# Patient Record
Sex: Female | Born: 1955 | Race: Black or African American | Hispanic: No | Smoking: Never smoker
Health system: Southern US, Community
[De-identification: ages and names within clinical notes are randomized; demographics above are authoritative.]

## PROBLEM LIST (undated history)

## (undated) DIAGNOSIS — K219 Gastro-esophageal reflux disease without esophagitis: Secondary | ICD-10-CM

## (undated) DIAGNOSIS — R0609 Other forms of dyspnea: Secondary | ICD-10-CM

## (undated) DIAGNOSIS — M549 Dorsalgia, unspecified: Secondary | ICD-10-CM

## (undated) DIAGNOSIS — R350 Frequency of micturition: Secondary | ICD-10-CM

## (undated) DIAGNOSIS — F341 Dysthymic disorder: Secondary | ICD-10-CM

## (undated) DIAGNOSIS — G8929 Other chronic pain: Secondary | ICD-10-CM

## (undated) DIAGNOSIS — M1990 Unspecified osteoarthritis, unspecified site: Secondary | ICD-10-CM

## (undated) DIAGNOSIS — E663 Overweight: Secondary | ICD-10-CM

## (undated) DIAGNOSIS — H1010 Acute atopic conjunctivitis, unspecified eye: Secondary | ICD-10-CM

## (undated) DIAGNOSIS — R600 Localized edema: Secondary | ICD-10-CM

## (undated) DIAGNOSIS — E785 Hyperlipidemia, unspecified: Secondary | ICD-10-CM

## (undated) DIAGNOSIS — R0600 Dyspnea, unspecified: Secondary | ICD-10-CM

## (undated) DIAGNOSIS — K5909 Other constipation: Secondary | ICD-10-CM

## (undated) DIAGNOSIS — I639 Cerebral infarction, unspecified: Secondary | ICD-10-CM

## (undated) DIAGNOSIS — I10 Essential (primary) hypertension: Secondary | ICD-10-CM

## (undated) HISTORY — DX: Frequency of micturition: R35.0

## (undated) HISTORY — DX: Dorsalgia, unspecified: M54.9

## (undated) HISTORY — DX: Other forms of dyspnea: R06.09

## (undated) HISTORY — PX: UPPER GASTROINTESTINAL ENDOSCOPY: SHX188

## (undated) HISTORY — DX: Gastro-esophageal reflux disease without esophagitis: K21.9

## (undated) HISTORY — DX: Essential (primary) hypertension: I10

## (undated) HISTORY — DX: Overweight: E66.3

## (undated) HISTORY — DX: Dyspnea, unspecified: R06.00

## (undated) HISTORY — DX: Unspecified osteoarthritis, unspecified site: M19.90

## (undated) HISTORY — DX: Other chronic pain: G89.29

## (undated) HISTORY — DX: Hyperlipidemia, unspecified: E78.5

## (undated) HISTORY — DX: Acute atopic conjunctivitis, unspecified eye: H10.10

## (undated) HISTORY — DX: Localized edema: R60.0

## (undated) HISTORY — DX: Other constipation: K59.09

## (undated) HISTORY — DX: Dysthymic disorder: F34.1

## (undated) HISTORY — DX: Cerebral infarction, unspecified: I63.9

---

## 1997-11-18 ENCOUNTER — Other Ambulatory Visit: Admission: RE | Admit: 1997-11-18 | Discharge: 1997-11-18 | Payer: Self-pay | Admitting: Nephrology

## 1998-01-15 ENCOUNTER — Emergency Department (HOSPITAL_COMMUNITY): Admission: EM | Admit: 1998-01-15 | Discharge: 1998-01-15 | Payer: Self-pay | Admitting: Emergency Medicine

## 1998-07-16 ENCOUNTER — Emergency Department (HOSPITAL_COMMUNITY): Admission: EM | Admit: 1998-07-16 | Discharge: 1998-07-16 | Payer: Self-pay

## 1998-09-15 ENCOUNTER — Emergency Department (HOSPITAL_COMMUNITY): Admission: EM | Admit: 1998-09-15 | Discharge: 1998-09-15 | Payer: Self-pay | Admitting: Emergency Medicine

## 1998-11-12 ENCOUNTER — Emergency Department (HOSPITAL_COMMUNITY): Admission: EM | Admit: 1998-11-12 | Discharge: 1998-11-12 | Payer: Self-pay | Admitting: Emergency Medicine

## 1999-02-22 ENCOUNTER — Encounter: Payer: Self-pay | Admitting: Urology

## 1999-02-23 ENCOUNTER — Ambulatory Visit (HOSPITAL_COMMUNITY): Admission: RE | Admit: 1999-02-23 | Discharge: 1999-02-23 | Payer: Self-pay | Admitting: Urology

## 1999-02-23 ENCOUNTER — Encounter: Payer: Self-pay | Admitting: Urology

## 1999-02-23 ENCOUNTER — Encounter (INDEPENDENT_AMBULATORY_CARE_PROVIDER_SITE_OTHER): Payer: Self-pay | Admitting: Specialist

## 1999-04-13 ENCOUNTER — Encounter: Admission: RE | Admit: 1999-04-13 | Discharge: 1999-04-13 | Payer: Self-pay | Admitting: Internal Medicine

## 1999-04-13 ENCOUNTER — Encounter: Admission: RE | Admit: 1999-04-13 | Discharge: 1999-04-13 | Payer: Self-pay | Admitting: Family Medicine

## 1999-04-13 ENCOUNTER — Ambulatory Visit (HOSPITAL_COMMUNITY): Admission: RE | Admit: 1999-04-13 | Discharge: 1999-04-13 | Payer: Self-pay | Admitting: Family Medicine

## 1999-04-17 ENCOUNTER — Inpatient Hospital Stay (HOSPITAL_COMMUNITY): Admission: AD | Admit: 1999-04-17 | Discharge: 1999-04-19 | Payer: Self-pay | Admitting: Cardiovascular Disease

## 1999-04-17 ENCOUNTER — Encounter: Payer: Self-pay | Admitting: Cardiovascular Disease

## 1999-04-18 ENCOUNTER — Encounter: Payer: Self-pay | Admitting: Cardiovascular Disease

## 1999-07-10 ENCOUNTER — Encounter: Admission: RE | Admit: 1999-07-10 | Discharge: 1999-07-28 | Payer: Self-pay | Admitting: Orthopedic Surgery

## 1999-07-11 ENCOUNTER — Emergency Department (HOSPITAL_COMMUNITY): Admission: EM | Admit: 1999-07-11 | Discharge: 1999-07-11 | Payer: Self-pay | Admitting: Emergency Medicine

## 1999-08-21 ENCOUNTER — Encounter: Admission: RE | Admit: 1999-08-21 | Discharge: 1999-08-21 | Payer: Self-pay | Admitting: Family Medicine

## 1999-09-07 ENCOUNTER — Encounter: Admission: RE | Admit: 1999-09-07 | Discharge: 1999-09-07 | Payer: Self-pay | Admitting: Family Medicine

## 1999-09-28 HISTORY — PX: ABDOMINAL HYSTERECTOMY: SHX81

## 1999-10-24 ENCOUNTER — Inpatient Hospital Stay (HOSPITAL_COMMUNITY): Admission: RE | Admit: 1999-10-24 | Discharge: 1999-10-26 | Payer: Self-pay | Admitting: *Deleted

## 1999-10-24 ENCOUNTER — Encounter (INDEPENDENT_AMBULATORY_CARE_PROVIDER_SITE_OTHER): Payer: Self-pay | Admitting: Specialist

## 2000-01-02 ENCOUNTER — Encounter: Admission: RE | Admit: 2000-01-02 | Discharge: 2000-01-02 | Payer: Self-pay | Admitting: Sports Medicine

## 2000-02-09 ENCOUNTER — Encounter: Admission: RE | Admit: 2000-02-09 | Discharge: 2000-02-09 | Payer: Self-pay | Admitting: Family Medicine

## 2000-03-28 ENCOUNTER — Encounter: Payer: Self-pay | Admitting: Emergency Medicine

## 2000-03-28 ENCOUNTER — Emergency Department (HOSPITAL_COMMUNITY): Admission: EM | Admit: 2000-03-28 | Discharge: 2000-03-28 | Payer: Self-pay | Admitting: Emergency Medicine

## 2000-04-25 ENCOUNTER — Encounter: Admission: RE | Admit: 2000-04-25 | Discharge: 2000-04-25 | Payer: Self-pay | Admitting: Family Medicine

## 2000-04-29 ENCOUNTER — Encounter (INDEPENDENT_AMBULATORY_CARE_PROVIDER_SITE_OTHER): Payer: Self-pay | Admitting: *Deleted

## 2000-04-29 LAB — CONVERTED CEMR LAB

## 2000-05-06 ENCOUNTER — Encounter: Admission: RE | Admit: 2000-05-06 | Discharge: 2000-05-06 | Payer: Self-pay | Admitting: Family Medicine

## 2000-05-20 ENCOUNTER — Other Ambulatory Visit: Admission: RE | Admit: 2000-05-20 | Discharge: 2000-05-20 | Payer: Self-pay | Admitting: Family Medicine

## 2000-05-20 ENCOUNTER — Encounter: Admission: RE | Admit: 2000-05-20 | Discharge: 2000-05-20 | Payer: Self-pay | Admitting: Family Medicine

## 2000-05-23 ENCOUNTER — Emergency Department (HOSPITAL_COMMUNITY): Admission: EM | Admit: 2000-05-23 | Discharge: 2000-05-23 | Payer: Self-pay | Admitting: Emergency Medicine

## 2000-07-09 ENCOUNTER — Ambulatory Visit: Admission: RE | Admit: 2000-07-09 | Discharge: 2000-07-09 | Payer: Self-pay | Admitting: Family Medicine

## 2000-07-09 ENCOUNTER — Encounter: Admission: RE | Admit: 2000-07-09 | Discharge: 2000-07-09 | Payer: Self-pay | Admitting: Family Medicine

## 2000-07-11 ENCOUNTER — Ambulatory Visit (HOSPITAL_COMMUNITY): Admission: RE | Admit: 2000-07-11 | Discharge: 2000-07-11 | Payer: Self-pay | Admitting: Family Medicine

## 2000-07-25 ENCOUNTER — Encounter: Payer: Self-pay | Admitting: Family Medicine

## 2000-07-25 ENCOUNTER — Ambulatory Visit (HOSPITAL_COMMUNITY): Admission: RE | Admit: 2000-07-25 | Discharge: 2000-07-25 | Payer: Self-pay | Admitting: Family Medicine

## 2000-07-31 ENCOUNTER — Ambulatory Visit: Admission: RE | Admit: 2000-07-31 | Discharge: 2000-07-31 | Payer: Self-pay | Admitting: Gynecology

## 2000-10-03 ENCOUNTER — Encounter: Admission: RE | Admit: 2000-10-03 | Discharge: 2000-10-03 | Payer: Self-pay | Admitting: Family Medicine

## 2000-10-07 ENCOUNTER — Encounter: Admission: RE | Admit: 2000-10-07 | Discharge: 2000-10-07 | Payer: Self-pay | Admitting: Family Medicine

## 2000-10-16 ENCOUNTER — Encounter: Admission: RE | Admit: 2000-10-16 | Discharge: 2000-10-16 | Payer: Self-pay | Admitting: Family Medicine

## 2000-10-17 ENCOUNTER — Encounter: Payer: Self-pay | Admitting: Family Medicine

## 2000-10-17 ENCOUNTER — Encounter: Admission: RE | Admit: 2000-10-17 | Discharge: 2000-10-17 | Payer: Self-pay | Admitting: Family Medicine

## 2000-11-08 ENCOUNTER — Ambulatory Visit (HOSPITAL_COMMUNITY): Admission: RE | Admit: 2000-11-08 | Discharge: 2000-11-08 | Payer: Self-pay | Admitting: Cardiovascular Disease

## 2000-11-08 ENCOUNTER — Encounter: Payer: Self-pay | Admitting: Cardiovascular Disease

## 2000-11-14 ENCOUNTER — Emergency Department (HOSPITAL_COMMUNITY): Admission: EM | Admit: 2000-11-14 | Discharge: 2000-11-14 | Payer: Self-pay | Admitting: Emergency Medicine

## 2000-11-14 ENCOUNTER — Encounter: Payer: Self-pay | Admitting: Emergency Medicine

## 2000-11-17 ENCOUNTER — Emergency Department (HOSPITAL_COMMUNITY): Admission: EM | Admit: 2000-11-17 | Discharge: 2000-11-17 | Payer: Self-pay | Admitting: Emergency Medicine

## 2001-01-15 ENCOUNTER — Encounter: Admission: RE | Admit: 2001-01-15 | Discharge: 2001-01-15 | Payer: Self-pay | Admitting: Family Medicine

## 2001-01-31 ENCOUNTER — Ambulatory Visit (HOSPITAL_COMMUNITY): Admission: RE | Admit: 2001-01-31 | Discharge: 2001-01-31 | Payer: Self-pay | Admitting: Family Medicine

## 2001-01-31 ENCOUNTER — Encounter: Payer: Self-pay | Admitting: Family Medicine

## 2001-02-03 ENCOUNTER — Emergency Department (HOSPITAL_COMMUNITY): Admission: EM | Admit: 2001-02-03 | Discharge: 2001-02-03 | Payer: Self-pay | Admitting: Emergency Medicine

## 2001-02-12 ENCOUNTER — Ambulatory Visit: Admission: RE | Admit: 2001-02-12 | Discharge: 2001-02-12 | Payer: Self-pay | Admitting: Gynecology

## 2001-02-14 ENCOUNTER — Encounter: Admission: RE | Admit: 2001-02-14 | Discharge: 2001-02-14 | Payer: Self-pay | Admitting: Family Medicine

## 2001-03-07 ENCOUNTER — Encounter: Admission: RE | Admit: 2001-03-07 | Discharge: 2001-03-07 | Payer: Self-pay | Admitting: Family Medicine

## 2001-03-18 ENCOUNTER — Encounter: Admission: RE | Admit: 2001-03-18 | Discharge: 2001-03-18 | Payer: Self-pay | Admitting: Family Medicine

## 2001-03-24 ENCOUNTER — Emergency Department (HOSPITAL_COMMUNITY): Admission: EM | Admit: 2001-03-24 | Discharge: 2001-03-24 | Payer: Self-pay | Admitting: Emergency Medicine

## 2001-03-30 HISTORY — PX: SMALL INTESTINE SURGERY: SHX150

## 2001-04-07 ENCOUNTER — Encounter: Admission: RE | Admit: 2001-04-07 | Discharge: 2001-04-07 | Payer: Self-pay | Admitting: Family Medicine

## 2001-04-08 ENCOUNTER — Encounter (INDEPENDENT_AMBULATORY_CARE_PROVIDER_SITE_OTHER): Payer: Self-pay | Admitting: Specialist

## 2001-04-08 ENCOUNTER — Inpatient Hospital Stay (HOSPITAL_COMMUNITY): Admission: RE | Admit: 2001-04-08 | Discharge: 2001-04-16 | Payer: Self-pay | Admitting: Gynecology

## 2001-05-07 ENCOUNTER — Encounter: Admission: RE | Admit: 2001-05-07 | Discharge: 2001-05-07 | Payer: Self-pay | Admitting: Family Medicine

## 2001-05-08 ENCOUNTER — Ambulatory Visit (HOSPITAL_COMMUNITY): Admission: RE | Admit: 2001-05-08 | Discharge: 2001-05-08 | Payer: Self-pay | Admitting: Gynecology

## 2001-05-08 HISTORY — PX: BILATERAL SALPINGOOPHORECTOMY: SHX1223

## 2001-06-03 ENCOUNTER — Ambulatory Visit (HOSPITAL_BASED_OUTPATIENT_CLINIC_OR_DEPARTMENT_OTHER): Admission: RE | Admit: 2001-06-03 | Discharge: 2001-06-03 | Payer: Self-pay | Admitting: Urology

## 2001-06-03 ENCOUNTER — Encounter (INDEPENDENT_AMBULATORY_CARE_PROVIDER_SITE_OTHER): Payer: Self-pay

## 2001-06-18 ENCOUNTER — Ambulatory Visit: Admission: RE | Admit: 2001-06-18 | Discharge: 2001-06-18 | Payer: Self-pay | Admitting: Gynecologic Oncology

## 2001-07-31 ENCOUNTER — Encounter: Admission: RE | Admit: 2001-07-31 | Discharge: 2001-07-31 | Payer: Self-pay | Admitting: Family Medicine

## 2001-08-12 ENCOUNTER — Encounter: Admission: RE | Admit: 2001-08-12 | Discharge: 2001-08-12 | Payer: Self-pay

## 2001-09-03 ENCOUNTER — Encounter: Admission: RE | Admit: 2001-09-03 | Discharge: 2001-09-03 | Payer: Self-pay | Admitting: Family Medicine

## 2001-09-10 ENCOUNTER — Encounter: Admission: RE | Admit: 2001-09-10 | Discharge: 2001-09-10 | Payer: Self-pay | Admitting: Family Medicine

## 2001-09-12 ENCOUNTER — Emergency Department (HOSPITAL_COMMUNITY): Admission: EM | Admit: 2001-09-12 | Discharge: 2001-09-12 | Payer: Self-pay | Admitting: Emergency Medicine

## 2001-09-16 ENCOUNTER — Emergency Department (HOSPITAL_COMMUNITY): Admission: EM | Admit: 2001-09-16 | Discharge: 2001-09-16 | Payer: Self-pay | Admitting: *Deleted

## 2001-09-16 ENCOUNTER — Encounter: Payer: Self-pay | Admitting: *Deleted

## 2001-09-30 ENCOUNTER — Encounter: Payer: Self-pay | Admitting: Otolaryngology

## 2001-09-30 ENCOUNTER — Encounter: Admission: RE | Admit: 2001-09-30 | Discharge: 2001-09-30 | Payer: Self-pay | Admitting: Otolaryngology

## 2001-10-01 ENCOUNTER — Ambulatory Visit (HOSPITAL_COMMUNITY): Admission: RE | Admit: 2001-10-01 | Discharge: 2001-10-01 | Payer: Self-pay | Admitting: Otolaryngology

## 2001-10-01 ENCOUNTER — Encounter (INDEPENDENT_AMBULATORY_CARE_PROVIDER_SITE_OTHER): Payer: Self-pay | Admitting: *Deleted

## 2001-10-27 ENCOUNTER — Ambulatory Visit (HOSPITAL_COMMUNITY): Admission: RE | Admit: 2001-10-27 | Discharge: 2001-10-28 | Payer: Self-pay | Admitting: Family Medicine

## 2001-11-18 ENCOUNTER — Encounter: Admission: RE | Admit: 2001-11-18 | Discharge: 2001-11-18 | Payer: Self-pay | Admitting: Internal Medicine

## 2001-12-12 ENCOUNTER — Emergency Department: Admission: EM | Admit: 2001-12-12 | Discharge: 2001-12-12 | Payer: Self-pay | Admitting: Internal Medicine

## 2001-12-22 ENCOUNTER — Emergency Department (HOSPITAL_COMMUNITY): Admission: EM | Admit: 2001-12-22 | Discharge: 2001-12-22 | Payer: Self-pay | Admitting: Emergency Medicine

## 2001-12-23 ENCOUNTER — Encounter: Admission: RE | Admit: 2001-12-23 | Discharge: 2001-12-23 | Payer: Self-pay | Admitting: *Deleted

## 2002-02-12 ENCOUNTER — Encounter: Admission: RE | Admit: 2002-02-12 | Discharge: 2002-02-12 | Payer: Self-pay | Admitting: Family Medicine

## 2002-02-24 ENCOUNTER — Encounter: Admission: RE | Admit: 2002-02-24 | Discharge: 2002-02-24 | Payer: Self-pay | Admitting: Internal Medicine

## 2002-02-27 ENCOUNTER — Encounter: Admission: RE | Admit: 2002-02-27 | Discharge: 2002-02-27 | Payer: Self-pay | Admitting: Family Medicine

## 2002-03-31 ENCOUNTER — Encounter: Admission: RE | Admit: 2002-03-31 | Discharge: 2002-03-31 | Payer: Self-pay | Admitting: Family Medicine

## 2002-05-28 ENCOUNTER — Encounter: Admission: RE | Admit: 2002-05-28 | Discharge: 2002-05-28 | Payer: Self-pay | Admitting: Family Medicine

## 2002-06-16 ENCOUNTER — Encounter: Admission: RE | Admit: 2002-06-16 | Discharge: 2002-06-16 | Payer: Self-pay | Admitting: Family Medicine

## 2002-07-15 ENCOUNTER — Encounter: Admission: RE | Admit: 2002-07-15 | Discharge: 2002-07-15 | Payer: Self-pay | Admitting: Family Medicine

## 2002-07-21 ENCOUNTER — Encounter: Payer: Self-pay | Admitting: Sports Medicine

## 2002-07-21 ENCOUNTER — Encounter: Admission: RE | Admit: 2002-07-21 | Discharge: 2002-07-21 | Payer: Self-pay | Admitting: Sports Medicine

## 2002-08-07 ENCOUNTER — Encounter: Admission: RE | Admit: 2002-08-07 | Discharge: 2002-08-07 | Payer: Self-pay | Admitting: Family Medicine

## 2002-08-13 ENCOUNTER — Encounter: Admission: RE | Admit: 2002-08-13 | Discharge: 2002-08-13 | Payer: Self-pay | Admitting: Family Medicine

## 2002-08-17 ENCOUNTER — Encounter: Admission: RE | Admit: 2002-08-17 | Discharge: 2002-08-17 | Payer: Self-pay | Admitting: Family Medicine

## 2002-09-27 ENCOUNTER — Emergency Department (HOSPITAL_COMMUNITY): Admission: EM | Admit: 2002-09-27 | Discharge: 2002-09-27 | Payer: Self-pay | Admitting: Emergency Medicine

## 2002-09-29 ENCOUNTER — Encounter: Admission: RE | Admit: 2002-09-29 | Discharge: 2002-09-29 | Payer: Self-pay | Admitting: Internal Medicine

## 2002-10-06 ENCOUNTER — Encounter: Admission: RE | Admit: 2002-10-06 | Discharge: 2002-10-06 | Payer: Self-pay | Admitting: Internal Medicine

## 2002-11-05 ENCOUNTER — Encounter: Admission: RE | Admit: 2002-11-05 | Discharge: 2002-11-05 | Payer: Self-pay | Admitting: Internal Medicine

## 2002-11-19 ENCOUNTER — Encounter: Admission: RE | Admit: 2002-11-19 | Discharge: 2002-11-19 | Payer: Self-pay | Admitting: Family Medicine

## 2002-11-20 ENCOUNTER — Encounter: Admission: RE | Admit: 2002-11-20 | Discharge: 2002-11-20 | Payer: Self-pay | Admitting: Internal Medicine

## 2002-12-30 ENCOUNTER — Encounter: Admission: RE | Admit: 2002-12-30 | Discharge: 2002-12-30 | Payer: Self-pay | Admitting: Family Medicine

## 2003-02-15 ENCOUNTER — Encounter: Admission: RE | Admit: 2003-02-15 | Discharge: 2003-02-15 | Payer: Self-pay | Admitting: Sports Medicine

## 2003-02-26 ENCOUNTER — Encounter: Admission: RE | Admit: 2003-02-26 | Discharge: 2003-02-26 | Payer: Self-pay | Admitting: Internal Medicine

## 2003-05-17 ENCOUNTER — Encounter: Admission: RE | Admit: 2003-05-17 | Discharge: 2003-05-17 | Payer: Self-pay | Admitting: Family Medicine

## 2003-08-10 ENCOUNTER — Encounter: Admission: RE | Admit: 2003-08-10 | Discharge: 2003-08-10 | Payer: Self-pay | Admitting: Sports Medicine

## 2003-08-13 ENCOUNTER — Encounter: Admission: RE | Admit: 2003-08-13 | Discharge: 2003-08-13 | Payer: Self-pay | Admitting: Family Medicine

## 2003-09-20 ENCOUNTER — Ambulatory Visit (HOSPITAL_COMMUNITY): Admission: RE | Admit: 2003-09-20 | Discharge: 2003-09-20 | Payer: Self-pay | Admitting: Cardiovascular Disease

## 2003-09-21 ENCOUNTER — Ambulatory Visit (HOSPITAL_BASED_OUTPATIENT_CLINIC_OR_DEPARTMENT_OTHER): Admission: RE | Admit: 2003-09-21 | Discharge: 2003-09-21 | Payer: Self-pay | Admitting: Cardiovascular Disease

## 2003-11-12 ENCOUNTER — Ambulatory Visit (HOSPITAL_BASED_OUTPATIENT_CLINIC_OR_DEPARTMENT_OTHER): Admission: RE | Admit: 2003-11-12 | Discharge: 2003-11-12 | Payer: Self-pay | Admitting: Surgery

## 2003-11-12 ENCOUNTER — Ambulatory Visit (HOSPITAL_COMMUNITY): Admission: RE | Admit: 2003-11-12 | Discharge: 2003-11-12 | Payer: Self-pay | Admitting: Surgery

## 2003-11-19 ENCOUNTER — Ambulatory Visit (HOSPITAL_COMMUNITY): Admission: RE | Admit: 2003-11-19 | Discharge: 2003-11-19 | Payer: Self-pay | Admitting: Sports Medicine

## 2003-11-23 ENCOUNTER — Ambulatory Visit: Admission: RE | Admit: 2003-11-23 | Discharge: 2003-11-23 | Payer: Self-pay | Admitting: Gynecology

## 2003-12-28 ENCOUNTER — Ambulatory Visit (HOSPITAL_BASED_OUTPATIENT_CLINIC_OR_DEPARTMENT_OTHER): Admission: RE | Admit: 2003-12-28 | Discharge: 2003-12-28 | Payer: Self-pay | Admitting: Cardiovascular Disease

## 2003-12-29 ENCOUNTER — Encounter: Admission: RE | Admit: 2003-12-29 | Discharge: 2003-12-29 | Payer: Self-pay | Admitting: Family Medicine

## 2003-12-29 HISTORY — PX: BREAST SURGERY: SHX581

## 2004-01-04 ENCOUNTER — Ambulatory Visit (HOSPITAL_COMMUNITY): Admission: RE | Admit: 2004-01-04 | Discharge: 2004-01-04 | Payer: Self-pay | Admitting: *Deleted

## 2004-01-06 ENCOUNTER — Encounter: Admission: RE | Admit: 2004-01-06 | Discharge: 2004-01-06 | Payer: Self-pay | Admitting: General Surgery

## 2004-01-06 ENCOUNTER — Encounter (INDEPENDENT_AMBULATORY_CARE_PROVIDER_SITE_OTHER): Payer: Self-pay | Admitting: *Deleted

## 2004-02-18 ENCOUNTER — Encounter: Admission: RE | Admit: 2004-02-18 | Discharge: 2004-02-18 | Payer: Self-pay | Admitting: Family Medicine

## 2004-03-17 ENCOUNTER — Encounter: Admission: RE | Admit: 2004-03-17 | Discharge: 2004-03-17 | Payer: Self-pay | Admitting: Family Medicine

## 2004-06-06 ENCOUNTER — Ambulatory Visit: Payer: Self-pay | Admitting: Family Medicine

## 2004-07-03 ENCOUNTER — Ambulatory Visit: Payer: Self-pay | Admitting: Family Medicine

## 2004-07-27 ENCOUNTER — Ambulatory Visit: Payer: Self-pay | Admitting: Family Medicine

## 2004-09-15 ENCOUNTER — Ambulatory Visit: Payer: Self-pay | Admitting: Family Medicine

## 2004-11-21 ENCOUNTER — Ambulatory Visit (HOSPITAL_COMMUNITY): Admission: RE | Admit: 2004-11-21 | Discharge: 2004-11-21 | Payer: Self-pay | Admitting: Family Medicine

## 2004-12-28 LAB — HM COLONOSCOPY: HM Colonoscopy: NORMAL

## 2005-01-02 ENCOUNTER — Ambulatory Visit (HOSPITAL_COMMUNITY): Admission: RE | Admit: 2005-01-02 | Discharge: 2005-01-02 | Payer: Self-pay | Admitting: Gastroenterology

## 2005-03-09 ENCOUNTER — Ambulatory Visit: Payer: Self-pay | Admitting: Family Medicine

## 2005-04-26 ENCOUNTER — Ambulatory Visit: Payer: Self-pay | Admitting: Family Medicine

## 2005-06-15 ENCOUNTER — Ambulatory Visit: Payer: Self-pay | Admitting: Family Medicine

## 2005-06-28 ENCOUNTER — Ambulatory Visit (HOSPITAL_COMMUNITY): Admission: RE | Admit: 2005-06-28 | Discharge: 2005-06-28 | Payer: Self-pay | Admitting: Family Medicine

## 2005-08-03 ENCOUNTER — Ambulatory Visit: Payer: Self-pay | Admitting: Family Medicine

## 2005-08-28 ENCOUNTER — Ambulatory Visit: Payer: Self-pay | Admitting: Sports Medicine

## 2005-09-10 ENCOUNTER — Ambulatory Visit: Payer: Self-pay | Admitting: Family Medicine

## 2005-09-17 ENCOUNTER — Emergency Department (HOSPITAL_COMMUNITY): Admission: EM | Admit: 2005-09-17 | Discharge: 2005-09-17 | Payer: Self-pay | Admitting: Emergency Medicine

## 2005-11-23 ENCOUNTER — Ambulatory Visit (HOSPITAL_COMMUNITY): Admission: RE | Admit: 2005-11-23 | Discharge: 2005-11-23 | Payer: Self-pay | Admitting: Family Medicine

## 2006-03-08 ENCOUNTER — Ambulatory Visit: Payer: Self-pay | Admitting: Family Medicine

## 2006-06-28 ENCOUNTER — Ambulatory Visit: Payer: Self-pay | Admitting: Family Medicine

## 2006-08-01 ENCOUNTER — Ambulatory Visit: Payer: Self-pay | Admitting: Family Medicine

## 2006-09-27 ENCOUNTER — Encounter (INDEPENDENT_AMBULATORY_CARE_PROVIDER_SITE_OTHER): Payer: Self-pay | Admitting: *Deleted

## 2006-10-09 ENCOUNTER — Encounter (INDEPENDENT_AMBULATORY_CARE_PROVIDER_SITE_OTHER): Payer: Self-pay | Admitting: Family Medicine

## 2006-10-09 ENCOUNTER — Ambulatory Visit: Payer: Self-pay | Admitting: Sports Medicine

## 2006-10-09 DIAGNOSIS — M545 Low back pain: Secondary | ICD-10-CM

## 2006-10-09 DIAGNOSIS — G8929 Other chronic pain: Secondary | ICD-10-CM

## 2006-10-09 DIAGNOSIS — I10 Essential (primary) hypertension: Secondary | ICD-10-CM | POA: Insufficient documentation

## 2006-10-09 DIAGNOSIS — R3915 Urgency of urination: Secondary | ICD-10-CM

## 2006-10-09 LAB — CONVERTED CEMR LAB
Bilirubin Urine: NEGATIVE
Calcium: 9.3 mg/dL (ref 8.4–10.5)
Chloride: 102 meq/L (ref 96–112)
Potassium: 3.9 meq/L (ref 3.5–5.3)
Urobilinogen, UA: 1
WBC Urine, dipstick: NEGATIVE

## 2006-10-16 ENCOUNTER — Encounter (INDEPENDENT_AMBULATORY_CARE_PROVIDER_SITE_OTHER): Payer: Self-pay | Admitting: Family Medicine

## 2006-11-11 ENCOUNTER — Telehealth: Payer: Self-pay | Admitting: *Deleted

## 2006-12-27 ENCOUNTER — Ambulatory Visit (HOSPITAL_COMMUNITY): Admission: RE | Admit: 2006-12-27 | Discharge: 2006-12-27 | Payer: Self-pay | Admitting: Family Medicine

## 2007-01-20 ENCOUNTER — Telehealth (INDEPENDENT_AMBULATORY_CARE_PROVIDER_SITE_OTHER): Payer: Self-pay | Admitting: *Deleted

## 2007-01-20 ENCOUNTER — Ambulatory Visit: Payer: Self-pay | Admitting: Sports Medicine

## 2007-01-20 LAB — CONVERTED CEMR LAB
Bilirubin Urine: NEGATIVE
Blood in Urine, dipstick: NEGATIVE
Glucose, Urine, Semiquant: NEGATIVE
Ketones, urine, test strip: NEGATIVE
Protein, U semiquant: NEGATIVE
Urobilinogen, UA: 0.2

## 2007-03-07 ENCOUNTER — Ambulatory Visit: Payer: Self-pay | Admitting: Family Medicine

## 2007-03-18 ENCOUNTER — Encounter: Admission: RE | Admit: 2007-03-18 | Discharge: 2007-03-18 | Payer: Self-pay | Admitting: Family Medicine

## 2007-04-17 ENCOUNTER — Telehealth (INDEPENDENT_AMBULATORY_CARE_PROVIDER_SITE_OTHER): Payer: Self-pay | Admitting: Family Medicine

## 2007-04-24 ENCOUNTER — Encounter (INDEPENDENT_AMBULATORY_CARE_PROVIDER_SITE_OTHER): Payer: Self-pay | Admitting: Family Medicine

## 2007-04-24 ENCOUNTER — Ambulatory Visit: Payer: Self-pay | Admitting: Family Medicine

## 2007-04-24 DIAGNOSIS — R079 Chest pain, unspecified: Secondary | ICD-10-CM

## 2007-04-24 LAB — CONVERTED CEMR LAB
AST: 27 units/L (ref 0–37)
Alkaline Phosphatase: 75 units/L (ref 39–117)
BUN: 11 mg/dL (ref 6–23)
CO2: 19 meq/L (ref 19–32)
Calcium: 9 mg/dL (ref 8.4–10.5)
Chloride: 105 meq/L (ref 96–112)
Cholesterol: 151 mg/dL (ref 0–200)
Creatinine, Ser: 0.88 mg/dL (ref 0.40–1.20)
Glucose, Bld: 99 mg/dL (ref 70–99)
LDL Cholesterol: 74 mg/dL (ref 0–99)
Total Bilirubin: 0.7 mg/dL (ref 0.3–1.2)
Total CHOL/HDL Ratio: 2.4
VLDL: 14 mg/dL (ref 0–40)

## 2007-04-27 ENCOUNTER — Encounter (INDEPENDENT_AMBULATORY_CARE_PROVIDER_SITE_OTHER): Payer: Self-pay | Admitting: Family Medicine

## 2007-05-06 ENCOUNTER — Encounter (INDEPENDENT_AMBULATORY_CARE_PROVIDER_SITE_OTHER): Payer: Self-pay | Admitting: Family Medicine

## 2007-05-12 ENCOUNTER — Encounter (INDEPENDENT_AMBULATORY_CARE_PROVIDER_SITE_OTHER): Payer: Self-pay | Admitting: Family Medicine

## 2007-05-16 ENCOUNTER — Telehealth: Payer: Self-pay | Admitting: *Deleted

## 2007-05-19 ENCOUNTER — Ambulatory Visit (HOSPITAL_COMMUNITY): Admission: RE | Admit: 2007-05-19 | Discharge: 2007-05-19 | Payer: Self-pay | Admitting: Family Medicine

## 2007-05-19 ENCOUNTER — Ambulatory Visit: Payer: Self-pay | Admitting: Family Medicine

## 2007-05-20 ENCOUNTER — Telehealth (INDEPENDENT_AMBULATORY_CARE_PROVIDER_SITE_OTHER): Payer: Self-pay | Admitting: Family Medicine

## 2007-05-21 ENCOUNTER — Encounter (INDEPENDENT_AMBULATORY_CARE_PROVIDER_SITE_OTHER): Payer: Self-pay | Admitting: Family Medicine

## 2007-05-28 ENCOUNTER — Encounter (INDEPENDENT_AMBULATORY_CARE_PROVIDER_SITE_OTHER): Payer: Self-pay | Admitting: Family Medicine

## 2007-06-04 ENCOUNTER — Telehealth: Payer: Self-pay | Admitting: *Deleted

## 2007-06-05 ENCOUNTER — Ambulatory Visit: Payer: Self-pay | Admitting: Family Medicine

## 2007-07-07 ENCOUNTER — Encounter (INDEPENDENT_AMBULATORY_CARE_PROVIDER_SITE_OTHER): Payer: Self-pay | Admitting: Family Medicine

## 2007-07-28 ENCOUNTER — Encounter (INDEPENDENT_AMBULATORY_CARE_PROVIDER_SITE_OTHER): Payer: Self-pay | Admitting: Family Medicine

## 2007-10-14 ENCOUNTER — Ambulatory Visit: Payer: Self-pay | Admitting: Family Medicine

## 2007-10-14 ENCOUNTER — Encounter (INDEPENDENT_AMBULATORY_CARE_PROVIDER_SITE_OTHER): Payer: Self-pay | Admitting: Family Medicine

## 2007-10-15 ENCOUNTER — Telehealth (INDEPENDENT_AMBULATORY_CARE_PROVIDER_SITE_OTHER): Payer: Self-pay | Admitting: Family Medicine

## 2007-10-15 LAB — CONVERTED CEMR LAB
Alkaline Phosphatase: 75 units/L (ref 39–117)
BUN: 13 mg/dL (ref 6–23)
Glucose, Bld: 89 mg/dL (ref 70–99)
MCHC: 31.2 g/dL (ref 30.0–36.0)
MCV: 87.2 fL (ref 78.0–100.0)
Potassium: 3.4 meq/L — ABNORMAL LOW (ref 3.5–5.3)
RBC: 4.37 M/uL (ref 3.87–5.11)
Total Protein: 7.8 g/dL (ref 6.0–8.3)
WBC: 6.4 10*3/uL (ref 4.0–10.5)

## 2007-10-16 ENCOUNTER — Ambulatory Visit: Payer: Self-pay | Admitting: Family Medicine

## 2007-12-02 ENCOUNTER — Encounter: Payer: Self-pay | Admitting: *Deleted

## 2007-12-03 ENCOUNTER — Telehealth: Payer: Self-pay | Admitting: *Deleted

## 2007-12-05 ENCOUNTER — Telehealth: Payer: Self-pay | Admitting: *Deleted

## 2008-01-09 ENCOUNTER — Ambulatory Visit (HOSPITAL_COMMUNITY): Admission: RE | Admit: 2008-01-09 | Discharge: 2008-01-09 | Payer: Self-pay | Admitting: Family Medicine

## 2008-02-06 ENCOUNTER — Encounter (INDEPENDENT_AMBULATORY_CARE_PROVIDER_SITE_OTHER): Payer: Self-pay | Admitting: Family Medicine

## 2008-02-06 ENCOUNTER — Ambulatory Visit: Payer: Self-pay | Admitting: Family Medicine

## 2008-02-06 DIAGNOSIS — K219 Gastro-esophageal reflux disease without esophagitis: Secondary | ICD-10-CM

## 2008-02-06 DIAGNOSIS — F341 Dysthymic disorder: Secondary | ICD-10-CM

## 2008-02-06 LAB — CONVERTED CEMR LAB
BUN: 15 mg/dL (ref 6–23)
CO2: 22 meq/L (ref 19–32)
Calcium: 9 mg/dL (ref 8.4–10.5)
Creatinine, Ser: 0.83 mg/dL (ref 0.40–1.20)
Glucose, Bld: 91 mg/dL (ref 70–99)
Sodium: 141 meq/L (ref 135–145)

## 2008-02-11 ENCOUNTER — Telehealth (INDEPENDENT_AMBULATORY_CARE_PROVIDER_SITE_OTHER): Payer: Self-pay | Admitting: Family Medicine

## 2008-02-23 ENCOUNTER — Telehealth: Payer: Self-pay | Admitting: *Deleted

## 2008-03-25 ENCOUNTER — Encounter (INDEPENDENT_AMBULATORY_CARE_PROVIDER_SITE_OTHER): Payer: Self-pay | Admitting: Family Medicine

## 2008-03-25 ENCOUNTER — Ambulatory Visit: Payer: Self-pay | Admitting: Family Medicine

## 2008-07-20 ENCOUNTER — Ambulatory Visit: Payer: Self-pay | Admitting: Family Medicine

## 2008-07-26 ENCOUNTER — Telehealth (INDEPENDENT_AMBULATORY_CARE_PROVIDER_SITE_OTHER): Payer: Self-pay | Admitting: Family Medicine

## 2008-09-10 ENCOUNTER — Encounter (INDEPENDENT_AMBULATORY_CARE_PROVIDER_SITE_OTHER): Payer: Self-pay | Admitting: Family Medicine

## 2008-09-10 ENCOUNTER — Ambulatory Visit: Payer: Self-pay | Admitting: Family Medicine

## 2008-09-10 DIAGNOSIS — H1045 Other chronic allergic conjunctivitis: Secondary | ICD-10-CM

## 2008-09-12 LAB — CONVERTED CEMR LAB
BUN: 12 mg/dL (ref 6–23)
CO2: 21 meq/L (ref 19–32)
Chloride: 106 meq/L (ref 96–112)
Sodium: 141 meq/L (ref 135–145)

## 2008-12-29 ENCOUNTER — Telehealth (INDEPENDENT_AMBULATORY_CARE_PROVIDER_SITE_OTHER): Payer: Self-pay | Admitting: *Deleted

## 2009-01-12 ENCOUNTER — Ambulatory Visit: Payer: Self-pay | Admitting: Family Medicine

## 2009-01-20 ENCOUNTER — Ambulatory Visit (HOSPITAL_COMMUNITY): Admission: RE | Admit: 2009-01-20 | Discharge: 2009-01-20 | Payer: Self-pay | Admitting: Family Medicine

## 2009-01-24 ENCOUNTER — Emergency Department (HOSPITAL_COMMUNITY): Admission: EM | Admit: 2009-01-24 | Discharge: 2009-01-24 | Payer: Self-pay | Admitting: Emergency Medicine

## 2009-02-10 ENCOUNTER — Ambulatory Visit: Payer: Self-pay | Admitting: Family Medicine

## 2009-02-10 ENCOUNTER — Encounter: Payer: Self-pay | Admitting: *Deleted

## 2009-02-10 DIAGNOSIS — K5909 Other constipation: Secondary | ICD-10-CM

## 2009-02-15 ENCOUNTER — Ambulatory Visit: Payer: Self-pay | Admitting: Family Medicine

## 2009-02-15 ENCOUNTER — Encounter: Payer: Self-pay | Admitting: Family Medicine

## 2009-02-23 LAB — CONVERTED CEMR LAB
HDL: 50 mg/dL (ref >39)
Triglycerides: 81 mg/dL (ref <150)

## 2009-02-24 ENCOUNTER — Telehealth: Payer: Self-pay | Admitting: Family Medicine

## 2009-03-02 ENCOUNTER — Ambulatory Visit: Payer: Self-pay | Admitting: Family Medicine

## 2009-04-01 ENCOUNTER — Encounter (INDEPENDENT_AMBULATORY_CARE_PROVIDER_SITE_OTHER): Payer: Self-pay | Admitting: *Deleted

## 2009-04-05 ENCOUNTER — Telehealth: Payer: Self-pay | Admitting: *Deleted

## 2009-04-07 ENCOUNTER — Ambulatory Visit: Payer: Self-pay | Admitting: Family Medicine

## 2009-04-25 ENCOUNTER — Telehealth: Payer: Self-pay | Admitting: Family Medicine

## 2009-04-29 ENCOUNTER — Encounter: Payer: Self-pay | Admitting: Family Medicine

## 2009-05-05 ENCOUNTER — Telehealth: Payer: Self-pay | Admitting: *Deleted

## 2009-05-05 ENCOUNTER — Ambulatory Visit: Payer: Self-pay | Admitting: Cardiology

## 2009-05-05 DIAGNOSIS — E663 Overweight: Secondary | ICD-10-CM | POA: Insufficient documentation

## 2009-05-09 ENCOUNTER — Telehealth: Payer: Self-pay | Admitting: *Deleted

## 2009-05-17 ENCOUNTER — Telehealth: Payer: Self-pay | Admitting: Family Medicine

## 2009-05-17 ENCOUNTER — Telehealth: Payer: Self-pay | Admitting: Cardiology

## 2009-05-18 ENCOUNTER — Telehealth (INDEPENDENT_AMBULATORY_CARE_PROVIDER_SITE_OTHER): Payer: Self-pay | Admitting: *Deleted

## 2009-05-19 ENCOUNTER — Ambulatory Visit: Payer: Self-pay | Admitting: Cardiology

## 2009-05-19 ENCOUNTER — Encounter (HOSPITAL_COMMUNITY): Admission: RE | Admit: 2009-05-19 | Discharge: 2009-07-27 | Payer: Self-pay | Admitting: Cardiology

## 2009-05-19 ENCOUNTER — Ambulatory Visit: Payer: Self-pay

## 2009-05-27 ENCOUNTER — Encounter: Payer: Self-pay | Admitting: Family Medicine

## 2009-05-27 ENCOUNTER — Ambulatory Visit: Payer: Self-pay | Admitting: Family Medicine

## 2009-05-27 DIAGNOSIS — R0989 Other specified symptoms and signs involving the circulatory and respiratory systems: Secondary | ICD-10-CM | POA: Insufficient documentation

## 2009-05-27 DIAGNOSIS — R0609 Other forms of dyspnea: Secondary | ICD-10-CM | POA: Insufficient documentation

## 2009-05-27 LAB — CONVERTED CEMR LAB
HCT: 38.1 % (ref 36.0–46.0)
MCHC: 32.3 g/dL (ref 30.0–36.0)
Pro B Natriuretic peptide (BNP): 22.8 pg/mL (ref 0.0–100.0)
RDW: 13.8 % (ref 11.5–15.5)
WBC: 6.2 10*3/uL (ref 4.0–10.5)

## 2009-05-31 ENCOUNTER — Telehealth: Payer: Self-pay | Admitting: Family Medicine

## 2009-06-01 ENCOUNTER — Telehealth: Payer: Self-pay | Admitting: Family Medicine

## 2009-06-02 ENCOUNTER — Encounter: Payer: Self-pay | Admitting: Family Medicine

## 2009-06-10 ENCOUNTER — Encounter: Payer: Self-pay | Admitting: Family Medicine

## 2009-06-10 ENCOUNTER — Ambulatory Visit: Payer: Self-pay | Admitting: Family Medicine

## 2009-06-10 LAB — CONVERTED CEMR LAB
BUN: 13 mg/dL (ref 6–23)
Calcium: 8.9 mg/dL (ref 8.4–10.5)
Creatinine, Ser: 1 mg/dL (ref 0.40–1.20)

## 2009-06-28 ENCOUNTER — Ambulatory Visit: Payer: Self-pay | Admitting: Family Medicine

## 2009-07-19 ENCOUNTER — Ambulatory Visit: Payer: Self-pay | Admitting: Family Medicine

## 2009-08-18 ENCOUNTER — Ambulatory Visit: Payer: Self-pay | Admitting: Family Medicine

## 2009-08-25 ENCOUNTER — Ambulatory Visit: Payer: Self-pay | Admitting: Family Medicine

## 2009-08-25 DIAGNOSIS — H5210 Myopia, unspecified eye: Secondary | ICD-10-CM

## 2009-08-25 DIAGNOSIS — M545 Low back pain: Secondary | ICD-10-CM

## 2009-08-29 ENCOUNTER — Telehealth: Payer: Self-pay | Admitting: Family Medicine

## 2009-09-02 ENCOUNTER — Encounter: Payer: Self-pay | Admitting: Family Medicine

## 2009-09-02 ENCOUNTER — Ambulatory Visit: Payer: Self-pay | Admitting: Family Medicine

## 2009-09-02 LAB — CONVERTED CEMR LAB
CO2: 22 meq/L (ref 19–32)
Glucose, Bld: 88 mg/dL (ref 70–99)
Sodium: 138 meq/L (ref 135–145)

## 2009-09-07 ENCOUNTER — Telehealth (INDEPENDENT_AMBULATORY_CARE_PROVIDER_SITE_OTHER): Payer: Self-pay | Admitting: *Deleted

## 2009-09-15 ENCOUNTER — Ambulatory Visit: Payer: Self-pay | Admitting: Family Medicine

## 2009-10-03 ENCOUNTER — Encounter: Admission: RE | Admit: 2009-10-03 | Discharge: 2010-01-01 | Payer: Self-pay | Admitting: Family Medicine

## 2009-11-02 ENCOUNTER — Ambulatory Visit: Payer: Self-pay | Admitting: Family Medicine

## 2009-11-08 ENCOUNTER — Telehealth: Payer: Self-pay | Admitting: *Deleted

## 2009-11-21 ENCOUNTER — Telehealth: Payer: Self-pay | Admitting: Family Medicine

## 2009-12-20 ENCOUNTER — Telehealth: Payer: Self-pay | Admitting: Family Medicine

## 2009-12-27 ENCOUNTER — Encounter: Payer: Self-pay | Admitting: Family Medicine

## 2010-01-26 ENCOUNTER — Ambulatory Visit (HOSPITAL_COMMUNITY): Admission: RE | Admit: 2010-01-26 | Discharge: 2010-01-26 | Payer: Self-pay | Admitting: Family Medicine

## 2010-01-26 LAB — HM MAMMOGRAPHY: HM Mammogram: NEGATIVE

## 2010-03-21 ENCOUNTER — Ambulatory Visit: Payer: Self-pay | Admitting: Family Medicine

## 2010-04-06 ENCOUNTER — Ambulatory Visit: Payer: Self-pay | Admitting: Family Medicine

## 2010-04-10 ENCOUNTER — Telehealth: Payer: Self-pay | Admitting: Family Medicine

## 2010-06-02 ENCOUNTER — Telehealth: Payer: Self-pay | Admitting: Family Medicine

## 2010-07-05 ENCOUNTER — Ambulatory Visit: Payer: Self-pay | Admitting: Family Medicine

## 2010-07-05 ENCOUNTER — Telehealth: Payer: Self-pay | Admitting: Family Medicine

## 2010-07-05 DIAGNOSIS — N760 Acute vaginitis: Secondary | ICD-10-CM | POA: Insufficient documentation

## 2010-07-05 LAB — CONVERTED CEMR LAB
Nitrite: NEGATIVE
Specific Gravity, Urine: >=1.03
pH: 5.5

## 2010-07-17 ENCOUNTER — Ambulatory Visit: Payer: Self-pay | Admitting: Family Medicine

## 2010-08-23 ENCOUNTER — Ambulatory Visit: Admission: RE | Admit: 2010-08-23 | Discharge: 2010-08-23 | Payer: Self-pay | Source: Home / Self Care

## 2010-08-23 DIAGNOSIS — L851 Acquired keratosis [keratoderma] palmaris et plantaris: Secondary | ICD-10-CM | POA: Insufficient documentation

## 2010-08-29 NOTE — Consult Note (Signed)
Summary: Dermatology   Dermatology   Imported By: Clydell Hakim 01/26/2010 16:16:48  _____________________________________________________________________  External Attachment:    Type:   Image     Comment:   External Document

## 2010-08-29 NOTE — Progress Notes (Signed)
Summary: Order Needed  Phone Note Call from Patient Call back at Home Phone [phone]   Caller: Patient Summary of Call: Pt has appt for mammogram on June 30 and needs order faxed over from [place] for this.  She is having it at [facility]. Initial call taken by: Clydell Hakim,  Dec 20, 2009 10:29 AM    entered an order in the computer for this.  please print and fax.  Ellery Plunk MD  Dec 21, 2009 6:24 PM  Appended Document: Order Needed    Clinical Lists Changes  Medications: Added new medication of MIRALAX  POWD (POLYETHYLENE GLYCOL 3350) 1 cap in 8 oz of water once daily or up to 4 times per day as needed for constipation - Signed Rx of MIRALAX  POWD (POLYETHYLENE GLYCOL 3350) 1 cap in 8 oz of water once daily or up to 4 times per day as needed for constipation;  #1 x 11;  Signed;  Entered by: Ellery Plunk MD;  Authorized by: Ellery Plunk MD;  Method used: [facility] [address] [phone]*, [address] [place], [place], [place]  [address], Ph: [phone] or [phone], Fax: [phone]    Prescriptions: MIRALAX  POWD (POLYETHYLENE GLYCOL 3350) 1 cap in 8 oz of water once daily or up to 4 times per day as needed for constipation  #1 x 11   Entered and Authorized by:   Ellery Plunk MD   Signed by:   Ellery Plunk MD on 12/23/2009   Method used:   [facility] to        [facility] [address] [phone]* (retail)       [address]       [place], [place]  [address]       Ph: [phone] or [phone]       Fax: [phone]   RxID:   [id number]

## 2010-08-29 NOTE — Assessment & Plan Note (Signed)
Summary: vaginal irritation   Vital Signs:  Patient profile:   55 year old female Weight:      202 pounds Temp:     98.7 degrees F oral Pulse rate:   78 / minute Pulse rhythm:   regular BP sitting:   164 / 90  (left arm) Cuff size:   regular  Vitals Entered By: Loralee Pacas CMA (July 05, 2010 8:49 AM)  Serial Vital Signs/Assessments:  Time      Position  BP       Pulse  Resp  Temp     By 9:08 AM             155/90                         Angelena Sole MD  CC: vaginal itching   Primary Care Provider:  Ellery Plunk MD  CC:  vaginal itching.  History of Present Illness: 1. Vaginal itching: - Pt has had this for about 1 week - She has had a yeast infection before and she thinks that it is that - She is not having any vaginal discharge or bleeding - Denies being sexually active  ROS: denies urinary frequency, dysuria, fevers.  She is complaining of chronic back pain but nothing acutely worse.  2. HTN - She is taking her medicines as prescribed. - She doesn't check her blood pressure at home regularly  ROS: denies chest pain, shortness of breath.  Current Medications (verified): 1)  Aspirin 81 Mg Tbec (Aspirin) .Marland Kitchen.. 1 By Mouth Daily 2)  Nitroglycerin 0.4 Mg Subl (Nitroglycerin) .... Place 1 Tablet Under Tongue As Directed 3)  Patanol 0.1 % Soln (Olopatadine Hcl) .Marland Kitchen.. 1 Drop Each Eye Two Times A Day As Needed For Allergy Symptoms - Disp 1 Bottle 4)  Carvedilol 25 Mg Tabs (Carvedilol) .... One By Mouth Two Times A Day 5)  Triamcinolone Acetonide 0.1 % Crea (Triamcinolone Acetonide) .... Use Sparingly Over Affected Areas Twice Daily 6)  Celexa 20 Mg Tabs (Citalopram Hydrobromide) .... Take One Daily For Depression 7)  Aciphex 20 Mg Tbec (Rabeprazole Sodium) .... Take One Daily 8)  Lisinopril 20 Mg Tabs (Lisinopril) .... Take One Daily  Allergies: 1)  Codeine Phosphate (Codeine Phosphate)  Past History:  Past Medical History: Reviewed history from 05/27/2009  and no changes required. Myoview 10/10- EF of 71%, normal Myoview 10/08 - normal (Dr. Allyson Sabal [facility]) TEE - No evidence ascending aortic aneurysm or significant AR - 6/05 Persantine Cardiolyte - no significant ischemia (low-risk) - 2/05 Cath - nl coronary arteries, nl LVF, ruptured sinus aneurism and mod AI - 11/16/2000 Echo - trivial AR EF 70% - 10/28/2000 ETT Adequate & Positive - 07/12/2000 --> Normal cath 2002 Cardiolyte - neg,  EF 59% - 03/31/1999 Alllergic conjunctivitis Asymetric R LE edema - Dopplers neg Frequent c/o dysuria- volume contracted in past, s/p trial Elmiron HIV NR 7/05 Surgical menopause  Social History: Reviewed history from 05/05/2009 and no changes required. Single.  Lives alone no ETOH/tob/drugs.  Unemployed.  Mother deceased 03/14/2023. Never smoked cigs  Physical Exam  General:  Vitals reviewed and rechecked.  Hypertensive.  comfortable appearing, no acute distress. Eyes:  vision grossly intact.  PERRL.  EOMI. Lungs:  Normal respiratory effort, chest expands symmetrically. Lungs are clear to auscultation, no crackles or wheezes. Heart:  Normal rate and regular rhythm. S1 and S2 normal without gallop, murmur, click, rub or other extra sounds. Abdomen:  soft,  non-tender, normal bowel sounds, no distention, no masses, and no guarding.   Genitalia:  Pt refuses genital exam or wet prep Extremities:  no LE edema Skin:  no rashes.   Psych:  not anxious appearing and not depressed appearing.     Impression & Recommendations:  Problem # 1:  UNSPECIFIED VAGINITIS AND VULVOVAGINITIS (ICD-616.10) Assessment New  Pt with trich on UA.  She refuses a genital exam or wet prep.  She would likely benefit from screening for other STDs including GC/Chlamydia.  Will treat her with one time dose of Flagyl.  Sent in prescription for Monistat before results were back.  Advised her that she didn't need to take that.  Her updated medication list for this problem includes:    Monistat 7 100  Mg Supp (Miconazole nitrate) ..... Insert 1 suppository at bedtime daily for 1 week    Metronidazole 500 Mg Tabs (Metronidazole) .Marland KitchenMarland KitchenMarland KitchenMarland Kitchen 4 tabs by mouth x 1  Orders: [facility]- Est  Level 4 ([address]409)  Problem # 2:  HYPERTENSION, BENIGN (ICD-401.1) Assessment: Deteriorated  Not at goal.  She states that she is taking her medicine as prescribed.  Has room to go up on her Lisinopril.  Will have her come back for appointment with PCP next week to address adjusting medications. Her updated medication list for this problem includes:    Carvedilol 25 Mg Tabs (Carvedilol) ..... One by mouth two times a day    Lisinopril 20 Mg Tabs (Lisinopril) .Marland Kitchen... Take one daily  Orders: [facility]- Est  Level 4 ([address]191)  Complete Medication List: 1)  Aspirin 81 Mg Tbec (Aspirin) .Marland Kitchen.. 1 by mouth daily 2)  Nitroglycerin 0.4 Mg Subl (Nitroglycerin) .... Place 1 tablet under tongue as directed 3)  Patanol 0.1 % Soln (Olopatadine hcl) .Marland Kitchen.. 1 drop each eye two times a day as needed for allergy symptoms - disp 1 bottle 4)  Carvedilol 25 Mg Tabs (Carvedilol) .... One by mouth two times a day 5)  Triamcinolone Acetonide 0.1 % Crea (Triamcinolone acetonide) .... Use sparingly over affected areas twice daily 6)  Celexa 20 Mg Tabs (Citalopram hydrobromide) .... Take one daily for depression 7)  Aciphex 20 Mg Tbec (Rabeprazole sodium) .... Take one daily 8)  Lisinopril 20 Mg Tabs (Lisinopril) .... Take one daily 9)  Monistat 7 100 Mg Supp (Miconazole nitrate) .... Insert 1 suppository at bedtime daily for 1 week 10)  Metronidazole 500 Mg Tabs (Metronidazole) .... 4 tabs by mouth x 1  Other Orders: Urinalysis-[facility] (00000)  Patient Instructions: 1)  We will treat you for trich 2)  I have sent in a prescription for flagyl 3)  If not better with this then please return to clinic and they will have to do a wet prep then 4)  I would also like for you to schedule an appointment in 1 week with your regular doctor to discuss your blood  pressure Prescriptions: METRONIDAZOLE 500 MG TABS (METRONIDAZOLE) 4 tabs by mouth x 1  #4 x 0   Entered and Authorized by:   Angelena Sole MD   Signed by:   Angelena Sole MD on 07/05/2010   Method used:   [facility] to        [facility] [address] [phone]* (retail)       [address]       [place], [place]  [address]       Ph: [phone] or [phone]  Fax: [phone]   RxID:   [id number] MONISTAT 7 100 MG SUPP (MICONAZOLE NITRATE) Insert 1 suppository at bedtime daily for 1 week  #7 x 0   Entered and Authorized by:   Angelena Sole MD   Signed by:   Angelena Sole MD on 07/05/2010   Method used:   Electronically to        [facility] [address] [phone]* (retail)       [address]       [place], [place]  [address]       Ph: [phone] or [phone]       Fax: [phone]   RxID:   [id number]    Orders Added: 1)  Urinalysis-[facility] [00000] 2)  [facility]- Est  Level 4 [01601]   Flu Vaccine Consent Questions:    Do you have a history of severe allergic reactions to this vaccine? no    Any prior history of allergic reactions to egg and/or gelatin? no    Do you have a sensitivity to the preservative Thimersol? no    Do you have a past history of Guillan-Barre Syndrome? no    Do you currently have an acute febrile illness? no    Have you ever had a severe reaction to latex? no    Vaccine information given and explained to patient? yes    Are you currently pregnant? no   Influenza Vaccine (to be given today)  Laboratory Results   Urine Tests  Date/Time Received: July 05, 2010 9:00 AM  Date/Time Reported: July 05, 2010 9:18 AM   Routine Urinalysis   Color: yellow Appearance: Clear Glucose: negative   (Normal Range: Negative) Bilirubin: negative   (Normal Range: Negative) Ketone: trace (5)   (Normal Range: Negative) Spec. Gravity: >=1.030   (Normal Range: 1.003-1.035) Blood: negative    (Normal Range: Negative) pH: 5.5   (Normal Range: 5.0-8.0) Protein: negative   (Normal Range: Negative) Urobilinogen: 0.2   (Normal Range: 0-1) Nitrite: negative   (Normal Range: Negative) Leukocyte Esterace: large   (Normal Range: Negative)  Urine Microscopic WBC/HPF: 5-10 RBC/HPF: occ Bacteria/HPF: 2+ Mucous/HPF: 3+ Epithelial/HPF: 5-15 Other: many trich    Comments: ...............test performed by......Marland KitchenBonnie A. [place], MLS (ASCP)cm

## 2010-08-29 NOTE — Progress Notes (Signed)
Summary: Ref [facility]  Phone Note Call from Patient Call back at [facility] Phone [phone]   Caller: Patient Summary of Call: Pt checking on status of referral to dermatologist. Initial call taken by: Clydell Hakim,  November 08, 2009 1:37 PM  Follow-up for Phone Call        notified of appointment time. Follow-up by: Theresia Lo RN,  November 08, 2009 3:30 PM

## 2010-08-29 NOTE — Progress Notes (Signed)
Summary: Rx Req  Phone Note Refill Request Call back at [facility] Phone [phone] Message from:  Patient  Refills Requested: Medication #1:  ACIPHEX 20 MG TBEC take one daily  Medication #2:  LISINOPRIL 20 MG TABS take one daily. [facility]  Initial call taken by: Clydell Hakim,  April 10, 2010 2:33 PM  Follow-up for Phone Call        meds refilled Follow-up by: Ellery Plunk MD,  April 10, 2010 2:39 PM    Prescriptions: ACIPHEX 20 MG TBEC (RABEPRAZOLE SODIUM) take one daily  #30 x 12   Entered and Authorized by:   Ellery Plunk MD   Signed by:   Ellery Plunk MD on 04/10/2010   Method used:   Electronically to        [facility] [address] [phone]* (retail)       [address]       [place], [place]  [address]       Ph: [phone] or [phone]       Fax: [phone]   RxID:   [id number]

## 2010-08-29 NOTE — Assessment & Plan Note (Signed)
Summary: rash/eo   Vital Signs:  Patient profile:   55 year old female Height:      62 inches Weight:      206.25 pounds BMI:     37.86 Temp:     98.7 degrees F oral Pulse rate:   82 / minute BP sitting:   135 / 77  (right arm)  Vitals Entered By: Terese Door (November 02, 2009 2:39 PM) CC: rash on face Is Patient Diabetic? No Pain Assessment Patient in pain? yes     Location: face Intensity: 8 Type: itchy   Primary Care Provider:  Ellery Plunk MD  CC:  rash on face.  History of Present Illness: Patient is here today for c/o rash on her face x 1 month.  States approximately one month ago, she used Triamcinolone on her face (prescribed for use on her body for itching) and broke out in rash.  She also switched to Dial soap to wash her face.  She denies any problems with acne.  Rash is primarily on cheeks, erythematous maculopapular, no drainage, no punctae.  Smaller similar lesions on chin.  No systemic symptoms of illness. Patient denies any new medications or changes in diet.  Habits & Providers  Alcohol-Tobacco-Diet     Tobacco Status: never  Allergies: 1)  Codeine Phosphate (Codeine Phosphate)  Physical Exam  General:  Well-developed,well-nourished,in no acute distress; alert,appropriate and cooperative throughout examination Head:  Normocephalic and atraumatic without obvious abnormalities. No apparent alopecia or balding. Cheeks and chin with erythematous maculopapular rash, mild.   Impression & Recommendations:  Problem # 1:  CONTACT DERMATITIS&OTHER ECZEMA DUE UNSPEC CAUSE (ICD-692.9) Possible reaction to Triamcinolone, although this is commonly prescribed for contact dermatitis and apparently was prescribed for patient for use on body.  Advised her to stop using Dial soap on her face as it is very drying and harsh on skin. Could switch to Cetaphil.  Avoid lotions or creams on the face for now. Patient requesting Dermatology referral as she has been dealing with  this for a month. Will refer.  Her updated medication list for this problem includes:    Triamcinolone Acetonide 0.1 % Crea (Triamcinolone acetonide) ..... Use sparingly over affected areas twice daily  Orders: Dermatology Referral (Derma) [facility]- Est Level  3 ([address])  Complete Medication List: 1)  Aspirin 81 Mg Tbec (Aspirin) .Marland Kitchen.. 1 by mouth daily 2)  Nitroglycerin 0.4 Mg Subl (Nitroglycerin) .... Place 1 tablet under tongue as directed 3)  Patanol 0.1 % Soln (Olopatadine hcl) .Marland Kitchen.. 1 drop each eye two times a day as needed for allergy symptoms - disp 1 bottle 4)  Carvedilol 25 Mg Tabs (Carvedilol) .... One by mouth two times a day 5)  Triamcinolone Acetonide 0.1 % Crea (Triamcinolone acetonide) .... Use sparingly over affected areas twice daily 6)  Celexa 20 Mg Tabs (Citalopram hydrobromide) .... Take one daily for depression 7)  Aciphex 20 Mg Tbec (Rabeprazole sodium) .... Take one daily 8)  Lisinopril 20 Mg Tabs (Lisinopril) .... Take one daily  Patient Instructions: 1)  A referral has been placed to Dr. Sandre Kitty - Dermatology.  You will be contacted regarding an appointment.

## 2010-08-29 NOTE — Progress Notes (Signed)
  Phone Note Outgoing Call   Call placed by: Angelena Sole MD,  July 05, 2010 10:13 AM Summary of Call: LM for patient about her results on the UA and that we have a reason for her vaginal irritation.  Told her that I sent in a prescription to treat this.  Advised her to call this clinic to discuss the results. Initial call taken by: Angelena Sole MD,  July 05, 2010 10:14 AM  Follow-up for Phone Call        [facility] informed that nurse will return call later. [phone] Follow-up by: Abundio Miu,  July 05, 2010 10:57 AM  Additional Follow-up for Phone Call Additional follow up Details #1::        Discussed with patient that she has Trich and that we need to treat her.  Explained how she could have got it.  She had no questions or concerns.  Precautions to return to clinic were given. Additional Follow-up by: Angelena Sole MD,  July 05, 2010 11:06 AM

## 2010-08-29 NOTE — Assessment & Plan Note (Signed)
Summary: back pain,tcb   Vital Signs:  Patient profile:   55 year old female Height:      62 inches Weight:      205 pounds BMI:     37.63 BSA:     1.93 Temp:     98.8 degrees F Pulse rate:   76 / minute BP sitting:   120 / 87  Vitals Entered By: Jone Baseman CMA (August 25, 2009 2:44 PM) CC: back pain Is Patient Diabetic? No Pain Assessment Patient in pain? yes     Location: back Intensity: 8   Primary Care Provider:  Ellery Plunk MD  CC:  back pain.  History of Present Illness: Back pain- x1 day.  Started while lifting bags and walking up stairs.  unable to characterize pain beyond "it hurts a lot".  denies weakness or numbness in legs or loss of bowel or bladder function.  has tried 800 of motrin and heat both x 1 with little help.    Habits & Providers  Alcohol-Tobacco-Diet     Tobacco Status: never  Current Medications (verified): 1)  Aspirin 81 Mg Tbec (Aspirin) .Marland Kitchen.. 1 By Mouth Daily 2)  Nitroglycerin 0.4 Mg Subl (Nitroglycerin) .... Place 1 Tablet Under Tongue As Directed 3)  Patanol 0.1 % Soln (Olopatadine Hcl) .Marland Kitchen.. 1 Drop Each Eye Two Times A Day As Needed For Allergy Symptoms - Disp 1 Bottle 4)  Carvedilol 25 Mg Tabs (Carvedilol) .... One By Mouth Two Times A Day 5)  Triamcinolone Acetonide 0.1 % Crea (Triamcinolone Acetonide) .... Use Sparingly Over Affected Areas Twice Daily 6)  Celexa 20 Mg Tabs (Citalopram Hydrobromide) .... Take One Daily For Depression 7)  Aciphex 20 Mg Tbec (Rabeprazole Sodium) .... Take One Daily 8)  Lisinopril 20 Mg Tabs (Lisinopril) .... Take One Daily  Allergies (verified): 1)  Codeine Phosphate (Codeine Phosphate)  Review of Systems  The patient denies anorexia and difficulty walking.    Physical Exam  General:  Well-developed,well-nourished,in no acute distress; alert,appropriate and cooperative throughout examination Msk:  normal ROM.     Detailed Back/Spine Exam  General:    Well-developed, well-nourished,  in no acute distress; alert and oriented x 3.  obese.    Gait:    Normal heel-toe gait pattern bilaterally.  able to stand on toes and heels  Lumbosacral Exam:  Inspection-deformity:    Normal    full forward flexion, hyperextension, right and left lateral bend Palpation-spinal tenderness:  Normal Sciatic Notch:    There is no sciatic notch tenderness.   Impression & Recommendations:  Problem # 1:  LOW BACK PAIN, ACUTE (ICD-724.2) Assessment New  With recent onset of symptoms, I am not sure what course this will take, but I expect that it will resolve soon.  Pt currently has full ROM with some pain.  gave her stretches to try, asked her to take motrin 800 q6-8 hours and try heat.  Advised her next step would be PT if this did not improve.   Her updated medication list for this problem includes:    Aspirin 81 Mg Tbec (Aspirin) .Marland Kitchen... 1 by mouth daily  Orders: [facility]- Est Level  3 (81[address])  Complete Medication List: 1)  Aspirin 81 Mg Tbec (Aspirin) .Marland Kitchen.. 1 by mouth daily 2)  Nitroglycerin 0.4 Mg Subl (Nitroglycerin) .... Place 1 tablet under tongue as directed 3)  Patanol 0.1 % Soln (Olopatadine hcl) .Marland Kitchen.. 1 drop each eye two times a day as needed for allergy symptoms - disp 1  bottle 4)  Carvedilol 25 Mg Tabs (Carvedilol) .... One by mouth two times a day 5)  Triamcinolone Acetonide 0.1 % Crea (Triamcinolone acetonide) .... Use sparingly over affected areas twice daily 6)  Celexa 20 Mg Tabs (Citalopram hydrobromide) .... Take one daily for depression 7)  Aciphex 20 Mg Tbec (Rabeprazole sodium) .... Take one daily 8)  Lisinopril 20 Mg Tabs (Lisinopril) .... Take one daily  Other Orders: Ophthalmology Referral (Ophthalmology)  Patient Instructions: 1)  Please use heat on your back for 20 minutes three times per day. 2)  Take motrin 800mg  every 6 hours for the next week. 3)  Try some lower back stretches 4)  The most important thing is to keep moving, don't just sit on your couch.  Try  to walk and stretch.

## 2010-08-29 NOTE — Assessment & Plan Note (Signed)
Summary: f/up,tcb   Vital Signs:  Patient profile:   55 year old female Height:      62 inches Weight:      206 pounds BMI:     37.81 BSA:     1.94 Temp:     98.4 degrees F Pulse rate:   74 / minute BP sitting:   146 / 84  Vitals Entered By: Jone Baseman CMA (August 18, 2009 1:39 PM)   Prevention & Chronic Care Immunizations   Influenza vaccine: Fluvax MCR  (05/27/2009)    Tetanus booster: 02/27/2002: Done.   Tetanus booster due: 02/28/2012    Pneumococcal vaccine: Not documented  Colorectal Screening   Hemoccult: Done.  (01/27/2006)   Hemoccult due: Not Indicated    Colonoscopy: Done.  (12/28/2004)   Colonoscopy due: 12/29/2014  Other Screening   Pap smear: Done.  (04/29/2000)   Pap smear due: Not Indicated    Mammogram: ASSESSMENT: Negative - BI-RADS 1^MM DIGITAL SCREENING  (01/20/2009)   Mammogram due: 01/20/2010    Smoking status: never  (08/18/2009)  Hypertension   Last Blood Pressure: 146 / 84  (08/18/2009)   Serum creatinine: 1.00  (06/10/2009)   Serum potassium 3.6  (06/10/2009)    Hypertension flowsheet reviewed?: Yes   Progress toward BP goal: Deteriorated  Lipids   Total Cholesterol: 143  (02/15/2009)   LDL: 77  (02/15/2009)   LDL Direct: Not documented   HDL: 50  (02/15/2009)   Triglycerides: 81  (02/15/2009)  Self-Management Support :   Personal Goals (by the next clinic visit) :      Personal blood pressure goal: 130/80  (04/07/2009)   Patient will work on the following items until the next clinic visit to reach self-care goals:     Medications and monitoring: take my medicines every day, check my blood pressure  (04/07/2009)    Hypertension self-management support: Written self-care plan  (07/19/2009) CC: BP and GERD Is Patient Diabetic? No Pain Assessment Patient in pain? yes     Location: head Intensity: 7   Primary Care Provider:  Ellery Plunk MD  CC:  BP and GERD.  History of Present Illness: BP- Pt reports  taking her coreg and HCTZ regularly and not missing doses.  However, she comes in with a 15 point list of "side effects" of her HCTZ.  These include a muscle spasm in her back, CP, SOB, unilateral arm swelling, among others.  Many of these pt will admit were present before HCTZ, which pt admitted when pressed.  When asked, pt cannot remember any side effects of the lisinopril when she was on it and is willing to try it again.    Exercise- pt is walking 1-2x/week for 15 min.  She continues to have CP when going up hill which resolves going down the hill.  Pt is nervous that this is a MI.    GERD- pt reports that she is feeling food caught in her throat and "throws up" half of her food with every meal.  She relates this to when she takes her omeprazole.  SHe says that this pill causes her to throw up her food.  She alternatively says that this is with every meal and then says it is only when she takes her pill.    Habits & Providers  Alcohol-Tobacco-Diet     Tobacco Status: never  Current Medications (verified): 1)  Aspirin 81 Mg Tbec (Aspirin) .Marland Kitchen.. 1 By Mouth Daily 2)  Nitroglycerin 0.4 Mg Subl (Nitroglycerin) .Marland KitchenMarland KitchenMarland Kitchen  Place 1 Tablet Under Tongue As Directed 3)  Patanol 0.1 % Soln (Olopatadine Hcl) .Marland Kitchen.. 1 Drop Each Eye Two Times A Day As Needed For Allergy Symptoms - Disp 1 Bottle 4)  Carvedilol 25 Mg Tabs (Carvedilol) .... One By Mouth Two Times A Day 5)  Triamcinolone Acetonide 0.1 % Crea (Triamcinolone Acetonide) .... Use Sparingly Over Affected Areas Twice Daily 6)  Miralax  Powd (Polyethylene Glycol 3350) .... Take 17grams in A Full Glass of Water Daily 7)  Celexa 20 Mg Tabs (Citalopram Hydrobromide) .... Take One Daily For Depression 8)  Aciphex 20 Mg Tbec (Rabeprazole Sodium) .... Take One Daily 9)  Lisinopril 20 Mg Tabs (Lisinopril) .... Take One Daily  Allergies (verified): 1)  Codeine Phosphate (Codeine Phosphate)  Review of Systems  The patient denies severe indigestion/heartburn.      Physical Exam  General:  alert, well-developed, well-nourished, and overweight-appearing.   Head:  normocephalic and atraumatic.   Lungs:  normal respiratory effort and normal breath sounds.   Heart:  normal rate and regular rhythm.   Abdomen:  soft and non-tender.     Impression & Recommendations:  Problem # 1:  GERD (ICD-530.81) Assessment Deteriorated  Pt reports throwing up her food.  I will consider this a fail of her omeprazole.  Pt desires to be on aciphex.  Pt has gained weight.  If pt reports weight loss with continues vomitting, a GI referral may be necessary.  As of now, the story is inconsistent and she has not experienced any weight loss.  will follow closely.   The following medications were removed from the medication list:    Omeprazole 40 Mg Cpdr (Omeprazole) .Marland Kitchen... Take one per day Her updated medication list for this problem includes:    Aciphex 20 Mg Tbec (Rabeprazole sodium) .Marland Kitchen... Take one daily  Orders: [facility]- Est Level  3 ([address])  Problem # 2:  HYPERTENSION, BENIGN (ICD-401.1) Assessment: Deteriorated  Pt reports that she is taking her medicaitons but BP is higher.  Will switch her back to lisinopril.  Pt to return for BP check, side effect check, and lab work.  WIll check a BMET at that time.  Will see pt back in 3 months. The following medications were removed from the medication list:    Hydrochlorothiazide 12.5 Mg Caps (Hydrochlorothiazide) .Marland Kitchen... Take one daily Her updated medication list for this problem includes:    Carvedilol 25 Mg Tabs (Carvedilol) ..... One by mouth two times a day    Lisinopril 20 Mg Tabs (Lisinopril) .Marland Kitchen... Take one daily  Orders: [facility]- Est Level  3 ([address]213)Future Orders: [facility] ([id number]) ... 08/24/2010  Complete Medication List: 1)  Aspirin 81 Mg Tbec (Aspirin) .Marland Kitchen.. 1 by mouth daily 2)  Nitroglycerin 0.4 Mg Subl (Nitroglycerin) .... Place 1 tablet under tongue as directed 3)  Patanol 0.1 % Soln (Olopatadine hcl) .Marland Kitchen..  1 drop each eye two times a day as needed for allergy symptoms - disp 1 bottle 4)  Carvedilol 25 Mg Tabs (Carvedilol) .... One by mouth two times a day 5)  Triamcinolone Acetonide 0.1 % Crea (Triamcinolone acetonide) .... Use sparingly over affected areas twice daily 6)  Miralax Powd (Polyethylene glycol 3350) .... Take 17grams in a full glass of water daily 7)  Celexa 20 Mg Tabs (Citalopram hydrobromide) .... Take one daily for depression 8)  Aciphex 20 Mg Tbec (Rabeprazole sodium) .... Take one daily 9)  Lisinopril 20 Mg Tabs (Lisinopril) .... Take one daily  Patient Instructions: 1)  Please schedule a follow-up appointment in 3 months .  2)  please come back for a nurse visit and a blood test in 2 weeks.  You can schedule this at the desk.  3)  You set a goal today of walking 15 min 3 days per week.  Great goal!   Prescriptions: LISINOPRIL 20 MG TABS (LISINOPRIL) take one daily  #30 x 2   Entered and Authorized by:   Ellery Plunk MD   Signed by:   Ellery Plunk MD on 08/18/2009   Method used:   [facility]        [facility] [address] [phone]* (retail)       [address]       [place], [place]  [address]       Ph: [phone] or [phone]       Fax: [phone]   RxID:   [id number] ACIPHEX 20 MG TBEC (RABEPRAZOLE SODIUM) take one daily  #30 x 6   Entered and Authorized by:   Ellery Plunk MD   Signed by:   Ellery Plunk MD on 08/18/2009   Method used:   [facility]        [facility] [address] [phone]* (retail)       [address]       [place], [place]  [address]       Ph: [phone] or [phone]       Fax: [phone]   RxID:   [id number]

## 2010-08-29 NOTE — Assessment & Plan Note (Signed)
Summary: pain down back and leg,tcb   Vital Signs:  Patient profile:   55 year old female Height:      62 inches Weight:      201 pounds BMI:     36.90 Temp:     98.8 degrees F oral Pulse rate:   77 / minute BP sitting:   153 / 80  (left arm) Cuff size:   regular  Vitals Entered By: Tessie Fass CMA (April 06, 2010 2:17 PM) CC: lower back pain Pain Assessment Patient in pain? yes     Location: lower back Intensity: 5   Primary Care Provider:  Ellery Plunk MD  CC:  lower back pain.  History of Present Illness: Mrs. Mahaffy presents with 1 week history of right low back pain.  The pain had an insidious onset with no pre-disposing injury or event.  The pain is constant and has been stable - non-radiating and cramping in nature.  No change in bowel or bladder habits.  No radicular pains, no LE numbness or tingling.  No LE weakness.  Advil has taken the edge off but has not gotten rid of her pain.  Heat has made it worse; has not tried ice.    Current Medications (verified): 1)  Aspirin 81 Mg Tbec (Aspirin) .Marland Kitchen.. 1 By Mouth Daily 2)  Nitroglycerin 0.4 Mg Subl (Nitroglycerin) .... Place 1 Tablet Under Tongue As Directed 3)  Patanol 0.1 % Soln (Olopatadine Hcl) .Marland Kitchen.. 1 Drop Each Eye Two Times A Day As Needed For Allergy Symptoms - Disp 1 Bottle 4)  Carvedilol 25 Mg Tabs (Carvedilol) .... One By Mouth Two Times A Day 5)  Triamcinolone Acetonide 0.1 % Crea (Triamcinolone Acetonide) .... Use Sparingly Over Affected Areas Twice Daily 6)  Celexa 20 Mg Tabs (Citalopram Hydrobromide) .... Take One Daily For Depression 7)  Aciphex 20 Mg Tbec (Rabeprazole Sodium) .... Take One Daily 8)  Lisinopril 20 Mg Tabs (Lisinopril) .... Take One Daily  Allergies (verified): 1)  Codeine Phosphate (Codeine Phosphate)  Review of Systems  The patient denies anorexia, weight loss, chest pain, syncope, and abdominal pain.    Physical Exam  General:  Well-developed,well-nourished,in no acute  distress; alert,appropriate and cooperative throughout examination Lungs:  Normal respiratory effort, chest expands symmetrically. Lungs are clear to auscultation, no crackles or wheezes. Heart:  Normal rate and regular rhythm. S1 and S2 normal without gallop, murmur, click, rub or other extra sounds. Msk:  normal LE ROM, 5+/5 B LE strength, 2+/4 B LE DTRs; LE sensation intact to gross testing; Lumbar tenderness and right L2-L4 ropy paraspinal muscles.      Impression & Recommendations:  Problem # 1:  LOW BACK PAIN, ACUTE (ICD-724.2) Assessment Deteriorated low back pain, likely benign, msk in nature.  no red flagsPt provided with lumbar osteopathic manipulation per medical student which was tolerated well.  Pt provided instructions for Ice and stretching.  She can continue OTC mobic/ibprofen 400mg  by mouth three times a day as needed.    Her updated medication list for this problem includes:    Aspirin 81 Mg Tbec (Aspirin) .Marland Kitchen... 1 by mouth daily  Orders: [facility]- Est Level  3 ([address])  Complete Medication List: 1)  Aspirin 81 Mg Tbec (Aspirin) .Marland Kitchen.. 1 by mouth daily 2)  Nitroglycerin 0.4 Mg Subl (Nitroglycerin) .... Place 1 tablet under tongue as directed 3)  Patanol 0.1 % Soln (Olopatadine hcl) .Marland Kitchen.. 1 drop each eye two times a day as needed for allergy symptoms - disp  1 bottle 4)  Carvedilol 25 Mg Tabs (Carvedilol) .... One by mouth two times a day 5)  Triamcinolone Acetonide 0.1 % Crea (Triamcinolone acetonide) .... Use sparingly over affected areas twice daily 6)  Celexa 20 Mg Tabs (Citalopram hydrobromide) .... Take one daily for depression 7)  Aciphex 20 Mg Tbec (Rabeprazole sodium) .... Take one daily 8)  Lisinopril 20 Mg Tabs (Lisinopril) .... Take one daily  Patient Instructions: 1)  use ice 20 min 3x/ day and motrin 400mg  every 8 hours for the next few days.  please come back if this doesnt get better in the next 1-2 weeks. 2)  keep up your activity.

## 2010-08-29 NOTE — Assessment & Plan Note (Signed)
Summary: f/u,df   Vital Signs:  Patient profile:   55 year old female Height:      62 inches Weight:      206.4 pounds BMI:     37.89 Temp:     98.6 degrees F oral Pulse rate:   77 / minute BP sitting:   150 / 88  (left arm) Cuff size:   regular  Vitals Entered By: Garen Grams LPN (March 21, 2010 1:47 PM) CC: f/u bp Is Patient Diabetic? No Pain Assessment Patient in pain? no        Primary Care Shakeel Disney:  Ellery Plunk MD  CC:  f/u bp.  History of Present Illness: pt here for f/u of BP.  misses doses 1x/week.  no exercise, is not eating well per pt though reports only eating fried foods 1x/week.    Habits & Providers  Alcohol-Tobacco-Diet     Tobacco Status: never  Current Medications (verified): 1)  Aspirin 81 Mg Tbec (Aspirin) .Marland Kitchen.. 1 By Mouth Daily 2)  Nitroglycerin 0.4 Mg Subl (Nitroglycerin) .... Place 1 Tablet Under Tongue As Directed 3)  Patanol 0.1 % Soln (Olopatadine Hcl) .Marland Kitchen.. 1 Drop Each Eye Two Times A Day As Needed For Allergy Symptoms - Disp 1 Bottle 4)  Carvedilol 25 Mg Tabs (Carvedilol) .... One By Mouth Two Times A Day 5)  Triamcinolone Acetonide 0.1 % Crea (Triamcinolone Acetonide) .... Use Sparingly Over Affected Areas Twice Daily 6)  Celexa 20 Mg Tabs (Citalopram Hydrobromide) .... Take One Daily For Depression 7)  Aciphex 20 Mg Tbec (Rabeprazole Sodium) .... Take One Daily 8)  Lisinopril 20 Mg Tabs (Lisinopril) .... Take One Daily  Allergies (verified): 1)  Codeine Phosphate (Codeine Phosphate)  Review of Systems  The patient denies anorexia, chest pain, syncope, and abdominal pain.    Physical Exam  General:  Well-developed,well-nourished,in no acute distress; alert,appropriate and cooperative throughout examination Lungs:  Normal respiratory effort, chest expands symmetrically. Lungs are clear to auscultation, no crackles or wheezes. Heart:  Normal rate and regular rhythm. S1 and S2 normal without gallop, murmur, click, rub or other  extra sounds. Abdomen:  Bowel sounds positive,abdomen soft and non-tender without masses, organomegaly or hernias noted. Extremities:  R>L pedal edema   Impression & Recommendations:  Problem # 1:  HYPERTENSION, BENIGN (ICD-401.1) Assessment Unchanged pt will try to take meds as perscribed and increase walking to 3x/week as well as improve diet RTC in one month.  if BP not improved, consider increasing BP meds.  stay the same for now Her updated medication list for this problem includes:    Carvedilol 25 Mg Tabs (Carvedilol) ..... One by mouth two times a day    Lisinopril 20 Mg Tabs (Lisinopril) .Marland Kitchen... Take one daily  Orders: [facility]- Est Level  3 ([address])  Complete Medication List: 1)  Aspirin 81 Mg Tbec (Aspirin) .Marland Kitchen.. 1 by mouth daily 2)  Nitroglycerin 0.4 Mg Subl (Nitroglycerin) .... Place 1 tablet under tongue as directed 3)  Patanol 0.1 % Soln (Olopatadine hcl) .Marland Kitchen.. 1 drop each eye two times a day as needed for allergy symptoms - disp 1 bottle 4)  Carvedilol 25 Mg Tabs (Carvedilol) .... One by mouth two times a day 5)  Triamcinolone Acetonide 0.1 % Crea (Triamcinolone acetonide) .... Use sparingly over affected areas twice daily 6)  Celexa 20 Mg Tabs (Citalopram hydrobromide) .... Take one daily for depression 7)  Aciphex 20 Mg Tbec (Rabeprazole sodium) .... Take one daily 8)  Lisinopril 20 Mg Tabs (Lisinopril) .Marland KitchenMarland KitchenMarland Kitchen  Take one daily  Patient Instructions: 1)  Please schedule a follow-up appointment in 1 month.  2)  your goal is 30 minutes of walking 3 times a week 3)  find some compression hose at the drugstore and see if that helps swelling 4)  try to take your medications every day  Prevention & Chronic Care Immunizations   Influenza vaccine: Fluvax MCR  (05/27/2009)    Tetanus booster: 02/27/2002: Done.   Tetanus booster due: 02/28/2012    Pneumococcal vaccine: Not documented  Colorectal Screening   Hemoccult: Done.  (01/27/2006)   Hemoccult due: Not Indicated     Colonoscopy: Done.  (12/28/2004)   Colonoscopy due: 12/29/2014  Other Screening   Pap smear: Done.  (04/29/2000)   Pap smear due: Not Indicated    Mammogram: ASSESSMENT: Negative - BI-RADS 1^MM DIGITAL SCREENING  (01/26/2010)   Mammogram due: 01/20/2010   Smoking status: never  (03/21/2010)  Lipids   Total Cholesterol: 143  (02/15/2009)   LDL: 77  (02/15/2009)   LDL Direct: Not documented   HDL: 50  (02/15/2009)   Triglycerides: 81  (02/15/2009)  Hypertension   Last Blood Pressure: 150 / 88  (03/21/2010)   Serum creatinine: 0.79  (09/02/2009)   Serum potassium 4.0  (09/02/2009)   Basic metabolic panel due: 09/21/2010    Hypertension flowsheet reviewed?: Yes   Progress toward BP goal: Unchanged    Stage of readiness to change (hypertension management): Contemplation  Self-Management Support :   Personal Goals (by the next clinic visit) :      Personal blood pressure goal: 140/90  (03/21/2010)   Patient will work on the following items until the next clinic visit to reach self-care goals:     Medications and monitoring: take my medicines every day, bring all of my medications to every visit  (03/21/2010)     Eating: drink diet soda or water instead of juice or soda, eat more vegetables  (03/21/2010)     Activity: take a 30 minute walk every day  (03/21/2010)    Hypertension self-management support: Written self-care plan  (03/21/2010)   Hypertension self-care plan printed.

## 2010-08-29 NOTE — Progress Notes (Signed)
Summary: triage  Phone Note Call from Patient Call back at Home Phone [phone]   Caller: Patient Summary of Call: Pt says everytimes she eats she feels like she wants to spit up. Initial call taken by: Clydell Hakim,  November 21, 2009 4:53 PM  Follow-up for Phone Call        Returned call, got VM.  Left message to call us back. Follow-up by: Dennison Nancy RN,  November 22, 2009 9:09 AM  Additional Follow-up for Phone Call Additional follow up Details #1::        LM Additional Follow-up by: Golden Circle RN,  November 22, 2009 10:27 AM    Additional Follow-up for Phone Call Additional follow up Details #2::    will wait for her to call back Follow-up by: Golden Circle RN,  November 22, 2009 3:28 PM

## 2010-08-29 NOTE — Progress Notes (Signed)
Summary: Rx Req  Phone Note Call from Patient Call back at [facility] Phone [phone]   Caller: Patient Summary of Call: Has pain in her back and going down her legs.  Wondering if she can have something called in for pain.  [facility]. Initial call taken by: Clydell Hakim,  September 07, 2009 9:47 AM  Follow-up for Phone Call        will forward message to MD. Follow-up by: Theresia Lo RN,  September 07, 2009 10:36 AM  Additional Follow-up for Phone Call Additional follow up Details #1::        told pt the next step would be physical therapy.   she should come in to evaluated for referral Additional Follow-up by: Ellery Plunk MD,  September 07, 2009 7:03 PM    Additional Follow-up for Phone Call Additional follow up Details #2::    message left to return call. Follow-up by: Theresia Lo RN,  September 08, 2009 9:32 AM  Additional Follow-up for Phone Call Additional follow up Details #3:: Details for Additional Follow-up Action Taken: patient notified and advised to call for appointment. Additional Follow-up by: Theresia Lo RN,  September 08, 2009 11:00 AM

## 2010-08-29 NOTE — Progress Notes (Signed)
Summary: triage  Phone Note Call from Patient Call back at [facility] Phone [phone]   Caller: Patient Summary of Call: Pt says she was to get rx for back pain when she was seen last week also a list of eye doctors that she could see. Initial call taken by: Clydell Hakim,  August 29, 2009 9:50 AM  Follow-up for Phone Call        told her md wrote ibuprofen 800 mg. told her they were OTC & she needed to take 4 200mg  to make it up to 800 mg. told her to take with food to avoid stomach pain told her  she has an appt at [facility] with Dr. Abel Presto. gave address & phone number Follow-up by: Golden Circle RN,  August 29, 2009 9:57 AM

## 2010-08-29 NOTE — Progress Notes (Signed)
  Phone Note Refill Request   Refills Requested: Medication #1:  CARVEDILOL 25 MG TABS one by mouth two times a day Please call in refill  Initial call taken by: Abundio Miu,  June 02, 2010 4:06 PM  Follow-up for Phone Call        Rx faxed to pharmacy Follow-up by: Ellery Plunk MD,  June 05, 2010 10:15 AM    Prescriptions: CARVEDILOL 25 MG TABS (CARVEDILOL) one by mouth two times a day  #60 x 11   Entered and Authorized by:   Ellery Plunk MD   Signed by:   Ellery Plunk MD on 06/05/2010   Method used:   [facility] to        [facility] [address] [phone]* (retail)       [address]       [place], [place]  [address]       Ph: [phone] or [phone]       Fax: [phone]   RxID:   [id number]

## 2010-08-29 NOTE — Assessment & Plan Note (Signed)
Summary: f/u,df   Vital Signs:  Patient profile:   55 year old female Height:      62 inches Weight:      204.1 pounds BMI:     37.47 Temp:     98.5 degrees F oral Pulse rate:   80 / minute BP sitting:   138 / 87  (left arm) Cuff size:   regular  Vitals Entered By: Garen Grams LPN (September 15, 2009 2:46 PM) CC: f/u bp & bloodwork Is Patient Diabetic? No Pain Assessment Patient in pain? yes     Location: legs   Primary Care Provider:  Ellery Plunk MD  CC:  f/u bp & bloodwork.  History of Present Illness: Pt presents with complaint of back pain.  this is her chronic pain but it getting slightly worse.  She had tried motrin and heat with some relief.  she was referred to PT before but didn't go.  she is interested now.  She has some minor swelling in her ankles, which is chronic for her.  she denies any change in strength or balance, or any loss of sensation.    Habits & Providers  Alcohol-Tobacco-Diet     Tobacco Status: never  Current Medications (verified): 1)  Aspirin 81 Mg Tbec (Aspirin) .Marland Kitchen.. 1 By Mouth Daily 2)  Nitroglycerin 0.4 Mg Subl (Nitroglycerin) .... Place 1 Tablet Under Tongue As Directed 3)  Patanol 0.1 % Soln (Olopatadine Hcl) .Marland Kitchen.. 1 Drop Each Eye Two Times A Day As Needed For Allergy Symptoms - Disp 1 Bottle 4)  Carvedilol 25 Mg Tabs (Carvedilol) .... One By Mouth Two Times A Day 5)  Triamcinolone Acetonide 0.1 % Crea (Triamcinolone Acetonide) .... Use Sparingly Over Affected Areas Twice Daily 6)  Celexa 20 Mg Tabs (Citalopram Hydrobromide) .... Take One Daily For Depression 7)  Aciphex 20 Mg Tbec (Rabeprazole Sodium) .... Take One Daily 8)  Lisinopril 20 Mg Tabs (Lisinopril) .... Take One Daily  Allergies (verified): 1)  Codeine Phosphate (Codeine Phosphate)  Review of Systems  The patient denies anorexia, fever, weight loss, incontinence, muscle weakness, and difficulty walking.    Physical Exam  General:  Well-developed,well-nourished,in no  acute distress; alert,appropriate and cooperative throughout examination Lungs:  Normal respiratory effort, chest expands symmetrically. Lungs are clear to auscultation, no crackles or wheezes. Heart:  Normal rate and regular rhythm. S1 and S2 normal without gallop, murmur, click, rub or other extra sounds. Abdomen:  Bowel sounds positive,abdomen soft and non-tender without masses, organomegaly or hernias noted. Neurologic:  gait normal, sensation normal bilaterally, full ROM in back and legs   Impression & Recommendations:  Problem # 1:  BACK PAIN, CHRONIC (ICD-724.5) Assessment Deteriorated slightly worse.  did not give a NSAID at this time bc of her HTN.  re-referral to PT for help with back pain.  explained that PT would be a lot of work for her but best way to have long term effect. Her updated medication list for this problem includes:    Aspirin 81 Mg Tbec (Aspirin) .Marland Kitchen... 1 by mouth daily  Orders: Physical Therapy Referral (PT) [facility]- Est Level  3 (64403)  Problem # 2:  HYPERTENSION, BENIGN (ICD-401.1) Assessment: Unchanged reviewed meds, no problems/changes at this time. Her updated medication list for this problem includes:    Carvedilol 25 Mg Tabs (Carvedilol) ..... One by mouth two times a day    Lisinopril 20 Mg Tabs (Lisinopril) .Marland Kitchen... Take one daily  Complete Medication List: 1)  Aspirin 81 Mg Tbec (Aspirin) .Marland KitchenMarland KitchenMarland Kitchen  1 by mouth daily 2)  Nitroglycerin 0.4 Mg Subl (Nitroglycerin) .... Place 1 tablet under tongue as directed 3)  Patanol 0.1 % Soln (Olopatadine hcl) .Marland Kitchen.. 1 drop each eye two times a day as needed for allergy symptoms - disp 1 bottle 4)  Carvedilol 25 Mg Tabs (Carvedilol) .... One by mouth two times a day 5)  Triamcinolone Acetonide 0.1 % Crea (Triamcinolone acetonide) .... Use sparingly over affected areas twice daily 6)  Celexa 20 Mg Tabs (Citalopram hydrobromide) .... Take one daily for depression 7)  Aciphex 20 Mg Tbec (Rabeprazole sodium) .... Take one  daily 8)  Lisinopril 20 Mg Tabs (Lisinopril) .... Take one daily  Patient Instructions: 1)  It was nice to see you today 2)  Your blood pressure was 138/87.  Keep taking your medicines. 3)  For your back and leg pain, try taking tylenol 1000mg  (two 500mg  tabs) three times per day.  We will also refer you to physical therapy.   Prevention & Chronic Care Immunizations   Influenza vaccine: Fluvax MCR  (05/27/2009)    Tetanus booster: 02/27/2002: Done.   Tetanus booster due: 02/28/2012    Pneumococcal vaccine: Not documented  Colorectal Screening   Hemoccult: Done.  (01/27/2006)   Hemoccult due: Not Indicated    Colonoscopy: Done.  (12/28/2004)   Colonoscopy due: 12/29/2014  Other Screening   Pap smear: Done.  (04/29/2000)   Pap smear due: Not Indicated    Mammogram: ASSESSMENT: Negative - BI-RADS 1^MM DIGITAL SCREENING  (01/20/2009)   Mammogram due: 01/20/2010    Smoking status: never  (09/15/2009)  Hypertension   Last Blood Pressure: 138 / 87  (09/15/2009)   Serum creatinine: 0.79  (09/02/2009)   Serum potassium 4.0  (09/02/2009)    Hypertension flowsheet reviewed?: Yes   Progress toward BP goal: At goal  Lipids   Total Cholesterol: 143  (02/15/2009)   LDL: 77  (02/15/2009)   LDL Direct: Not documented   HDL: 50  (02/15/2009)   Triglycerides: 81  (02/15/2009)  Self-Management Support :   Personal Goals (by the next clinic visit) :      Personal blood pressure goal: 130/80  (04/07/2009)   Patient will work on the following items until the next clinic visit to reach self-care goals:     Medications and monitoring: take my medicines every day, check my blood pressure  (04/07/2009)    Hypertension self-management support: Written self-care plan  (09/15/2009)   Hypertension self-care plan printed.

## 2010-08-31 NOTE — Assessment & Plan Note (Signed)
Summary: DISCUSS BP PER DR SAUNDERS/RH   Vital Signs:  Patient profile:   55 year old female Weight:      203 pounds Temp:     98.2 degrees F oral Pulse rate:   83 / minute Pulse rhythm:   regular BP sitting:   137 / 83  (left arm) Cuff size:   large  Vitals Entered By: Loralee Pacas CMA (July 17, 2010 8:41 AM) CC:  bp, insomnia, back pain   Primary Care Provider:  Ellery Plunk MD  CC:   bp, insomnia, and back pain.  History of Present Illness: BP-walks 10 min daily.  taking meds as perscribed.  does not take BP at home.    insomnia/depression- stopped her celexa b/c she had no effect on mood but had chest pain with it (a common side effect complaint for her.   Now would like help with sleep.  She reports that mood is stable and can get out and be social when she wants.  Watches TV late at night, drinks soda.  lower back pain- at least one year of symptoms, not getting worse, has been to PT and not doing stretches. no incontinence, pain does not radiate.    Current Medications (verified): 1)  Aspirin 81 Mg Tbec (Aspirin) .Marland Kitchen.. 1 By Mouth Daily 2)  Nitroglycerin 0.4 Mg Subl (Nitroglycerin) .... Place 1 Tablet Under Tongue As Directed 3)  Patanol 0.1 % Soln (Olopatadine Hcl) .Marland Kitchen.. 1 Drop Each Eye Two Times A Day As Needed For Allergy Symptoms - Disp 1 Bottle 4)  Carvedilol 25 Mg Tabs (Carvedilol) .... One By Mouth Two Times A Day 5)  Triamcinolone Acetonide 0.1 % Crea (Triamcinolone Acetonide) .... Use Sparingly Over Affected Areas Twice Daily 6)  Aciphex 20 Mg Tbec (Rabeprazole Sodium) .... Take One Daily 7)  Lisinopril 20 Mg Tabs (Lisinopril) .... Take One Daily 8)  Trazodone Hcl 50 Mg Tabs (Trazodone Hcl) .... Take One By Mouth At Bedtime As Needed Insomnia  Allergies (verified): 1)  Codeine Phosphate (Codeine Phosphate)  Review of Systems  The patient denies anorexia, fever, weight loss, syncope, dyspnea on exertion, and prolonged cough.    Physical Exam  General:   NAD.  vs reviewed Lungs:  Normal respiratory effort, chest expands symmetrically. Lungs are clear to auscultation, no crackles or wheezes. Heart:  Normal rate and regular rhythm. S1 and S2 normal without gallop, murmur, click, rub or other extra sounds. Abdomen:  Bowel sounds positive,abdomen soft and non-tender without masses, organomegaly or hernias noted. Psych:  Oriented X3, memory intact for recent and remote, normally interactive, good eye contact, not anxious appearing, and not depressed appearing.     Impression & Recommendations:  Problem # 1:  BACK PAIN, LUMBAR, CHRONIC (ICD-724.2) Assessment Unchanged rec that she increase walking, do PT stretches, tylenol for pain.   Her updated medication list for this problem includes:    Aspirin 81 Mg Tbec (Aspirin) .Marland Kitchen... 1 by mouth daily  Orders: [facility]- Est  Level 4 (60454)  Problem # 2:  HYPERTENSION, BENIGN (ICD-401.1) Assessment: Unchanged will not change regimen today as at goal.  asked her to check Bps at home RTC 3 months. Her updated medication list for this problem includes:    Carvedilol 25 Mg Tabs (Carvedilol) ..... One by mouth two times a day    Lisinopril 20 Mg Tabs (Lisinopril) .Marland Kitchen... Take one daily  Orders: [facility]- Est  Level 4 (09811)  Problem # 3:  DYSTHYMIC DISORDER (ICD-300.4) Assessment: Unchanged deleted celexa,  pt did not want to try lower dose.  trazadone and some sleep hygeine recs given.  could retry SSRi if symptoms worsen. Orders: [facility]- Est  Level 4 (16109)  Complete Medication List: 1)  Aspirin 81 Mg Tbec (Aspirin) .Marland Kitchen.. 1 by mouth daily 2)  Nitroglycerin 0.4 Mg Subl (Nitroglycerin) .... Place 1 tablet under tongue as directed 3)  Patanol 0.1 % Soln (Olopatadine hcl) .Marland Kitchen.. 1 drop each eye two times a day as needed for allergy symptoms - disp 1 bottle 4)  Carvedilol 25 Mg Tabs (Carvedilol) .... One by mouth two times a day 5)  Triamcinolone Acetonide 0.1 % Crea (Triamcinolone acetonide) .... Use sparingly over  affected areas twice daily 6)  Aciphex 20 Mg Tbec (Rabeprazole sodium) .... Take one daily 7)  Lisinopril 20 Mg Tabs (Lisinopril) .... Take one daily 8)  Trazodone Hcl 50 Mg Tabs (Trazodone hcl) .... Take one by mouth at bedtime as needed insomnia  Patient Instructions: 1)  For your blood pressure: stay on your same medications 2)  take your blood pressure at least once a week and write them down.  bring them to your next visit. 3)  For your depression/sleep: try the trazadone at night.  Sleep is usually helped by a good routine at night.  no TV in bed, try to turn off overhead lights an hour before bed and avoid caffiene. 4)  For your back: exercise daily, increase your walking to 30 min per day, and do the stretches that they gave you at PT. Prescriptions: TRAZODONE HCL 50 MG TABS (TRAZODONE HCL) take one by mouth at bedtime as needed insomnia  #30 x 6   Entered and Authorized by:   Ellery Plunk MD   Signed by:   Ellery Plunk MD on 07/17/2010   Method used:   Electronically to        [facility] [address] [id number]* (retail)       [address]       [place], [place]  [address]       Ph: [phone] or [phone]       Fax: [phone]   RxID:   [id number]    Orders Added: 1)  [facility]- Est  Level 4 [03474]     Prevention & Chronic Care Immunizations   Influenza vaccine: Fluvax MCR  (05/27/2009)    Tetanus booster: 02/27/2002: Done.   Tetanus booster due: 02/28/2012    Pneumococcal vaccine: Not documented  Colorectal Screening   Hemoccult: Done.  (01/27/2006)   Hemoccult due: Not Indicated    Colonoscopy: Done.  (12/28/2004)   Colonoscopy due: 12/29/2014  Other Screening   Pap smear: Done.  (04/29/2000)   Pap smear due: Not Indicated    Mammogram: ASSESSMENT: Negative - BI-RADS 1^MM DIGITAL SCREENING  (01/26/2010)   Mammogram due: 01/20/2010    Smoking status: never  (03/21/2010)  Hypertension   Last Blood Pressure: 137 / 83   (07/17/2010)   Serum creatinine: 0.79  (09/02/2009)   Serum potassium 4.0  (09/02/2009)   Basic metabolic panel due: 10/16/2010    Hypertension flowsheet reviewed?: Yes   Progress toward BP goal: At goal  Lipids   Total Cholesterol: 143  (02/15/2009)   LDL: 77  (02/15/2009)   LDL Direct: Not documented   HDL: 50  (02/15/2009)   Triglycerides: 81  (02/15/2009)   Lipid panel due: 10/16/2010  Self-Management Support :   Personal Goals (by the next clinic visit) :  Personal blood pressure goal: 140/90  (03/21/2010)   Patient will work on the following items until the next clinic visit to reach self-care goals:     Medications and monitoring: take my medicines every day, bring all of my medications to every visit  (03/21/2010)     Eating: drink diet soda or water instead of juice or soda, eat more vegetables  (03/21/2010)     Activity: take a 30 minute walk every day  (03/21/2010)    Hypertension self-management support: Written self-care plan  (03/21/2010)

## 2010-08-31 NOTE — Assessment & Plan Note (Signed)
Summary: wants derm referral,df  declined video, ok'd seeing Katie for research.Golden Circle RN  August 23, 2010 9:26 AM  Vital Signs:  Patient profile:   55 year old female Height:      61.5 inches Weight:      204.2 pounds BMI:     38.10 Temp:     97.8 degrees F Pulse rate:   74 / minute BP sitting:   171 / 90  Vitals Entered By: Golden Circle RN (August 23, 2010 9:20 AM) CC: itchy face x 1 month Is Patient Diabetic? No Pain Assessment Patient in pain? no       Does patient need assistance? Functional Status Self care Ambulation Normal   Primary Care Provider:  Ellery Plunk MD  CC:  itchy face x 1 month.  History of Present Illness: dry itchy skin on face- x 2 weeks.  last year also had this, saw a dermatologist, but didnt think it was that helpful.  is not putting anything on face, no visible rash, hjas not tried anything for this  HTN- eating fried salty foods.  reports taking all of her meds as perscribed.  does not check BP at home.    Habits & Providers  Alcohol-Tobacco-Diet     Alcohol drinks/day: 0     Tobacco Status: never  Exercise-Depression-Behavior     Does Patient Exercise: yes     Times/week: <3     Drug Use: never     Seat Belt Use: always  Current Medications (verified): 1)  Aspirin 81 Mg Tbec (Aspirin) .Marland Kitchen.. 1 By Mouth Daily 2)  Nitroglycerin 0.4 Mg Subl (Nitroglycerin) .... Place 1 Tablet Under Tongue As Directed 3)  Patanol 0.1 % Soln (Olopatadine Hcl) .Marland Kitchen.. 1 Drop Each Eye Two Times A Day As Needed For Allergy Symptoms - Disp 1 Bottle 4)  Carvedilol 25 Mg Tabs (Carvedilol) .... One By Mouth Two Times A Day 5)  Triamcinolone Acetonide 0.1 % Crea (Triamcinolone Acetonide) .... Use Sparingly Over Affected Areas Twice Daily 6)  Aciphex 20 Mg Tbec (Rabeprazole Sodium) .... Take One Daily 7)  Lisinopril 20 Mg Tabs (Lisinopril) .... Take One Daily 8)  Trazodone Hcl 50 Mg Tabs (Trazodone Hcl) .... Take One By Mouth At Bedtime As Needed  Insomnia  Allergies (verified): 1)  Codeine Phosphate (Codeine Phosphate)  Social History: Does Patient Exercise:  yes Drug Use:  never Seat Belt Use:  always  Review of Systems  The patient denies anorexia, fever, weight loss, and syncope.    Physical Exam  General:  Well-developed,well-nourished,in no acute distress; alert,appropriate and cooperative throughout examination Lungs:  Normal respiratory effort, chest expands symmetrically. Lungs are clear to auscultation, no crackles or wheezes. Heart:  Normal rate and regular rhythm. S1 and S2 normal without gallop, murmur, click, rub or other extra sounds. Skin:  some dryness aparent in facial skin.  no raised lesions or redness   Impression & Recommendations:  Problem # 1:  HYPERTENSION, BENIGN (ICD-401.1) Assessment Deteriorated today is very elevated.  last visit was at goal.  does not check at home.  will have pt check daily for 2 weeks and RTC.  if elevated then will increase medication.  will assess for sleep apnea then.  also encouraged her to increase walking.  reviewed cards eval which thought her chest pain symptoms were due to exercise intolerance Her updated medication list for this problem includes:    Carvedilol 25 Mg Tabs (Carvedilol) ..... One by mouth two times a day  Lisinopril 20 Mg Tabs (Lisinopril) .Marland Kitchen... Take one daily  Orders: [facility]- Est Level  3 (16109)  Problem # 2:  DRY SKIN (ICD-701.1) Assessment: New pt has not tried anything.  did not like her dermatologist from last year.  we will start with moisturizers since it is winter and dry skin is common.  will refer to derm or try mild steroid if just moisturizer ineffective. Orders: [facility]- Est Level  3 (60454)  Complete Medication List: 1)  Aspirin 81 Mg Tbec (Aspirin) .Marland Kitchen.. 1 by mouth daily 2)  Nitroglycerin 0.4 Mg Subl (Nitroglycerin) .... Place 1 tablet under tongue as directed 3)  Patanol 0.1 % Soln (Olopatadine hcl) .Marland Kitchen.. 1 drop each eye two times a  day as needed for allergy symptoms - disp 1 bottle 4)  Carvedilol 25 Mg Tabs (Carvedilol) .... One by mouth two times a day 5)  Triamcinolone Acetonide 0.1 % Crea (Triamcinolone acetonide) .... Use sparingly over affected areas twice daily 6)  Aciphex 20 Mg Tbec (Rabeprazole sodium) .... Take one daily 7)  Lisinopril 20 Mg Tabs (Lisinopril) .... Take one daily 8)  Trazodone Hcl 50 Mg Tabs (Trazodone hcl) .... Take one by mouth at bedtime as needed insomnia  Patient Instructions: 1)  please come back in 2 weeks to discuss blood pressure 2)  write down your bloood pressures every day 3)  for your face- try a non-scented moisturizer two times a day morning a night for 2 weeks.  you can look around and ask at the drug store for a good product   Orders Added: 1)  [facility]- Est Level  3 [09811]

## 2010-09-19 ENCOUNTER — Ambulatory Visit (INDEPENDENT_AMBULATORY_CARE_PROVIDER_SITE_OTHER): Payer: Medicaid Other | Admitting: Family Medicine

## 2010-09-19 ENCOUNTER — Encounter: Payer: Self-pay | Admitting: Family Medicine

## 2010-09-19 DIAGNOSIS — L851 Acquired keratosis [keratoderma] palmaris et plantaris: Secondary | ICD-10-CM

## 2010-09-19 DIAGNOSIS — I10 Essential (primary) hypertension: Secondary | ICD-10-CM

## 2010-09-19 NOTE — Assessment & Plan Note (Signed)
Still with itchy skin. Has been tried on triamcinolone in past.  Pt wants to see derm for this.  Pt has been using facial lotion daily with no help

## 2010-09-19 NOTE — Assessment & Plan Note (Signed)
Improved.  Pt with most readings at goal.  Continue on same regimen.  Recheck in 3-6 months

## 2010-09-19 NOTE — Progress Notes (Signed)
  Subjective:    Patient ID: Megan Miller, female    DOB: 11-23-1955, 55 y.o.   MRN: [id number]  HPI HTN- pt taking meds as perscribed.  Came in with home BPs.  SBP between 118-150, with most readings between 125-135.  No HA, vision change, CP, or palpitations   Review of Systems     Objective:   Physical Exam  Constitutional: She appears well-developed and well-nourished.  Cardiovascular: Normal rate, regular rhythm and normal heart sounds.   Pulmonary/Chest: Effort normal and breath sounds normal.  Skin: Skin is warm and dry.       Some dry, flakey skin on face.  No erythema.           Assessment & Plan:

## 2010-09-19 NOTE — Patient Instructions (Signed)
I have put in a derm referral for you today Please keep taking your blood pressure medications Come back to see me in 3-6 months, or earlier as you need to

## 2010-10-01 ENCOUNTER — Other Ambulatory Visit: Payer: Self-pay | Admitting: Family Medicine

## 2010-10-02 NOTE — Telephone Encounter (Signed)
Refill request

## 2010-10-03 ENCOUNTER — Telehealth: Payer: Self-pay | Admitting: *Deleted

## 2010-10-03 NOTE — Telephone Encounter (Signed)
PA required for Aciphex. PA form placed in MD box for completion.

## 2010-10-13 NOTE — Telephone Encounter (Signed)
Pa completed.  To Megan Miller

## 2010-10-17 NOTE — Telephone Encounter (Addendum)
PA approved for Aciphex from  10/03/2010 thru 10/03/2010. Pharmacy notified.

## 2010-11-06 LAB — COMPREHENSIVE METABOLIC PANEL
ALT: 17 U/L (ref 0–35)
Alkaline Phosphatase: 86 U/L (ref 39–117)
GFR calc Af Amer: >60 mL/min (ref >60)
Potassium: 3.6 mEq/L (ref 3.5–5.1)
Sodium: 138 mEq/L (ref 135–145)
Total Protein: 7.7 g/dL (ref 6.0–8.3)

## 2010-11-06 LAB — CBC
HCT: 37.9 % (ref 36.0–46.0)
Hemoglobin: 12.6 g/dL (ref 12.0–15.0)
MCHC: 33.3 g/dL (ref 30.0–36.0)
MCV: 86.9 fL (ref 78.0–100.0)
Platelets: 264 10*3/uL (ref 150–400)
RBC: 4.36 MIL/uL (ref 3.87–5.11)
RDW: 13.4 % (ref 11.5–15.5)
WBC: 6.7 10*3/uL (ref 4.0–10.5)

## 2010-11-06 LAB — POCT CARDIAC MARKERS: Myoglobin, poc: 72.5 ng/mL (ref 12–200)

## 2010-11-06 LAB — DIFFERENTIAL
Eosinophils Absolute: 0 10*3/uL (ref 0.0–0.7)
Lymphocytes Relative: 36 % (ref 12–46)
Monocytes Absolute: 0.3 10*3/uL (ref 0.1–1.0)
Neutrophils Relative %: 58 % (ref 43–77)

## 2010-11-06 LAB — URINALYSIS, ROUTINE W REFLEX MICROSCOPIC
Bilirubin Urine: NEGATIVE
Glucose, UA: NEGATIVE mg/dL
Hgb urine dipstick: NEGATIVE
Ketones, ur: NEGATIVE mg/dL
Nitrite: NEGATIVE
Protein, ur: NEGATIVE mg/dL
Specific Gravity, Urine: 1.016 (ref 1.005–1.030)
Urobilinogen, UA: 2 mg/dL — ABNORMAL HIGH (ref 0.0–1.0)
pH: 7 (ref 5.0–8.0)

## 2010-12-15 NOTE — Consult Note (Signed)
[facility]  Patient:    Megan Miller, Megan Miller                   MRN: [id number] Proc. Date: 07/31/00 Adm. Date:  [id number] Disc. Date: [id number] Attending:  Armanda Heritage CC:         Deniece Ree, M.D.  Raynelle Jan, M.D.  Telford Nab, R.N.   Consultation Report  HISTORY OF PRESENT ILLNESS:  [age]-year-old white female referred by Dr. Raynelle Jan of the [facility] for evaluation of bilateral cystic ovaries and a slightly elevated CA 125.  The patients history dates back to March 2001, when she underwent an abdominal hysterectomy for treatment of severe dysmenorrhea, dyspareunia, chronic uterine pain, and leiomyomata uteri.  Her primary gynecologist is Dr. Deniece Ree.  She presented to Dr. Carolyne Fiscal in December with some vaginal bleeding, lower quadrant pain, and dysuria and on evaluation was found to have had a CT scan of the abdomen and a vaginal ultrasound in August 2001, which showed bilateral small cystic adnexal masses with some mural nodules.  A CT exam was obtained which confirmed the masses and showed no other evidence of abnormality.  The patient has been followed up and had a CT scan repeated on July 25, 2000.  When compared with the scan of March 28, 2000, the CT scan shows significant decrease in size of left-sided mass which now measures 4.7 x 3.7 cm.  The right ovarian cystic lesion measures 2.9 x 2.6 cm.  The left side has decreased considerably from prior study.  The patient has also had colonoscopy which shows relatively smooth narrowing.  It is not completely fixed in the mid sigmoid colon.  The patient herself feels well.  She denies any pelvic pain, pressure, any GI or GU symptoms.  PAST MEDICAL HISTORY:  The patient is undergoing a workup for possible coronary artery disease.  She does have what sounds like angina.  PAST SURGICAL HISTORY:  Abdominal hysterectomy.  DRUG ALLERGIES:   None.  CURRENT MEDICATIONS:  Nitroglycerin sublingually p.r.n.  SOCIAL HISTORY:  The patient does not smoke or drink.  FAMILY HISTORY:  Negative for gynecologic breast or colon cancers.  PHYSICAL EXAMINATION:  VITAL SIGNS:  Height 5 feet 2 inches, weight 180 pounds.  Blood pressure 150/82, pulse 90, respiratory rate 20.  GENERAL:  Healthy African-American female in no acute distress.  HEENT:  Negative.  NECK:  Supple.  Without thyromegaly.  There is no supraclavicular or inguinal adenopathy.  ABDOMEN:  Soft and nontender.  No masses, organomegaly, ascites, or hernias are noted.  She does have a well-healed Pfannenstiel incision.  PELVIC:  EGBUS normal.  Vagina is clean.  No lesions noted.  On bimanual exam the ovaries seem to be adherent to the vaginal cuff.  They are slightly tender and minimally enlarged.  RECTOVAGINAL:  Confirms.  IMPRESSION:  Bilateral cystic masses of the ovaries.  It is noted on Dr. Sherilyn Dacosta operative note at the time of hysterectomy the patient had hydrosalpinges.  I believe these are benign ovarian cysts and/or hydrosalpinx and/or loculated fluid collections in and around adhesions around the adnexa secondary to prior infection and surgery.  I seriously doubt that this represents a malignancy, although it should be followed carefully.  Given the fact that the patient is asymptomatic, I feel it reasonable to have a repeat ultrasound in approximately three to four months and, at the same time, have a repeat CA 125.  We  will refer the patient back to her primary gynecologist, Dr. Deniece Ree, to follow up with arranging for these studies. DD:  07/31/00 TD:  07/31/00 Job: [id number]

## 2010-12-15 NOTE — Cardiovascular Report (Signed)
[place]  Patient:    Megan Miller, Megan Miller                   MRN: [id number] Proc. Date: 11/08/00 Adm. Date:  [id number] Attending:  Berry, Jonathan [place] CC:         Cardiac Catheterization Laboratory  Raynelle Jan, M.D.  [facility], [address] [place], [place] [address]   Cardiac Catheterization  PROCEDURES PERFORMED:  Right and left heart catheterization.  INDICATIONS:  Megan Miller is a 55 year old, black female, referred by Dr. Carolyne Fiscal for evaluation of chest pain.  She had previously been seen by Dr. Algie Coffer.  She had a adenosine stress test at [facility], April 18, 1999, which was apparently negative.  She had a treadmill performed by Dr. Tawanna Cooler McDiarmid, which is described as positive.  She describes a sharp radiating pain to her upper chest with diaphoresis and shortness of breath. It occurs with exertion and is becoming more frequent.  The patient presents now for diagnostic coronary arteriography.  DESCRIPTION OF PROCEDURE:  The patient was brought to the second floor cardiac catheterization lab in the postabsorptive state.  She was premedicated with p.o. Valium and IV Versed.  Her right groin was prepped and shaved in the usual sterile fashion.  Xylocaine 1% was used for local anesthesia.  A 6 French sheath was inserted into the right femoral artery using standard Seldinger technique.  A 6 French right and left Judkins catheter, along with a AR-1 Noto catheters were used for selective left coronary, subselective right coronary angiography, left ventriculography, supravalvular aortography and distal abdominal aortography.  Omnipaque dye was used for the entirety of the case.  Retrograde, aortic, left ventricular, and pullback pressures were recorded.  A 7 French balloon-tipped thermodilution, Swan-Ganz catheter was used to obtain sequential right heart pressures, along with RA, RV, PA, and FA oxygen  saturations.  HEMODYNAMIC RESULTS: 1. RA pressure, A wave of 21, V wave 20, mean 17. 2. Right ventricular pressure, systolic 51, diastolic 20. 3. PA pressure, systolic 46, diastolic 29, mean of 37. 4. Pulmonary capillary wedge pressure, A wave of 29, V wave 34,    mean 27. 5. AO pressure 189/102, LV pressure 197/30.  There is no significant    pullback noted.  OXYGEN SATURATIONS:  RA 71%, RV 75%, PA 73%, FA 96%.  SELECTIVE CORONARY ANGIOGRAPHY: 1. Left main:  Normal. 2. Left anterior descending:  The LAD normal. 3. Left circumflex:  Normal. 4. Right coronary artery:  Subselectively visualized using a hand injection    and appeared to be normal.  SUPRAVALVULAR AORTOGRAPHY:  Supravalvular aortogram was performed in the LAO view revealing ruptured sinus and Valsalva aneurysm with moderate AI.  LEFT VENTRICULOGRAPHY:  RAO left ventriculogram was performed using 20 cc of Omnipaque dye at 10 cc/sec.  The overall LVEF was estimated at greater than 60% without focal wall motion abnormalities.  DISTAL ABDOMINAL AORTOGRAPHY:  Distal abdominal aortogram was performed using 20 cc of Omnipaque dye at 20 cc/sec.  The renal arteries were widely patent. Infrarenal abdominal aorta and iliac bifurcation appear free of significant atherosclerotic changes.  IMPRESSION:  Megan Miller has what appears to be normal coronary arteries and normal left ventricular function.  She has incidentally noted ruptured sinus of Valsalva aneurysm with at least moderate aortic insufficiency.  There is no left to right shunting noted with saturation running through the right heart, though her right heart pressures are moderately elevated.  I suspect her symptoms are related to her ruptured aneurysm.  I recommend that she undergo a transesophageal echocardiography to more closely look at her anatomy, as well as left ventricular size and degree of aortic insufficiency.  Surgical correction may be recommended.  The  sheaths were removed and pressure was held on the groin to achieve hemostasis.  The patient left the lab in stable condition. DD:  11/08/00 TD:  11/08/00 Job: [id number]

## 2010-12-15 NOTE — Consult Note (Signed)
[facility]  Patient:    Megan Miller, Megan Miller Visit Number: [id number] MRN: [id number]          Service Type: GYN Location: 4W 0463 01 Attending Physician:  Jeannette Corpus Dictated by:   Rande Brunt. Clarke-Pearson, M.D. Proc. Date: 04/29/01 Admit Date:  04/08/2001 Discharge Date: 04/16/2001   CC:         Telford Nab, R.N.   Consultation Report  A 55 year old returns today for a followup having undergone a bilateral salpingo-oophorectomy and rectosigmoid resection with a J pouch, reanastomosis on September 10.  She had a relatively uncomplicated postoperative course. Final pathology showed she had extensive endometriosis.  The patient presents today because of some drainage from her wound which has been going on for approximately a week.  PHYSICAL EXAMINATION  ABDOMEN:  Soft and nontender.  Midline incision is healing well, although there is some granulation tissue along approximately half the length of the wound.  This is probed with Q-tip and there are no areas of purulence or deeper defects.  The patients Steri-Strips are removed.  The wound is cleaned and redressed with a loose gauze.  She is instructed to shower over this once or twice a day, keep it clean and dry.  I am certain it will heal just fine.  She will return for her routine six week postoperative checkup.  She will also continue using Metamucil and laxative as needed. Dictated by:   Rande Brunt. Clarke-Pearson, M.D. Attending Physician:  Jeannette Corpus DD:  04/29/01 TD:  04/29/01 Job: [id number]

## 2010-12-15 NOTE — Discharge Summary (Signed)
[facility]  Patient:    Megan Miller, Megan Miller Visit Number: [id number] MRN: [id number]          Service Type: GYN Location: 4W 0463 01 Attending Physician:  Jeannette Corpus Dictated by:   Roseanna Rainbow, M.D. Admit Date:  04/08/2001 Discharge Date: 04/16/2001                             Discharge Summary  HISTORY OF PRESENT ILLNESS:  The patient is a 55 year old with bilateral ovarian cysts who presents for exploratory laparotomy with bilateral salpingo-oophorectomy.  Please see the dictated History and Physical as per Dr. De Blanch for further details.  HOSPITAL COURSE:  The patient was admitted and underwent a bilateral salpingo-oophorectomy with rectal resection, low rectal anastomosis with the J-pouch and sigmoidoscopy.  Please see the dictated operative summary for further details.  Her postoperative course was remarkable for T-max of 101 in the first 24 hours that resolved.  She also developed mild ileus that responded to conservative management.  She was discharged to home on postop day #8 tolerating a regular diet.  DISCHARGE DIAGNOSIS:  Endometriosis.  PROCEDURE:  Exploratory laparotomy with bilateral salpingo-oophorectomy, rectal resection, low rectal anastomosis with J-pouch and sigmoidoscopy.  CONDITION ON DISCHARGE:  Good.  DIET:  Regular.  ACTIVITY:  As per the discharge instruction booklet.  DISCHARGE MEDICATIONS: 1. Metamucil. 2. Milk of Magnesia. 3. Vicodin p.r.n.  FOLLOWUP:  The patient was to follow up on October 29, at 3 p.m. with Dr. Stanford Breed. Dictated by:   Roseanna Rainbow, M.D. Attending Physician:  Jeannette Corpus DD:  05/15/01 TD:  05/16/01 Job: [id number]

## 2010-12-15 NOTE — Op Note (Signed)
[facility]  Patient:    Megan Miller, Megan Miller Visit Number: [id number] MRN: [id number]          Service Type: NES Location: NESC Attending Physician:  Londell Moh Dictated by:   Jamison Neighbor, M.D. Proc. Date: 06/03/01 Admit Date:  06/03/2001   CC:         Raynelle Jan, M.D.   Operative Report  PREOPERATIVE DIAGNOSIS:   Interstitial cystitis.  POSTOPERATIVE DIAGNOSIS:  Interstitial cystitis.  OPERATION:  Cystoscopy, urethral calibration, hydrodistention of the bladder, Marcaine and Pyridium solution, Marcaine and Kenalog injection.  Bladder biopsy with cauterization.  SURGEON:  Jamison Neighbor, M.D.  ANESTHESIA:  General.  COMPLICATIONS:  None.  DRAINS:  None.  INDICATIONS: This 55 year old female has had problems with severe urinary urgency, frequency, and bladder pain.  The patient has not responded to anticholinergic therapy and her evaluation does appear to be consistent with possible interstitial cystitis.  The patient is to undergo cystoscopy under anesthesia to determine if she has IC.  She understands the risks and benefits of the procedure and gave full and informed consent.  DESCRIPTION OF PROCEDURE:  After successful induction of general anesthesia, the patient was placed in the dorsal lithotomy position, prepped with Betadine and draped in the usual sterile fashion.  Careful bimanual examination revealed no abnormality of the urethra. Specifically, there was no evidence of a urethral diverticulum or any urethral discharge.  There was no evidence of a cystocele, rectocele, or enterocele.  There was no mass on bimanual exam.  The urethra was calibrated to 93 [place] with female urethral sounds.  The cystoscope was inserted, and the bladder was carefully inspected.  It was free of any tumor or stones.  Both ureteral orifices were normal in configuration and location.  The bladder was distended to a pressure of 100 cm  of water for five minutes.  When the bladder was drained, glomerulations were seen throughout the bladder consistent with interstitial cystitis.  The bladder capacity was 550 cc which is almost exactly the average for an IC patient. There were no Hunners ulcers.  A biopsy was taken and the biopsy site was cauterized.  A mixture of Marcaine and Pyridium was left in the bladder. Marcaine and Kenalog were injected periurethrally.  The patient was given intraoperative Toradol and Zofran as well as a postoperatively B&O suppository.  In the recovery room, the patient will have Zofran and morphine available.  The patient tolerated the procedure well and was taken to the recovery room in good condition.  She will be sent home with Lorcet Plus, Pyridium Plus and Levaquin and will return to the office in one to two weeks time to make further plans for therapy. Dictated by:   Jamison Neighbor, M.D. Attending Physician:  Londell Moh DD:  06/03/01 TD:  06/04/01 Job: [id number]

## 2010-12-15 NOTE — Discharge Summary (Signed)
[facility]  Patient:    Megan Miller, Megan Miller                   MRN: [id number] Adm. Date:  [id number] Disc. Date: [id number] Attending:  Deniece Ree                           Discharge Summary  HISTORY OF PRESENT ILLNESS:   The patient is a 55 year old female who was admitted for a total abdominal hysterectomy.  The patient had been referred to me and on  evaluation, it was noted that she had a 12 to 14-week size uterus with numerous  myomas present.  HOSPITAL COURSE:              The patient was admitted on March 27, at which time, she underwent a total abdominal hysterectomy with lysis of adhesions. Postoperatively this patient did very well without any problems and was discharged on the second postoperative day.  The patient was explained to as to the possible complications and care following this type of surgery.  She understands and is supposed to return to my office in one month for follow-up evaluation or to call me prior to that time should any problems arise. DD:  11/08/99 TD:  11/09/99 Job: [id number]

## 2010-12-15 NOTE — Consult Note (Signed)
[facility]  Patient:    Megan Miller, Megan Miller Visit Number: [id number] MRN: [id number]          Service Type: GON Location: GYN Attending Physician:  Sabino Donovan Dictated by:   Jackquline Denmark. Kyla Balzarine, M.D. Admit Date:  06/18/2001   CC:         Raynelle Jan, M.D., [facility]  Deniece Ree, M.D.  Telford Nab, R.N.   Consultation Report  REASON FOR FOLLOWUP:  This 55 year old woman returns for first postop followup after undergoing extensive surgery for endometriosis.  Patient underwent BSO with rectosigmoid resection and reanastomosis for left endometrioma on April 09, 1999.  Her postoperative course was essentially uncomplicated and she has done relatively well since surgery, having relatively normal bowel function on Metamucil.  On May 08, 2001, she was begun on Macrobid for symptoms of urinary tract infection.  She has had intercourse without difficulty.  PHYSICAL EXAMINATION:  VITAL SIGNS:  Weight 168 pounds.  Blood pressure 120/80.  GENERAL:  Patient is alert and oriented x 3, in no acute distress.  ABDOMEN:  Soft and benign with well-healed incision without inflammation or hernia.  There is minimal tenderness.  MUSCULOSKELETAL:  There is no back or CVA tenderness or leg swelling.  PELVIC:  Examination reveals intact cuff, healing well.  Bimanual and rectovaginal examinations reveal widely patent rectosigmoid anastomosis and the expected postop induration.  ASSESSMENT:  Endometriosis, post extensive surgery.  PLAN:  Patient is essentially cleared for all activity.  She will be seen by her primary care physician and can return to see [place] at any time on a p.r.n. basis. Dictated by:   Jackquline Denmark. Kyla Balzarine, M.D. Attending Physician:  Ronita Hipps T DD:  06/18/01 TD:  06/19/01 Job: [id number]

## 2010-12-15 NOTE — Op Note (Signed)
[facility]  Patient:    Megan Miller, Megan Miller Visit Number: [id number] MRN: [id number]          Service Type: GYN Location: 4W 0463 01 Attending Physician:  Jeannette Corpus Dictated by:   Rande Brunt. Clarke-Pearson, M.D. Proc. Date: 04/08/01 Admit Date:  04/08/2001 Discharge Date: 04/16/2001   CC:         Roseanna Rainbow, M.D.  Telford Nab, R.N.   Operative Report  PREOPERATIVE DIAGNOSIS:  Bilateral ovarian cysts, which are complex, with an elevated CA125.  POSTOPERATIVE DIAGNOSES:  Extensive endometriosis, endometriomas, pelvic adhesive disease, retroperitoneal fibrosis.  PROCEDURE:  Exploratory laparotomy, bilateral salpingo-oophorectomy, rectosigmoid resection, low rectal anastomosis with creation of a rectal J-pouch, sigmoidoscopy.  SURGEON:  Daniel L. Clarke-Pearson, M.D.  ASSISTANTS: Roseanna Rainbow, M.D. Telford Nab, R.N.  ESTIMATED BLOOD LOSS:  450 cc.  ANESTHESIA:  General with orotracheal tube.  SURGICAL FINDINGS:  At exploratory laparotomy, the patient had previously had a hysterectomy.  She had bilateral ovarian cystic tumors which were densely fibrotic and adherent to the bladder, pelvic sidewall, retroperitoneum and rectosigmoid colon.  The upper abdomen was normal.  The tumors were so densely adherent to the rectosigmoid colon that it was impossible to remove them without removing a portion of the rectosigmoid colon, which was accomplished, and reanastomosis performed.  DESCRIPTION OF PROCEDURE:  The patient was brought to the operating room and after satisfactory attainment of general anesthesia, was placed in a modified lithotomy position in Kelly stirrups.  The anterior abdominal wall, perineum and vagina were prepped with Betadine, a Foley catheter was placed and the patient was draped.  The abdomen was entered through a midline incision and peritoneal washings were obtained.  The upper  abdomen was explored with the above-noted findings.  Bookwalter retractor was assembled.  Small bowel and omental adhesions to the pelvis were lysed, exposing the pelvis.  The bowel was packed out of the pelvis with the above-noted findings.  The retroperitoneal spaces on both sides of the pelvis were opened in order to identify the vessels and ureters.  The ovarian vessels were skeletonized, clamped, cut and suture-ligated and free-tied.  The ureters were further mobilized and because of retroperitoneal fibrosis and the need to protect the ureters from the central pelvic mass, ureterolysis was performed bilaterally. With the ureters retracted laterally, the peritoneal incision was carried medially.  Dense adhesions to the bladder were lysed with sharp and blunt dissection and the bladder was mobilized away from the mass.  The vagina was palpated and the vaginal apex identified; the bladder was further mobilized off the upper vagina.  In order to identify the vagina and open the rectovaginal septum, the upper vagina was opened and grasped with Kocher clamps.  The posterior vaginal wall was then dissected free from the rectum and the rectovaginal space was opened.  Further dissection along the rectosigmoid colon in attempt to remove the tumors from it was unsuccessful in that the ovarian tumors were densely adherent to the rectosigmoid colon.  It was felt therefore that rectosigmoid resection would be necessary in order to remove the tumor.  The proximal sigmoid colon was isolated and transected using a GIA stapler.  The rectosigmoid mesentery was then isolated after the presacral space was opened.  The mesentery of the sigmoid colon was then divided using an Endo GIA stapler.  With the presacral space and the pararectal spaces opened, the mass and sigmoid colon were mobilized.  The distal rectum was further mobilized away  from the vagina and isolated from pararectal fat.  Vascular pedicles  of the middle rectal artery were clamped, cut and suture-ligated.  The rectum was then divided using a roticulating TA-55 stapler.  The rectosigmoid colon and bilateral tubes and ovaries were submitted to pathology as a single specimen.  Pathology showed that this is endometriomas and extensive endometriosis and fibrosis.  The descending colon was mobilized and a J-pouch was created by using a 75-mm GIA stapler.  A pursestring suture was placed around the enterotomy and the anvil of a #31-mm circular stapling device was sutured in place using 2-0 Prolene.  The 31-mm stapling device was placed through the anus and against the rectal staple line.  The anvil was then brought down to the stapler and connected. The stapler approximated the descending and proximal and distal colon and the stapler was fired.  The stapler was removed and two donuts of proximal and distal rectum were identified that were intact.  Proctoscopy was then performed and a bubble test performed showing no evidence of an air leak. Proctoscopy also was performed, searching for any bleeding, and none was noted.  The proctoscope was removed.  The pelvis was irrigated with copious amounts of warm saline.  Hemostasis was achieved in the pelvis.  A #19 Blake drain was placed through a stab incision in the right lower quadrant and sutured to the skin using 2-0 silk.  The retractors and packs were removed and the anterior abdominal wall closed in layers, the first being a running Smead-Jones closure using #1 PDS. Subcutaneous tissue was irrigated and reapproximated with 3-0 Vicryl.  Skin was closed skin staples and a dressing was applied.  The patient was awakened from anesthesia and taken to the recovery room in satisfactory condition. Sponge, needle and instrument counts were correct x 2. Dictated by:   Rande Brunt. Clarke-Pearson, M.D. Attending Physician:  Jeannette Corpus DD:  04/22/01 TD:  04/22/01 Job:  [id number]

## 2010-12-15 NOTE — Op Note (Signed)
NAME:  Megan, Miller                      ACCOUNT NO.:  [id number]   MEDICAL RECORD NO.:  [id number]                   PATIENT TYPE:  AMB   LOCATION:  NESC                                 FACILITY:  [facility]   PHYSICIAN:  Jamison Neighbor, M.D.               DATE OF BIRTH:  1955-11-30   DATE OF PROCEDURE:  11/12/2003  DATE OF DISCHARGE:                                 OPERATIVE REPORT   PREOPERATIVE DIAGNOSIS:  Interstitial cystitis.   POSTOPERATIVE DIAGNOSIS:  Interstitial cystitis.   PROCEDURE:  1. Cystoscopy.  2. Urethral calibration.  3. Hydrodistention of the bladder.  4. Marcaine and Pyridium instillation.  5. Marcaine and Kenalog injection.   SURGEON:  Jamison Neighbor, M.D.   ANESTHESIA:  General.   COMPLICATIONS:  None.   DRAINS:  None.   BRIEF HISTORY:  This 55 year old female has undergo previous evaluation with  hydrodistention which demonstrated interstitial cystitis.  The patient has  been on clinical trials without much improvement in her symptoms.  In the  past, the patient took Elmiron and Atarax but discontinued them on her on  and never took them for long enough to have an adequate trial.  The patient  has deteriorated in her symptoms.  She has failed to respond to  anticholinergic therapy and clearly needs to be evaluated for interstitial  cystitis.  The patient realizes in the past, she has had a positive response  to hydrodistention and may get a comparable response to this distention;  however, there is no guarantee that that will happen.  Once the  hydrodistention has been completed, we will be able to give her options for  therapy including instillation therapy and Elmiron-based oral therapy, but  right now we hope to improve her situation so that the patient's urgency,  frequency, and pain will be at least temporarily decreased.  The patient  understands the risks and benefits of the procedure and in particular the  fact that there is no  guarantee she will have a positive response to the  hydrodistention.  She gave full informed consent.   DESCRIPTION OF PROCEDURE:  After successful induction of general anesthesia,  the patient was placed in the dorsal lithotomy position, prepped with  Betadine, and draped in the usual sterile fashion.  A careful bimanual  examination revealed no abnormalities of the urethra.  There was no  cystocele, rectocele, or enterocele to speak of.  There were no masses on  bimanual exam and no evidence of vaginal or urethral discharge.  The urethra  was calibrated to 61 [place] with female urethral sounds with no evidence of  stenosis or stricture.  The cystoscope was inserted; the bladder was  carefully inspected.  It was free of any tumor or stones.  Both ureteral  orifices were normal in configuration and location.  The bladder mucosa  actually had a somewhat pale appearance, consistent with scar, and moderate  trabeculation was  also noted.  The patient's previous biopsy site was  identified.  Hydrodistention was performed at a pressure of 100 cm of water  for 5 minutes.  When the bladder was drained, the patient had glomerulations  throughout the bladder.  She had a terminal tinge and a diminished bladder  capacity of just under 700 mL.  This compares with the average for  interstitial cystitis of 575 and a normal capacity of 1150 in an adult  female.  The patient's bladder was drained once again.  A mixture of  Marcaine and Pyridium was left within the bladder.  Marcaine and Kenalog  were injected periurethrally as opposed to operative block.  The patient  received intraoperative Toradol, Zofran, and a B&O suppository.  She will be  sent home with Lorcet 10, Pyridium Plus, and Levaquin for just a few days  and will return to the office in follow-up at which point, she will be  started on a combination protocol of Elmiron, Atarax, and Elavil along with  instillation therapy for  induction.                                               Jamison Neighbor, M.D.    RJE/MEDQ  D:  11/12/2003  T:  11/12/2003  Job:  [id number]

## 2010-12-15 NOTE — Consult Note (Signed)
NAME:  Megan Miller, Megan Miller                      ACCOUNT NO.:  [id number]   MEDICAL RECORD NO.:  [id number]                   PATIENT TYPE:  OUT   LOCATION:  GYN                                  FACILITY:  [facility]   PHYSICIAN:  De Blanch, M.D.         DATE OF BIRTH:  24-Oct-1955   DATE OF CONSULTATION:  11/23/2003  DATE OF DISCHARGE:                                   CONSULTATION   HISTORY OF PRESENT ILLNESS:  A 55 year old African-American female who  presents complaining of chronic constipation.   The patient notes that she has had chronic constipation for over the past  year.  She has a bowel movement approximately every 7 days, and this is only  when she uses magnesium citrate.  She claims she is using a high fiber diet,  drinks plenty of fluids, and has some physical activity.  She denies any  rectal bleeding or pain.  The caliber of the stool is reported as normal.   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  She had an uncomplicated postoperative course, and had normal  bowel movements initially.   The patient has not been seen in our clinic since November of 2002.   PAST MEDICAL HISTORY:   MEDICAL ILLNESSES:  Hypertension.   CURRENT MEDICATIONS:  1. Norvasc.  2. Baby aspirin.   DRUG ALLERGIES:  None.   SOCIAL HISTORY:  The patient is divorced.  She does not smoke.   REVIEW OF SYSTEMS:  Negative, except as noted above.   FAMILY HISTORY:  Negative for gynecologic, breast, or colon cancers.   PHYSICAL EXAMINATION:  VITAL SIGNS:  Weight 190 pounds, blood pressure  158/88.  GENERAL:  The patient is a healthy, pleasant, black female in no acute  distress.  HEENT:  Negative.  NECK:  Supple without thyromegaly.  There is no supraclavicular or inguinal  adenopathy.  ABDOMEN:   Soft and nontender.  No masses, organomegaly, ascites, or hernias  noted.  Midline incision is well healed.  PELVIC:  EGBUS.  Vagina, bladder, and urethra are normal.  Vaginal cuff is  well healed.  No lesions are noted.  Cervix and uterus are surgically  absent.  Adnexa without masses.  RECTOVAGINAL EXAM:  Confirms.  I do not feel any evidence of stenosis of her  anastomosis.  LOWER EXTREMITIES:  Without edema or varicosities.   IMPRESSION:  1. Past history of endometriosis.  2. Chronic constipation for over a year.   While the possibility of anastomosis stricture is possible, I believe the  patient has chronic constipation based on another source.  I therefore  recommend that she undergo a gastroenterology evaluation to rule out a  stricture, and to obtain additional input as to management of her  constipation.  Will arrange for her to have that consultation.  De Blanch, M.D.    DC/MEDQ  D:  11/24/2003  T:  11/24/2003  Job:  [id number]   cc:   Renae Gloss, M.D.  [facility]   Telford Nab, R.N.  [phone] [address]  [place], [place] [address]

## 2010-12-15 NOTE — H&P (Signed)
[facility] [address] [facility]  Patient:    Megan Miller, Megan Miller                   MRN: [id number] Adm. Date:  [id number] Attending:  Deniece Ree                         History and Physical  HISTORY:                      Patient is a 55 year old female who came to me because of chronic and severe pelvic pain, severe dysmenorrhea, and dyspareunia. Patient states she has also been told that she had fibroids present.  She indicates that her menses have been getting heavier each month and that she is seeking definitive treatment.  PAST MEDICAL HISTORY:         Significant in that patient has been treated for hypertension using Norvasc and has stated that she has had a complete work-up on a type of cardiac problem.  This was verified by Dr. Ricki Rodriguez which sent a  report over and felt that she was stable.  PHYSICAL EXAMINATION:  GENERAL:                      Revealed a well-developed, well-nourished female n no acute distress.  HEENT:                        Within normal limits.  NECK:                         Supple.  BREASTS:                      Without masses, tenderness, or discharge.  LUNGS:                        Clear to percussion, auscultation.  HEART:                        Normal sinus rhythm without murmur, rub, or gallop.  ABDOMEN:                      Benign.  EXTREMITIES:                  Within normal limits.  NEUROLOGIC:                   Within normal limits.  PELVIC:                       Revealed external genitalia and BUS to be within normal limits.  Vagina is clear.  Cervix is firm and nontender.  The uterus is approximately 12-14 weeks in size with numerous myomas present and moderate to severe tenderness on palpation.  There is some decreased movement of the uterus. The adnexa reveal moderate tenderness on the right with questionable small mass in the left tube or ovary.  The right appeared to be within normal  limits.  DIAGNOSIS:                    1. Leiomyomata uteri approximately 12-14 weeks.  2. Severe dysmenorrhea and dyspareunia.                               3. Chronic uterine pain, possibly adenomyosis.  PLAN:                         Total abdominal hysterectomy. DD:  10/24/99 TD:  10/24/99 Job: [id number]

## 2010-12-15 NOTE — Op Note (Signed)
[facility]  Patient:    Megan Miller, Megan Miller Visit Number: [id number] MRN: [id number]          Service Type: DSU Location: [facility] [address] Attending Physician:  Fernande Boyden Dictated by:   Gloris Manchester. Lazarus Salines, M.D. Proc. Date: 10/01/01 Admit Date:  10/01/2001 Discharge Date: 10/01/2001                             Operative Report  PREOPERATIVE DIAGNOSIS:  Hypopharyngeal foreign body.  POSTOPERATIVE DIAGNOSIS:  No foreign body identified.  OPERATION PERFORMED:  Direct laryngoscopy and biopsy, esophagoscopy.  SURGEON:  Gloris Manchester. Lazarus Salines, M.D.  ANESTHESIA:  General orotracheal.  ESTIMATED BLOOD LOSS:  Minimal.  COMPLICATIONS:  None.  OPERATIVE FINDINGS:  Essentially normal examination of the oral cavity, oropharynx, hypopharynx, larynx and cervical esophagus to both palpation in the appropriate areas and also to endoscopy.  Some very minor excoriations, one in the posterior wall of the left pyriform sinus which was biopsied. Slight oozing from several small excoriations more than might be typically expected but of no significant quantity.  DESCRIPTION OF PROCEDURE:  With the patient in a comfortable supine position, general orotracheal anesthesia was induced without difficulty.  At an appropriate level, the table was turned 90 degrees and the patient placed in a slightly reversed Trendelenburg.  A clean preparation and draping was accomplished.  A rubber tooth guard was applied.  Taking care to protect lips, teeth and endotracheal tube, the Dedo laryngoscope was introduced.  Full endoscopy of the oropharynx, hypopharynx and larynx was carefully performed with the findings as described above.  No foreign body was identified.  The laryngoscope was removed.  The entire oral cavity, nasopharynx, oropharynx, hypopharynx with special reference to the base of tongue, valleculae, tonsils, and down toward the piriformis was digitally  palpated.  No significant findings.  The cervical esophagoscope was lubricated and passed easily to its full length.  Minor oozing from small excoriations as described above but no discrete lesions identified.  No foreign body seen.  The esophagoscope was slowly removed under direct vision with no findings.  The laryngoscope was once again reintroduced and full inspection was again performed carefully with no significant findings.  A small biopsy was taken from the lesion on the posterior wall of the left piriform sinus although this looked like a bland excoriation or minor mucosal ulceration.  No other findings.  The pharynx was carefully suctioned free of small amounts of residual blood.  The laryngoscope was removed.  The tooth guard was removed. Dental status was intact.  The patient was returned to anesthesia, awakened, extubated and transferred to recovery in stable condition.  COMMENT: A 55 year old black female with probable reflux both by clinical history and on barium swallow with a history of a fish bone ingested two weeks ago and persistent symptoms despite treatment for possible mucositis and reflux was the indication for todays procedure.  Anticipate postoperative recovery with attention to pharyngeal hygiene and analgesics.  Given low anticipated risk of postanesthetic or postsurgical complications, I feel an outpatient venue is appropriate. Dictated by:   Gloris Manchester. Lazarus Salines, M.D. Attending Physician:  Fernande Boyden DD:  10/01/01 TD:  10/01/01 Job: [id number]

## 2010-12-15 NOTE — Op Note (Signed)
[facility]  Patient:    Megan Miller, Megan Miller                   MRN: [id number] Proc. Date: 10/24/99 Adm. Date:  [id number] Attending:  Deniece Ree                           Operative Report  PREOPERATIVE DIAGNOSIS:       Leiomyomata uteri; severe dysmenorrhea and dyspareunia; chronic uterine pain.  POSTOPERATIVE DIAGNOSIS:      Leiomyomata uteri, severe dysmenorrhea and dyspareunia, chronic uterine pain.  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  OPERATION:                    Total abdominal hysterectomy and lysis of adhesions.  SURGEON:                      Deniece Ree, M.D.  ASSISTANT:                    Kathreen Cosier, M.D.  ANESTHESIA:                   General anesthesia.  ESTIMATED BLOOD LOSS:         1000 cc.  DRAINS:                       Foley was left to straight drainage.  CONDITION:                    The patient tolerated the procedure well and returned to the recovery room in satisfactory condition.  DESCRIPTION OF PROCEDURE:     The patient was taken to the operating room and prepped and draped in the usual fashion for abdominal surgery.  A low Pfannenstiel incision was made.  This was carried down to the fascia at which time the fascia was entered and excised the extent of the incision.  The midline identified and the rectus muscles separated.  The abdominal peritoneum was then entered in a vertical fashion using Metzenbaum scissors.  Immediately upon entering the lower abdomen, pelvic cavity, massive pelvic adhesions were present.  A right hydrosalpinx was  present which totally distorted the pelvic view.  With slowly sharp and blunt dissections, most of the organs were replaced to their normal anatomical positions. After this was carried out, the round ligament on the right was then clamped and ligated using #1 chromic in a Heaney stitch.  This was done likewise on the opposite  side.  Skeletonization of the uterine vessels were then carried out. hey were then doubly clamped and ligated again using #1 chromic in Heaney stitches.  The right tube which was a hydrosalpinx ruptured spilling a moderate amount of brown fluid and clear fluid into the pelvis.  At this point, a supracervical incision was made to remove the uterus which was done with minimal amount of difficulty.  At this point, the cervix was then grasped, elevated with straight  Kocher clamps, and the cardinal and uterosacral ligaments were then doubly clamped and ligated using #1 chromic in a Heaney stitch.  With the use of Satinsky scissors and a long knife, the cervix was then removed from the vaginal cuff.  At this point, hemostasis was reobtained.  After this was satisfactorily done, reperitonealization was then carried out using 2-0 chromic in a running stitch.  At this point, after hemostasis was obtained, sponge, needle, and instrument counts were correct x 2.  The abdominal peritoneum was then closed using 2-0 chromic in a running stitch followed by closure of the fascia using #1 Dexon in a running stitch.  The skin was closed with skin staples.  The procedure was terminated.  The patient tolerated the procedure well and returned to the recovery room in satisfactory condition. DD:  10/24/99 TD:  10/24/99 Job: [id number]

## 2010-12-15 NOTE — Cardiovascular Report (Signed)
NAME:  Megan Miller, HAVLICEK                      ACCOUNT NO.:  [id number]   MEDICAL RECORD NO.:  [id number]                   PATIENT TYPE:  AMB   LOCATION:  ENDO                                 FACILITY:  [facility]   PHYSICIAN:  Darlin Priestly, M.D.             DATE OF BIRTH:  [birth year]   DATE OF PROCEDURE:  01/04/2004  DATE OF DISCHARGE:                              CARDIAC CATHETERIZATION   PROCEDURE PERFORMED:  Transesophageal echocardiogram.   CARDIOLOGIST:  Darlin Priestly, M.D.   COMPLICATIONS:  None.   INDICATIONS:  Ms. Loyal is a 55 year old female, patient of Dr. Nanetta Batty, with a history of chest pain, status post cardiac catheterization in  January 2004 revealing normal coronaries with normal LV function and mild  aortic regurgitation.  She is now referred for a transesophageal  echocardiogram to evaluate the ascending aorta and aortic valve to rule out  for ruptured sinus of Valsalva or Valsalva aneurysm.   DESCRIPTION OF PROCEDURE:  After getting informed written consent the  patient was brought to the endoscopy suite in the fasting state.  The  patient then underwent successful uncomplicated transesophageal  echocardiogram.   FINDINGS:  There was concentric LVH with normal LV systolic function.  Estimated EF of 60%.   The ascending aorta appeared to be normal with dimensions of 2.5-2.8 cm.  There did not appear to be evidence of aortic dissection.  There was a  structure-normal aortic valve with trivial aortic regurgitation.  There was  a structure-normal mitral valve with trivial mitral regurgitation.  There  was a tricuspid valve with trivial tricuspid mitral regurg.  Normal pulmonic  valve.  No evidence of intracardiac mass of thrombus noted.  Normal  descending thoracic aorta.  No evidence of patent foramen ovale by color  Doppler.   CONCLUSION:  No evidence of ascending aortic aneurysm or significant aortic  regurgitation.                               Darlin Priestly, M.D.    RHM/MEDQ  D:  01/04/2004  T:  01/05/2004  Job:  [id number]   cc:   Nanetta Batty, M.D.  Fax: [phone]

## 2010-12-15 NOTE — Consult Note (Signed)
[facility]  Patient:    Megan Miller, Megan Miller Visit Number: [id number] MRN: [id number]          Service Type: GON Location: GYN Attending Physician:  Jeannette Corpus Proc. Date: 02/12/01 Adm. Date:  02/12/2001 Disc. Date: 02/12/2001   CC:         Raynelle Jan, M.D.  Telford Nab, R.N.   Consultation Report  HISTORY OF PRESENT ILLNESS:  [age]-year-old white female who was initially seen in January for evaluation of bilateral cystic masses in the ovaries.  The patient had a past cardiac history and prior to considering any surgical intervention, it was felt that she needed further cardiac evaluation as well as potential for serial followup with ultrasounds and CA125 values. Please see my note of July 31, 2000.  Subsequently, the patient had a CAT scan of the pelvis on July 5th showing significant decrease in size of the dominant cyst in the right adnexal region; otherwise, the other cystic areas and slightly solid adnexal masses were unchanged and there was no evidence of adenopathy or free fluid in the pelvis.  She has a cyst on the dome of the liver.  She has undergone further evaluation of her cardiac status and it is concluded that she has noncardiac chest pain.  She was started on Prevacid. Her workup included coronary angiogram on April 12th.  It is noted that her CA125 on repeat on January 08, 2001 was 39; previously, on July 11, 2000, it was 39 u/ml.  The patient denies any pelvic pain, pressure, vaginal bleeding or discharge.  PHYSICAL EXAMINATION:  VITAL SIGNS:  Weight 181 pounds.  Blood pressure 130/84, pulse 84.  GENERAL:  The patient is a pleasant woman in no acute distress, although she does have pelvic pain.  HEENT:  Negative.  NECK:  Supple without thyromegaly.  NODES:  There is no supraclavicular or inguinal adenopathy.  ABDOMEN:  Soft and nontender.  No masses, organomegaly, ascites or herniae  are noted.  She has a well-healed Pfannenstiel incision.  PELVIC:  EGBUS normal.  Vagina is clean.  On bimanual examination, there is tenderness throughout the pelvis and a mass effect consistent with pelvic mass as previously noted on scans.  Rectovaginal exam confirms.  IMPRESSION:  Bilateral ovarian cysts which are stable to slightly improved on CAT scan although she does have significant symptomatology and a rising CA125.  PLAN:  I would recommend the patient undergo exploratory laparotomy with bilateral salpingo-oophorectomy and frozen section to determine the nature of these cysts.  They do not seem to be functional, nor do they seem to be truly resolving and the patient is symptomatic.  The risks of surgery including hemorrhage, infection, injury to adjacent viscera and thromboembolic complications are outlined to the patient.  She understands these risks.  All of her questions are answered.  She is strongly desirous of going ahead with surgery.  We will schedule surgery for the near future.  All the patients questions are answered. Attending Physician:  Garvin Fila DD:  03/18/01 TD:  03/19/01 Job: [id number]

## 2010-12-15 NOTE — Op Note (Signed)
NAMEVELMER, Megan Miller            ACCOUNT NO.:  [id number]   MEDICAL RECORD NO.:  [id number]          PATIENT TYPE:  AMB   LOCATION:  ENDO                         FACILITY:  [facility]   PHYSICIAN:  Petra Kuba, M.D.    DATE OF BIRTH:  02/02/1956   DATE OF PROCEDURE:  01/02/2005  DATE OF DISCHARGE:                                 OPERATIVE REPORT   PROCEDURE:  Colonoscopy.   INDICATIONS:  Constipation.  Consent was signed after risks, benefits,  methods, options thoroughly discussed in the office.   MEDICINES USED:  Demerol 50, Versed 5.   PROCEDURE:  Rectal inspection pertinent for external hemorrhoids, small.  Digital exam was negative. Video pediatric adjustable colonoscope was  inserted and in the proximal rectum, seemed like an anastomosis from surgery  which she had denied initially any surgeries to me in the office; did admit  to the nurses here that she had a hysterectomy, did not say anything about  rectal surgery.  Retrospectively, we looked up the path on the computer and  did see that the rectum was removed as part of endometriosis surgery based  on the pathology.  The scope was, with abdominal pressure, easily advanced  around the colon to the cecum. The prep was only fair.  Lots of washing and  suctioning was done, but there was formed stool in various parts of the  colon which could not be washed or suctioned and continued to clog the  scope.  The cecum was identified by the appendiceal orifice and ileocecal  valve.  In fact, the scope was inserted a short ways in the terminal ileum  which was normal. Photo documentation was obtained. Scope was slowly  withdrawn.  With lots of washing and suctioning the best I think  visualization was possible and no abnormalities were seen other than the  rectal anastomosis mentioned above.  It seemed to be a colon end to rectal  side anastomosis no other abnormalities were seen. Once back in the rectum  anorectal pull-through and  retroflexion confirmed some small hemorrhoids.  Scope was straightened and readvanced a short ways up the left side of the  colon.  Air was suctioned and scope removed. The patient tolerated the  procedure well. There was no obvious immediate complication.   ENDOSCOPIC DIAGNOSES:  1.  Internal-external hemorrhoids.  2.  Low anterior resection; patient did not mention in the office or to the      hospital nurse.  Based on pathology on the computer, probably done as      part of her endometriosis surgery by Dr. Stanford Breed in 2002.  3.  Otherwise within normal limits to the terminal ileum, however, with some      stool not being able to suction, lesions could have been missed.   PLAN:  Repeat screening in five years, would need a better prep at that  time.  Happy to see back p.r.n., otherwise would continue MiraLax and return  care to the family doctors for the customary healthcare maintenance.      MEM/MEDQ  D:  01/02/2005  T:  01/02/2005  Job:  [id number]   cc:   [facility] [address]

## 2010-12-22 ENCOUNTER — Telehealth: Payer: Self-pay | Admitting: Family Medicine

## 2010-12-22 NOTE — Telephone Encounter (Signed)
Megan Miller left note about wanting to change PCPs because her feet are swelling, that her Dr wants her to walk but it hurts her to walk and she is not happy with care. Called and left message for her to call and leave time and number where I can reach her

## 2010-12-26 NOTE — Telephone Encounter (Signed)
Spoke with patient. .  She feels that she has pain in her feet and they are swollen and that she is tired and has pain when she walks and that her doctor is only interested in her walking not her pain.  Last visit was in Feb 2012 for HTN.  No mention of pain or foot swelling  I related would discuss with Dr Hulen Luster and that she would contact her.

## 2011-01-04 NOTE — Telephone Encounter (Signed)
Left message for pt to call and let me know when a good time to call would be.  Told her I would be available in clinic most of tomorrow to call her back if she left a message

## 2011-01-05 ENCOUNTER — Telehealth: Payer: Self-pay | Admitting: Family Medicine

## 2011-01-05 NOTE — Telephone Encounter (Signed)
Returning call to Dr. Hulen Luster

## 2011-01-05 NOTE — Telephone Encounter (Signed)
Talked to pt.  Apologized that she has not felt listened to at our visits.  Complains of feet swelling and when walking up hill she start feeling hot and have chest pain.  She thinks I talk too much about exercise and not enough about her pain.  I asked her if this was something she felt we could try to work together on or would she prefer another doctor.  She would rather have another doctor.

## 2011-01-17 NOTE — Telephone Encounter (Addendum)
Front Office Please change PCP to Dr Armen Pickup  FYI Dr Armen Pickup and Hulen Luster   thanks

## 2011-01-23 ENCOUNTER — Other Ambulatory Visit: Payer: Self-pay | Admitting: Family Medicine

## 2011-01-23 ENCOUNTER — Telehealth: Payer: Self-pay | Admitting: Family Medicine

## 2011-01-23 DIAGNOSIS — Z1231 Encounter for screening mammogram for malignant neoplasm of breast: Secondary | ICD-10-CM

## 2011-01-23 NOTE — Telephone Encounter (Signed)
Pt informed that MD placed the order (chambliss) she already has appt set up [employer], [facility]

## 2011-01-23 NOTE — Telephone Encounter (Signed)
Pt needs a mammogram and needs a referral to go to [facility] since she has medicaid.

## 2011-02-09 ENCOUNTER — Ambulatory Visit (HOSPITAL_COMMUNITY)
Admission: RE | Admit: 2011-02-09 | Discharge: 2011-02-09 | Disposition: A | Discharge status: Home / Self Care | Payer: Medicaid Other | Source: Ambulatory Visit | Attending: Family Medicine | Admitting: Family Medicine

## 2011-02-09 DIAGNOSIS — Z1231 Encounter for screening mammogram for malignant neoplasm of breast: Secondary | ICD-10-CM | POA: Insufficient documentation

## 2011-03-06 ENCOUNTER — Ambulatory Visit (INDEPENDENT_AMBULATORY_CARE_PROVIDER_SITE_OTHER): Payer: Medicaid Other | Admitting: Family Medicine

## 2011-03-06 ENCOUNTER — Encounter: Payer: Self-pay | Admitting: Family Medicine

## 2011-03-06 VITALS — BP 148/91 | HR 76 | Temp 98.0°F | Ht 61.0 in | Wt 202.0 lb

## 2011-03-06 DIAGNOSIS — N390 Urinary tract infection, site not specified: Secondary | ICD-10-CM

## 2011-03-06 DIAGNOSIS — N760 Acute vaginitis: Secondary | ICD-10-CM

## 2011-03-06 LAB — POCT URINALYSIS DIPSTICK
Blood, UA: NEGATIVE
Glucose, UA: NEGATIVE
Ketones, UA: NEGATIVE
Leukocytes, UA: NEGATIVE
Nitrite, UA: NEGATIVE
Spec Grav, UA: >=1.03
Urobilinogen, UA: 2
pH, UA: 5.5

## 2011-03-06 LAB — POCT WET PREP (WET MOUNT): WBC, Wet Prep HPF POC: <5

## 2011-03-06 NOTE — Patient Instructions (Signed)
[name],  Thank you for coming in today.  Your exam is normal and reassuring.  Please come back if symptoms worsen or fail to improve within the next few days.  I will call you if your there is anything out of the ordinary on your wet prep.  -Dr. Armen Pickup

## 2011-03-06 NOTE — Progress Notes (Signed)
  Subjective:    Patient ID: Megan Miller, female    DOB: 1956-05-18, 55 y.o.   MRN: [id number]  HPI Pt is here to discuss dysuria concerning for UTI. Dysuria x 1 week with vaginal itching. She denies vaginal discharge. She is not sexually active. She denies N/V/fever.    Review of Systems As per HPI.     Objective:   Physical Exam  Nursing note and vitals reviewed. Constitutional: She appears well-developed and well-nourished. No distress.  HENT:  Head: Normocephalic and atraumatic.  Eyes: Conjunctivae are normal. Pupils are equal, round, and reactive to light.  Cardiovascular: Normal rate and regular rhythm.   Pulmonary/Chest: Effort normal and breath sounds normal.  Abdominal: Soft. Bowel sounds are normal. There is no tenderness. There is no CVA tenderness.  Genitourinary: Pelvic exam was performed with patient prone. There is no rash, tenderness or lesion on the right labia. There is no rash, tenderness or lesion on the left labia. Vaginal discharge found.       Thin white vaginal discharge appreciated.          Assessment & Plan:

## 2011-03-06 NOTE — Assessment & Plan Note (Addendum)
A: normal exam and UA. Obtained specimen for wet prep. Anticipate negative wet prep given normal appearance of fluid.  Differential: yeast vagnitis, atopic vaginitis. Vaginal discharge not characteristic of yeast. Vaginal and vulvar mucosa does not appear atrophic.  P: anticipate resolution of symptoms. Pt instructed to return if symptoms do not resolve.

## 2011-03-14 ENCOUNTER — Telehealth: Payer: Self-pay | Admitting: Family Medicine

## 2011-03-14 NOTE — Telephone Encounter (Signed)
Need referral to Dr. Mitzi Davenport for eye appt.  Please call when referral and appt has been confirmed

## 2011-03-15 ENCOUNTER — Telehealth: Payer: Self-pay | Admitting: Family Medicine

## 2011-03-15 NOTE — Telephone Encounter (Signed)
Called pt left VM. Need info for referral. Is this routine f/u or does she have a specific complaint? She did not mention needing a referral at our recent office visit.

## 2011-03-22 NOTE — Telephone Encounter (Signed)
Called pt back and informed that we had called and LMOVM for her to call back with the info.  States "ok well im calling back now"  Asked why she needed to see an eye MD states that she needs a check up.  When informed that her insurance would not cover that she say "oh my eyes are running"  Then asked if she wanted to see Dr. Mitzi Davenport, she states that she didn't know who that was but she wanted someone else.  Will send to MD Fleeger, [name]

## 2011-03-22 NOTE — Telephone Encounter (Signed)
Megan Miller has called back again about this referral.  She would really like to speak to a nurse.

## 2011-03-23 NOTE — Telephone Encounter (Signed)
Asked pt to make an appointment to evaluate eyes and discuss possible need for handicap sticker since neither of these issues were addressed at recent office visit.

## 2011-03-27 ENCOUNTER — Encounter: Payer: Self-pay | Admitting: Family Medicine

## 2011-03-27 ENCOUNTER — Ambulatory Visit (INDEPENDENT_AMBULATORY_CARE_PROVIDER_SITE_OTHER): Payer: Medicaid Other | Admitting: Family Medicine

## 2011-03-27 DIAGNOSIS — J309 Allergic rhinitis, unspecified: Secondary | ICD-10-CM | POA: Insufficient documentation

## 2011-03-27 DIAGNOSIS — H1045 Other chronic allergic conjunctivitis: Secondary | ICD-10-CM

## 2011-03-27 MED ORDER — NAPHAZOLINE-PHENIRAMINE 0.025-0.3 % OP SOLN: 1.0000 [drp] | Freq: Four times a day (QID) | OPHTHALMIC | Status: AC | PRN

## 2011-03-27 NOTE — Patient Instructions (Addendum)
Allergic Conjunctivitis The conjunctiva is a thin membrane that covers the visible white part of the eyeball and the underside of the eyelids. This membrane protects and lubricates the eye. The membrane has small blood vessels running through it that can normally be seen. When the conjunctiva becomes inflamed, the condition is called conjunctivitis. In response to the inflammation, the conjunctival blood vessels become swollen. The swelling results in redness in the normally white part of the eye. The blood vessels of this membrane also react when a person has allergies and is then called allergic conjunctivitis. This condition usually lasts for as long as the allergy persists. Allergic conjunctivitis cannot be passed to another person (non-contagious). The likelihood of bacterial infection is great and the cause is not likely due to allergies if the inflamed eye has:  A sticky discharge.   Discharge or sticking together of the lids in the morning.     Scaling or flaking of the eyelids where the eyelashes come out.     Red swollen eyelids.     CAUSES  Germs (bacteria).   Viruses.   Irritants such as foreign bodies.   Blunt injury.   Chemicals.   General allergic reactions.   Inflammation or serious diseases in the inside or the outside of the eye or the orbit (the boney cavity in which the eye sits) can cause a "red eye."  SYMPTOMS  Eye redness.   Tearing.   Itchy eyes.   Burning feeling in the eyes.   Clear drainage from the eye.   Allergic reaction due to pollens or ragweed sensitivity. Seasonal allergic conjunctivitis is frequent in the spring when pollens are in the air and in the fall.  DIAGNOSIS This condition, in its many forms, is usually diagnosed based on the history and an ophthalmological exam. It usually involves both eyes. If your eyes react at the same time every year, allergies may be the cause. While most "red eyes" are due to allergy or an infection, the  role of an eye (ophthalmological) exam is important. The exam can rule out serious diseases of the eye or orbit. TREATMENT  Non-antibiotic eye drops, ointments, or medications by mouth may be prescribed if the ophthalmologist is sure the conjunctivitis is due to allergies alone.   Over-the-counter drops and ointments for allergic symptoms should be used only after other causes of conjunctivitis have been ruled out, or as your caregiver suggests.  Medications by mouth are often prescribed if other allergy-related symptoms are present. If the ophthalmologist is sure that the conjunctivitis is due to allergies alone, treatment is normally limited to drops or ointments to reduce itching and burning. HOME CARE INSTRUCTIONS  Wash hands before and after applying drops or ointments, or touching the inflamed eye(s) or eyelids.   Stop using your soft contact lenses and throw them away. Use a new pair of lenses when recovery is complete. You should run through sterilizing cycles at least three times before use after complete recovery if the old soft contact lenses are to be used. Hard contact lenses should be stopped. They need to be thoroughly sterilized before use after recovery.   Do not let the eye dropper tip or ointment tube touch the eyelid when putting medicine in your eye.   Itching and burning eyes due to allergies is often relieved by using a cool cloth applied to closed eye(s).  SEEK MEDICAL CARE IF:    Your problems do not go away after two or three days of  treatment.   Your lids are sticky (especially in the morning when you wake up) or stick together.   Discharge develops. Antibiotics may be needed either as drops, ointment, or by mouth.   You have extreme light sensitivity.   An oral temperature above 101.4 F develops.   Pain in or around the eye or any other visual symptom develops.  MAKE SURE YOU:    Understand these instructions.   Will watch your condition.   Will get help  right away if you are not doing well or get worse.  Document Released: 10/06/2002 Document Re-Released: 01/03/2010 [facility] Patient Information 2011 [place], [place].  Megan Miller,  Thank you for coming in. What you have is a common problem. Please read the above information and use the eye drops to relieve the itching and redness. You may use the eye drops for up to 2 weeks. If you use them longer you may experience worsening redness.  -Dr. Armen Pickup

## 2011-03-27 NOTE — Progress Notes (Signed)
  Subjective:    Patient ID: Megan Miller, female    DOB: [birth year], 55 y.o.   MRN: [id number]  HPI Subjective:    Megan Miller is a 55 y.o. female who presents for evaluation of erythema, itching and tearing in both eyes. She has noticed the above symptoms for 2.5 weeks. Onset was gradual. Patient denies blurred vision, erythema, foreign body sensation, pain, photophobia and visual field deficit. There is a history of allergies.  The following portions of the patient's history were reviewed and updated as appropriate: allergies, current medications, past medical history, past social history and problem list.  Review of Systems Ears, nose, mouth, throat, and face: positive for runny nose, sneezing.   Objective:    BP 152/80  Pulse 78  Temp(Src) 98.1 F (36.7 C) (Oral)  Wt 201 lb 1.6 oz (91.218 kg)      General: alert, cooperative and appears stated age  Eyes:  positive findings: conjunctiva: 1+ injection  Vision: Not performed  Fluorescein:  not done     Assessment:    Acute conjunctivitis   Plan:    Discussed the diagnosis and proper care of conjunctivitis.  Stressed household Presenter, broadcasting. Ophthalmic drops per orders.     Review of Systems     Objective:   Physical Exam        Assessment & Plan:

## 2011-03-27 NOTE — Assessment & Plan Note (Signed)
A: Allergic conjunctivitis. Appears to occur yearly. P: Naphcon-A. Anticipate symptomatic improvement.

## 2011-04-09 ENCOUNTER — Telehealth: Payer: Self-pay | Admitting: Family Medicine

## 2011-04-09 MED ORDER — NAPHAZOLINE HCL 0.1 % OP SOLN: 1.0000 [drp] | Freq: Four times a day (QID) | OPHTHALMIC | Status: AC | PRN

## 2011-04-09 NOTE — Telephone Encounter (Signed)
Ms. Dunsworth need something else prescribed for her conjunctivitis that will be covered by medicaid.  Don't want to pay out of pocket

## 2011-04-09 NOTE — Telephone Encounter (Signed)
Sent in Naphcon A for allergic conjunctivitis.

## 2011-04-18 ENCOUNTER — Other Ambulatory Visit: Payer: Self-pay

## 2011-04-18 ENCOUNTER — Encounter: Payer: Self-pay | Admitting: Family Medicine

## 2011-04-18 ENCOUNTER — Ambulatory Visit (HOSPITAL_COMMUNITY)
Admission: RE | Admit: 2011-04-18 | Discharge: 2011-04-18 | Disposition: A | Discharge status: Home / Self Care | Payer: Medicaid Other | Source: Ambulatory Visit | Attending: Family Medicine | Admitting: Family Medicine

## 2011-04-18 ENCOUNTER — Ambulatory Visit (INDEPENDENT_AMBULATORY_CARE_PROVIDER_SITE_OTHER): Payer: Medicaid Other | Admitting: Family Medicine

## 2011-04-18 VITALS — BP 131/81 | HR 89 | Temp 97.9°F | Wt 199.0 lb

## 2011-04-18 DIAGNOSIS — R079 Chest pain, unspecified: Secondary | ICD-10-CM

## 2011-04-18 DIAGNOSIS — K5909 Other constipation: Secondary | ICD-10-CM

## 2011-04-18 DIAGNOSIS — Z23 Encounter for immunization: Secondary | ICD-10-CM

## 2011-04-18 MED ORDER — SENNA-DOCUSATE SODIUM 8.6-50 MG PO TABS: 2.0000 | ORAL_TABLET | Freq: Every day | ORAL | Status: AC

## 2011-04-18 NOTE — Patient Instructions (Signed)
Megan Miller,  Thank you for coming in today. I will send a referral to the cardiologist for your stress test. My nurses will contact you when the appointment is made. In the meantime, please report to the ED if your chest pain does not go away with rest or you have any fainting spells.   I will f/u your stress test and be in touch regarding the best course of action.   -Dr. Armen Pickup

## 2011-04-23 ENCOUNTER — Encounter: Payer: Self-pay | Admitting: Family Medicine

## 2011-04-23 NOTE — Progress Notes (Signed)
  Subjective:    Patient ID: Megan Miller, female    DOB: [birth year], 55 y.o.   MRN: [id number]  HPI Pt here to discuss the following: 1. Stomach pain x 1 week: not taking stool softeners. Last BM 3 days ago. Denies fever, chills, N/V/D, dysuria and vaginal discharge.  2. Chest pain: intermittent. Long history of complaint (see overview on problem list). Occurs when walking up hill. Associated with feeling hot, seating. Denies N/V, fainting and dizziness.  Taking nitro "sometimes". Last took it 4 days ago. Pain is substernal, heavy and moderate in severity, non-radiating.    Review of Systems As per HPI     Objective:   Physical Exam Gen: alert, NAD CV: S1S2 RRR, no MRG, no JVD, 2+ DP pulses, no LE edema       Assessment & Plan:

## 2011-04-23 NOTE — Assessment & Plan Note (Signed)
A: constipation causing abdominal pain. Not compliant with stool softeners.  P: sennokot-s, miralax, increase intake of fiber and water.

## 2011-04-23 NOTE — Assessment & Plan Note (Addendum)
A: Symptoms consistent with stable angina. P: referral for cardiac stress test to help determine if patient will require repeat NM myocardial perfusion ejection fraction study. Reviewed red flags to prompt patient to present to ED.

## 2011-04-24 ENCOUNTER — Other Ambulatory Visit: Payer: Self-pay | Admitting: Family Medicine

## 2011-04-24 NOTE — Telephone Encounter (Signed)
Refill request for Dr. Armen Pickup ' patient

## 2011-04-30 ENCOUNTER — Other Ambulatory Visit (HOSPITAL_COMMUNITY): Payer: Self-pay | Admitting: Interventional Cardiology

## 2011-05-01 ENCOUNTER — Telehealth: Payer: Self-pay | Admitting: Family Medicine

## 2011-05-01 NOTE — Telephone Encounter (Signed)
Megan Miller is calling to speak to the nurse because the Senokot makes her feel really bad and would like to have something else to take.  Also, she went and had a Stress test and was told not to take her BP pills and she would like to speak to a nurse about that as well.

## 2011-05-01 NOTE — Telephone Encounter (Signed)
Spoke with patient, she states someone from here called and told her to stop taking her BP medicine. I do not see any documentation of this, instructed patient to continue taking her BP medicine. Also patient states that the Senokot is not helping with her constipation and would like a different laxative called into her pharmacy. Message to MD

## 2011-05-02 MED ORDER — POLYETHYLENE GLYCOL 3350 17 GM/SCOOP PO POWD: 17.0000 g | Freq: Every day | ORAL | Status: AC

## 2011-05-02 NOTE — Telephone Encounter (Signed)
Sent in miralax. Called pt at home to discuss stress test/advice to stop BP meds. Left VM requesting pt call back. Asked pt to continue BP meds for the time being.

## 2011-05-10 ENCOUNTER — Encounter (HOSPITAL_COMMUNITY)
Admission: RE | Admit: 2011-05-10 | Discharge: 2011-05-10 | Disposition: A | Discharge status: Home / Self Care | Payer: Medicaid Other | Source: Ambulatory Visit | Attending: Interventional Cardiology | Admitting: Interventional Cardiology

## 2011-05-10 ENCOUNTER — Ambulatory Visit (HOSPITAL_COMMUNITY): Payer: Medicaid Other

## 2011-05-10 ENCOUNTER — Ambulatory Visit (HOSPITAL_COMMUNITY): Admission: RE | Admit: 2011-05-10 | Payer: Medicaid Other | Source: Ambulatory Visit

## 2011-05-10 DIAGNOSIS — R079 Chest pain, unspecified: Secondary | ICD-10-CM | POA: Insufficient documentation

## 2011-05-10 MED ORDER — TECHNETIUM TC 99M TETROFOSMIN IV KIT
10.0000 | PACK | Freq: Once | INTRAVENOUS | Status: AC | PRN
  Administered 2011-05-10: 10 via INTRAVENOUS

## 2011-05-10 MED ORDER — TECHNETIUM TC 99M TETROFOSMIN IV KIT
30.0000 | PACK | Freq: Once | INTRAVENOUS | Status: AC | PRN
  Administered 2011-05-10: 30 via INTRAVENOUS

## 2011-05-30 ENCOUNTER — Other Ambulatory Visit: Payer: Self-pay | Admitting: Family Medicine

## 2011-05-31 NOTE — Telephone Encounter (Signed)
Refill request

## 2011-06-25 ENCOUNTER — Encounter: Payer: Self-pay | Admitting: Family Medicine

## 2011-06-25 ENCOUNTER — Ambulatory Visit (INDEPENDENT_AMBULATORY_CARE_PROVIDER_SITE_OTHER): Payer: Medicaid Other | Admitting: Family Medicine

## 2011-06-25 VITALS — BP 159/87 | HR 81 | Temp 98.2°F | Ht 62.75 in | Wt 196.4 lb

## 2011-06-25 DIAGNOSIS — Z0000 Encounter for general adult medical examination without abnormal findings: Secondary | ICD-10-CM

## 2011-06-25 DIAGNOSIS — Z1211 Encounter for screening for malignant neoplasm of colon: Secondary | ICD-10-CM

## 2011-06-25 DIAGNOSIS — R252 Cramp and spasm: Secondary | ICD-10-CM

## 2011-06-25 LAB — BASIC METABOLIC PANEL
Calcium: 9.3 mg/dL (ref 8.4–10.5)
Creat: 0.79 mg/dL (ref 0.50–1.10)
Sodium: 141 mEq/L (ref 135–145)

## 2011-06-25 LAB — SEDIMENTATION RATE: Sed Rate: 40 mm/hr — ABNORMAL HIGH (ref 0–22)

## 2011-06-25 NOTE — Assessment & Plan Note (Addendum)
A: Unclear etiology. Patient does not appear to be dehydrated. Medications reviewed nondistended out for causing leg cramps. P: Patient instructed to rest, increase fluid intake, stretch. Tylenol/motin prn pain. Will obtain blood for a CK and BMP. Patient asked to followup symptoms worsen.

## 2011-06-25 NOTE — Patient Instructions (Addendum)
Ms. Darden,  Thank you for coming in today. Please read the information below regarding leg cramps. The most important things: drink plenty of fluid, rest your legs, stretch your legs, tylenol/motrin as needed for pain.  I will call you with the results of your blood work. My nurse will call you about your colonoscopy.  -Dr. Armen Pickup   Leg Cramps Leg cramps that occur during exercise can be caused by poor circulation or dehydration. However, muscle cramps that occur at rest or during the night are usually not due to any serious medical problem. Heat cramps may cause muscle spasms during hot weather.   CAUSES There is no clear cause for muscle cramps. However, dehydration may be a factor for those who do not drink enough fluids and those who exercise in the heat. Imbalances in the level of sodium, potassium, calcium or magnesium in the muscle tissue may also be a factor. Some medications, such as water pills (diuretics), may cause loss of chemicals that the body needs (like sodium and potassium) and cause muscle cramps. TREATMENT    Make sure your diet has enough fluids and essential minerals for the muscle to work normally.     Avoid strenuous exercise for several days if you have been having frequent leg cramps.     Stretch and massage the cramped muscle for several minutes.     Some medicines may be helpful in some patients with night cramps. Only take over-the-counter or prescription medicines as directed by your caregiver.  SEEK IMMEDIATE MEDICAL CARE IF:    Your leg cramps become worse.     Your foot becomes cold, numb, or blue.  Document Released: 08/23/2004 Document Revised: 03/28/2011 Document Reviewed: 08/10/2008 [facility] Patient Information 2012 [place], [place].Leg Cramps Leg cramps that occur during exercise can be caused by poor circulation or dehydration. However, muscle cramps that occur at rest or during the night are usually not due to any serious medical problem. Heat  cramps may cause muscle spasms during hot weather.   CAUSES There is no clear cause for muscle cramps. However, dehydration may be a factor for those who do not drink enough fluids and those who exercise in the heat. Imbalances in the level of sodium, potassium, calcium or magnesium in the muscle tissue may also be a factor. Some medications, such as water pills (diuretics), may cause loss of chemicals that the body needs (like sodium and potassium) and cause muscle cramps. TREATMENT    Make sure your diet has enough fluids and essential minerals for the muscle to work normally.     Avoid strenuous exercise for several days if you have been having frequent leg cramps.     Stretch and massage the cramped muscle for several minutes.     Some medicines may be helpful in some patients with night cramps. Only take over-the-counter or prescription medicines as directed by your caregiver.  SEEK IMMEDIATE MEDICAL CARE IF:    Your leg cramps become worse.     Your foot becomes cold, numb, or blue.  Document Released: 08/23/2004 Document Revised: 03/28/2011 Document Reviewed: 08/10/2008 [facility] Patient Information 2012 [place], [place].

## 2011-06-25 NOTE — Progress Notes (Addendum)
  Subjective:    Patient ID: Megan Miller, female    DOB: 1956-02-08, 55 y.o.   MRN: [id number]  HPI Megan Miller 55 year old female presents with complaint of left left lower extremely cramping x5 days. She also request referral for colonoscopy she cardio GI so that she needed a referral from her PCPs office.  1. left lower extremity cramping: X5 days. No weakness, tingling, or numbness in the extremity. Patient denies fevers, chills, rash. No symptoms on the right side. Cramping is intermittent lasting 1-2 minutes. Cramping is 7/10 in severity. She denies pain currently. She denies taking over-the-counter medication. She denies muscle cramping any other body part. She endorses increase walking last few days mostly during shopping.   Review of Systems Pertinent review of systems as per history of present illness.    Objective:   Physical Exam Gen: Adult female in no acute distress HEENT: Moist mucous membranes Lower extremities: -Inspection: No rash swelling or redness -Palpation: Nontender to palpation -Strength: 5 out of 5 strength in dorsi and plantarflexion. Patient able to weight-bear without difficult -Sensation: Full and equal bilaterally -Range of motion: Full        Assessment & Plan:

## 2011-06-26 ENCOUNTER — Other Ambulatory Visit: Payer: Self-pay | Admitting: Family Medicine

## 2011-06-26 ENCOUNTER — Telehealth: Payer: Self-pay | Admitting: Family Medicine

## 2011-06-26 MED ORDER — CYCLOBENZAPRINE HCL 10 MG PO TABS: 10.0000 mg | ORAL_TABLET | Freq: Three times a day (TID) | ORAL | Status: AC | PRN

## 2011-06-26 NOTE — Telephone Encounter (Signed)
Labs reviewed- all normal. Patient still having pain. Will send in muscle relaxer. Patient will cancel tomorrow's appointment and try muscle relaxer.

## 2011-06-27 ENCOUNTER — Ambulatory Visit: Payer: Medicaid Other | Admitting: Family Medicine

## 2011-06-29 ENCOUNTER — Encounter: Payer: Self-pay | Admitting: Internal Medicine

## 2011-06-29 NOTE — Progress Notes (Signed)
Addended by: Dessa Phi on: 06/29/2011 08:31 AM   Modules accepted: Orders

## 2011-07-09 ENCOUNTER — Telehealth: Payer: Self-pay | Admitting: Family Medicine

## 2011-07-09 NOTE — Telephone Encounter (Signed)
This is a Funches pt

## 2011-07-09 NOTE — Telephone Encounter (Signed)
Is taking pill for her muscle spasms and wants to know if there is a cream she can try [facility]

## 2011-07-10 NOTE — Telephone Encounter (Signed)
Pt called back to see what else she can use.  Other meds are not working.

## 2011-07-10 NOTE — Telephone Encounter (Signed)
Called pt left VM. No good cream for muscle spasms. There are topical NSAIDs. Asked pt to call back to discuss symptoms.

## 2011-07-12 MED ORDER — METHOCARBAMOL 750 MG PO TABS: 1500.0000 mg | ORAL_TABLET | Freq: Three times a day (TID) | ORAL | Status: DC

## 2011-07-12 NOTE — Telephone Encounter (Signed)
Called patient. Unable to speak to her directly. Left VM. Willing to try robaxin. Will send in a few as a trial. Pt should schedule an appt with me to discuss.

## 2011-07-12 NOTE — Telephone Encounter (Signed)
Addended by: Dessa Phi on: 07/12/2011 02:07 PM   Modules accepted: Orders

## 2011-07-17 ENCOUNTER — Other Ambulatory Visit: Payer: Self-pay | Admitting: Family Medicine

## 2011-07-17 NOTE — Telephone Encounter (Signed)
Refill request

## 2011-07-19 ENCOUNTER — Encounter: Payer: Self-pay | Admitting: Family Medicine

## 2011-07-19 DIAGNOSIS — R079 Chest pain, unspecified: Secondary | ICD-10-CM

## 2011-07-20 ENCOUNTER — Other Ambulatory Visit: Payer: Medicaid Other | Admitting: Internal Medicine

## 2011-07-25 ENCOUNTER — Telehealth: Payer: Self-pay | Admitting: *Deleted

## 2011-07-25 NOTE — Telephone Encounter (Signed)
Pt stated that the trail medication Robaxin is not working for her. She feels that maybe she should have an xray done and can something else be called in for her? Looked back at last note and pt was asked to make an appt to further discuss medication. She has an appt 01.07.2013.Laureen Ochs, Viann Shove

## 2011-07-29 ENCOUNTER — Other Ambulatory Visit: Payer: Self-pay | Admitting: Family Medicine

## 2011-07-30 NOTE — Telephone Encounter (Signed)
Refill request

## 2011-08-03 ENCOUNTER — Encounter (HOSPITAL_COMMUNITY): Payer: Self-pay | Admitting: *Deleted

## 2011-08-03 ENCOUNTER — Emergency Department (HOSPITAL_COMMUNITY)
Admission: EM | Admit: 2011-08-03 | Discharge: 2011-08-03 | Disposition: A | Discharge status: Home / Self Care | Payer: Medicaid Other | Attending: Emergency Medicine | Admitting: Emergency Medicine

## 2011-08-03 DIAGNOSIS — M79669 Pain in unspecified lower leg: Secondary | ICD-10-CM

## 2011-08-03 DIAGNOSIS — M79609 Pain in unspecified limb: Secondary | ICD-10-CM | POA: Insufficient documentation

## 2011-08-03 DIAGNOSIS — I10 Essential (primary) hypertension: Secondary | ICD-10-CM | POA: Insufficient documentation

## 2011-08-03 DIAGNOSIS — K219 Gastro-esophageal reflux disease without esophagitis: Secondary | ICD-10-CM | POA: Insufficient documentation

## 2011-08-03 DIAGNOSIS — Z9889 Other specified postprocedural states: Secondary | ICD-10-CM | POA: Insufficient documentation

## 2011-08-03 DIAGNOSIS — Z7982 Long term (current) use of aspirin: Secondary | ICD-10-CM | POA: Insufficient documentation

## 2011-08-03 DIAGNOSIS — Z79899 Other long term (current) drug therapy: Secondary | ICD-10-CM | POA: Insufficient documentation

## 2011-08-03 LAB — BASIC METABOLIC PANEL WITH GFR
BUN: 20 mg/dL (ref 6–23)
CO2: 23 meq/L (ref 19–32)
Calcium: 9 mg/dL (ref 8.4–10.5)
Chloride: 106 meq/L (ref 96–112)
Creatinine, Ser: 0.89 mg/dL (ref 0.50–1.10)
GFR calc Af Amer: 82 mL/min — ABNORMAL LOW
GFR calc non Af Amer: 71 mL/min — ABNORMAL LOW
Glucose, Bld: 97 mg/dL (ref 70–99)
Potassium: 3.9 meq/L (ref 3.5–5.1)
Sodium: 139 meq/L (ref 135–145)

## 2011-08-03 LAB — D-DIMER, QUANTITATIVE: D-Dimer, Quant: 0.36 ug{FEU}/mL (ref 0.00–0.48)

## 2011-08-03 MED ORDER — OXYCODONE-ACETAMINOPHEN 5-325 MG PO TABS
2.0000 | ORAL_TABLET | ORAL | Status: DC | PRN
Start: 2011-08-03 — End: 2011-08-10

## 2011-08-03 MED ORDER — MORPHINE SULFATE 4 MG/ML IJ SOLN
4.0000 mg | Freq: Once | INTRAMUSCULAR | Status: AC
  Administered 2011-08-03: 4 mg via INTRAMUSCULAR
  Filled 2011-08-03: qty 1

## 2011-08-03 MED ORDER — ONDANSETRON 4 MG PO TBDP
4.0000 mg | ORAL_TABLET | Freq: Once | ORAL | Status: AC
  Administered 2011-08-03: 4 mg via ORAL
  Filled 2011-08-03: qty 1

## 2011-08-03 NOTE — ED Notes (Signed)
Pt presents with 2 month h/o L leg cramping.  Pt reports she drank Ensure 2 months ago, developed abdominal pain that is now resolved, but began having cramping to L leg.  Pt points to calf as to location of pain, unsure of swelling.

## 2011-08-03 NOTE — ED Provider Notes (Signed)
History     CSN: [id number]  Arrival date & time 08/03/11  1113   First MD Initiated Contact with Patient 08/03/11 1321      Chief Complaint  Patient presents with  . Leg Pain    (Consider location/radiation/quality/duration/timing/severity/associated sxs/prior treatment) Patient is a 56 y.o. female presenting with leg pain. The history is provided by the patient.  Leg Pain    the patient is a 49, old female, with a history of hypertension.  She complains of nontraumatic left calf pain for a few weeks.  She says it is constant, and never goes away.  It is worse.  If she walks.  She denies chest pain, shortness of breath, cough.  She denies fevers, or chills.  She has not had recent trips or surgery.  She has not had this before.  Past Medical History  Diagnosis Date  . Allergic conjunctivitis   . Leg edema     Asymetric Right leg (dopplers negative)  . Chronic back pain   . Chest pain   . Constipation, chronic   . Dyspnea on exertion   . Dysthymic   . GERD (gastroesophageal reflux disease)   . Hypertension   . Overweight (BMI 25.0-29.9)   . Urination frequency     Past Surgical History  Procedure Date  . Bilateral salpingoophorectomy 05/08/2001    Path=extensive endometriosis  . Abdominal hysterectomy 09/28/1999    for leimyomas  . Breast surgery 12/29/2003    Right breast core biopsy=benign fibrocystic change  . Small intestine surgery 03/30/2001    Rectosigmoid bowel resection and reanastomosis    Family History  Problem Relation Age of Onset  . Hypertension Mother   . Diabetes Father   . Diabetes Sister   . Asthma Brother   . Diabetes Brother     History  Substance Use Topics  . Smoking status: Never Smoker   . Smokeless tobacco: Not on file  . Alcohol Use: No    OB History    Grav Para Term Preterm Abortions TAB SAB Ect Mult Living                  Review of Systems  Constitutional: Negative for fever and chills.  Respiratory: Negative for  cough and shortness of breath.   Cardiovascular: Negative for chest pain and palpitations.  Gastrointestinal: Negative for nausea and vomiting.  Musculoskeletal: Negative for myalgias and gait problem.       Left calf pain No swelling, rash.  Color, change  Skin: Negative for rash and wound.  Neurological: Negative for weakness.  All other systems reviewed and are negative.    Allergies  Codeine phosphate  Home Medications   Current Outpatient Rx  Name Route Sig Dispense Refill  . ASPIRIN 81 MG PO TABS Oral Take 81 mg by mouth daily.      Marland Kitchen CARVEDILOL 25 MG PO TABS Oral Take 25 mg by mouth 2 (two) times daily with a meal.      . CITALOPRAM HYDROBROMIDE 20 MG PO TABS Oral Take 20 mg by mouth daily. For depression     . LISINOPRIL 20 MG PO TABS Oral Take 20 mg by mouth daily.      . OLOPATADINE HCL 0.1 % OP SOLN Both Eyes Place 1 drop into both eyes 2 (two) times daily. For allergy symptoms     . RABEPRAZOLE SODIUM 20 MG PO TBEC Oral Take 20 mg by mouth daily.      Raul Del SODIUM  8.6-50 MG PO TABS Oral Take 2 tablets by mouth daily. 60 tablet 3  . TRAZODONE HCL 50 MG PO TABS Oral Take 50 mg by mouth at bedtime as needed. For sleep    . TRIAMCINOLONE ACETONIDE 0.1 % EX CREA Topical Apply topically 2 (two) times daily. Use sparingly over affected areas     . NITROGLYCERIN 0.4 MG SL SUBL Sublingual Place 0.4 mg under the tongue every 5 (five) minutes as needed. For chest pain      BP 181/92  Pulse 88  Temp(Src) 97.8 F (36.6 C) (Oral)  Resp 18  SpO2 99%  Physical Exam  Constitutional: She is oriented to person, place, and time. She appears well-developed and well-nourished.  HENT:  Head: Normocephalic and atraumatic.  Eyes: Pupils are equal, round, and reactive to light.  Neck: Normal range of motion.  Pulmonary/Chest: No respiratory distress.  Abdominal: She exhibits no distension.  Musculoskeletal: Normal range of motion. She exhibits tenderness. She exhibits no  edema.       Left calf, tenderness No swelling, ecchymosis, rash, no tenderness to the left thigh or foot Left knee is without effusion or tenderness or ligamentous instability  Neurological: She is alert and oriented to person, place, and time. No cranial nerve deficit.  Skin: Skin is warm and dry. No rash noted. No erythema.  Psychiatric: She has a normal mood and affect. Her behavior is normal.    ED Course  Procedures (including critical care time) 56 year old female, with left calf pain for several weeks, with no edema.  No signs of infection.  There is no history of trauma.  She has no risk factors for a DVT.  She has no symptoms consistent with a PE.  We will do a d-dimer, and chemistry, to look for evidence of a DVT.  Labs Reviewed - No data to display No results found.   No diagnosis found.  2:59 PM Pain controlled  MDM  Left calf pain No signs, DVT.  No signs of infection.  No history of trauma.       Nicholes Stairs, MD 08/03/11 1459

## 2011-08-03 NOTE — ED Notes (Signed)
Pt. Has c/o pain after drinking an Ensure. Pt. reports that the pain was initially in her abdomen but went away.  Pt. Now has a cramp in her left leg.  Pt. denies injury, falls, or any other issues.

## 2011-08-03 NOTE — Telephone Encounter (Signed)
Called pt to f/u leg cramps. See pt has appt scheduled for next Monday. Left VM telling pt I will see her on Monday. If robaxin is still not helping she should try at home remedies, alternating heat and ice/epsom salt baths.

## 2011-08-06 ENCOUNTER — Telehealth: Payer: Self-pay | Admitting: *Deleted

## 2011-08-06 NOTE — Telephone Encounter (Signed)
Patient calling requesting a PCP change.  Expressing dissatisfaction regarding current PCP not returning her phone calls.  I will route note to Dr. Armen Pickup and ask that she call patient to discuss.  If issues cannot be resolved then I will change.

## 2011-08-07 NOTE — Telephone Encounter (Signed)
Called pt. Left VM. Asked pt to call back so we could discuss concerns. If she still desires change in PCP that will be ok with me.

## 2011-08-10 ENCOUNTER — Encounter: Payer: Self-pay | Admitting: Family Medicine

## 2011-08-10 ENCOUNTER — Ambulatory Visit (INDEPENDENT_AMBULATORY_CARE_PROVIDER_SITE_OTHER): Payer: Medicaid Other | Admitting: Family Medicine

## 2011-08-10 VITALS — BP 169/91 | HR 88 | Temp 98.2°F | Ht 62.75 in | Wt 197.0 lb

## 2011-08-10 DIAGNOSIS — R252 Cramp and spasm: Secondary | ICD-10-CM

## 2011-08-10 NOTE — Assessment & Plan Note (Addendum)
A: Etiology still unclear. So far pt has  normal electrolytes (K, Ca), unresponsive to opioids and muscles relaxer. The only medications that could be causing myalgias, coreg and trazodone, are both long term medications. Given her normal function, I do not suspect a progressive or life threatening process. This may just be idiopathic cramping.  P:  -reassurance, pt appears anxious about the pain. -image, will start with x-rays. I have explained to her that this will not show much information about the soft tissue but will evaluate the bones in her lower leg and knee.  -conservative management (tylenol, motrin, warm or cool compress), exercise, fluid intake (water).  -f/u as needed.  -consider checking CPK and ESR at f/u. Give detailed exercise program (for leg cramps) and discourage escalating to stronger medications that will likely not improve pain.

## 2011-08-10 NOTE — Patient Instructions (Addendum)
Ms. Strycharz,  Thank you for coming in to see me today.   Although I do not know the etiology of your cramping. I do want you to be reassured that it does not seem to be anything life threatening or progressive.  Please present to [facility] radiology you may enter through short stay for x-rays. The x-rays may not show as much since the cramping is in her muscles and it mostly gives information about the bones. I will call you with the results and send a report.  In the meantime take Motrin and even alternate this with Tylenol as needed for her pain.  You may also alternate warm and cool compresses as needed for pain.  Dr. Armen Pickup

## 2011-08-13 NOTE — Telephone Encounter (Signed)
The visit went well. Her affect was flat but that is her baseline. We did not discuss PCP change. She wanted x-rays. I discussed that they may not be helpful for diagnosis but agreed that they may help alleviate her anxiety so I ordered them. She seem satisfied. I am having a hard time establishing what I feel is a good therapeutic relationship with her. I do not know if another provided would have better luck. I am a happy to keep seeing her and make a better effort to return calls and relay information in a more timely manner, within reason. However, if she calls back requesting a PCP change I think a change would be best.

## 2011-08-13 NOTE — Telephone Encounter (Signed)
Megan Miller, How did your visit with Megan Miller go this past Friday?  Do you still think she wants to change doctors?

## 2011-08-14 ENCOUNTER — Ambulatory Visit (HOSPITAL_COMMUNITY)
Admission: RE | Admit: 2011-08-14 | Discharge: 2011-08-14 | Disposition: A | Discharge status: Home / Self Care | Payer: Medicaid Other | Source: Ambulatory Visit | Attending: Family Medicine | Admitting: Family Medicine

## 2011-08-14 DIAGNOSIS — R252 Cramp and spasm: Secondary | ICD-10-CM | POA: Insufficient documentation

## 2011-08-14 DIAGNOSIS — M25569 Pain in unspecified knee: Secondary | ICD-10-CM | POA: Insufficient documentation

## 2011-08-14 DIAGNOSIS — M79609 Pain in unspecified limb: Secondary | ICD-10-CM | POA: Insufficient documentation

## 2011-08-15 ENCOUNTER — Telehealth: Payer: Self-pay | Admitting: Family Medicine

## 2011-08-15 ENCOUNTER — Encounter: Payer: Self-pay | Admitting: Family Medicine

## 2011-08-15 MED ORDER — CARVEDILOL 25 MG PO TABS: 25.0000 mg | ORAL_TABLET | Freq: Two times a day (BID) | ORAL | Status: DC

## 2011-08-15 NOTE — Telephone Encounter (Signed)
Called patient home and left voicemail stating that Ireviewed her x-rays. I will have a copy of the x-ray report sent to her at home. I informed her that her leg x-ray was normal. I also informed her that her knee x-ray showed some mild changes consistent with mild arthritis. I stated that this mild arthritis is likely not the source of her pain. I informed her that she should continue conservative management and followup as needed with Dr. Mikel Cella, who is her new PCP.

## 2011-08-16 ENCOUNTER — Telehealth: Payer: Self-pay | Admitting: Family Medicine

## 2011-08-16 NOTE — Telephone Encounter (Signed)
Patient was seen by Dr. Armen Pickup. As explained to the patient by Dr. Armen Pickup, this is best treated with conservative treatment including Motrin alternated with Tylenol. X-ray indicates nothing serious. She can use heat as needed as well. I will see her for her next follow up as her new PCP, but this pain has been evaluated well.  Please let patient know that she should continue over the counter conservative treatment. Thank you!  Court Gracia M. Dinesh Ulysse, M.D.

## 2011-08-16 NOTE — Progress Notes (Signed)
Subjective:     Patient ID: Megan Miller, female   DOB: 1956/06/19, 56 y.o.   MRN: [id number]  HPI Pt returns to f/u left lower extremity pain. The lateral aspect of her left leg has pained her now for two months. Since her last visit she has been treated in the ED for the pain. She was prescribed percocet which se did not tolerate due to nausea and itching. She states that the flexeril did not improve her pain. She denies trauma to her leg. She describes the pain as severe, intermittent cramping. It will last for hours, resolve for days and return. The only change in activitywas an exercise stress test about 3 months ago. She denies thigh or foot pain. She denies myalgias in any other area. She denies fever, chills or weight loss. She would like to have an x-ray of her leg.   Review of Systems Pertinent ROS as per HPI    Objective:   Physical Exam GEN: adult female in NAD. Flat affect.  Left lower extremity: normal gait. Normal strength in LLE. Non tender to palpation. No erythema, edema, rash, or skin lesions.     Assessment:         Plan:

## 2011-08-16 NOTE — Telephone Encounter (Signed)
Pt has appt tomorrow. Fleeger, [name]  

## 2011-08-16 NOTE — Telephone Encounter (Signed)
Patient came in to see Dr. Armen Pickup the other day for arthiritis in her leg.  She said she was told to call Dr. Mikel Cella for medication for this.  Regardless, she would like some medication and I am not sure if this needs to go to [facility] or [place].

## 2011-08-17 ENCOUNTER — Ambulatory Visit (INDEPENDENT_AMBULATORY_CARE_PROVIDER_SITE_OTHER): Payer: Medicaid Other | Admitting: Family Medicine

## 2011-08-17 ENCOUNTER — Encounter: Payer: Self-pay | Admitting: Family Medicine

## 2011-08-17 VITALS — BP 180/90 | HR 84 | Ht 62.75 in | Wt 193.6 lb

## 2011-08-17 DIAGNOSIS — M25562 Pain in left knee: Secondary | ICD-10-CM

## 2011-08-17 DIAGNOSIS — M25569 Pain in unspecified knee: Secondary | ICD-10-CM

## 2011-08-17 MED ORDER — NAPROXEN 375 MG PO TABS: 375.0000 mg | ORAL_TABLET | Freq: Two times a day (BID) | ORAL | Status: DC

## 2011-08-17 MED ORDER — CALCIUM-VITAMIN D 250-125 MG-UNIT PO TABS: 1.0000 | ORAL_TABLET | Freq: Every day | ORAL | Status: DC

## 2011-08-17 NOTE — Patient Instructions (Signed)
It was nice to meet you today!  We will get your MRI scheduled. After we get the results from that, come back and see me to discuss options. Either to get an injection in your knee, or refer you to a specialist.  Take the Naprosyn twice daily as needed for pain. You can continue to take Tylenol every 8 hours as well.  I sent in a prescription for Os-cal to take daily.  Make an appointment to come back and see me in a few weeks. (After your MRI is completed.)  Take care! Allyce Bochicchio M. Brayden Betters, M.D.

## 2011-08-19 ENCOUNTER — Encounter: Payer: Self-pay | Admitting: Family Medicine

## 2011-08-19 DIAGNOSIS — M25562 Pain in left knee: Secondary | ICD-10-CM | POA: Insufficient documentation

## 2011-08-19 NOTE — Progress Notes (Signed)
Subjective:     Patient ID: Megan Miller, female   DOB: 04/21/1956, 56 y.o.   MRN: [id number]  HPI   Review of Systems Patient is a 56 year old female presenting as a new patient to me for evaluation of left knee pain. Patient has been previously evaluated by another physician in clinic and has had multiple lab tests, as well as x-rays which were all relatively normal. She did have a bit of osteoarthritis noted on x-ray in her left knee was recommended to take Tylenol and Advil daily for this pain. Patient states this has not helped and she would like a second opinion. Patient states that left knee pain started with acute onset approximately 2 months ago. She does remember any trauma to the area. The pain has not changed, but it is made worse with walking. In the past. She has tried narcotic medication, which she could not tolerate. She's also tried heat, ice, NSAIDs, with little relief. Patient denies any swelling or redness of her left knee. She's not had any fevers or rashes. Her pain is currently 8/10 on a daily basis. She now describes it as "unbearable". She is hoping to get further evaluation of her knee today to help her understand why the pain is so severe.  ROS: Please see HPI above. ROS otherwise negative.    Objective:   Physical Exam  Constitutional: She appears well-developed and well-nourished. No distress.  Cardiovascular: Normal rate and regular rhythm.   Pulmonary/Chest: Effort normal and breath sounds normal.  Abdominal: Soft. There is no tenderness.  Lymphadenopathy:    She has no cervical adenopathy.  Skin: Skin is warm. No rash noted.   Thorough knee exam was performed with Dr. Denny Levy, attending physician.  Left Knee: Normal to inspection with no erythema or effusion or obvious bony abnormalities, as compared with right knee Palpation normal with no warmth or joint line tenderness or patellar tenderness or condyle tenderness. ROM normal in flexion and extension  and lower leg rotation. Ligaments with solid consistent endpoints including ACL, PCL, LCL, MCL. Positive Mcmurray's and provocative meniscal tests. Non painful patellar compression. Patellar and quadriceps tendons unremarkable. Hamstring and quadriceps strength is normal. No evidence of Baker's cyst      Assessment:     56 yo F presenting with persistent left knee pain, likely to have a mensicus tear as evidenced by physical exam    Plan:

## 2011-08-19 NOTE — Assessment & Plan Note (Addendum)
Patient is likely to have a meniscus tear, based on physical exam performed with Dr. Jennette Kettle. The options for her at this time are to have an MRI of her knee, and patient states she is very interested in this. After we get the results of the MRI, based on what the report says we can consider doing local injections and/or send her to ortho to consider arthroscopy. For current pain, I have given her Naprosyn to take daily as needed. Patient is very pleased with this plan and seems optimistic to find out what is wrong with her knee. If she has any changes in her pain, gait, or notices swelling or redness, she should return for additional evaluation. Also, patient states she feels her bones are "popping." She does not take a daily calcium supplement. I have given her Os-cal to pick up at the pharmacy.

## 2011-08-20 ENCOUNTER — Ambulatory Visit
Admission: RE | Admit: 2011-08-20 | Discharge: 2011-08-20 | Disposition: A | Discharge status: Home / Self Care | Payer: Medicaid Other | Source: Ambulatory Visit | Attending: Family Medicine | Admitting: Family Medicine

## 2011-08-20 DIAGNOSIS — M25562 Pain in left knee: Secondary | ICD-10-CM

## 2011-08-23 HISTORY — PX: COLONOSCOPY: SHX174

## 2011-08-27 ENCOUNTER — Telehealth: Payer: Self-pay | Admitting: Family Medicine

## 2011-08-27 ENCOUNTER — Other Ambulatory Visit: Payer: Self-pay | Admitting: Family Medicine

## 2011-08-27 DIAGNOSIS — S83289A Other tear of lateral meniscus, current injury, unspecified knee, initial encounter: Secondary | ICD-10-CM

## 2011-08-27 NOTE — Telephone Encounter (Signed)
Advised pt of tear in knee. Pt is agreeable to being referred to ortho.

## 2011-08-27 NOTE — Progress Notes (Signed)
Patient with bucket handle tear of lateral meniscus. Patient agreeable to ortho referral. Will send to [facility].  Moxie Kalil M. Granvel Proudfoot, M.D.

## 2011-09-03 ENCOUNTER — Encounter: Payer: Self-pay | Admitting: Family Medicine

## 2011-09-03 NOTE — Progress Notes (Signed)
Patient ID: Megan Miller, female   DOB: 04/06/1956, 56 y.o.   MRN: [id number]

## 2011-09-04 ENCOUNTER — Telehealth: Payer: Self-pay | Admitting: *Deleted

## 2011-09-04 NOTE — Telephone Encounter (Signed)
Called and left message on voicemail for patient to call back and schedule an appointment for surgical clearance.

## 2011-09-04 NOTE — Telephone Encounter (Signed)
Message copied by Aram Beecham on Tue Sep 04, 2011  9:18 AM ------      Message from: [place], [place] M      Created: Mon Sep 03, 2011  5:59 PM       I received a message from [facility] to send a surgical clearance note. I do not feel like I have enough information to complete this. Can you please call the patient and have her make an appointment to do pre-op clearance? (Unless other providers do something different??)            Thank you!!!      Hospital doctor

## 2011-09-10 ENCOUNTER — Encounter: Payer: Self-pay | Admitting: Family Medicine

## 2011-09-10 ENCOUNTER — Ambulatory Visit (INDEPENDENT_AMBULATORY_CARE_PROVIDER_SITE_OTHER): Payer: Medicaid Other | Admitting: Family Medicine

## 2011-09-10 ENCOUNTER — Ambulatory Visit (HOSPITAL_COMMUNITY)
Admission: RE | Admit: 2011-09-10 | Discharge: 2011-09-10 | Disposition: A | Discharge status: Home / Self Care | Payer: Medicaid Other | Source: Ambulatory Visit | Attending: Family Medicine | Admitting: Family Medicine

## 2011-09-10 ENCOUNTER — Other Ambulatory Visit: Payer: Self-pay

## 2011-09-10 VITALS — BP 142/80 | HR 60 | Temp 98.0°F | Ht 61.0 in | Wt 197.0 lb

## 2011-09-10 DIAGNOSIS — M25569 Pain in unspecified knee: Secondary | ICD-10-CM

## 2011-09-10 DIAGNOSIS — Z01818 Encounter for other preprocedural examination: Secondary | ICD-10-CM

## 2011-09-10 DIAGNOSIS — M25562 Pain in left knee: Secondary | ICD-10-CM

## 2011-09-10 DIAGNOSIS — R079 Chest pain, unspecified: Secondary | ICD-10-CM | POA: Insufficient documentation

## 2011-09-10 NOTE — Progress Notes (Signed)
Patient ID: Megan Miller, female   DOB: 1956-02-04, 56 y.o.   MRN: [id number]  Subjective:    Megan Miller is a 56 y.o. female who presents to the office today for a preoperative consultation at the request of surgeon Dr. Luiz Blare ([facility]) who plans on performing left knee arthroscopy on February 13. This consultation is requested for the specific conditions prompting preoperative evaluation (i.e. because of potential affect on operative risk): HTN, GERD. Planned anesthesia: general. The patient has the following known anesthesia issues: None. Patients bleeding risk: no recent abnormal bleeding and no use of Ca-channel blockers. Patient does not have objections to receiving blood products if needed.  Patient has no current cardiac conditions including CHF or CAD. She has functional capacity >4METs and is limited only by her knee pain.   The following portions of the patient's history were reviewed and updated as appropriate: allergies, current medications, past family history, past medical history, past social history, past surgical history and problem list.  Review of Systems Constitutional: negative for chills and fevers Eyes: negative, no changes in vision Respiratory: negative for dyspnea on exertion Cardiovascular: negative for chest pain, chest pressure/discomfort and palpitations Gastrointestinal: negative, no N/V, diarrhea or constipation Hematologic/lymphatic: negative for bleeding Musculoskeletal: negative except for left knee pain Neurological: negative for dizziness and weakness Behavioral/Psych: negative for SI,HI    Objective:    BP 142/80  Pulse 60  Temp(Src) 98 F (36.7 C) (Oral)  Ht 5\' 1"  (1.549 m)  Wt 197 lb (89.359 kg)  BMI 37.22 kg/m2  General Appearance:    Alert, cooperative, no distress, appears stated age  Head:    Normocephalic, without obvious abnormality, atraumatic  Eyes:    PERRL, conjunctiva/corneas clear, EOM's intact  Ears:    Normal  TM's and external ear canals, both ears  Nose:   Nares normal, septum midline, mucosa normal, no drainage    or sinus tenderness  Throat:   Lips, mucosa, and tongue normal; missing incisors as well as multiple molars   Neck:   Supple, no adenopathy;    thyroid:  no enlargement/tenderness/nodules; no carotid   bruit  Back:     no CVA tenderness  Lungs:     Clear to auscultation bilaterally, respirations unlabored   Heart:    Regular rate and rhythm, S1 and S2 normal, no murmur, rub   or gallop  Abdomen:     Soft, non-tender, bowel sounds active all four quadrants,   Extremities:   Extremities normal, atraumatic, no cyanosis or edema  Pulses:   2+ and symmetric all extremities  Skin:   Skin color, texture, turgor normal, no rashes or lesions  Lymph nodes:   Cervical, supraclavicularnodes normal  Neurologic:   CNII-XII intact, normal strength, sensation and reflexes    throughout    Predictors of intubation difficulty:  Morbid obesity? BMI 37  Anatomically abnormal facies? no  Prominent incisors? no  Receding mandible? no  Short, thick neck? no  Neck range of motion: normal  Dentition: Missing teeth (front and molar)  Cardiographics ECG: normal sinus rhythm, no blocks or conduction defects, no ischemic changes  Imaging Chest x-ray: Not done.   Lab Review    08/03/2011 13:41  Sodium 139  Potassium 3.9  Chloride 106  CO2 23  BUN 20  Creat 0.89  Calcium 9.0  GFR calc non Af Amer 71 (L)  GFR calc Af Amer 82 (L)  Glucose 97     Assessment:  56 y.o. female with planned surgery as above.   Known risk factors for perioperative complications: None  Difficulty with intubation is anticipated.  Cardiac Risk Estimation: Per ACC/AHA 2007 Perioperative Guidelines, patient should proceed with planned surgery.  Current medications which may produce withdrawal symptoms if withheld perioperatively: None    Plan:   1. Preoperative workup as follows: ECG. (within normal  limits) 2. Change in medication regimen before surgery: none, continue medication regimen including morning of surgery, with sip of water. 3. Prophylaxis for cardiac events with perioperative beta-blockers: not indicated. (Patient on Carvedilol which should be continued. 4. Invasive hemodynamic monitoring perioperatively: not indicated. 5. Deep vein thrombosis prophylaxis postoperatively:regimen to be chosen by surgical team. 6. Surveillance for postoperative MI with ECG immediately postoperatively and on postoperative days 1 and 2 AND troponin levels 24 hours postoperatively and on day 4 or hospital discharge (whichever comes first): not indicated. 7. Other measures: None  Patient is optimized for surgery.  Brinden Kincheloe M. Chinyere Galiano, M.D.

## 2011-09-10 NOTE — Patient Instructions (Signed)
It was nice to see you today!  You are optimized for surgery. Your EKG is within normal limits. I hope everything goes well, and I am sure it will.  If you need anything, please let me know!   Take care! Jearline Hirschhorn M. Ahniya Mitchum, M.D.

## 2011-09-10 NOTE — Assessment & Plan Note (Signed)
  1. Preoperative workup as follows: ECG. (within normal limits) 2. Change in medication regimen before surgery: none, continue medication regimen including morning of surgery, with sip of water. 3. Prophylaxis for cardiac events with perioperative beta-blockers: not indicated. (Patient on Carvedilol which should be continued. 4. Invasive hemodynamic monitoring perioperatively: not indicated. 5. Deep vein thrombosis prophylaxis postoperatively:regimen to be chosen by surgical team. 6. Surveillance for postoperative MI with ECG immediately postoperatively and on postoperative days 1 and 2 AND troponin levels 24 hours postoperatively and on day 4 or hospital discharge (whichever comes first): not indicated. 7. Other measures: None  Patient is optimized for surgery.

## 2011-09-10 NOTE — Assessment & Plan Note (Signed)
Having left knee arthroscopic surgery with Dr. Luiz Blare on 09/12/11 for meniscus tear.

## 2011-09-14 ENCOUNTER — Other Ambulatory Visit: Payer: Self-pay | Admitting: Family Medicine

## 2011-09-15 NOTE — Telephone Encounter (Signed)
Refill request

## 2011-09-26 ENCOUNTER — Other Ambulatory Visit: Payer: Self-pay | Admitting: Family Medicine

## 2011-09-27 NOTE — Telephone Encounter (Signed)
Refill request

## 2011-09-30 ENCOUNTER — Other Ambulatory Visit: Payer: Self-pay | Admitting: Family Medicine

## 2011-09-30 NOTE — Telephone Encounter (Signed)
Refill request

## 2011-11-01 ENCOUNTER — Telehealth: Payer: Self-pay | Admitting: *Deleted

## 2011-11-01 NOTE — Telephone Encounter (Signed)
PA required for Aciphex. Form placed in MD box

## 2011-11-01 NOTE — Telephone Encounter (Signed)
PA completed and placed in "To be faxed" box.  Thank you! Aoife Bold M. Connee Ikner, M.D.

## 2011-11-02 NOTE — Telephone Encounter (Signed)
Approval received and pharmacy notified. 

## 2012-01-04 ENCOUNTER — Encounter: Payer: Self-pay | Admitting: Family Medicine

## 2012-01-04 ENCOUNTER — Ambulatory Visit (INDEPENDENT_AMBULATORY_CARE_PROVIDER_SITE_OTHER): Payer: Medicaid Other | Admitting: Family Medicine

## 2012-01-04 VITALS — BP 170/100 | HR 81 | Temp 98.5°F | Ht 61.0 in | Wt 195.4 lb

## 2012-01-04 DIAGNOSIS — N390 Urinary tract infection, site not specified: Secondary | ICD-10-CM

## 2012-01-04 DIAGNOSIS — Z202 Contact with and (suspected) exposure to infections with a predominantly sexual mode of transmission: Secondary | ICD-10-CM

## 2012-01-04 DIAGNOSIS — N898 Other specified noninflammatory disorders of vagina: Secondary | ICD-10-CM

## 2012-01-04 DIAGNOSIS — Z2089 Contact with and (suspected) exposure to other communicable diseases: Secondary | ICD-10-CM

## 2012-01-04 DIAGNOSIS — Z20828 Contact with and (suspected) exposure to other viral communicable diseases: Secondary | ICD-10-CM

## 2012-01-04 DIAGNOSIS — R300 Dysuria: Secondary | ICD-10-CM

## 2012-01-04 DIAGNOSIS — I10 Essential (primary) hypertension: Secondary | ICD-10-CM

## 2012-01-04 LAB — POCT URINALYSIS DIPSTICK
Glucose, UA: NEGATIVE
Nitrite, UA: NEGATIVE
Protein, UA: NEGATIVE
Spec Grav, UA: 1.025
Urobilinogen, UA: 2

## 2012-01-04 LAB — POCT WET PREP (WET MOUNT): Clue Cells Wet Prep Whiff POC: POSITIVE

## 2012-01-04 MED ORDER — CEPHALEXIN 500 MG PO CAPS: 500.0000 mg | ORAL_CAPSULE | Freq: Four times a day (QID) | ORAL | Status: AC

## 2012-01-04 NOTE — Progress Notes (Signed)
  Subjective:    Patient ID: Megan Miller, female    DOB: 10-29-1955, 56 y.o.   MRN: [id number]  HPI Dysuria: Patient reports pain with urination. Suprapubic cramping. And increased urinary frequency. No urinary tension. No back pain. No fever. No body aches. No blood in urine. Patient states it feels like when she had a urinary tract infection the past.  Vaginal discharge: Patient reports white/clear discharge x1 week. Positive foul odor. No vaginal bleeding. Positive history of sexual intercourse one week ago-unprotected. Patient is not sure if she has had exposure to sexually transmitted infection. Patient has had 2 sexual partners during the past year.  Blood pressure: Blood pressure 170/80. This was rechecked with manual and confirmed. Patient states she is taking all pressure medication as directed. No headache. No dizziness. No syncope. No chest pain. No shortness of breath. Patient does not check blood pressure at home. But does have blood pressure cuff at home.  Smoking status reviewed.   Review of Systems As per above    Objective:   Physical Exam  Constitutional: She appears well-developed and well-nourished.  HENT:  Head: Normocephalic and atraumatic.  Cardiovascular: Normal rate, regular rhythm and normal heart sounds.   No murmur heard. Pulmonary/Chest: Effort normal and breath sounds normal. No respiratory distress. She has no wheezes. She has no rales.  Abdominal: Soft. She exhibits no distension and no mass. There is no tenderness. There is no rebound and no guarding.  Genitourinary: Vagina normal. There is no rash, tenderness, lesion or injury on the right labia. There is no rash, tenderness, lesion or injury on the left labia.    Musculoskeletal: She exhibits no edema.       No CVA tenderness.   Neurological: She is alert.  Skin: No rash noted.  Psychiatric: She has a normal mood and affect.          Assessment & Plan:

## 2012-01-04 NOTE — Patient Instructions (Addendum)
Urinary tract infection?: Your symptoms sound like you have a urinary tract infection. Take Keflex 2 x day for 7 days.  I will send your urine for culture to see what germ is causing your symptoms.   Vaginal discharge: I will either mail or call you with your results.

## 2012-01-05 LAB — RPR

## 2012-01-05 LAB — HIV ANTIBODY (ROUTINE TESTING W REFLEX): HIV: NONREACTIVE

## 2012-01-07 DIAGNOSIS — N898 Other specified noninflammatory disorders of vagina: Secondary | ICD-10-CM | POA: Insufficient documentation

## 2012-01-07 DIAGNOSIS — N390 Urinary tract infection, site not specified: Secondary | ICD-10-CM | POA: Insufficient documentation

## 2012-01-07 NOTE — Assessment & Plan Note (Addendum)
Cause of vaginal discharge unclear- no significant discharge on exam.  No cervix on exam (has h/o hysterectomy). - so unable to do dna prob/cercial swab.  Unable to do urine GC/Chlam since urine sample already given for u/a- plus pt low risk for GC/Chlam since she doesn't have a cervix.  Wet prep sent to lab.  Pt had HIV and RPR drawn.

## 2012-01-07 NOTE — Assessment & Plan Note (Signed)
bp elevated today- pt to monitor bp at home and write down in logbook and bring to next appt.  Told to schedule appt with pcp for f/up on bp in the next 1-2 weeks.

## 2012-01-07 NOTE — Assessment & Plan Note (Signed)
Will treat with keflex based on history and symptoms.  PE reassuring. U/a negative. Urine culture pending. No signs of pyelo or systemic infection.

## 2012-01-08 ENCOUNTER — Other Ambulatory Visit: Payer: Self-pay | Admitting: Family Medicine

## 2012-01-08 LAB — URINE CULTURE

## 2012-01-08 NOTE — Telephone Encounter (Signed)
Received refill request for LIsinopril 20mg  po daily. Last filled in December 2012 with 3 refills. Recently seen in clinic with elevated blood pressure; unsure if she was taking her Lisinopril at that time? Will refill now and will see patient in clinic within the next month for a repeat blood pressure check. Megan Miller, M.D.

## 2012-02-01 ENCOUNTER — Ambulatory Visit: Payer: Medicaid Other | Admitting: Family Medicine

## 2012-02-06 ENCOUNTER — Encounter: Payer: Self-pay | Admitting: Family Medicine

## 2012-02-06 ENCOUNTER — Ambulatory Visit (INDEPENDENT_AMBULATORY_CARE_PROVIDER_SITE_OTHER): Payer: Medicaid Other | Admitting: Family Medicine

## 2012-02-06 VITALS — BP 139/81 | HR 81 | Temp 98.9°F | Ht 61.0 in | Wt 190.0 lb

## 2012-02-06 DIAGNOSIS — I10 Essential (primary) hypertension: Secondary | ICD-10-CM

## 2012-02-06 MED ORDER — LISINOPRIL-HYDROCHLOROTHIAZIDE 20-12.5 MG PO TABS: 1.0000 | ORAL_TABLET | Freq: Every day | ORAL | Status: DC

## 2012-02-06 NOTE — Progress Notes (Signed)
Subjective:     Patient ID: Megan Miller, female   DOB: 1956/04/22, 56 y.o.   MRN: [id number]  HPI  Pt is a 56 yo F with HTN presenting for elevated BP. Patient's BP 139/81 at today's visit, but she states it has been 170's diastolic at home on her home meter. She has not written these numbers down but she knows it has been elevated. At previous office visits it was elevated at well. She is on Coreg 25mg  and Lisinopril 20mg  daily, and states she takes this every day. She denies HA, chest pain, leg swelling. She has had some blurred vision but she does not feel like it is related to elevated BP. She had a cortisone shot a few months ago and feels like her BP has been higher since then. She states her diet is "ok." She does endorse eating fried food but she states she never adds salt to her food after it is prepared. She does also endorse stress from her neighbors. She has no stress relief other, and she states she does not do a good job "avoiding stress."   History reviewed: Non-smoker  Review of Systems See HPI above    Objective:   Physical Exam  Constitutional: She appears well-developed and well-nourished. No distress.  HENT:  Head: Normocephalic and atraumatic.  Eyes: Pupils are equal, round, and reactive to light.  Cardiovascular: Normal rate, regular rhythm and normal heart sounds.   No murmur heard. Pulmonary/Chest: Effort normal and breath sounds normal. She has no wheezes.  Abdominal: Soft. There is no tenderness.  Musculoskeletal: She exhibits no edema.       Assessment:     56 yo F with history of HTN, presenting for elevate BP    Plan:

## 2012-02-06 NOTE — Patient Instructions (Signed)
Your blood pressure was 139/81 today. Continue to check your blood pressure at home, and bring in these numbers for [place] to see.  I have changed your Lisinopril to Lisinopril-HCTZ. This is your same medication with a fluid pill added. Please pick this up at the pharmacy when you need your next refill.  Also, try to reduce the amount of stress in your life. I will see you back in 1-2 months for a repeat blood pressure check. Let me know if you need anything.  Tristin Gladman M. Ronal Maybury, M.D.

## 2012-02-06 NOTE — Assessment & Plan Note (Signed)
Blood pressure elevated at previous visits and at home. Will change Lisinopril to Lisinopril-HCTZ today. She will pick this up at next refill. She was also encouraged to find a way to deal with her stress. She will keep track of her BP readings at home and at pharmacy for [place] to see those. If she continues to have elevated BP with this change, we can consider adding Amlodipine and/or sending for ambulatory BP monitoring.

## 2012-02-08 ENCOUNTER — Other Ambulatory Visit: Payer: Self-pay | Admitting: Family Medicine

## 2012-02-08 DIAGNOSIS — Z1231 Encounter for screening mammogram for malignant neoplasm of breast: Secondary | ICD-10-CM

## 2012-03-04 ENCOUNTER — Other Ambulatory Visit: Payer: Self-pay | Admitting: Family Medicine

## 2012-03-14 ENCOUNTER — Other Ambulatory Visit: Payer: Self-pay | Admitting: Family Medicine

## 2012-03-21 ENCOUNTER — Ambulatory Visit (HOSPITAL_COMMUNITY)
Admission: RE | Admit: 2012-03-21 | Discharge: 2012-03-21 | Disposition: A | Discharge status: Home / Self Care | Payer: Medicaid Other | Source: Ambulatory Visit | Attending: Family Medicine | Admitting: Family Medicine

## 2012-03-21 DIAGNOSIS — Z1231 Encounter for screening mammogram for malignant neoplasm of breast: Secondary | ICD-10-CM | POA: Insufficient documentation

## 2012-04-17 ENCOUNTER — Other Ambulatory Visit: Payer: Self-pay | Admitting: Family Medicine

## 2012-06-16 ENCOUNTER — Telehealth: Payer: Self-pay | Admitting: Family Medicine

## 2012-06-16 MED ORDER — PANTOPRAZOLE SODIUM 20 MG PO TBEC: 20.0000 mg | DELAYED_RELEASE_TABLET | Freq: Every day | ORAL | Status: DC

## 2012-06-16 NOTE — Telephone Encounter (Signed)
Generic Protonix (pantoprazole) sent to pharmacy. This is in the same class as Aciphex, and it is a Medicaid preferred medication, so hopefully she will not have any problems. She should take it once per day.  Please let her know, thanks! Rayman Petrosian M. Calani Gick, M.D.

## 2012-06-16 NOTE — Telephone Encounter (Signed)
Pt is asking for something in place of the aciphex ([facility]) will not cover it is going to cost her $100 to fill. Please call something else [facility].Laureen Ochs, Viann Shove

## 2012-06-16 NOTE — Telephone Encounter (Signed)
Called and informed pt that protonix has been sent.Loralee Pacas [place]

## 2012-06-19 ENCOUNTER — Ambulatory Visit (INDEPENDENT_AMBULATORY_CARE_PROVIDER_SITE_OTHER): Payer: Medicaid Other | Admitting: Family Medicine

## 2012-06-19 ENCOUNTER — Telehealth: Payer: Self-pay | Admitting: Family Medicine

## 2012-06-19 VITALS — BP 100/60 | HR 85 | Ht 61.0 in | Wt 188.0 lb

## 2012-06-19 DIAGNOSIS — R358 Other polyuria: Secondary | ICD-10-CM

## 2012-06-19 DIAGNOSIS — R300 Dysuria: Secondary | ICD-10-CM

## 2012-06-19 DIAGNOSIS — Z23 Encounter for immunization: Secondary | ICD-10-CM

## 2012-06-19 LAB — POCT URINALYSIS DIPSTICK
Bilirubin, UA: NEGATIVE
Ketones, UA: NEGATIVE
Nitrite, UA: NEGATIVE
pH, UA: 5.5

## 2012-06-19 LAB — POCT UA - MICROSCOPIC ONLY

## 2012-06-19 NOTE — Progress Notes (Signed)
  Subjective:    Patient ID: Megan Miller, female    DOB: 1955/10/08, 56 y.o.   MRN: [id number]  HPI Polyuria - 1 week duration, associated with pain/burning with urination, no fever, no nausea or vomiting, positive for pain on lower middle back, negative for flank pain    PMH - few episodes of uncomplicated cystitis  Review of Systems See HPI    Objective:   Physical Exam BP 100/60  Pulse 85  Ht 5\' 1"  (1.549 m)  Wt 188 lb (85.276 kg)  BMI 35.52 kg/m2 A&Ox4, NAD RRR, no mrmurs No cva tendneress No suprapubic tenderness  Urinalysis    Component Value Date/Time   COLORURINE yellow 07/05/2010 0837   APPEARANCEUR Clear 07/05/2010 0837   LABSPEC >=1.030 07/05/2010 0837   PHURINE 5.5 07/05/2010 0837   GLUCOSEU NEGATIVE 01/24/2009 1413   HGBUR negative 07/05/2010 0837   HGBUR NEGATIVE 01/24/2009 1413   BILIRUBINUR NEG 06/19/2012 1435   BILIRUBINUR negative 07/05/2010 0837   KETONESUR NEGATIVE 01/24/2009 1413   PROTEINUR NEGATIVE 01/24/2009 1413   UROBILINOGEN 0.2 06/19/2012 1435   UROBILINOGEN 0.2 07/05/2010 0837   NITRITE NEG 06/19/2012 1435   NITRITE negative 07/05/2010 0837   LEUKOCYTESUR Trace 06/19/2012 1435        Assessment & Plan:  56 year old F with dysuria of undetermined etiology.

## 2012-06-19 NOTE — Patient Instructions (Signed)
Dear Megan Miller,   Thank you for coming to clinic today. Please read below regarding the issues that we discussed.   Urinary Frequency - I am not sure that cause of this. According to your urine, you do not have an infection. I will also check a blood sugar. If the problem persists, then please return.   Please follow up in clinic with. Dr. Mikel Cella. Please call earlier if you have any questions or concerns.   Sincerely,   Dr. Clinton Sawyer

## 2012-06-19 NOTE — Telephone Encounter (Signed)
Spoke with patient . Having urinary frequency and pressure for one week. Appoinmtment scheduled today for work in  .     Will send message to Dr. Mikel Cella about the protonix.   Having difficulty swallowing.

## 2012-06-19 NOTE — Telephone Encounter (Signed)
(  cont. From prior note) If she wants a different type of medication, she should ask today.  I am sorry she isn't feeling well.  Thanks! Enrrique Mierzwa M. Arianna Haydon, M.D.

## 2012-06-19 NOTE — Telephone Encounter (Signed)
I am glad she has work in appointment today. She can discuss the Protonix with the doctor today as well. That is the only PPI covered by Medicaid. If she wants a different type of medication

## 2012-06-19 NOTE — Telephone Encounter (Signed)
Thinks that the pills that she got on Monday is sticking in her throat - wants to talk to nurse  Also thinks that she has a urinary problem

## 2012-06-24 DIAGNOSIS — R300 Dysuria: Secondary | ICD-10-CM | POA: Insufficient documentation

## 2012-06-24 NOTE — Assessment & Plan Note (Signed)
Patient without evidence of UTI physical exam and urinalysis. Therefore, not antibiotics given. Encouraged to drink more H2O as she appears volume depleted. Given reason to follow up for re-evaluation.

## 2012-07-02 ENCOUNTER — Telehealth: Payer: Self-pay | Admitting: Family Medicine

## 2012-07-02 NOTE — Telephone Encounter (Signed)
Patient is calling because she can't remember who the eye doctor is that she was referred to.

## 2012-07-02 NOTE — Telephone Encounter (Signed)
Pt advised that we do not show a referral.  She states that she "just needs her eyes checked".  Told her that most office will not accept medicaid unless there is a problem, but that she can call around and if she finds one that will work with medicaid to have them call us and we will give them the referral.  Pt agreeable and very appreciative.  Keah Lamba, [name]

## 2012-07-03 ENCOUNTER — Other Ambulatory Visit: Payer: Self-pay | Admitting: Family Medicine

## 2012-07-16 ENCOUNTER — Telehealth: Payer: Self-pay | Admitting: Family Medicine

## 2012-07-16 MED ORDER — RABEPRAZOLE SODIUM 20 MG PO TBEC: 20.0000 mg | DELAYED_RELEASE_TABLET | Freq: Every day | ORAL | Status: DC

## 2012-07-16 NOTE — Telephone Encounter (Signed)
[facility]. She should stop Protonix.  Please let her know I would also like for her to schedule a follow up appointment since I have not seen her since July.  Thanks! Larisha Vencill M. Dink Creps, M.D.

## 2012-07-16 NOTE — Telephone Encounter (Signed)
Will forward to Dr Hairford 

## 2012-07-16 NOTE — Telephone Encounter (Signed)
Pt is asking to change back to Acifex - works better  [facility]

## 2012-07-16 NOTE — Telephone Encounter (Signed)
Advised pt of med Rf and to sched F/U appt. Pt voiced understanding.

## 2012-07-28 ENCOUNTER — Telehealth: Payer: Self-pay | Admitting: Family Medicine

## 2012-07-28 NOTE — Telephone Encounter (Signed)
Generic of Aciphex is not helping per patient.  Want back the name brand of med prescribed instead.

## 2012-07-28 NOTE — Telephone Encounter (Signed)
Will forward to Dr Mikel Cella for review.

## 2012-07-30 MED ORDER — ACIPHEX 20 MG PO TBEC: 20.0000 mg | DELAYED_RELEASE_TABLET | Freq: Every day | ORAL | Status: DC

## 2012-07-30 NOTE — Telephone Encounter (Signed)
Will refill, but please let her know this will likely be an out of pocket expense for her. If she continues to have problems, she should make an office visit to discuss it.  Thanks, [employer]. Nyomie Ehrlich, M.D.

## 2012-07-31 NOTE — Telephone Encounter (Signed)
Informed pt.  States that she will call back and schedule an appt. Fleeger, [name]

## 2012-08-05 ENCOUNTER — Other Ambulatory Visit: Payer: Self-pay | Admitting: Family Medicine

## 2012-08-05 ENCOUNTER — Encounter: Payer: Self-pay | Admitting: Family Medicine

## 2012-08-05 ENCOUNTER — Ambulatory Visit (INDEPENDENT_AMBULATORY_CARE_PROVIDER_SITE_OTHER): Payer: Medicaid Other | Admitting: Family Medicine

## 2012-08-05 VITALS — BP 138/79 | HR 85 | Temp 97.8°F | Ht 61.0 in | Wt 188.0 lb

## 2012-08-05 DIAGNOSIS — K219 Gastro-esophageal reflux disease without esophagitis: Secondary | ICD-10-CM

## 2012-08-05 MED ORDER — ESOMEPRAZOLE MAGNESIUM 20 MG PO PACK: 20.0000 mg | PACK | Freq: Every day | ORAL | Status: DC

## 2012-08-05 NOTE — Progress Notes (Signed)
  Subjective:    Patient ID: Megan Miller, female    DOB: 1955-08-23, 57 y.o.   MRN: [id number]  HPI  Patient presents to clinic for worsening GERD symptoms. Symptoms have been going on for about one month. Worsened after eating, relieved by nothing. Describes indigestion as "choking sensation in throat", ear fullness, and difficulty swallowing solids. Was taking brand name Aciphex, which relieved symptoms, but was $100. Changed to generic brand, but this does not relieve indigestion. Denies any chest pain, abdominal pain, nausea or vomiting. Denies any hoarseness. Associated with coughing at nighttime.  NO SOB.   Review of Systems  Per HPI    Objective:   Physical Exam  Constitutional: She appears well-nourished. No distress.  HENT:  Mouth/Throat: Oropharynx is clear and moist. No oropharyngeal exudate.  Neck: Normal range of motion. Neck supple.  Cardiovascular: Normal rate, regular rhythm and normal heart sounds.   Pulmonary/Chest: Effort normal.  Abdominal: Soft. Bowel sounds are normal. She exhibits no distension. There is no tenderness.       Assessment & Plan:

## 2012-08-05 NOTE — Assessment & Plan Note (Signed)
Patient says generic Aciphex has not been relieving indigestion, wants to go back to brand name.  She will need prior authorization per PCP - discussed this with patient.  Will start trial of Nexium to see if this helps.  If not, then she would have failed 2 treatments and hopefully can receive brand name medication.  Red flags reviewed.  Handout given.  RTC in one week.

## 2012-08-05 NOTE — Patient Instructions (Addendum)
It was nice to meet you today, Brunilda. Please pick up Nexium and take 1 tablet daily x 4 weeks. If this does not help, your PCP can try to authorize brand name ACIPHEX. Keep your appointment with her next week. If you develop worsening pain, difficulty swallowing, or choking sensation, please return to clinic or go to ER.  Diet for Gastroesophageal Reflux Disease, Adult Reflux (acid reflux) is when acid from your stomach flows up into the esophagus. When acid comes in contact with the esophagus, the acid causes irritation and soreness (inflammation) in the esophagus. When reflux happens often or so severely that it causes damage to the esophagus, it is called gastroesophageal reflux disease (GERD). Nutrition therapy can help ease the discomfort of GERD. FOODS OR DRINKS TO AVOID OR LIMIT  Smoking or chewing tobacco. Nicotine is one of the most potent stimulants to acid production in the gastrointestinal tract.  Caffeinated and decaffeinated coffee and black tea.  Regular or low-calorie carbonated beverages or energy drinks (caffeine-free carbonated beverages are allowed).   Strong spices, such as black pepper, white pepper, red pepper, cayenne, curry powder, and chili powder.  Peppermint or spearmint.  Chocolate.  High-fat foods, including meats and fried foods. Extra added fats including oils, butter, salad dressings, and nuts. Limit these to less than 8 tsp per day.  Fruits and vegetables if they are not tolerated, such as citrus fruits or tomatoes.  Alcohol.  Any food that seems to aggravate your condition. If you have questions regarding your diet, call your caregiver or a registered dietitian. OTHER THINGS THAT MAY HELP GERD INCLUDE:   Eating your meals slowly, in a relaxed setting.  Eating 5 to 6 small meals per day instead of 3 large meals.  Eliminating food for a period of time if it causes distress.  Not lying down until 3 hours after eating a meal.  Keeping the head  of your bed raised 6 to 9 inches (15 to 23 cm) by using a foam wedge or blocks under the legs of the bed. Lying flat may make symptoms worse.  Being physically active. Weight loss may be helpful in reducing reflux in overweight or obese adults.  Wear loose fitting clothing EXAMPLE MEAL PLAN This meal plan is approximately 2,000 calories based on https://www.bernard.org/ meal planning guidelines. Breakfast   cup cooked oatmeal.  1 cup strawberries.  1 cup low-fat milk.  1 oz almonds. Snack  1 cup cucumber slices.  6 oz yogurt (made from low-fat or fat-free milk). Lunch  2 slice whole-wheat bread.  2 oz sliced [place].  2 tsp mayonnaise.  1 cup blueberries.  1 cup snap peas. Snack  6 whole-wheat crackers.  1 oz string cheese. Dinner   cup brown rice.  1 cup mixed veggies.  1 tsp olive oil.  3 oz grilled fish. Document Released: 07/16/2005 Document Revised: 10/08/2011 Document Reviewed: 06/01/2011 [facility] Patient Information 2013 [place], [place].

## 2012-08-11 ENCOUNTER — Ambulatory Visit: Payer: Medicaid Other | Admitting: Family Medicine

## 2012-08-11 ENCOUNTER — Telehealth: Payer: Self-pay | Admitting: *Deleted

## 2012-08-11 ENCOUNTER — Other Ambulatory Visit: Payer: Self-pay | Admitting: Family Medicine

## 2012-08-11 MED ORDER — LANSOPRAZOLE 30 MG PO CPDR: 30.0000 mg | DELAYED_RELEASE_CAPSULE | Freq: Every day | ORAL | Status: DC

## 2012-08-11 NOTE — Telephone Encounter (Signed)
PA required for Nexium. Form given to Dr. Tye Savoy

## 2012-08-11 NOTE — Telephone Encounter (Signed)
Pt states that she will try prevacid.  To MD for refill Megan Miller, Megan Miller

## 2012-08-11 NOTE — Telephone Encounter (Signed)
Prevacid sent to pharmacy. Thanks! Amber M. Hairford, M.D.

## 2012-08-11 NOTE — Telephone Encounter (Signed)
Spoke with patient.  She will look up which med she was on previously and giver [place] a call back.  Pt must choose between Prevacid, protonix or prilosec.  She sates that she will call back about which one she chooses. Matti Killingsworth, [name]

## 2012-08-11 NOTE — Telephone Encounter (Signed)
[facility] is calling because [facility] [address] [facility] not [facility] for the refill on Nexium.  Patient would appreciate a call when this has been completed.

## 2012-08-11 NOTE — Telephone Encounter (Signed)
Completed PA - gave form to [place].

## 2012-08-11 NOTE — Telephone Encounter (Signed)
Patient has been on Protonix in the past which is approved by Medicaid. She will need to fail treatment with preferred medications before PA will be approved.  Thanks.

## 2012-08-11 NOTE — Telephone Encounter (Signed)
Spoke with the pharmacy.  They state that the nexium required PA, so we sent in the Aciphex.  Called pt to inform but she states that does not work for her.  Will forward to MD. Milas Gain, [name]

## 2012-08-12 NOTE — Telephone Encounter (Signed)
According to her chart, patient was prescribed Prevacid on 08/11/12 per PCP.  Hopefully, this is on the Medicaid preferred list and will not need the PA form anymore.  Thanks, Larita Fife.

## 2012-08-12 NOTE — Telephone Encounter (Signed)
Attempted to do PA online however doctor does not have listed 2 preferred drugs that were tried and failed. Aciphex  is a non preferred and calcium carbionate is OTC. Will send to MD to advise.

## 2012-08-12 NOTE — Addendum Note (Signed)
Addended by: Tye Savoy, IVY on: 08/12/2012 12:46 PM   Modules accepted: Orders

## 2012-08-13 ENCOUNTER — Other Ambulatory Visit: Payer: Self-pay | Admitting: Family Medicine

## 2012-08-13 MED ORDER — LANSOPRAZOLE 15 MG PO CPDR: 30.0000 mg | DELAYED_RELEASE_CAPSULE | Freq: Every day | ORAL | Status: DC

## 2012-08-13 NOTE — Telephone Encounter (Addendum)
I called pharmacy and was told that prevacid required PA. She did try to send thru by generic also. This is on the preferred list. Called medicaid  and was told that prevacid  15 mg is the preferred. Prevacid 30 mg will require PA. Omeprazole 20 and 40 are on preferred list. Will forward to MD.

## 2012-08-13 NOTE — Telephone Encounter (Signed)
Pt is asking when this can be called in.  pls advise

## 2012-08-13 NOTE — Telephone Encounter (Signed)
[facility] Prevacid OTC is a preferred level. Pharmacist is able to change this from Rx to OTC for Ms. Hartung.  She will take Prevacid 15mg  Two tabs daily, which is the equivalent. I have confirmed that this is covered by Medicaid, and it will be available to pick up soon. Please let patient know.  Thanks! Ihsan Nomura M. Carianna Lague, M.D.

## 2012-08-13 NOTE — Telephone Encounter (Signed)
Patient notified

## 2012-08-21 ENCOUNTER — Ambulatory Visit: Payer: Medicaid Other | Admitting: Family Medicine

## 2012-08-28 ENCOUNTER — Telehealth: Payer: Self-pay | Admitting: Family Medicine

## 2012-08-28 ENCOUNTER — Ambulatory Visit (INDEPENDENT_AMBULATORY_CARE_PROVIDER_SITE_OTHER): Payer: Medicaid Other | Admitting: Family Medicine

## 2012-08-28 ENCOUNTER — Encounter: Payer: Self-pay | Admitting: Family Medicine

## 2012-08-28 VITALS — BP 129/78 | HR 85 | Temp 98.4°F | Ht 61.0 in | Wt 186.0 lb

## 2012-08-28 DIAGNOSIS — R300 Dysuria: Secondary | ICD-10-CM

## 2012-08-28 DIAGNOSIS — K219 Gastro-esophageal reflux disease without esophagitis: Secondary | ICD-10-CM

## 2012-08-28 DIAGNOSIS — I10 Essential (primary) hypertension: Secondary | ICD-10-CM

## 2012-08-28 DIAGNOSIS — R21 Rash and other nonspecific skin eruption: Secondary | ICD-10-CM

## 2012-08-28 DIAGNOSIS — M25562 Pain in left knee: Secondary | ICD-10-CM

## 2012-08-28 DIAGNOSIS — M25569 Pain in unspecified knee: Secondary | ICD-10-CM

## 2012-08-28 DIAGNOSIS — Z79899 Other long term (current) drug therapy: Secondary | ICD-10-CM

## 2012-08-28 LAB — CBC
MCH: 27.8 pg (ref 26.0–34.0)
Platelets: 301 10*3/uL (ref 150–400)
RBC: 4.21 MIL/uL (ref 3.87–5.11)
RDW: 13.9 % (ref 11.5–15.5)
WBC: 6.4 10*3/uL (ref 4.0–10.5)

## 2012-08-28 LAB — LIPID PANEL
HDL: 49 mg/dL (ref >39)
Triglycerides: 90 mg/dL (ref <150)

## 2012-08-28 LAB — POCT URINALYSIS DIPSTICK
Bilirubin, UA: NEGATIVE
Blood, UA: NEGATIVE
Ketones, UA: NEGATIVE
pH, UA: 6.5

## 2012-08-28 LAB — COMPREHENSIVE METABOLIC PANEL
ALT: 12 U/L (ref 0–35)
AST: 17 U/L (ref 0–37)
CO2: 31 mEq/L (ref 19–32)
Chloride: 101 mEq/L (ref 96–112)
Creat: 1.15 mg/dL — ABNORMAL HIGH (ref 0.50–1.10)
Sodium: 139 mEq/L (ref 135–145)
Total Bilirubin: 0.5 mg/dL (ref 0.3–1.2)
Total Protein: 7.4 g/dL (ref 6.0–8.3)

## 2012-08-28 MED ORDER — TRIAMCINOLONE ACETONIDE 0.1 % EX CREA: TOPICAL_CREAM | Freq: Two times a day (BID) | CUTANEOUS | Status: DC

## 2012-08-28 NOTE — Assessment & Plan Note (Signed)
BP at goal on Coreg, Lisinopril/HCTZ. Will check CBC and Cmet today. RTC in 3 months for follow up

## 2012-08-28 NOTE — Telephone Encounter (Signed)
Pt updated on the results of her negative UA. Will follow up with rest of her results tomorrow. Pt agrees.  Megan Miller M. [name], M.D. 08/28/2012 11:54 AM

## 2012-08-28 NOTE — Progress Notes (Signed)
Patient ID: Megan Miller, female   DOB: August 08, 1955, 57 y.o.   MRN: [id number]  [facility] Nickholas Goldston M. Tajon Moring, MD Phone: [phone]   Subjective: HPI: Patient is a 57 y.o. female presenting to clinic today for follow up appointment. Concerns today include GERD, face itches, Knee pain, urinary frequency.  1. GERD- Tried Prilosec, Protonix and Prevacid, which are Medicaid preferred. Aciphex is the only medication that she states really works for her. She describes now that she feels like she is "choking" like something is stuck in her throat. No abdominal pain, no N/V, no medication side effect.  2. Urinary frequency- Patient reports frequency, but no dysuria, no blood in urine. No flank pain or abdominal pain. Reports a UTI last year. No new sexual contacts. No pain with intercourse. No fevers, no chills. No rashes  3. HTN- BP at goal today. Taking medications with no problems. Denies HA, changes in vision, CP, abd pain. Overall feels well.  4. Face itching- Has been using oil of olay cream. No rash, no redness that she has noticed. Been going on for one month. She has been told in the past that she has try skin.  5. Knee pain- Muscle cramps, arthritis. She has been getting Coritsone shots every 6 months at Dr. Luiz Blare office. Also takes IBU which helps a little but pain returns. Unsure when her next appointment is with Dr. Luiz Blare.   History Reviewed: Non smoker. Health Maintenance: UTD on flu shot. Last mammogram in May 2013.  ROS: Please see HPI above.  Objective: Office vital signs reviewed. There were no vitals taken for this visit.  Physical Examination:  General: Awake, alert. NAD. HEENT: Atraumatic, normocephalic. Posterior pharynx clear. Non-pustular, erythematous rash on cheeks near bridge of nose. Glasses also seem to be rubbing that area as well.  Neck: No masses palpated. No LAD. No bruit Pulm: CTAB, no wheezes Cardio: RRR, no murmurs  appreciated Abdomen: Obese, +BS, soft, nontender, nondistended Extremities: No edema. TTP left knee. Limited ROM. Able to ambulate well. Neuro: Grossly intact  Assessment: 57 y.o. female Follow up appointment  Plan: See Problem List and After Visit Summary

## 2012-08-28 NOTE — Assessment & Plan Note (Signed)
She reports more frequency than pain today. No red flags. Will check UA and let her know the results. She has had negative UA in the past. She should limit her fluid intake late at night to reduce the amount she is urinating.

## 2012-08-28 NOTE — Patient Instructions (Signed)
It was good to see you!  I have sent in a refill for the cream to use on your face. Only use a very small amount and use moisturizer as well. Please make an appointment to see Dr. Luiz Blare for your knee.  Your blood pressure looked GREAT today!!  Also, keep taking the stomach medicine for 3 months. Come back and see me then and we will work on getting your Aciphex approved.  Call if you need anything.  Amber M. Hairford, M.D.

## 2012-08-28 NOTE — Assessment & Plan Note (Signed)
Pt has tried all of Medicaid approved medications and still continues to have symptoms. Will continue to use Prevacid for now, but if her symptoms have not resolved with 6 months of approved treatment will complete PA for Aciphex.

## 2012-08-28 NOTE — Assessment & Plan Note (Signed)
Rash appears to be eczematous on her cheeks. Not likely rosacea since she has not had this for a long period of time. Refilled Kenalog ointment and she should moisturize well. RTC for follow up, but if rash/itching worsens she should return sooner than that. Pt agrees.

## 2012-08-28 NOTE — Assessment & Plan Note (Signed)
Continue Naproxen and follow up with Dr. Luiz Blare. She has asked for a brace, which she can buy OTC if she would like but it was not prescribed today.

## 2012-09-08 ENCOUNTER — Telehealth: Payer: Self-pay | Admitting: Family Medicine

## 2012-09-08 NOTE — Telephone Encounter (Signed)
Pt advised that Medicaid does not pay for eye exams so she can go anywhere and see an optometrist-pt voiced understanding.

## 2012-09-08 NOTE — Telephone Encounter (Signed)
Pt wants to go get her eye checked and they told her that she would need to be referred - pls advise

## 2012-09-22 ENCOUNTER — Telehealth: Payer: Self-pay | Admitting: Family Medicine

## 2012-09-22 NOTE — Telephone Encounter (Signed)
Patient dropped off form to be filled out for [employer].  Please call her when completed.

## 2012-09-24 NOTE — Telephone Encounter (Signed)
I have reviewed this form and at this time I do not feel like I am the appropriate person to verify what she needs. The form needs a signature from an agency to verify Ms. Hetz's financial status. She has not completed this part of her form and I do not have experience to do this.  Nelva Bush, do you think you could help me with this please? The form is still in my mailbox at the front office.  Thank you! Gearld Kerstein M. Seyed Heffley, M.D.

## 2012-09-25 NOTE — Telephone Encounter (Signed)
I will take a look at it today when i get to the clinic and see what needs to be done with it.  Thanks, [employer]

## 2012-10-01 ENCOUNTER — Telehealth: Payer: Self-pay | Admitting: Clinical

## 2012-10-01 NOTE — Telephone Encounter (Signed)
Clinical Child psychotherapist (CSW) received a referral to contact pt regarding a "[employer]" application.   CSW contacted "[employer] [phone]" who stated that CSW would be an acceptable representative. CSW reviewed the application and pt will need to have proof of income as well as a most recent eye exam. CSW left a message for pt.   [name], MSW, Theresia Majors [phone]

## 2012-10-02 ENCOUNTER — Telehealth: Payer: Self-pay | Admitting: Clinical

## 2012-10-02 NOTE — Telephone Encounter (Signed)
Clinical Child psychotherapist (CSW) spoke with pt and informed her of the need to fully complete the application and provide proof of income. Pt appreciative of call. Pt will redo the application and take it to the clinic for CSW to sign and submit.  [name], MSW, Theresia Majors [phone]

## 2012-10-06 ENCOUNTER — Encounter: Payer: Self-pay | Admitting: Clinical

## 2012-10-06 NOTE — Progress Notes (Signed)
CSW met with pt to complete the [employer] application. Pt provided necessary proof of income and completed the application in full. Pt will mail application once she receives an eye exam. Pt was very appreciative of CSW assistance. CSW will contact pt when voucher arrives to the clinic.   [name], MSW, Theresia Majors [phone]

## 2012-10-17 ENCOUNTER — Other Ambulatory Visit: Payer: Self-pay | Admitting: Family Medicine

## 2012-10-20 ENCOUNTER — Other Ambulatory Visit: Payer: Self-pay | Admitting: Family Medicine

## 2012-11-25 ENCOUNTER — Telehealth: Payer: Self-pay | Admitting: Family Medicine

## 2012-11-25 MED ORDER — RABEPRAZOLE SODIUM 20 MG PO TBEC: 20.0000 mg | DELAYED_RELEASE_TABLET | Freq: Every day | ORAL | Status: DC

## 2012-11-25 NOTE — Telephone Encounter (Signed)
Megan Miller has been taking new med prescribed for her reflux for the 23m period suggested.  Want to go back to the ACIPHEX instead.  Please send refill to pharmacy and let her know when done.

## 2012-11-25 NOTE — Telephone Encounter (Signed)
New Rx sent. Will likely get PA from pharmacy that will need to be completed for Medicaid.  Ezrael Sam M. Renezmae Canlas, M.D. 11/25/2012 12:05 PM

## 2012-11-25 NOTE — Telephone Encounter (Addendum)
[facility].  Form given to Dr. Mikel Cella for completion.  Afterwards, will submit online to [facility] Tracks for approval.  Gaylene Brooks, RN

## 2012-11-27 NOTE — Telephone Encounter (Signed)
Received approval for Aciphex from  Tracks---good through 11/27/13.  [facility] to [phone].  Gaylene Brooks, RN

## 2012-12-22 ENCOUNTER — Other Ambulatory Visit: Payer: Self-pay | Admitting: Family Medicine

## 2013-01-19 ENCOUNTER — Other Ambulatory Visit: Payer: Self-pay | Admitting: Family Medicine

## 2013-01-19 DIAGNOSIS — Z1231 Encounter for screening mammogram for malignant neoplasm of breast: Secondary | ICD-10-CM

## 2013-01-31 ENCOUNTER — Other Ambulatory Visit: Payer: Self-pay | Admitting: Family Medicine

## 2013-03-18 ENCOUNTER — Ambulatory Visit (INDEPENDENT_AMBULATORY_CARE_PROVIDER_SITE_OTHER): Payer: Medicaid Other | Admitting: Family Medicine

## 2013-03-18 ENCOUNTER — Encounter: Payer: Self-pay | Admitting: Family Medicine

## 2013-03-18 VITALS — BP 111/75 | HR 91 | Temp 98.2°F | Wt 182.0 lb

## 2013-03-18 DIAGNOSIS — Z23 Encounter for immunization: Secondary | ICD-10-CM

## 2013-03-18 DIAGNOSIS — M25511 Pain in right shoulder: Secondary | ICD-10-CM | POA: Insufficient documentation

## 2013-03-18 DIAGNOSIS — M25519 Pain in unspecified shoulder: Secondary | ICD-10-CM

## 2013-03-18 DIAGNOSIS — F3289 Other specified depressive episodes: Secondary | ICD-10-CM

## 2013-03-18 DIAGNOSIS — I10 Essential (primary) hypertension: Secondary | ICD-10-CM

## 2013-03-18 DIAGNOSIS — F329 Major depressive disorder, single episode, unspecified: Secondary | ICD-10-CM | POA: Insufficient documentation

## 2013-03-18 DIAGNOSIS — F32A Depression, unspecified: Secondary | ICD-10-CM

## 2013-03-18 DIAGNOSIS — K219 Gastro-esophageal reflux disease without esophagitis: Secondary | ICD-10-CM

## 2013-03-18 MED ORDER — RABEPRAZOLE SODIUM 20 MG PO TBEC: 20.0000 mg | DELAYED_RELEASE_TABLET | Freq: Every day | ORAL | Status: DC

## 2013-03-18 NOTE — Assessment & Plan Note (Signed)
BP at goal. Continue Lisinopril-HCTZ. Recheck labs at next visit.

## 2013-03-18 NOTE — Patient Instructions (Signed)
It was good to see you today.  1. Please call the [facility] clinic at [phone] to discuss your mood. I think this would be very helpful for you. They do accept Medicaid. 2. For your arm, do the exercises below and take Tylenol up to three times per day. I will see you back in 1-2 months to see how it is feeling. 3. I sent in the Aciphex name brand to the pharmacy, hopefully that will work.  I will see you soon, please let me know if you need anything!  Abygail Galeno M. Isauro Skelley, M.D.  Shoulder Exercises EXERCISES  RANGE OF MOTION (ROM) AND STRETCHING EXERCISES These exercises may help you when beginning to rehabilitate your injury. Your symptoms may resolve with or without further involvement from your physician, physical therapist or athletic trainer. While completing these exercises, remember:   Restoring tissue flexibility helps normal motion to return to the joints. This allows healthier, less painful movement and activity.  An effective stretch should be held for at least 30 seconds.  A stretch should never be painful. You should only feel a gentle lengthening or release in the stretched tissue. ROM - Pendulum  Bend at the waist so that your right / left arm falls away from your body. Support yourself with your opposite hand on a solid surface, such as a table or a countertop.  Your right / left arm should be perpendicular to the ground. If it is not perpendicular, you need to lean over farther. Relax the muscles in your right / left arm and shoulder as much as possible.  Gently sway your hips and trunk so they move your right / left arm without any use of your right / left shoulder muscles.  Progress your movements so that your right / left arm moves side to side, then forward and backward, and finally, both clockwise and counterclockwise.  Complete __________ repetitions in each direction. Many people use this exercise to relieve discomfort in their shoulder as well as to gain range of  motion. Repeat __________ times. Complete this exercise __________ times per day. STRETCH  Flexion, Standing  Stand with good posture. With an underhand grip on your right / left hand and an overhand grip on the opposite hand, grasp a broomstick or cane so that your hands are a little more than shoulder-width apart.  Keeping your right / left elbow straight and shoulder muscles relaxed, push the stick with your opposite hand to raise your right / left arm in front of your body and then overhead. Raise your arm until you feel a stretch in your right / left shoulder, but before you have increased shoulder pain.  Try to avoid shrugging your right / left shoulder as your arm rises by keeping your shoulder blade tucked down and toward your mid-back spine. Hold __________ seconds.  Slowly return to the starting position. Repeat __________ times. Complete this exercise __________ times per day. STRETCH - Internal Rotation  Place your right / left hand behind your back, palm-up.  Throw a towel or belt over your opposite shoulder. Grasp the towel/belt with your right / left hand.  While keeping an upright posture, gently pull up on the towel/belt until you feel a stretch in the front of your right / left shoulder.  Avoid shrugging your right / left shoulder as your arm rises by keeping your shoulder blade tucked down and toward your mid-back spine.  Hold __________. Release the stretch by lowering your opposite hand. Repeat __________ times.  Complete this exercise __________ times per day. STRETCH - External Rotation and Abduction  Stagger your stance through a doorframe. It does not matter which foot is forward.  As instructed by your physician, physical therapist or athletic trainer, place your hands:  And forearms above your head and on the door frame.  And forearms at head-height and on the door frame.  At elbow-height and on the door frame.  Keeping your head and chest upright and  your stomach muscles tight to prevent over-extending your low-back, slowly shift your weight onto your front foot until you feel a stretch across your chest and/or in the front of your shoulders.  Hold __________ seconds. Shift your weight to your back foot to release the stretch. Repeat __________ times. Complete this stretch __________ times per day.  STRENGTHENING EXERCISES  These exercises may help you when beginning to rehabilitate your injury. They may resolve your symptoms with or without further involvement from your physician, physical therapist or athletic trainer. While completing these exercises, remember:   Muscles can gain both the endurance and the strength needed for everyday activities through controlled exercises.  Complete these exercises as instructed by your physician, physical therapist or athletic trainer. Progress the resistance and repetitions only as guided.  You may experience muscle soreness or fatigue, but the pain or discomfort you are trying to eliminate should never worsen during these exercises. If this pain does worsen, stop and make certain you are following the directions exactly. If the pain is still present after adjustments, discontinue the exercise until you can discuss the trouble with your clinician.  If advised by your physician, during your recovery, avoid activity or exercises which involve actions that place your right / left hand or elbow above your head or behind your back or head. These positions stress the tissues which are trying to heal. STRENGTH - Scapular Depression and Adduction  With good posture, sit on a firm chair. Supported your arms in front of you with pillows, arm rests or a table top. Have your elbows in line with the sides of your body.  Gently draw your shoulder blades down and toward your mid-back spine. Gradually increase the tension without tensing the muscles along the top of your shoulders and the back of your neck.  Hold for  __________ seconds. Slowly release the tension and relax your muscles completely before completing the next repetition.  After you have practiced this exercise, remove the arm support and complete it in standing as well as sitting. Repeat __________ times. Complete this exercise __________ times per day.  STRENGTH - External Rotators  Secure a rubber exercise band/tubing to a fixed object so that it is at the same height as your right / left elbow when you are standing or sitting on a firm surface.  Stand or sit so that the secured exercise band/tubing is at your side that is not injured.  Bend your elbow 90 degrees. Place a folded towel or small pillow under your right / left arm so that your elbow is a few inches away from your side.  Keeping the tension on the exercise band/tubing, pull it away from your body, as if pivoting on your elbow. Be sure to keep your body steady so that the movement is only coming from your shoulder rotating.  Hold __________ seconds. Release the tension in a controlled manner as you return to the starting position. Repeat __________ times. Complete this exercise __________ times per day.  STRENGTH - Supraspinatus  Stand or sit with good posture. Grasp a __________ weight or an exercise band/tubing so that your hand is "thumbs-up," like when you shake hands.  Slowly lift your right / left hand from your thigh into the air, traveling about 30 degrees from straight out at your side. Lift your hand to shoulder height or as far as you can without increasing any shoulder pain. Initially, many people do not lift their hands above shoulder height.  Avoid shrugging your right / left shoulder as your arm rises by keeping your shoulder blade tucked down and toward your mid-back spine.  Hold for __________ seconds. Control the descent of your hand as you slowly return to your starting position. Repeat __________ times. Complete this exercise __________ times per day.    STRENGTH - Shoulder Extensors  Secure a rubber exercise band/tubing so that it is at the height of your shoulders when you are either standing or sitting on a firm arm-less chair.  With a thumbs-up grip, grasp an end of the band/tubing in each hand. Straighten your elbows and lift your hands straight in front of you at shoulder height. Step back away from the secured end of band/tubing until it becomes tense.  Squeezing your shoulder blades together, pull your hands down to the sides of your thighs. Do not allow your hands to go behind you.  Hold for __________ seconds. Slowly ease the tension on the band/tubing as you reverse the directions and return to the starting position. Repeat __________ times. Complete this exercise __________ times per day.  STRENGTH - Scapular Retractors  Secure a rubber exercise band/tubing so that it is at the height of your shoulders when you are either standing or sitting on a firm arm-less chair.  With a palm-down grip, grasp an end of the band/tubing in each hand. Straighten your elbows and lift your hands straight in front of you at shoulder height. Step back away from the secured end of band/tubing until it becomes tense.  Squeezing your shoulder blades together, draw your elbows back as you bend them. Keep your upper arm lifted away from your body throughout the exercise.  Hold __________ seconds. Slowly ease the tension on the band/tubing as you reverse the directions and return to the starting position. Repeat __________ times. Complete this exercise __________ times per day. STRENGTH  Scapular Depressors  Find a sturdy chair without wheels, such as a from a dining room table.  Keeping your feet on the floor, lift your bottom from the seat and lock your elbows.  Keeping your elbows straight, allow gravity to pull your body weight down. Your shoulders will rise toward your ears.  Raise your body against gravity by drawing your shoulder blades down  your back, shortening the distance between your shoulders and ears. Although your feet should always maintain contact with the floor, your feet should progressively support less body weight as you get stronger.  Hold __________ seconds. In a controlled and slow manner, lower your body weight to begin the next repetition. Repeat __________ times. Complete this exercise __________ times per day.  Document Released: 05/30/2005 Document Revised: 10/08/2011 Document Reviewed: 10/28/2008 [facility] Patient Information 2014 [place], [place].

## 2013-03-18 NOTE — Assessment & Plan Note (Signed)
Likely rotator cuff tendinopathy. Encouraged to take Tylenol and use ROM exercises. If not improved, will refer to [facility] for evaluation.

## 2013-03-18 NOTE — Assessment & Plan Note (Addendum)
Would like to talk to someone first. No SI/HI. Given [facility] clinic number to contact for counseling. RTC in 1-2 months

## 2013-03-18 NOTE — Progress Notes (Signed)
Patient ID: Megan Miller, female   DOB: [birth year], 57 y.o.   MRN: [id number]  [facility] [place] M. Camari Wisham, MD Phone: [phone]   Subjective: HPI: Patient is a 57 y.o. female presenting to clinic today for CPE. Concerns today include right arm pain  1. Hypertension Blood pressure at home:  Blood pressure today: 111/75 Taking Meds: Lisinopril-HCTZ Side effects: None ROS: Denies headache, visual changes, nausea, vomiting, chest pain, abdominal pain or shortness of breath.  2. GERD On generic Aciphex, states it is not working as well.  3. Health Maintenance Mammo next week  Does not need pap (s/p hysterectomy) Never had colonoscopy   4. Right arm pain- pain is worse when laying on that side. Does not interfere with her daily life. Taking Aspirin 81mg  which does not help. Does not remember injuring arm. Never had this before. Not working now.   5. Feeling down - Doesn't feel like going anywhere for last year. Family issues. Likes to go for a walk or knit/chrocet to feel better. No SI/HI. Would like to talk to someone.  History Reviewed: Non smoker.  ROS: Please see HPI above.  Objective: Office vital signs reviewed. BP 111/75  Pulse 91  Temp(Src) 98.2 F (36.8 C) (Oral)  Wt 182 lb (82.555 kg)  BMI 34.41 kg/m2  Physical Examination:  General: Awake, alert. NAD. HEENT: Atraumatic, normocephalic. MMM. Some dental caries Pulm: CTAB, no wheezes Cardio: RRR, no murmurs appreciated Abdomen:+BS, soft, nontender, nondistended Extremities: No edema, Moves all extremities  Right shoulder without TTP. Difficulty raising arm fully. + empty can, ?+hawkins Neuro: Grossly intact  Assessment: 57 y.o. female CPE  Plan: See Problem List and After Visit Summary

## 2013-03-18 NOTE — Addendum Note (Signed)
Addended by: Henri Medal on: 03/18/2013 01:54 PM   Modules accepted: Orders

## 2013-03-18 NOTE — Assessment & Plan Note (Signed)
States generic Aciphex not working. We worked extensively to get her name brand. Rx sent to pharmacy again.

## 2013-03-20 ENCOUNTER — Telehealth: Payer: Self-pay | Admitting: Family Medicine

## 2013-03-20 ENCOUNTER — Other Ambulatory Visit: Payer: Self-pay | Admitting: Family Medicine

## 2013-03-20 NOTE — Telephone Encounter (Signed)
Spoke with patient and informed her that would mail a list of dentist that accept medicaid.  She will need to call around and ask since we don't refer to dentist.  Burnard Hawthorne

## 2013-03-20 NOTE — Telephone Encounter (Signed)
Patient was in on 8/20 and forgot to ask for a Referral for a Dentist.  She doesn't have a particular dentist that she sees, she needs help finding one that takes Medicaid and then she needs to be referred there.  Please call her when this is done.

## 2013-03-24 ENCOUNTER — Ambulatory Visit (HOSPITAL_COMMUNITY)
Admission: RE | Admit: 2013-03-24 | Discharge: 2013-03-24 | Disposition: A | Discharge status: Home / Self Care | Payer: Medicaid Other | Source: Ambulatory Visit | Attending: Family Medicine | Admitting: Family Medicine

## 2013-03-24 ENCOUNTER — Other Ambulatory Visit: Payer: Self-pay | Admitting: Family Medicine

## 2013-03-24 DIAGNOSIS — Z1231 Encounter for screening mammogram for malignant neoplasm of breast: Secondary | ICD-10-CM

## 2013-04-08 ENCOUNTER — Ambulatory Visit (INDEPENDENT_AMBULATORY_CARE_PROVIDER_SITE_OTHER): Payer: Medicaid Other | Admitting: Family Medicine

## 2013-04-08 ENCOUNTER — Encounter: Payer: Self-pay | Admitting: Family Medicine

## 2013-04-08 ENCOUNTER — Telehealth: Payer: Self-pay | Admitting: Family Medicine

## 2013-04-08 VITALS — BP 123/79 | HR 77 | Temp 98.0°F | Ht 61.0 in | Wt 182.0 lb

## 2013-04-08 DIAGNOSIS — M25511 Pain in right shoulder: Secondary | ICD-10-CM

## 2013-04-08 DIAGNOSIS — M25519 Pain in unspecified shoulder: Secondary | ICD-10-CM

## 2013-04-08 DIAGNOSIS — R21 Rash and other nonspecific skin eruption: Secondary | ICD-10-CM

## 2013-04-08 MED ORDER — NAPROXEN 375 MG PO TABS: ORAL_TABLET | ORAL | Status: DC

## 2013-04-08 MED ORDER — ACIPHEX 20 MG PO TBEC: DELAYED_RELEASE_TABLET | ORAL | Status: DC

## 2013-04-08 MED ORDER — DESONIDE 0.05 % EX CREA: TOPICAL_CREAM | Freq: Two times a day (BID) | CUTANEOUS | Status: DC

## 2013-04-08 NOTE — Progress Notes (Signed)
Patient ID: Megan Miller, female   DOB: 1955-12-10, 57 y.o.   MRN: [id number]  [facility] Marca Gadsby M. Soyla Bainter, MD Phone: [phone]   Subjective: HPI: Patient is a 57 y.o. female presenting to clinic today for follow up appointment.  1. Face rash - Rash started 2 months ago. No new soaps or lotions. Has not tried anything for the rash. States it burns and itches. Never had anything like this before, however it is on her problem list from last winder. Skin is a little lighter around the rash. Nowhere else on body.   2. Shoulder pain - Right shoulder pain. Has been doing ROM exercises "almost every day" but has not gotten any better. States it has been getting worse. She states it is worse at night, throbs and interrupts her sleep. She states she able to do everything she normally does during the day. She has been taking Tylenol which does not help.   History Reviewed: Non-smoker. ROS: Please see HPI above.  Objective: Office vital signs reviewed. BP 123/79  Pulse 77  Temp(Src) 98 F (36.7 C) (Oral)  Ht 5\' 1"  (1.549 m)  Wt 182 lb (82.555 kg)  BMI 34.41 kg/m2  Physical Examination:  General: Awake, alert. NAD HEENT: Atraumatic, normocephalic. MMM. Dry, non erythematous rash on face around lips and nose. No distinct lesions or open sores. No excoriations.  Neck: No masses palpated. No LAD Pulm: CTAB, no wheezes Cardio: RRR, no murmurs appreciated Abdomen:+BS, soft, nontender, nondistended Extremities: No edema. Right shoulder ROM limited with lateral movement. Pt reports pain with Hawkins and empty can although she has very good strength. No TTP of joint. Neuro: Grossly intact  Assessment: 57 y.o. female follo wup  Plan: See Problem List and After Visit Summary

## 2013-04-08 NOTE — Telephone Encounter (Signed)
Pt called to inform Dr. Mikel Cella that [facility] carries the name brand medication they discuss and would like it sent to the [facility] on [address] JW

## 2013-04-08 NOTE — Telephone Encounter (Signed)
Will forward to MD. Vernadine Coombs,CMA  

## 2013-04-08 NOTE — Telephone Encounter (Signed)
Sent to [facility]. Hopefully that will work.  Tarun Patchell M. Chelby Salata, M.D.

## 2013-04-08 NOTE — Patient Instructions (Addendum)
For your shoulder, keep moving it! Take Naproxen as needed for pain. [facility]  For your rash, use the Desonide cream twice daily where it is itchy. Use a thick moisturizer like vaseline morning and night.  Scout Gumbs M. Jamal Haskin, M.D.

## 2013-04-09 NOTE — Assessment & Plan Note (Signed)
Still appears to be eczematous and she has not treated it. She did not like Triamcinolone so to stay with low potency steroid, will switch to Desonide. Use Eucerin or Vaseline to keep well moisturized.

## 2013-04-09 NOTE — Assessment & Plan Note (Signed)
Now interfering with her sleep, and she has difficulty raising in lateral movement. Will refer to [facility] for ultrasound evaluation of rotator cuff. Con't Naproxen and ROM exercises.

## 2013-04-16 ENCOUNTER — Telehealth: Payer: Self-pay | Admitting: Family Medicine

## 2013-04-16 NOTE — Telephone Encounter (Signed)
Will forward to MD to advise. Jazmin Hartsell,CMA  

## 2013-04-16 NOTE — Telephone Encounter (Signed)
Called pt and let her know message from Dr. Mikel Cella.  Megan Miller,CMA

## 2013-04-16 NOTE — Telephone Encounter (Signed)
Pt called and would like some type of lotion called because she has bumps or bugs jumping on her legs. She said that this is same bumps she had on her face. JW

## 2013-04-16 NOTE — Telephone Encounter (Signed)
Patient should use the medication I prescribed for her at her last visit on her face as well as a thick lotion like vaseline or Eucerin. If this continues, she should come in to the clinic.  Eastin Swing M. Nikko Goldwire, M.D.

## 2013-04-20 ENCOUNTER — Encounter: Payer: Self-pay | Admitting: Family Medicine

## 2013-04-20 ENCOUNTER — Ambulatory Visit (INDEPENDENT_AMBULATORY_CARE_PROVIDER_SITE_OTHER): Payer: Medicaid Other | Admitting: Family Medicine

## 2013-04-20 VITALS — BP 115/77 | HR 79 | Ht 61.0 in | Wt 182.0 lb

## 2013-04-20 DIAGNOSIS — M67919 Unspecified disorder of synovium and tendon, unspecified shoulder: Secondary | ICD-10-CM

## 2013-04-20 DIAGNOSIS — M75101 Unspecified rotator cuff tear or rupture of right shoulder, not specified as traumatic: Secondary | ICD-10-CM

## 2013-04-20 NOTE — Patient Instructions (Addendum)
  F/U in 4 weeks   Rotator Cuff Tendonitis  The rotator cuff is the collection of all the muscles and tendons (the supraspinatus, infraspinatus, subscapularis, and teres minor muscles and their tendons) that help your shoulder stay in place. This unit holds the head of the upper arm bone (humerus) in the cup (fossa) of the shoulder blade (scapula). Basically, it connects the arm to the shoulder. Tendinitis is a swelling and irritation of the tissue, called cord like structures (tendons) that connect muscle to bone. It usually is caused by overusing the joint involved. When the tissue surrounding a tendon (the synovium) becomes inflamed, it is called tenosynovitis. This also is often the result of overuse in people whose jobs require repetitive (over and over again) types of motion. HOME CARE INSTRUCTIONS   Use a sling or splint for as long as directed by your caregiver until the pain decreases.  Apply ice to the injury for 15-20 minutes, 3-4 times per day. Put the ice in a plastic bag and place a towel between the bag of ice and your skin.  Try to avoid use other than gentle range of motion while your shoulder is painful. Use and exercise only as directed by your caregiver. Stop exercises or range of motion if pain or discomfort increases, unless directed otherwise by your caregiver.  Only take over-the-counter or prescription medicines for pain, discomfort, or fever as directed by your caregiver.  If you were give a shoulder sling and straps (immobilizer), do not remove it except as directed, or until you see a caregiver for a follow-up examination. If you need to remove it, move your arm as little as possible or as directed.  You may want to sleep on several pillows at night to lessen swelling and pain. SEEK IMMEDIATE MEDICAL CARE IF:   Pain in your shoulder increases or new pain develops in your arm, hand, or fingers and is not relieved with medications.  You develop new, unexplained  symptoms, especially increased numbness in the hands or loss of strength, or you develop any worsening of the problems which brought you in for care.  Your arm, hand, or fingers are numb or tingling.  Your arm, hand, or fingers are swollen, painful, or turn white or blue. Document Released: 10/06/2003 Document Revised: 10/08/2011 Document Reviewed: 05/13/2008 [facility] Patient Information 2014 [place], [place].

## 2013-04-21 NOTE — Progress Notes (Signed)
  Subjective:    Patient ID: Megan Miller, female    DOB: 05-05-1956, 57 y.o.   MRN: [id number]  HPI  Right shoulder pain for about a year. No specific injury. No surgery. Worsening over the last 1-2 months. Causing problems with sleep. Pain is aching in nature and benign of 10 at times. Worse if she reaches of her head or behind her back. The some better with rest. She's been taking ibuprofen 3 times a day which helps a little bit. She's not done physical therapy ice or heat. She's had no weakness.  Review of Systems No fever, sweats, chills.    Objective:   Physical Exam  Vital signs are reviewed GENERAL: Well-developed overweight female no acute distress Shoulder: Right. Full range of motion in all planes the rotator cuff with intact strength in all planes the rotator cuff. Distally she has intact pulses in sensation to soft touch. ULTRASOUND: Rotator cuff reveals a lot of areas of calcification but there's no evidence of tear. There is mild to moderate a.c. joint arthropathy. There is no impingement sign.  INJECTION: Patient was given informed consent, signed copy in the chart. Appropriate time out was taken. Area prepped and draped in usual sterile fashion. 1 cc of methylprednisolone 40 mg/ml plus  4 cc of 1% lidocaine without epinephrine was injected into the right shoulder using a(n) posterior approach. The patient tolerated the procedure well. There were no complications. Post procedure instructions were given. Dr. Henrine Screws assisted with injection.      Assessment & Plan:  Rotator cuff syndrome. She is very reluctant to do any kind of physical therapy or home rehabilitation program the we did give her the handout and Theragran. She did want to have a corticosteroid bursal injection today which we did and I'll see her back in 4 weeks.

## 2013-04-30 ENCOUNTER — Other Ambulatory Visit: Payer: Self-pay | Admitting: Family Medicine

## 2013-04-30 DIAGNOSIS — Z1231 Encounter for screening mammogram for malignant neoplasm of breast: Secondary | ICD-10-CM

## 2013-05-22 ENCOUNTER — Encounter: Payer: Self-pay | Admitting: Family Medicine

## 2013-05-22 ENCOUNTER — Ambulatory Visit (INDEPENDENT_AMBULATORY_CARE_PROVIDER_SITE_OTHER): Payer: Medicaid Other | Admitting: Family Medicine

## 2013-05-22 VITALS — BP 116/77 | Ht 61.0 in | Wt 180.0 lb

## 2013-05-22 DIAGNOSIS — M25511 Pain in right shoulder: Secondary | ICD-10-CM

## 2013-05-22 DIAGNOSIS — M25519 Pain in unspecified shoulder: Secondary | ICD-10-CM

## 2013-05-22 MED ORDER — NITROGLYCERIN 0.2 MG/HR TD PT24: MEDICATED_PATCH | TRANSDERMAL | Status: DC

## 2013-05-27 NOTE — Progress Notes (Signed)
  Subjective:    Patient ID: Megan Miller, female    DOB: 21-Dec-1955, 57 y.o.   MRN: [id number]  HPI Followup right shoulder pain. At last office visit we gave her a corticosteroid injection which seemed to help for couple of days. She did not do any home therapy. Her pain is back to where it was previously. No new symptoms. No new injury.   Review of Systems Denies fever, sweats, chills.    Objective:   Physical Exam Vital signs are reviewed GENERAL: Well-developed female no acute distress SHOULDER: Right. Her range of motion is actually little better than on her first visit. She can abduct to about 120 laterally and forward flex to 130. Grip strength is normal. Distally she is neurovascularly intact.       Assessment & Plan:  Rotator cuff syndrome with subacromial bursitis that responded a little bit to the corticosteroid injection. Long discussion with her today I don't think she's going to have any significant improvement when she's willing to do some home exercises. She says she'll try that and return in one month.

## 2013-06-19 ENCOUNTER — Encounter: Payer: Self-pay | Admitting: Family Medicine

## 2013-06-19 ENCOUNTER — Ambulatory Visit (INDEPENDENT_AMBULATORY_CARE_PROVIDER_SITE_OTHER): Payer: Medicaid Other | Admitting: Family Medicine

## 2013-06-19 VITALS — BP 125/81 | HR 90 | Ht 61.0 in | Wt 180.0 lb

## 2013-06-19 DIAGNOSIS — M75101 Unspecified rotator cuff tear or rupture of right shoulder, not specified as traumatic: Secondary | ICD-10-CM

## 2013-06-19 DIAGNOSIS — M67919 Unspecified disorder of synovium and tendon, unspecified shoulder: Secondary | ICD-10-CM

## 2013-06-19 DIAGNOSIS — M25511 Pain in right shoulder: Secondary | ICD-10-CM

## 2013-06-19 DIAGNOSIS — M25519 Pain in unspecified shoulder: Secondary | ICD-10-CM

## 2013-06-19 NOTE — Progress Notes (Signed)
  Subjective:    Patient ID: Megan Miller, female    DOB: [birth year], 57 y.o.   MRN: [id number]  HPI  Continued right shoulder pain. She's about 10% better. She has been doing the exercises every day sometimes twice a day. She has been using half the nitroglycerin patch as directed. She still has a little bit of headache in the first 3 hours after using that but all in all has been tolerating well. Her shoulder is still most painful at night and keeping her awake.  Review of Systems No fever, sweats, chills. No unusual numbness in her right upper extremity.    Objective:   Physical Exam Vital signs are reviewed GENERAL: Well-developed female no acute distress in SHOULDER: Right. Pain with supraspinatus testing. She now has improved forward flexion to 140. She has internal rotation to the or she can now placed the knuckles of her right hand against her buttock but cannot quite reach the midline. Distally she is neurovascularly intact.       Assessment & Plan:  Rotator cuff syndrome. We tried everything conservatively and she's had some improvement she still quite frustrated. She would be willing to consider surgery if it would help. Will set up for MRI see her back after that. In the interim I would continue that glycerin half patch and she has been doing and definitely continue the exercises. I discussed this part with her at length today.

## 2013-06-28 ENCOUNTER — Other Ambulatory Visit: Payer: Self-pay | Admitting: Family Medicine

## 2013-06-28 ENCOUNTER — Ambulatory Visit
Admission: RE | Admit: 2013-06-28 | Discharge: 2013-06-28 | Disposition: A | Discharge status: Home / Self Care | Payer: Medicaid Other | Source: Ambulatory Visit | Attending: Family Medicine | Admitting: Family Medicine

## 2013-06-28 DIAGNOSIS — M25511 Pain in right shoulder: Secondary | ICD-10-CM

## 2013-06-29 ENCOUNTER — Telehealth: Payer: Self-pay | Admitting: Family Medicine

## 2013-06-29 NOTE — Telephone Encounter (Signed)
Amy or [place] Please call her and tell her she has a rotator cuff tear---she MAY benefit from shoulder surgery--I would at least like her to see an ortyhopedist for their opinion--if she agrees plz set her up at [facility] to see Dion Saucier if possible. MRI done 11/30 [facility]! Denny Levy

## 2013-06-30 NOTE — Telephone Encounter (Signed)
Left pt a VM to return call.

## 2013-06-30 NOTE — Telephone Encounter (Signed)
Scheduled pt for appt with Dr. Dion Saucier on 07/01/13 at 9:15 am.  Pt notified of appt info.

## 2013-07-07 ENCOUNTER — Ambulatory Visit: Payer: Medicaid Other | Attending: Orthopedic Surgery

## 2013-07-08 ENCOUNTER — Telehealth: Payer: Self-pay | Admitting: Family Medicine

## 2013-07-08 MED ORDER — IBUPROFEN 800 MG PO TABS: 800.0000 mg | ORAL_TABLET | Freq: Three times a day (TID) | ORAL | Status: DC | PRN

## 2013-07-08 NOTE — Telephone Encounter (Signed)
LM making pt aware of this. Veryl Abril,CMA  

## 2013-07-08 NOTE — Telephone Encounter (Signed)
Refill request for Ibuprofen 800 mg 

## 2013-07-08 NOTE — Telephone Encounter (Signed)
Refill sent to pharmacy.  Farah Benish M. Vinita Prentiss, M.D.

## 2013-07-10 ENCOUNTER — Telehealth: Payer: Self-pay | Admitting: Family Medicine

## 2013-07-10 ENCOUNTER — Other Ambulatory Visit: Payer: Self-pay | Admitting: Family Medicine

## 2013-07-10 NOTE — Telephone Encounter (Signed)
Patient looking for PT office that accepts Medicaid.

## 2013-07-13 NOTE — Telephone Encounter (Signed)
LMOVM for pt to return call.  Medicaid does not cover ANY PT unless pt has recent surgery to repair or replace joint. Fleeger, [name]

## 2013-08-27 ENCOUNTER — Telehealth: Payer: Self-pay | Admitting: Family Medicine

## 2013-08-27 NOTE — Telephone Encounter (Signed)
Pt called because she feels that she should have a higher dose of her Aciphex. Myriam Jacobsonjw

## 2013-08-28 NOTE — Telephone Encounter (Signed)
 20mg  daily is the recommended dose, and it should really NOT be used for longer than a few months. I know she has been on it for a long time, so she may need additional work up to make sure something else isn't going on causing her reflux.  For now, she should not take more than one pill per day.  Thanks, Continental Airlinesmber M. Persephonie Hegwood, M.D.

## 2013-08-28 NOTE — Telephone Encounter (Signed)
Pt is calling to ask some questions about her recent refill. jw

## 2013-08-28 NOTE — Telephone Encounter (Signed)
LMOVM for pt to return call .Megan Miller  

## 2013-08-31 NOTE — Telephone Encounter (Signed)
LMOM for pt to call back.  Please ask her what questions about her medication she has.  Thanks Limited BrandsJazmin Jadarius Commons,CMA

## 2013-09-01 ENCOUNTER — Telehealth: Payer: Self-pay | Admitting: Family Medicine

## 2013-09-01 NOTE — Telephone Encounter (Signed)
We have gone through this before. Medicaid will only cover generic Aciphex and even that is only with a PA.  She may require further work up for her acid reflux. I am sorry it is not working but we can only do what the insurance allows or she will need to pay full price out of pocket.  Thanks, Continental Airlinesmber M. Hairford, M.D.

## 2013-09-01 NOTE — Telephone Encounter (Signed)
Tried to reach pt but no answer.  Please give her message from MD when she calls back. Svea Pusch,CMA

## 2013-09-01 NOTE — Telephone Encounter (Signed)
Patient states that Medicaid will not pay for Aciphex and the new meds are not as effective as the Aciphex. Please be advised

## 2013-09-09 ENCOUNTER — Ambulatory Visit (INDEPENDENT_AMBULATORY_CARE_PROVIDER_SITE_OTHER): Payer: Medicaid Other | Admitting: Family Medicine

## 2013-09-09 VITALS — BP 103/71 | HR 84 | Temp 97.9°F | Ht 61.0 in | Wt 192.0 lb

## 2013-09-09 DIAGNOSIS — I10 Essential (primary) hypertension: Secondary | ICD-10-CM

## 2013-09-09 DIAGNOSIS — R21 Rash and other nonspecific skin eruption: Secondary | ICD-10-CM

## 2013-09-09 DIAGNOSIS — K219 Gastro-esophageal reflux disease without esophagitis: Secondary | ICD-10-CM

## 2013-09-09 DIAGNOSIS — L659 Nonscarring hair loss, unspecified: Secondary | ICD-10-CM

## 2013-09-09 DIAGNOSIS — Z23 Encounter for immunization: Secondary | ICD-10-CM

## 2013-09-09 LAB — CBC
HCT: 33.7 % — ABNORMAL LOW (ref 36.0–46.0)
Hemoglobin: 11.3 g/dL — ABNORMAL LOW (ref 12.0–15.0)
MCH: 28.6 pg (ref 26.0–34.0)
MCHC: 33.5 g/dL (ref 30.0–36.0)
MCV: 85.3 fL (ref 78.0–100.0)
PLATELETS: 297 10*3/uL (ref 150–400)
RBC: 3.95 MIL/uL (ref 3.87–5.11)
RDW: 14.1 % (ref 11.5–15.5)
WBC: 6.2 10*3/uL (ref 4.0–10.5)

## 2013-09-09 LAB — BASIC METABOLIC PANEL
BUN: 20 mg/dL (ref 6–23)
CALCIUM: 9.3 mg/dL (ref 8.4–10.5)
CO2: 29 mEq/L (ref 19–32)
Chloride: 103 mEq/L (ref 96–112)
Creat: 1.11 mg/dL — ABNORMAL HIGH (ref 0.50–1.10)
Glucose, Bld: 107 mg/dL — ABNORMAL HIGH (ref 70–99)
Potassium: 3.8 mEq/L (ref 3.5–5.3)
SODIUM: 139 meq/L (ref 135–145)

## 2013-09-09 LAB — POCT H PYLORI SCREEN: H PYLORI SCREEN, POC: NEGATIVE

## 2013-09-09 LAB — LIPID PANEL
CHOL/HDL RATIO: 2.8 ratio
CHOLESTEROL: 142 mg/dL (ref 0–200)
HDL: 51 mg/dL (ref >39)
LDL Cholesterol: 72 mg/dL (ref 0–99)
Triglycerides: 97 mg/dL (ref <150)
VLDL: 19 mg/dL (ref 0–40)

## 2013-09-09 NOTE — Patient Instructions (Signed)
It was good to see you today.  I will call you with any abnormal labs.  I will see you back in 3 months or sooner if you need anything! Take care!  Amber M. Hairford, M.D.

## 2013-09-10 ENCOUNTER — Telehealth: Payer: Self-pay | Admitting: Family Medicine

## 2013-09-10 DIAGNOSIS — L659 Nonscarring hair loss, unspecified: Secondary | ICD-10-CM | POA: Insufficient documentation

## 2013-09-10 LAB — TSH: TSH: 1.776 u[IU]/mL (ref 0.350–4.500)

## 2013-09-10 NOTE — Telephone Encounter (Signed)
For the face, she should stop using the steroid cream completely for now. Keep her face moisturized with over the counter creams or ointments (such as vaseline, eucerin, cetaphil, oil of olay, etc.) She should use moisturizer at least twice daily. She does not need a prescription for it right now.  For her hairloss she should try to go without hat or scarf as much as possible, I think that is rubbing her hair some. Her thyroid test came back today and it was normal so that is good. I think this could be related to her age, as women age we lose all hormones that can cause hair thinning. We will keep an eye on it.  Thanks for letting her know this! Antwian Santaana M. Shannelle Alguire, M.D.

## 2013-09-10 NOTE — Assessment & Plan Note (Signed)
Appears to be friction causing her hair loss. Encouraged to decrease how much she is wearing a hat or scarf on her head. Will check Thyroid. Also possible it is post-menopausal changes. Con't to monitor.

## 2013-09-10 NOTE — Assessment & Plan Note (Signed)
Will stay on generic aciphex for now. Check H.pylori today.

## 2013-09-10 NOTE — Telephone Encounter (Signed)
Pt is aware of this. Romir Klimowicz,CMA  

## 2013-09-10 NOTE — Assessment & Plan Note (Signed)
At goal. Labs today. 

## 2013-09-10 NOTE — Telephone Encounter (Signed)
Will forward to Dr. Mikel CellaHairford.  Encounter closed by mistake. Kelsie Zaborowski,CMA

## 2013-09-10 NOTE — Telephone Encounter (Signed)
Patient seen yesterday, can not remember what Dr. Mikel CellaHairford said about what to use for dryness of face & her hair loss. Please advise

## 2013-09-10 NOTE — Assessment & Plan Note (Signed)
Appears improved. Stop steroid cream and keep well moisturized. Most likely dry skin/eczema. F/u prn

## 2013-09-10 NOTE — Progress Notes (Signed)
Patient ID: Megan LloydVickie L Miller, female   DOB: 1956/07/06, 58 y.o.   MRN: [id number]    Subjective: HPI: Patient is a 58 y.o. female presenting to clinic today for follow up appointment. Concerns today include GERD, hair loss and face rash  1. GERD- Long standing acid reflux. Tried multiple medications, and states that name brand Aciphex is the only thing that works. We have had many issues in the past getting this medication for her and it came down to out of pocket expense if she wants the name brand. She states she has cut back on red sauces, greasy foods and other triggers but still has symptoms. Never been checked for Hpylori as far as I can tell.  2. Hair loss- Going on for months to years. Notices is on side of forehead and her hair is thin. S/p hysterectomy but unsure if her ovaries were removed. No thyroid symptoms. Unsure if other women in her family has this issue. Wears hats often.  3. Face rash- Has bumpy rash on face since last year. Was given 2 different low dose steroids to try short term, but she cont to use these intermittently. She denies itching, redness or certain distribution to rash.   History Reviewed: Non smoker. Health Maintenance: Pap is not UTD  ROS: Please see HPI above.  Objective: Office vital signs reviewed. BP 103/71  Pulse 84  Temp(Src) 97.9 F (36.6 C) (Oral)  Ht 5\' 1"  (1.549 m)  Wt 192 lb (87.091 kg)  BMI 36.30 kg/m2  Physical Examination:  General: Awake, alert. NAD HEENT: Atraumatic, normocephalic. Hair feels broken on forehead, but still there. Thinning of hair on crown. No scalp dryness. Fine, papular rash on areas of face, no redness or excoriation Neck: No masses palpated. No LAD. Thyroid not enlarged Pulm: CTAB, no wheezes Cardio: RRR, no murmurs appreciated Abdomen:+BS, soft, nontender, nondistended Extremities: No edema Neuro: Grossly intact  Assessment: 58 y.o. female follow up  Plan: See Problem List and After Visit Summary

## 2013-09-16 ENCOUNTER — Encounter: Payer: Self-pay | Admitting: Family Medicine

## 2013-09-28 ENCOUNTER — Telehealth: Payer: Self-pay | Admitting: Family Medicine

## 2013-09-28 NOTE — Telephone Encounter (Signed)
Would like to have the powder that you mix with water that makes you go to the bathroom called in

## 2013-09-29 ENCOUNTER — Other Ambulatory Visit: Payer: Self-pay | Admitting: Family Medicine

## 2013-09-29 MED ORDER — POLYETHYLENE GLYCOL 3350 17 GM/SCOOP PO POWD: ORAL | Status: DC

## 2013-09-29 NOTE — Telephone Encounter (Signed)
Rx sent for miralax.  Thank you, Markie Frith M. Jazmene Racz, M.D.

## 2013-09-29 NOTE — Telephone Encounter (Signed)
LMOVM informing pt that "rx he requested was sent in". Megan Miller, Megan Miller

## 2013-10-05 ENCOUNTER — Telehealth: Payer: Self-pay | Admitting: Family Medicine

## 2013-10-05 MED ORDER — POLYETHYLENE GLYCOL 3350 17 GM/SCOOP PO POWD: ORAL | Status: DC

## 2013-10-05 NOTE — Telephone Encounter (Signed)
LMOVM informing pt that "[facility] [address]". Fleeger, [name]

## 2013-10-05 NOTE — Telephone Encounter (Signed)
[facility] [address] instead of [facility]. CAll patient once meds has been transferred.

## 2013-11-01 ENCOUNTER — Other Ambulatory Visit: Payer: Self-pay | Admitting: Family Medicine

## 2013-11-05 ENCOUNTER — Other Ambulatory Visit: Payer: Self-pay | Admitting: Family Medicine

## 2013-11-27 ENCOUNTER — Ambulatory Visit (INDEPENDENT_AMBULATORY_CARE_PROVIDER_SITE_OTHER): Payer: Medicaid Other | Admitting: Family Medicine

## 2013-11-27 VITALS — BP 108/70 | HR 75 | Temp 97.7°F | Wt 190.6 lb

## 2013-11-27 DIAGNOSIS — R232 Flushing: Secondary | ICD-10-CM | POA: Insufficient documentation

## 2013-11-27 DIAGNOSIS — M545 Low back pain, unspecified: Secondary | ICD-10-CM

## 2013-11-27 DIAGNOSIS — N951 Menopausal and female climacteric states: Secondary | ICD-10-CM

## 2013-11-27 DIAGNOSIS — L659 Nonscarring hair loss, unspecified: Secondary | ICD-10-CM

## 2013-11-27 LAB — POCT URINALYSIS DIPSTICK
Bilirubin, UA: NEGATIVE
Blood, UA: NEGATIVE
GLUCOSE UA: NEGATIVE
Ketones, UA: NEGATIVE
LEUKOCYTES UA: NEGATIVE
NITRITE UA: NEGATIVE
Protein, UA: NEGATIVE
Spec Grav, UA: 1.025
UROBILINOGEN UA: 0.2
pH, UA: 5.5

## 2013-11-27 MED ORDER — MINOXIDIL 2 % EX SOLN: Freq: Two times a day (BID) | CUTANEOUS | Status: DC

## 2013-11-27 MED ORDER — RABEPRAZOLE SODIUM 20 MG PO TBEC
DELAYED_RELEASE_TABLET | ORAL | Status: DC
Start: 2013-11-27 — End: 2013-12-22

## 2013-11-27 MED ORDER — SULFAMETHOXAZOLE-TRIMETHOPRIM 800-160 MG PO TABS: 1.0000 | ORAL_TABLET | Freq: Two times a day (BID) | ORAL | Status: DC

## 2013-11-27 NOTE — Assessment & Plan Note (Signed)
A: Irregular.  P: - Monitor for increased frequency or worsening - No treatment at this time

## 2013-11-27 NOTE — Progress Notes (Signed)
Patient ID: Megan LloydVickie Miller Megan Miller, female   DOB: May 14, 1956, 58 y.o.   MRN: [id number]    Subjective: HPI: Patient is a 58 y.o. female presenting to clinic today for back pain, hair loss, hot flashes  1. Back pain- States she has been drinking a lot of tea. Started 6 days ago. Pain is about the same as it has been. Hurts more with urination. Increased frequency. Urine does not have an odor. No dysuria. Increased water intake which calmed it down. Feels like she has a UTI.  2. Hair loss- Still concerned about hair thinning along forehead. Wears hat, scarf or wig in public. But does not wear anything at home. No known history of hair loss in other members.   3. Hot flashes- Had an episode 1 month ago when she felt hot, dizzy and nauseated. States she went to the car to rest and it went away. She states she had taken her medication and it resolved on its own with in 5 min.    History Reviewed: Non-smoker.  ROS: Please see HPI above.  Objective: Office vital signs reviewed. BP 108/70  Pulse 75  Temp(Src) 97.7 F (36.5 C) (Oral)  Wt 190 lb 9.6 oz (86.456 kg)  Physical Examination:  General: Awake, alert. NAD HEENT: Atraumatic, normocephalic. Mild thinning of hair along forehead. Hair appears broken. Neck: No masses palpated. No LAD Pulm: CTAB, no wheezes Cardio: RRR, no murmurs appreciated Abdomen:+BS, soft, nondistended. +TTP suprapubic Extremities: No edema Neuro: Grossly intact  Assessment: 58 y.o. female office visit  Plan: See Problem List and After Visit Summary

## 2013-11-27 NOTE — Assessment & Plan Note (Signed)
A Associated with urinary frequency and suprapubic pain on exam. UA unremarkable.  P: - Increased PO intake - Bactrim x3 days - F/u if fail to improve

## 2013-11-27 NOTE — Assessment & Plan Note (Signed)
A: Loss appears to frictional. Hair is thick on scalp  P: - Avoid hats, scarves, etc. - Rogaine daily

## 2013-11-27 NOTE — Patient Instructions (Signed)
It was good to see you today.  Take the Bactrim for your urine. Use the cream on your hair.  If you have more of the hot flash symptoms, let me know.  Amber M. Hairford, M.D.

## 2013-12-11 ENCOUNTER — Ambulatory Visit: Payer: Medicaid Other | Admitting: Family Medicine

## 2013-12-17 ENCOUNTER — Encounter: Payer: Self-pay | Admitting: *Deleted

## 2013-12-17 NOTE — Progress Notes (Signed)
[facility] 20 [facility]. Formulary and PA form placed in provider box for completion. Clovis Puamika L Martin, RN

## 2013-12-22 ENCOUNTER — Telehealth: Payer: Self-pay | Admitting: Family Medicine

## 2013-12-22 MED ORDER — PANTOPRAZOLE SODIUM 40 MG PO TBEC: 40.0000 mg | DELAYED_RELEASE_TABLET | Freq: Every day | ORAL | Status: DC

## 2013-12-22 NOTE — Progress Notes (Signed)
Patient prefers for Protonix to be sent instead. Will discard PA and new Rx sent.  Kenetha Cozza M. Marnell Mcdaniel, M.D.

## 2013-12-22 NOTE — Telephone Encounter (Signed)
Pt called and would like a refill on her Patoprazole instead of the Aciphex called in. jw

## 2013-12-22 NOTE — Telephone Encounter (Signed)
Protonix sent and Aciphex Rx canceled. Will also discard PA received from pharmacy for the Aciphex.  Thanks, [employer]. Hairford, M.D.

## 2014-01-22 ENCOUNTER — Encounter: Payer: Self-pay | Admitting: *Deleted

## 2014-01-22 NOTE — Progress Notes (Signed)
[facility]. Formulary and PA form placed in provider box for completion. Clovis PuMartin, Wilkin Lippy L, RN

## 2014-01-25 NOTE — Progress Notes (Signed)
[facility] and [facility].  Amber M. Hairford, M.D.

## 2014-01-25 NOTE — Progress Notes (Signed)
Received PA approval for Rabeprazole SOD DR 20 mg tab via [employer].  Med approved for 01/25/2014 - 01/25/2015.  [facility].  PA confirmation number [id number] Link SnufferW. Martin, Bronson Ingamika L, RN

## 2014-02-14 ENCOUNTER — Other Ambulatory Visit: Payer: Self-pay | Admitting: Family Medicine

## 2014-02-15 NOTE — Telephone Encounter (Signed)
Pt is aware of this and will call back to schedule an appt.  She is dealing with a recent death in her family right now.  Megan Miller,CMA

## 2014-02-15 NOTE — Telephone Encounter (Signed)
Patient should be advised that she will need a follow-up appointment for refill of this medication. Thanks.

## 2014-03-01 ENCOUNTER — Ambulatory Visit (HOSPITAL_COMMUNITY): Payer: Medicaid Other

## 2014-03-22 ENCOUNTER — Ambulatory Visit (INDEPENDENT_AMBULATORY_CARE_PROVIDER_SITE_OTHER): Payer: Medicaid Other | Admitting: Family Medicine

## 2014-03-22 ENCOUNTER — Encounter: Payer: Self-pay | Admitting: Family Medicine

## 2014-03-22 VITALS — BP 108/62 | HR 72 | Ht 61.0 in | Wt 182.0 lb

## 2014-03-22 DIAGNOSIS — R259 Unspecified abnormal involuntary movements: Secondary | ICD-10-CM

## 2014-03-22 DIAGNOSIS — R253 Fasciculation: Secondary | ICD-10-CM

## 2014-03-22 DIAGNOSIS — G479 Sleep disorder, unspecified: Secondary | ICD-10-CM | POA: Insufficient documentation

## 2014-03-22 NOTE — Assessment & Plan Note (Signed)
Patient with difficulty sleeping for several months. 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 Discussed developing a bed time routine, decreasing caffeine intake, and stopping TV watching one hour prior to going to bed. I discussed that medication would not be first line therapy for helping her sleep and she was amenable to making the above changes. It was noted that the patient has a history of depression after she had already left the office. This must be considered as a possible cause at her follow-up visit, especially if changes to sleep hygiene have not improved her sleep. She is to follow-up in 2 weeks.

## 2014-03-22 NOTE — Assessment & Plan Note (Signed)
Patient with what sounds like muscle twitching by history. No abnormalities on exam. Discussed that this is likely muscle twitching. If this continues to occur or if develops pain, swelling, or erythema in the area she is to follow-up.

## 2014-03-22 NOTE — Patient Instructions (Signed)
Nice to meet you. I believe you are having a muscle twitch.  You should do the following to help with your sleep.  Stop watching TV one hour prior to going to bed.   Develop a night time routine.  Read before bed.  Take a warm shower before bed.   Stop drinking tea by 2 pm.  I will see you back in 2-4 weeks to follow-up on your sleep.

## 2014-03-22 NOTE — Progress Notes (Signed)
Patient ID: IDY RAWLING, female   DOB: 11-29-1955, 58 y.o.   MRN: [id number]  Marikay Alar, MD Phone: [phone]  Megan Miller is a 58 y.o. female who presents today for same day appointment.  Twitching in right leg: patient notes for the past 1-2 weeks has had a sensation of "jiggling like jello" in her right leg in the area of her mid shin. She notes this is worsened when she walks of puts pressure on the right leg. Has not occurred in her left leg. There is no pain, swelling, or erythema. Nothing makes it better. It lasts for about 1 second. She has not tried any medications for this. She has never had this before.  Difficulty sleeping: notes it takes 1-2 hours to fall asleep at night. Then sleeps for 3-4 hours, wakes up for an hour, then falls back asleep. She notes she tries to go to bed at 10 pm. She notes she does not have a bed time routine. She watches TV right up until she tries to go to bed. She drinks cafinated tea 1-2x daily with the last drink at 7-8 pm. She has trazodone on her med list though reports she does not take this.   Patient is a nonsmoker.   ROS: Per HPI   Physical Exam Filed Vitals:   03/22/14 1012  BP: 108/62  Pulse: 72    Gen: Well NAD HEENT: PERRL,  MMM Lungs: CTABL Nl WOB Heart: RRR no MRG MSK: bilateral LE with no signs of swelling or erythema, no tenderness to palpation, no evident muscle twitches when patient walks or places pressure on the right leg Neuro: 5/5 strength in bilateral quads, hamstrings, plantar and dorsiflexion, sensation to light touch intact in bilateral LE, normal gait, 2+ patellar reflexes Exts: Non edematous BL  LE, warm and well perfused.    Assessment/Plan: Please see individual problem list.  # Healthcare maintenance: needs to schedule an appointment for a pap smear  Marikay Alar, MD [facility] PGY-3

## 2014-03-31 ENCOUNTER — Ambulatory Visit (HOSPITAL_COMMUNITY)
Admission: RE | Admit: 2014-03-31 | Discharge: 2014-03-31 | Disposition: A | Discharge status: Home / Self Care | Payer: Medicaid Other | Source: Ambulatory Visit | Attending: Family Medicine | Admitting: Family Medicine

## 2014-03-31 DIAGNOSIS — Z1231 Encounter for screening mammogram for malignant neoplasm of breast: Secondary | ICD-10-CM | POA: Insufficient documentation

## 2014-04-19 ENCOUNTER — Telehealth: Payer: Self-pay | Admitting: Family Medicine

## 2014-04-19 MED ORDER — RABEPRAZOLE SODIUM 20 MG PO TBEC: 20.0000 mg | DELAYED_RELEASE_TABLET | Freq: Every day | ORAL | Status: DC

## 2014-04-19 NOTE — Telephone Encounter (Signed)
Spoke with pt regarding Protonix.  Pt stated she can not tolerate the Protonix; she can't sleep and feels like her throat is swelling.  Pt would like to restart Rabeprazole; since she did not have any problems with this medication.  Clovis Pu, RN

## 2014-04-19 NOTE — Telephone Encounter (Signed)
Pt called and would like the nurse to call her concerning her throat and new medication. jw

## 2014-04-19 NOTE — Telephone Encounter (Signed)
I will restart the patients rabeprazole. Please call her and advise her that she needs to be seen if she is having issues with her throat. If it is swelling she needs to be seen today either in the office or in the emergency room. If she is having any trouble breathing please inform the patient she needs to be evaluated in the ED. Thanks.

## 2014-04-19 NOTE — Telephone Encounter (Signed)
Spoke with pt and informed her that Rabeprazole was sent in to the pharmacy.  Pt advised she need an appt or go to ED if she is having breathing problems after taking the Protonix.  Pt stated it feels more like she is choking.  She only has that problem about a hour after she takes the medication and falls a sleep.  Pt advised again to go to urgent care/ED if she is having that problem.  She should also follow up with her PCP. Pt stated understanding and will call back to schedule an appt with PCP.  Clovis Pu, RN

## 2014-04-22 ENCOUNTER — Ambulatory Visit (INDEPENDENT_AMBULATORY_CARE_PROVIDER_SITE_OTHER): Payer: Medicaid Other | Admitting: Family Medicine

## 2014-04-22 ENCOUNTER — Encounter: Payer: Self-pay | Admitting: Family Medicine

## 2014-04-22 VITALS — BP 128/84 | HR 78 | Temp 98.2°F | Wt 188.0 lb

## 2014-04-22 DIAGNOSIS — R05 Cough: Secondary | ICD-10-CM

## 2014-04-22 DIAGNOSIS — Z23 Encounter for immunization: Secondary | ICD-10-CM

## 2014-04-22 DIAGNOSIS — R059 Cough, unspecified: Secondary | ICD-10-CM

## 2014-04-22 MED ORDER — OMEPRAZOLE 40 MG PO CPDR: 40.0000 mg | DELAYED_RELEASE_CAPSULE | Freq: Every day | ORAL | Status: DC

## 2014-04-22 MED ORDER — LORATADINE 10 MG PO TABS: 10.0000 mg | ORAL_TABLET | Freq: Every day | ORAL | Status: DC

## 2014-04-22 NOTE — Progress Notes (Signed)
Patient ID: Megan Miller, female   DOB: 03/04/1956, 58 y.o.   MRN: [id number] Subjective:   CC: itchy sore throat  HPI:  58 y.o.  female presenting to same day clinic for itchy sore throat. H/o HTN, acid reflux, dysthymic disorder, difficulty with sleep, and depression. She reports she has had itchy sore throat for 6 days. She has had a dry cough and has been occasionally coughing up food. She has also intermittently felt hot. She reports some very mild lightheadedness but denies a room spinning sensation or even feeling close to syncope. She denies sick contacts. She had similar symptoms last year too. She reports no dyspnea but some chest pain when she coughs and not when she is not coughing. She had one episode of dyspnea and mild chest pain with no coughing about 1 week ago when she walked about 10 minutes. She denies current pain, lightheadedness, nausea, vomiting, abdominal pain, leg swelling, or other concern. She has not had medication changes other than switching to aciphex recently from ranitidine. She is unsure if it is helping due to her throat pain.  Review of Systems - Per HPI.   PMH: Reviewed Medications: Reports recent change to aciphex from ranitidine. SH: Does not work No recent travel  Objective:  Physical Exam BP 128/84  Pulse 78  Temp(Src) 98.2 F (36.8 C) (Oral)  Wt 188 lb (85.276 kg) GEN: NAD, seated in exam room CV: RRR, no m/r/g PULM: CTAB, normal effort HEENT: O/p clear, sclera clear, EOMI, PERRL, no sinus tenderness, neck supple EXTR: No LE edema    Assessment:     Megan Miller is a 58 y.o. female here for sore and itchy throat.    Plan:     Sore throat Dry cough with sore throat, does not report any hemoptysis or weight change or night sweats. 6 days of symptoms. Most likely a viral URI with this timecourse, though with itching could also be allergies. Consider GERD as well with hx of reflux. Chest pain possibly MSK due to coughing, and not  currently present. AF with normal VS. Unlikely but possible contribution from lisinopril. - Drink plenty of fluids and get lots of rest. - Take claritin daily. - Omeprazole  QHS for 2 weeks. - Follow up in 2-4 weeks if not fully improved. - Follow up in 5-7 days if not improving at all or if worsened. - Seek immediate care if you have trouble breathing, chest pain, dizziness or other concerns.  Health maintenance: Flu shot today  Follow-up: Follow up in 5-7 days PRN no improving/worsened.   Leona Singleton, MD [facility]

## 2014-04-22 NOTE — Patient Instructions (Addendum)
This could be a viral upper respiratory infection, allergies, or your reflux. Drink plenty of fluids and get lots of rest. Take claritin daily. Take omeprazole every night before bed for 2 weeks. Follow up in 2-4 weeks if not fully improved. Follow up in 5-7 days if not improving at all or if worsened. Seek immediate care if you have trouble breathing, chest pain, or other concerns.  If you have any more episodes of chest pain or dizziness you need to be evaluated immediately.  Best,  Leona Singleton, MD

## 2014-04-24 DIAGNOSIS — R05 Cough: Secondary | ICD-10-CM | POA: Insufficient documentation

## 2014-04-24 DIAGNOSIS — R059 Cough, unspecified: Secondary | ICD-10-CM | POA: Insufficient documentation

## 2014-04-24 NOTE — Assessment & Plan Note (Signed)
Dry cough with sore throat, does not report any hemoptysis or weight change or night sweats. 6 days of symptoms. Most likely a viral URI with this timecourse, though with itching could also be allergies. Consider GERD as well with hx of reflux. Chest pain possibly MSK due to coughing, and not currently present. AF with normal VS. Unlikely but possible contribution from lisinopril. - Drink plenty of fluids and get lots of rest. - Take claritin daily. - Omeprazole  QHS for 2 weeks. - Follow up in 2-4 weeks if not fully improved. - Follow up in 5-7 days if not improving at all or if worsened. - Seek immediate care if you have trouble breathing, chest pain, dizziness or other concerns.

## 2014-04-26 NOTE — Progress Notes (Signed)
Patient ID: Megan Miller, female   DOB: 1955/08/17, 58 y.o.   MRN: [id number] Reviewed and agree with resident documentation.  Donnella Sham MD

## 2014-05-10 ENCOUNTER — Ambulatory Visit: Payer: Medicaid Other | Admitting: Family Medicine

## 2014-06-08 ENCOUNTER — Encounter: Payer: Self-pay | Admitting: Family Medicine

## 2014-06-08 ENCOUNTER — Ambulatory Visit (INDEPENDENT_AMBULATORY_CARE_PROVIDER_SITE_OTHER): Payer: Medicaid Other | Admitting: Family Medicine

## 2014-06-08 VITALS — BP 110/59 | HR 84 | Temp 98.1°F | Ht 61.0 in | Wt 192.5 lb

## 2014-06-08 DIAGNOSIS — R141 Gas pain: Secondary | ICD-10-CM

## 2014-06-08 DIAGNOSIS — R142 Eructation: Secondary | ICD-10-CM

## 2014-06-08 DIAGNOSIS — R143 Flatulence: Secondary | ICD-10-CM

## 2014-06-08 MED ORDER — SIMETHICONE 80 MG PO CHEW: 80.0000 mg | CHEWABLE_TABLET | Freq: Four times a day (QID) | ORAL | Status: DC | PRN

## 2014-06-08 MED ORDER — ONDANSETRON 4 MG PO TBDP: 4.0000 mg | ORAL_TABLET | Freq: Three times a day (TID) | ORAL | Status: DC | PRN

## 2014-06-08 NOTE — Patient Instructions (Addendum)
We are going to try several things to get you feeling better:  1) Cut out any milk products for the next 5 days.  See if this makes you feel better. 2) Try to eat 4-6 smaller meals a day rather than 1-2 big meals.  Big meals often makes people's stomachs hurt. 3)  Take the Simethicone for gas relief. 4) Take the Zofran for nausea if you need it. 5) If you continue to have loose stools after these changes, try Immodium over the counter.  Do NOT take the Ibuprofen, Naproxyn, and Meloxicam together.  Choose just 1 of these for pain relief.   If these things haven't helped in the next 10 - 14 days, come back so we can check on you.  It was good to meet you today!

## 2014-06-08 NOTE — Progress Notes (Signed)
Subjective:    Megan Miller is a 58 y.o. female who [facility] and abdominal pain:  1.  Gas/abdominal pain:  Present "off and on" for about a month.  Occurs 2-3 times a week.  Can last for half a day.  Has noted increased loose stools for past week or so.  No one else at home with similar symptoms.  Does have some occasional nausea as well.  No hematemesis, no melena.  No weight loss.  Has been on Aciphex (restarted on this) which vastly helped her GERD symptoms over and above Prilosec.    States flatulence is bothersome to her.  Tried OTC Beano without relief.  Also, on med list has Ibuprofen, Naprosyn, and Meloxicam listed.  Doesn't know about Meloxicam, but is taking both Naprosyn and Ibuprofen together.   ROS as above per HPI, otherwise neg.   The following portions of the patient's history were reviewed and updated as appropriate: allergies, current medications, past medical history, family and social history, and problem list. Patient is a nonsmoker.    PMH reviewed.  Past Medical History  Diagnosis Date  . Allergic conjunctivitis   . Leg edema     Asymetric Right leg (dopplers negative)  . Chronic back pain   . Chest pain   . Constipation, chronic   . Dyspnea on exertion   . Dysthymic   . GERD (gastroesophageal reflux disease)   . Hypertension   . Overweight (BMI 25.0-29.9)   . Urination frequency    Past Surgical History  Procedure Laterality Date  . Bilateral salpingoophorectomy  05/08/2001    Path=extensive endometriosis  . Abdominal hysterectomy  09/28/1999    for leimyomas  . Breast surgery  12/29/2003    Right breast core biopsy=benign fibrocystic change  . Small intestine surgery  03/30/2001    Rectosigmoid bowel resection and reanastomosis    Medications reviewed. Current Outpatient Prescriptions  Medication Sig Dispense Refill  . aspirin 81 MG tablet Take 81 mg by mouth daily.      . carvedilol (COREG) 25 MG tablet TAKE 1 TABLET  BY MOUTH TWICE A DAY 60 tablet 5  . desonide (DESOWEN) 0.05 % cream Apply topically 2 (two) times daily. 30 g 0  . ibuprofen (ADVIL,MOTRIN) 800 MG tablet Take 1 tablet (800 mg total) by mouth every 8 (eight) hours as needed. 60 tablet 5  . lisinopril-hydrochlorothiazide (PRINZIDE,ZESTORETIC) 20-12.5 MG per tablet TAKE 1 TABLET BY MOUTH DAILY. 30 tablet 11  . loratadine (CLARITIN) 10 MG tablet Take 1 tablet (10 mg total) by mouth daily. 30 tablet 11  . minoxidil (ROGAINE) 2 % external solution Apply topically 2 (two) times daily. 60 mL 0  . nitroGLYCERIN (NITRODUR - DOSED IN MG/24 HR) 0.2 mg/hr patch APPLY 1/4 PATCH TO AFFECTED AREA. CHANGE PATCH EVERY 24 HOURS. 30 patch 1  . nitroGLYCERIN (NITROSTAT) 0.4 MG SL tablet Place 0.4 mg under the tongue every 5 (five) minutes as needed. For chest pain    . olopatadine (PATANOL) 0.1 % ophthalmic solution Place 1 drop into both eyes 2 (two) times daily. For allergy symptoms     . ondansetron (ZOFRAN ODT) 4 MG disintegrating tablet Take 1 tablet (4 mg total) by mouth every 8 (eight) hours as needed for nausea or vomiting. 20 tablet 0  . polyethylene glycol powder (GLYCOLAX/MIRALAX) powder TAKE 17GRAMS IN A FULL GLASS OF WATER DAILY 527 g 2  . RABEprazole (ACIPHEX) 20 MG tablet Take 1 tablet (20 mg  total) by mouth daily. 30 tablet 3  . simethicone (GAS-X) 80 MG chewable tablet Chew 1 tablet (80 mg total) by mouth every 6 (six) hours as needed for flatulence. 30 tablet 0  . traZODone (DESYREL) 50 MG tablet Take 50 mg by mouth at bedtime as needed. For sleep     No current facility-administered medications for this visit.     Objective:   Physical Exam BP 110/59 mmHg  Pulse 84  Temp(Src) 98.1 F (36.7 C) (Oral)  Ht 5\' 1"  (1.549 m)  Wt 192 lb 8 oz (87.317 kg)  BMI 36.39 kg/m2 Gen:  Alert, cooperative patient who appears stated age in no acute distress.  Vital signs reviewed. HEENT: EOMI,  MMM Cardiac:  Regular rate and rhythm without murmur  auscultated.  Good S1/S2. Abd:  Soft/ND.  Minimally tender epigastrum.  No lower quadrant tenderness.  Good BS throughout.    No results found for this or any previous visit (from the past 72 hour(s)).

## 2014-06-08 NOTE — Assessment & Plan Note (Signed)
No red flags. Less likely she is having bacterial overgrowth, though possible with high dose acid suppression. Simethicone for relief.  Zofran if any nausea. See the instructions for more details.  Of note, instructed her to stop the Naproxyn and Meloxiam as Ibuprofen provides her the most relief.  Discussed this might be contributing to worsening of her symptoms.   FU in 10 - 14 days if no relief.

## 2014-06-09 ENCOUNTER — Other Ambulatory Visit: Payer: Self-pay | Admitting: Family Medicine

## 2014-06-09 NOTE — Telephone Encounter (Signed)
Spoke with patient and informed her of below 

## 2014-06-09 NOTE — Telephone Encounter (Signed)
LVM for patient to call back. [facility]

## 2014-06-09 NOTE — Telephone Encounter (Signed)
Patient stated her Doctor was going to give her two prescriptions, but she didn't get the second one that is for gasses. Follow up with Patient

## 2014-06-11 ENCOUNTER — Encounter: Payer: Self-pay | Admitting: Cardiology

## 2014-06-11 ENCOUNTER — Encounter: Payer: Self-pay | Admitting: Internal Medicine

## 2014-06-30 ENCOUNTER — Telehealth: Payer: Self-pay | Admitting: Family Medicine

## 2014-06-30 NOTE — Telephone Encounter (Signed)
The patient should be advised that she will need to follow-up in clinic. Per Dr Tyson AliasWalden's office note she was to follow-up if not improving after 10-14 days.

## 2014-06-30 NOTE — Telephone Encounter (Signed)
No answer and no machine.  Will await callback. Tyia Binford Dawn  

## 2014-06-30 NOTE — Telephone Encounter (Signed)
Pt called because on her last visit she was told to but OTC Gas X. She did but her problem came back and now Gas X is not helping. She would like something called in to the pharmacy that would be stronger.jw

## 2014-08-06 ENCOUNTER — Other Ambulatory Visit: Payer: Self-pay | Admitting: *Deleted

## 2014-08-09 ENCOUNTER — Other Ambulatory Visit: Payer: Self-pay | Admitting: *Deleted

## 2014-08-09 DIAGNOSIS — I10 Essential (primary) hypertension: Secondary | ICD-10-CM

## 2014-08-09 MED ORDER — IBUPROFEN 800 MG PO TABS: 800.0000 mg | ORAL_TABLET | Freq: Three times a day (TID) | ORAL | Status: DC | PRN

## 2014-08-09 MED ORDER — LISINOPRIL-HYDROCHLOROTHIAZIDE 20-12.5 MG PO TABS: 1.0000 | ORAL_TABLET | Freq: Every day | ORAL | Status: DC

## 2014-08-09 NOTE — Telephone Encounter (Signed)
LMOVM for pt to return call.  Please inform of the below message. Fleeger, [name]  

## 2014-08-09 NOTE — Telephone Encounter (Signed)
Refills given. Please advise the patient that she needs to come in for a BMET to check her kidney function some time in the next month. Orders have been placed. Thanks.

## 2014-08-10 ENCOUNTER — Other Ambulatory Visit: Payer: Self-pay | Admitting: Family Medicine

## 2014-08-13 ENCOUNTER — Telehealth: Payer: Self-pay | Admitting: Family Medicine

## 2014-08-13 NOTE — Telephone Encounter (Signed)
LM for patient to call back.  Please ask the name of the medication she would like to restart. Vere Diantonio,CMA

## 2014-08-13 NOTE — Telephone Encounter (Signed)
Pt called and said that the new acid reflux medication is not helping her at all and she would like to go back to the one she was taking before. jw

## 2014-08-13 NOTE — Telephone Encounter (Signed)
Please find out from the patient the name of her previous medication. It appears she has been on a number of reflux medications, though the most recent was aciphex, which it appears she has been on for at least the past year based on our records. Thanks.

## 2014-08-31 ENCOUNTER — Other Ambulatory Visit: Payer: Medicaid Other

## 2014-08-31 DIAGNOSIS — I10 Essential (primary) hypertension: Secondary | ICD-10-CM

## 2014-08-31 LAB — BASIC METABOLIC PANEL
BUN: 18 mg/dL (ref 6–23)
CHLORIDE: 104 meq/L (ref 96–112)
CO2: 28 mEq/L (ref 19–32)
Calcium: 9.2 mg/dL (ref 8.4–10.5)
Creat: 1.12 mg/dL — ABNORMAL HIGH (ref 0.50–1.10)
Glucose, Bld: 94 mg/dL (ref 70–99)
POTASSIUM: 3.8 meq/L (ref 3.5–5.3)
Sodium: 138 mEq/L (ref 135–145)

## 2014-08-31 NOTE — Progress Notes (Signed)
BMP DONE TODAY Viraaj Vorndran 

## 2014-09-01 ENCOUNTER — Encounter: Payer: Self-pay | Admitting: Family Medicine

## 2014-09-07 ENCOUNTER — Telehealth: Payer: Self-pay | Admitting: Family Medicine

## 2014-09-07 NOTE — Telephone Encounter (Signed)
Rand??? Is not working. She has headchaes and  Please advise

## 2014-09-07 NOTE — Telephone Encounter (Signed)
Spoke with patient and she has complaints of her aciphex not working.  She is also c/o back aches when she bends down to pick up her groceries.  Appt made for 09/14/14. Alex Mcmanigal,CMA

## 2014-09-14 ENCOUNTER — Ambulatory Visit: Payer: Medicaid Other | Admitting: Family Medicine

## 2014-09-18 ENCOUNTER — Other Ambulatory Visit: Payer: Self-pay | Admitting: Family Medicine

## 2014-09-23 ENCOUNTER — Ambulatory Visit (INDEPENDENT_AMBULATORY_CARE_PROVIDER_SITE_OTHER): Payer: Medicaid Other | Admitting: Family Medicine

## 2014-09-23 ENCOUNTER — Encounter: Payer: Self-pay | Admitting: Family Medicine

## 2014-09-23 VITALS — BP 113/72 | HR 69 | Temp 97.9°F | Wt 194.0 lb

## 2014-09-23 DIAGNOSIS — R079 Chest pain, unspecified: Secondary | ICD-10-CM

## 2014-09-23 DIAGNOSIS — K219 Gastro-esophageal reflux disease without esophagitis: Secondary | ICD-10-CM

## 2014-09-23 DIAGNOSIS — R0989 Other specified symptoms and signs involving the circulatory and respiratory systems: Secondary | ICD-10-CM

## 2014-09-23 DIAGNOSIS — R6889 Other general symptoms and signs: Secondary | ICD-10-CM

## 2014-09-23 DIAGNOSIS — I208 Other forms of angina pectoris: Secondary | ICD-10-CM

## 2014-09-23 DIAGNOSIS — M545 Low back pain, unspecified: Secondary | ICD-10-CM

## 2014-09-23 DIAGNOSIS — G8929 Other chronic pain: Secondary | ICD-10-CM

## 2014-09-23 DIAGNOSIS — R198 Other specified symptoms and signs involving the digestive system and abdomen: Secondary | ICD-10-CM

## 2014-09-23 LAB — LIPID PANEL
CHOL/HDL RATIO: 3.1 ratio
CHOLESTEROL: 156 mg/dL (ref 0–200)
HDL: 50 mg/dL (ref >=46)
LDL Cholesterol: 90 mg/dL (ref 0–99)
Triglycerides: 79 mg/dL (ref <150)
VLDL: 16 mg/dL (ref 0–40)

## 2014-09-23 LAB — TSH: TSH: 1.936 u[IU]/mL (ref 0.350–4.500)

## 2014-09-23 LAB — POCT GLYCOSYLATED HEMOGLOBIN (HGB A1C): Hemoglobin A1C: 5.7

## 2014-09-23 MED ORDER — RABEPRAZOLE SODIUM 20 MG PO TBEC: DELAYED_RELEASE_TABLET | ORAL | Status: DC

## 2014-09-23 MED ORDER — ASPIRIN 81 MG PO TABS: 81.0000 mg | ORAL_TABLET | Freq: Every day | ORAL | Status: AC

## 2014-09-23 NOTE — Patient Instructions (Signed)
Nice to see you. We are going to refer you to cardiology for evaluation of your chest pain. We are going to refer you to ENT for evaluation of the sensation you have in your throat. Start taking aspirin 81 mg daily.  If you develop chest pain, shortness of breath, sweating, choking sensation, inability to swallow, worsening back pain, fever, weakness, numbness, or loss of control of bowels or bladder please seek medical attention immediately.

## 2014-09-23 NOTE — Assessment & Plan Note (Addendum)
Only symptom at this time is sensation of throat fullness. Has no issues swallowing, though feels throat fullness intermittently. Weight is stable. Throat fullness is likely related to reflux, though given this complaint would like to have ENT evaluate her to make sure that her vocal cords and throat appear normal. Will refill aciphex. Given return precautions in AVS.

## 2014-09-23 NOTE — Assessment & Plan Note (Signed)
Currently pain free. No red flags. Discussed infrequent use of NSAIDs given reflux. Will continue to follow. Given return precautions.

## 2014-09-23 NOTE — Assessment & Plan Note (Signed)
Patient with increasing frequency of chest pain only on exertion with typical features most consistent with stable angina. EKG with no changes from previous. No chest pain at this time and lack of chest pain at rest makes ACS unlikely. No recurrence since last week. Patient is on nitro patch for an undetermined reason as there is no chest pain listed in her problem list. Will check lipid panel, A1c, and TSH. Advised to take aspirin 81 mg daily. Referral placed to cardiology for consideration of stress testing. Given return precautions.

## 2014-09-23 NOTE — Progress Notes (Signed)
Patient ID: Megan Miller, female   DOB: [birth year], 59 y.o.   MRN: [id number]  Marikay AlarEric Sonnenberg, MD Phone: [phone]  Megan Miller is a 59 y.o. female who presents today for f/u.  Chest pain: patient notes last week she had an episode of chest pain while helping her friend move. She notes this came on while she was walking up stairs. She notes the pain was centrally located and a pressure. She notes some dyspnea with it. No diaphoresis or radiation. She notes the pain went away with her sitting down and lasted for 1 minute. She notes she has had this previously and was evaluated by cardiology, though she can not remember who. She notes she uses a nitro patch, though does not remember who filled this for her. Notes has this type of discomfort ~2x/month and it is always with physical activity and always goes away with rest after one minute.   Chronic back pain: pain free today. Notes typically only gets pain if she is mopping or bending over doing some typ of work. Is always low back and is an achey pain. Goes away if she rests her back. Last had pain on Monday. Denies weakness and numbness in LE. No saddle anesthesia, incontinence issues, fever, or history of cancer. Takes ibuprofen for this 1-2x/month.   GERD  Disease Monitoring Typical Symptoms:  No reflux sensation, burning, or sour taste     Food sticking:  Has sensation of throat fullness, no issues swallowing  Weight Loss:  no   Medication Monitoring Compliance:  Taking aciphex in the past, though for some reason the pharmacy gave her ranitidine this last refill     Have attempted taking only PRN no   Patient is a nonsmoker. No alcohol use.    ROS: Per HPI   Physical Exam Filed Vitals:   09/23/14 0904  BP: 113/72  Pulse: 69  Temp: 97.9 F (36.6 C)    Gen: Well NAD HEENT: PERRL,  MMM, normal OP Lungs: CTABL Nl WOB Heart: RRR, no murmur appreciated Abd: soft, NT, ND Exts: Non edematous BL  LE, warm and well  perfused.   EKG: NSR, rate 64, no ST changes, T waves inverted in V1 and V2 which is stable from last EKG, EKG appears unchanged from previous  Assessment/Plan: Please see individual problem list.  Marikay AlarEric Sonnenberg, MD [facility] PGY[facility]

## 2014-09-24 ENCOUNTER — Telehealth: Payer: Self-pay | Admitting: Family Medicine

## 2014-09-24 ENCOUNTER — Encounter: Payer: Self-pay | Admitting: Family Medicine

## 2014-09-24 ENCOUNTER — Ambulatory Visit (HOSPITAL_COMMUNITY)
Admission: RE | Admit: 2014-09-24 | Discharge: 2014-09-24 | Disposition: A | Discharge status: Home / Self Care | Payer: Medicaid Other | Source: Ambulatory Visit | Attending: Family Medicine | Admitting: Family Medicine

## 2014-09-24 DIAGNOSIS — R079 Chest pain, unspecified: Secondary | ICD-10-CM | POA: Diagnosis not present

## 2014-09-24 NOTE — Telephone Encounter (Signed)
Not sure what "leg" doctor patient is referring to.  We have only made recent appts for cardiology and ENT which are both across from our office on [address].  LM for her to call back to clarify.  Jazmin Hartsell,CMA

## 2014-09-24 NOTE — Telephone Encounter (Signed)
Pt says she was referred to a "leg doctor" on [address] but its too far from her house, wants to know if there is some where else we can refer her to?

## 2014-09-24 NOTE — Telephone Encounter (Signed)
Pt says she needs the brand name of aciphex that was called in yesterday, says MD has to call the pharmacy

## 2014-09-24 NOTE — Telephone Encounter (Signed)
LM for patient to call back.  Aciphex is the brand name.  Need clarification as to why we need to call pharmacy. Eamonn Sermeno,CMA

## 2014-09-24 NOTE — Telephone Encounter (Signed)
Patient will see any orthopedic provider she's referred to.  Don't have anyone specific in mind.

## 2014-09-28 ENCOUNTER — Telehealth: Payer: Self-pay | Admitting: Family Medicine

## 2014-09-28 ENCOUNTER — Other Ambulatory Visit: Payer: Self-pay | Admitting: Family Medicine

## 2014-09-28 DIAGNOSIS — M25569 Pain in unspecified knee: Secondary | ICD-10-CM

## 2014-09-28 NOTE — Telephone Encounter (Signed)
Will forward to MD to place a referral for ortho. Jazmin Hartsell,CMA

## 2014-09-28 NOTE — Telephone Encounter (Signed)
I need to know what she wants the referral for. I can't place a referral without a reason for the referral.

## 2014-09-28 NOTE — Telephone Encounter (Signed)
Needs referral to leg dr [facility] [facility] [facility] [place]

## 2014-09-28 NOTE — Telephone Encounter (Signed)
LM for patient to call back.  Please ask her what problem she is having with her legs so we know to send her. Jazmin Hartsell,CMA

## 2014-10-01 NOTE — Telephone Encounter (Signed)
Pt calls back, it is for Dr. Chilton SiGreen for her knee pain. Megan Miller, [name]

## 2014-10-01 NOTE — Telephone Encounter (Signed)
Referral placed.

## 2014-10-15 ENCOUNTER — Other Ambulatory Visit: Payer: Self-pay | Admitting: *Deleted

## 2014-10-15 ENCOUNTER — Ambulatory Visit: Payer: Medicaid Other | Admitting: Internal Medicine

## 2014-10-15 DIAGNOSIS — I10 Essential (primary) hypertension: Secondary | ICD-10-CM

## 2014-10-15 MED ORDER — LISINOPRIL-HYDROCHLOROTHIAZIDE 20-12.5 MG PO TABS: 1.0000 | ORAL_TABLET | Freq: Every day | ORAL | Status: DC

## 2014-10-20 ENCOUNTER — Ambulatory Visit (INDEPENDENT_AMBULATORY_CARE_PROVIDER_SITE_OTHER): Payer: Medicaid Other | Admitting: Family Medicine

## 2014-10-20 ENCOUNTER — Encounter: Payer: Self-pay | Admitting: Family Medicine

## 2014-10-20 ENCOUNTER — Ambulatory Visit
Admission: RE | Admit: 2014-10-20 | Discharge: 2014-10-20 | Disposition: A | Discharge status: Home / Self Care | Payer: Medicaid Other | Source: Ambulatory Visit | Attending: Family Medicine | Admitting: Family Medicine

## 2014-10-20 VITALS — BP 121/64 | HR 67 | Temp 97.8°F | Ht 61.0 in | Wt 196.3 lb

## 2014-10-20 DIAGNOSIS — R109 Unspecified abdominal pain: Secondary | ICD-10-CM | POA: Insufficient documentation

## 2014-10-20 DIAGNOSIS — R1084 Generalized abdominal pain: Secondary | ICD-10-CM

## 2014-10-20 NOTE — Assessment & Plan Note (Signed)
Recurrent lower abdominal pain, with abdominal fullness on exam. Will obtain abdominal x-rays to start, doesn't appear to be infectious, concern for large stool burden versus mass. I have asked her to continue the simethicone, drink plenty of water, and increase her daily fiber intake. I will call patient with the results as soon as they become available and discuss treatment plan at that time. If patient is constipated, she would benefit may be from daily mural ask use, could consider checking TSH as well.  I have encouraged her to schedule her colonoscopy, last colonoscopy was in 2006, with unknown results, pain could be derived from some diverticuli or other pathology. I will follow-up with her after her x-ray results are available.

## 2014-10-20 NOTE — Patient Instructions (Signed)
SEEK IMMEDIATE MEDICAL CARE IF:   Your pain does not go away or gets worse.  You have a fever.  Your pain is felt only in portions of the abdomen. The right side could possibly be appendicitis. The left lower portion of the abdomen could be colitis or diverticulitis.  You are passing blood in your stools (bright red or black tarry stools, with or without vomiting).  You have blood in your urine.  You develop chills, with or without a fever.  You pass out. MAKE SURE YOU:   Understand these instructions.  Will watch your condition.  Will get help right away if you are not doing well or get worse. Document Released: 05/13/2007 Document Revised: 11/30/2013 Document Reviewed: 06/02/2009 [facility] Patient Information 2015 [place]. This information is not intended to replace advice given to you by your health care provider. Make sure you discuss any questions you have with your health care provider.  Start off with getting an x-ray of your stomach, I will call you with results and treatment plan once I receive these.  Continue the simethicone, which is the medication Dr. Gwendolyn GrantWalden gave you before that helped. Make sure to eat a high-fiber diet, and drink plenty of fluids.

## 2014-10-20 NOTE — Progress Notes (Signed)
   Subjective:    Patient ID: Megan Miller, female    DOB: 1955-11-13, 59 y.o.   MRN: [id number]  HPI  Abdominal distention and pain: Patient presents to family medicine clinic today for a same-day appointment for periumbilical abdominal pain. She states this is a similar pain to what she fell in November, which she was told to take simethicone, which helped relieve some of her discomfort. She has stopped all NSAID use. She had a colonoscopy in 2006, however she does not recall the results to this in the are not in the system. She points to her periumbilical as the location of her pain. It really started about one month ago, first occurrence was in November. She feels it might be worse with eating, she hasn't associated it with any particular food. She endorses increased gas. She attempted to take Pepto-Bismol which did not help. She denies any fevers, chills, nausea, vomiting. She does admit to occasionally loose stool. Her stools have been nonbloody, unchanged in color or caliber. Her normal bowel movement pattern is 1 time a week. She has had a hysterectomy. She is uncertain if she has her ovaries.  Never smoker  Past Medical History  Diagnosis Date  . Allergic conjunctivitis   . Leg edema     Asymetric Right leg (dopplers negative)  . Chronic back pain   . Chest pain   . Constipation, chronic   . Dyspnea on exertion   . Dysthymic   . GERD (gastroesophageal reflux disease)   . Hypertension   . Overweight (BMI 25.0-29.9)   . Urination frequency    Allergies  Allergen Reactions  . Codeine Phosphate Itching    REACTION: Denies rash or allergic reaction.  Marland Kitchen. Percocet [Oxycodone-Acetaminophen] Nausea And Vomiting    Review of Systems Per HPI    Objective:   Physical Exam BP 121/64 mmHg  Pulse 67  Temp(Src) 97.8 F (36.6 C) (Oral)  Ht 5\' 1"  (1.549 m)  Wt 196 lb 4.8 oz (89.041 kg)  BMI 37.11 kg/m2 Gen: NAD. Nontoxic in appearance, pleasant African-American female, obese.  Well-developed, well-nourished. HEENT: AT. [place]. Bilateral eyes without injections or icterus. MMM.  Abd: Soft. Obese. ND. Tender lower abdomen. BS hypoactive.  Lower abdominal fullness.    Assessment & Plan:

## 2014-10-21 ENCOUNTER — Telehealth: Payer: Self-pay | Admitting: Family Medicine

## 2014-10-21 NOTE — Telephone Encounter (Signed)
Please call pt, her xray did show constipation. I seen she was prescribed miralax in the past, but did call in refills. Please encourage to use this daily as the label reads. Encourage her to schedule her screening colonoscopy if she has not done so already.  Thanks.

## 2014-10-21 NOTE — Telephone Encounter (Signed)
LVM for pt to call back to inform her of below. Zimmerman Rumple, April D  

## 2014-10-21 NOTE — Progress Notes (Signed)
I was the preceptor for this encounter. Tasharra Nodine, M.D. 

## 2014-10-25 ENCOUNTER — Telehealth: Payer: Self-pay | Admitting: Family Medicine

## 2014-10-25 NOTE — Telephone Encounter (Signed)
LVM with patient to give me more info of the orthopedic that she normally sees.

## 2014-11-02 ENCOUNTER — Telehealth: Payer: Self-pay | Admitting: Family Medicine

## 2014-11-02 NOTE — Telephone Encounter (Signed)
Will forward to medical records for review. Sallie Staron, CMA.

## 2014-11-02 NOTE — Telephone Encounter (Signed)
Pt called and needs a print off of the office visit from 2015 through present. Please call patient when she can come and pick this up. jw

## 2014-11-12 ENCOUNTER — Ambulatory Visit: Payer: Medicaid Other | Admitting: Internal Medicine

## 2014-11-15 ENCOUNTER — Ambulatory Visit (INDEPENDENT_AMBULATORY_CARE_PROVIDER_SITE_OTHER): Payer: Medicaid Other | Admitting: Cardiology

## 2014-11-15 ENCOUNTER — Telehealth: Payer: Self-pay | Admitting: Family Medicine

## 2014-11-15 ENCOUNTER — Encounter: Payer: Self-pay | Admitting: Cardiology

## 2014-11-15 VITALS — BP 120/62 | HR 78 | Ht 61.0 in | Wt 193.8 lb

## 2014-11-15 DIAGNOSIS — K219 Gastro-esophageal reflux disease without esophagitis: Secondary | ICD-10-CM

## 2014-11-15 DIAGNOSIS — I10 Essential (primary) hypertension: Secondary | ICD-10-CM

## 2014-11-15 DIAGNOSIS — R0789 Other chest pain: Secondary | ICD-10-CM | POA: Diagnosis not present

## 2014-11-15 DIAGNOSIS — R9431 Abnormal electrocardiogram [ECG] [EKG]: Secondary | ICD-10-CM

## 2014-11-15 NOTE — Progress Notes (Signed)
Cardiology Office Note   Date:  11/15/2014   ID:  Megan Miller, Megan Miller 1956/03/21, MRN [id number]  PCP:  Marikay Alar, MD  Cardiologist:   Donato Schultz, MD       History of Present Illness: Megan Miller is a 59 y.o. female who presents for evaluation of chest pain. She has in the past also had periumbilical pain and abdominal distention and has been seen in the family medicine clinic for this. Nonsmoker.  Her original bout of chest discomfort occurred when she was helping a friend move. Walking upstairs noted pain, central location as well as a pressure. Mild shortness of breath. No diaphoresis. Pain seemed to go away after sitting down and lasted for approximately 1 minute. In the distant past she was evaluated by cardiology but cannot remember exactly who performed this evaluation. She even had a nitroglycerin patch but does not remember filled this for her. Can hurt with exertion. In fact she states that when she walks up to the family practice building for her appointments sometimes she will get this discomfort. She also states that she has low back pain as well as leg pain.  Sometimes she also can get a hot flash/sweaty feeling and feels sometimes dizzy. This has happened before.  Felt hot and had to sit down.   She also has chronic back pain when mopping or bending over. T waves are inverted in V1 and V2 which is stable from prior EKG. Normal sinus rhythm. She is currently not working.  Left knee surgery in the past. This won't allow her to walk on treadmill. In 2012 she had a low risk pharmacologic stress test..     Past Medical History  Diagnosis Date  . Allergic conjunctivitis   . Leg edema     Asymetric Right leg (dopplers negative)  . Chronic back pain   . Chest pain   . Constipation, chronic   . Dyspnea on exertion   . Dysthymic   . GERD (gastroesophageal reflux disease)   . Hypertension   . Overweight (BMI 25.0-29.9)   . Urination frequency      Past Surgical History  Procedure Laterality Date  . Bilateral salpingoophorectomy  05/08/2001    Path=extensive endometriosis  . Abdominal hysterectomy  09/28/1999    for leimyomas  . Breast surgery  12/29/2003    Right breast core biopsy=benign fibrocystic change  . Small intestine surgery  03/30/2001    Rectosigmoid bowel resection and reanastomosis     Current Outpatient Prescriptions  Medication Sig Dispense Refill  . ACIPHEX 20 MG tablet TAKE 1 TABLET (20 MG TOTAL) BY MOUTH DAILY. 30 tablet 3  . aspirin 81 MG tablet Take 1 tablet (81 mg total) by mouth daily. 90 tablet 3  . carvedilol (COREG) 25 MG tablet TAKE 1 TABLET BY MOUTH TWICE A DAY 60 tablet 5  . desonide (DESOWEN) 0.05 % cream Apply topically 2 (two) times daily. 30 g 0  . ibuprofen (ADVIL,MOTRIN) 800 MG tablet Take 1 tablet (800 mg total) by mouth every 8 (eight) hours as needed. 60 tablet 2  . lisinopril-hydrochlorothiazide (PRINZIDE,ZESTORETIC) 20-12.5 MG per tablet Take 1 tablet by mouth daily. 30 tablet 11  . minoxidil (ROGAINE) 2 % external solution Apply topically 2 (two) times daily. 60 mL 0  . nitroGLYCERIN (NITRODUR - DOSED IN MG/24 HR) 0.2 mg/hr patch APPLY 1/4 PATCH TO AFFECTED AREA. CHANGE PATCH EVERY 24 HOURS. 30 patch 1  . nitroGLYCERIN (NITROSTAT) 0.4 MG SL tablet  Place 0.4 mg under the tongue every 5 (five) minutes as needed. For chest pain    . olopatadine (PATANOL) 0.1 % ophthalmic solution Place 1 drop into both eyes 2 (two) times daily. For allergy symptoms     . polyethylene glycol powder (GLYCOLAX/MIRALAX) powder TAKE 17GRAMS IN A FULL GLASS OF WATER DAILY 527 g 2  . simethicone (GAS-X) 80 MG chewable tablet Chew 1 tablet (80 mg total) by mouth every 6 (six) hours as needed for flatulence. 30 tablet 0   No current facility-administered medications for this visit.    Allergies:   Codeine phosphate and Percocet    Social History:  The patient  reports that she has never smoked. She does not  have any smokeless tobacco history on file. She reports that she does not drink alcohol or use illicit drugs.   Family History:  The patient's family history includes Asthma in her brother; Diabetes in her brother, father, and sister; Hypertension in her mother.    ROS:  Please see the history of present illness.   Otherwise, review of systems are positive for weight gain, occasional hot flashes, chills, appetite change, sweating, wheezing, leg pain, leg swelling occasionally, chest pain, shortness of breath, cough, back pain, dizziness, occasional headaches.   All other systems are reviewed and negative.    PHYSICAL EXAM: VS:  BP 120/62 mmHg  Pulse 78  Ht 5\' 1"  (1.549 m)  Wt 193 lb 12.8 oz (87.907 kg)  BMI 36.64 kg/m2 , BMI Body mass index is 36.64 kg/(m^2). GEN: Well nourished, well developed, in no acute distress HEENT: normal(mild fullness of neck soft tissue) Neck: no JVD, carotid bruits, or masses Cardiac: RRR; no murmurs, rubs, or gallops,no edema  Respiratory:  clear to auscultation bilaterally, normal work of breathing GI: soft, nontender, nondistended, + BS MS: no deformity or atrophy Skin: warm and dry, no rash Neuro:  Strength and sensation are intact Psych: euthymic mood, full affect   EKG:  EKG is not ordered today. Prior EKG shows T-wave inversion V1, V2 as was seen previously.   Recent Labs: 08/31/2014: BUN 18; Creatinine 1.12*; Potassium 3.8; Sodium 138 09/23/2014: TSH 1.936    Lipid Panel    Component Value Date/Time   CHOL 156 09/23/2014 0947   TRIG 79 09/23/2014 0947   HDL 50 09/23/2014 0947   CHOLHDL 3.1 09/23/2014 0947   VLDL 16 09/23/2014 0947   LDLCALC 90 09/23/2014 0947      Wt Readings from Last 3 Encounters:  11/15/14 193 lb 12.8 oz (87.907 kg)  10/20/14 196 lb 4.8 oz (89.041 kg)  09/23/14 194 lb (87.998 kg)      Other studies Reviewed: Additional studies/ records that were reviewed today include: Prior office records, lab work reviewed,  EKGs reviewed. Review of the above records demonstrates: As above   ASSESSMENT AND PLAN:  1.  Chest pain-has both typical as well as atypical features. We will go ahead and evaluate further or any evidence of ischemia with nuclear stress test, pharmacologic. She is unable to walk on treadmill because of prior left knee surgery. Other possibilities for chest pain include musculoskeletal or perhaps GERD. She is being treated for both. I'm fine with her having nitroglycerin on hand if necessary.  Her occasional diaphoretic/hot sensations could be vagal phenomenon. Maintain adequate hydration. If this causes dizziness, lay down and symptoms should pass  2. Essential hypertension-under good control. No changes made.  3. GERD-on medications as listed above. Proton pump inhibitor. Could be  contribute into her symptoms. Continue with therapy as driven by primary physician.   Current medicines are reviewed at length with the patient today.  The patient does not have concerns regarding medicines.  The following changes have been made:  no change  Labs/ tests ordered today include:   Orders Placed This Encounter  Procedures  . Myocardial Perfusion Imaging     Disposition:   We will follow-up with results of stress test and we will see her back on as-needed basis.   Mathews Robinsons, MD  11/15/2014 11:16 AM    [facility] [address] [place], [place], [place]  [address] Phone: [phone]; Fax: [phone]

## 2014-11-15 NOTE — Telephone Encounter (Signed)
Pt called and would like a prescription of Flonase and also sleeping pills. jw

## 2014-11-15 NOTE — Telephone Encounter (Signed)
I am covering for Dr. Birdie SonsSonnenberg who is away from the office.  Pt needs an appt to discuss these medication requests. I do not see that she has ever been prescribed flonase or sleeping pills by our clinic in the past.  Blue team, please inform patient.  Thanks! Latrelle DodrillBrittany J Cashawn Yanko, MD

## 2014-11-15 NOTE — Patient Instructions (Signed)
The current medical regimen is effective;  continue present plan and medications.  Your physician has requested that you have a lexiscan myoview. For further information please visit www.cardiosmart.org. Please follow instruction sheet, as given.  Follow up as needed after testing.  Thank you for choosing [facility]!!      

## 2014-11-15 NOTE — Telephone Encounter (Signed)
Will forward to MD.  Neither of these are on her current medication list. Burnard HawthorneJazmin Donell Sliwinski,CMA

## 2014-11-16 NOTE — Telephone Encounter (Signed)
LM for patient to call back.  She will need to schedule an appt to discuss medication. Shalisha Clausing,CMA

## 2014-11-17 ENCOUNTER — Telehealth: Payer: Self-pay | Admitting: Family Medicine

## 2014-11-17 ENCOUNTER — Telehealth: Payer: Self-pay | Admitting: Clinical

## 2014-11-17 DIAGNOSIS — R143 Flatulence: Principal | ICD-10-CM

## 2014-11-17 DIAGNOSIS — R142 Eructation: Principal | ICD-10-CM

## 2014-11-17 DIAGNOSIS — R141 Gas pain: Secondary | ICD-10-CM

## 2014-11-17 NOTE — Telephone Encounter (Signed)
Pt called CSW requesting a referral for eye glasses. CSW explored whether pt has already had a vision exam. Pt stated she had 1 last year but plans on going to [facility] this week ($3 copay with Medicaid). CSW informed pt that after she has had her eye exam she can cannot CSW to make an appointment to complete a "New Eyes" application to apply for free glasses. Pt agreeable and appreciative.  Theresia BoughNorma Ikenna Ohms, MSW, LCSW [phone]

## 2014-11-17 NOTE — Telephone Encounter (Signed)
Pt called because she had a relaxer put in her hair and now her scalp is itchy and flaky. She would like something called in for this. jw

## 2014-11-17 NOTE — Telephone Encounter (Signed)
Lm for patient to call back and please schedule a same day appt for evaluation of her scalp. Jazmin Hartsell,CMA

## 2014-11-24 ENCOUNTER — Telehealth (HOSPITAL_COMMUNITY): Payer: Self-pay | Admitting: *Deleted

## 2014-11-24 NOTE — Telephone Encounter (Signed)
Telephone call: Patient given instructions for nuclear procedure on 11/25/14. Patient understood instructions.

## 2014-11-25 ENCOUNTER — Ambulatory Visit (HOSPITAL_COMMUNITY): Payer: Medicaid Other | Attending: Cardiology | Admitting: Radiology

## 2014-11-25 ENCOUNTER — Ambulatory Visit (HOSPITAL_COMMUNITY): Payer: Medicaid Other

## 2014-11-25 DIAGNOSIS — R9431 Abnormal electrocardiogram [ECG] [EKG]: Secondary | ICD-10-CM | POA: Diagnosis present

## 2014-11-25 DIAGNOSIS — I10 Essential (primary) hypertension: Secondary | ICD-10-CM | POA: Diagnosis not present

## 2014-11-25 DIAGNOSIS — R079 Chest pain, unspecified: Secondary | ICD-10-CM | POA: Insufficient documentation

## 2014-11-25 DIAGNOSIS — R0789 Other chest pain: Secondary | ICD-10-CM

## 2014-11-25 DIAGNOSIS — R0609 Other forms of dyspnea: Secondary | ICD-10-CM | POA: Insufficient documentation

## 2014-11-25 MED ORDER — REGADENOSON 0.4 MG/5ML IV SOLN
0.4000 mg | Freq: Once | INTRAVENOUS | Status: AC
  Administered 2014-11-25: 0.4 mg via INTRAVENOUS

## 2014-11-25 MED ORDER — TECHNETIUM TC 99M SESTAMIBI GENERIC - CARDIOLITE
11.0000 | Freq: Once | INTRAVENOUS | Status: AC | PRN
  Administered 2014-11-25: 11 via INTRAVENOUS

## 2014-11-25 MED ORDER — TECHNETIUM TC 99M SESTAMIBI GENERIC - CARDIOLITE
33.0000 | Freq: Once | INTRAVENOUS | Status: AC | PRN
  Administered 2014-11-25: 33 via INTRAVENOUS

## 2014-11-25 NOTE — Progress Notes (Signed)
[facility] SITE 3 NUCLEAR MED [address] , [place] [address] [phone]    Cardiology Nuclear Med Study  Megan LloydVickie L Devore is a 59 y.o. female     MRN : [id number]     DOB: 1956/01/02  Procedure Date: 11/25/2014  Nuclear Med Background Indication for Stress Test:  Evaluation for Ischemia and Abnormal EKG History:  MPI 2012 (normal) EF 69% Cardiac Risk Factors: Hypertension  Symptoms:  Chest Pain, Chest Pain with Exertion (last date of chest discomfort was two days ago) and DOE   Nuclear Pre-Procedure Caffeine/Decaff Intake:  None NPO After: 7:00pm   Lungs:  clear O2 Sat: 97% on room air. IV 0.9% NS with Angio Cath:  22g  IV Site: R Hand  IV Started by:  Bonnita LevanJackie Smith, RN  Chest Size (in):  40 Cup Size: C  Height: 5\' 1"  (1.549 m)  Weight:  194 lb (87.998 kg)  BMI:  Body mass index is 36.67 kg/(m^2). Tech Comments:  N/A    Nuclear Med Study 1 or 2 day study: 1 day  Stress Test Type:  Lexiscan  Reading MD: N/A  Order Authorizing Provider:  Donato SchultzMark Skains, MD  Resting Radionuclide: Technetium 54100m Sestamibi  Resting Radionuclide Dose: 11.0 mCi   Stress Radionuclide:  Technetium 8100m Sestamibi  Stress Radionuclide Dose: 33.0 mCi           Stress Protocol Rest HR: 62 Stress HR: 93  Rest BP: 106/64 Stress BP: 120/49  Exercise Time (min): n/a METS: n/a   Predicted Max HR: 161 bpm % Max HR: 57.76 bpm Rate Pressure Product: [id number]   Dose of Adenosine (mg):  n/a Dose of Lexiscan: 0.4 mg  Dose of Atropine (mg): n/a Dose of Dobutamine: n/a mcg/kg/min (at max HR)  Stress Test Technologist: Nelson ChimesSharon Brooks, BS-ES  Nuclear Technologist:  Kerby NoraElzbieta Kubak, CNMT     Rest Procedure:  Myocardial perfusion imaging was performed at rest 45 minutes following the intravenous administration of Technetium 82100m Sestamibi. Rest ECG: NSR with non-specific ST-T wave changes  Stress Procedure:  The patient received IV Lexiscan 0.4 mg over 15-seconds.  Technetium 29100m Sestamibi  injected at 30-seconds.  Quantitative spect images were obtained after a 45 minute delay.  During the infusion of Lexiscan the patient complained of SOB and chest tightness.  These symptoms began to resolve in recovery.  Stress ECG: No significant change from baseline ECG  QPS Raw Data Images:  Mild diaphragmatic attenuation.  Normal left ventricular size. Stress Images:  Normal homogeneous uptake in all areas of the myocardium. Rest Images:  Normal homogeneous uptake in all areas of the myocardium. Subtraction (SDS):  No evidence of ischemia. Transient Ischemic Dilatation (Normal <1.22):  1.13 Lung/Heart Ratio (Normal <0.45):  0.35  Quantitative Gated Spect Images QGS EDV:  67 ml QGS ESV:  17 ml  Impression Exercise Capacity:  Lexiscan with no exercise. BP Response:  Normal blood pressure response. Clinical Symptoms:  Mild chest pain/dyspnea. ECG Impression:  No significant ST segment change suggestive of ischemia. Comparison with Prior Nuclear Study: No significant change from previous study  Overall Impression:  Normal stress nuclear study.  LV Ejection Fraction: 75%.  LV Wall Motion:  NL LV Function; NL Wall Motion  Signed: Armanda Magicraci Amaan Meyer, MD [facility] 11/25/2014

## 2014-11-29 ENCOUNTER — Other Ambulatory Visit: Payer: Self-pay | Admitting: *Deleted

## 2014-11-30 MED ORDER — CARVEDILOL 25 MG PO TABS: 25.0000 mg | ORAL_TABLET | Freq: Two times a day (BID) | ORAL | Status: DC

## 2014-11-30 NOTE — Telephone Encounter (Signed)
2nd request.  Bryker Fletchall L, RN  

## 2014-11-30 NOTE — Telephone Encounter (Signed)
Called patient to confirm that she has been taking this medication. Appears in Epic that it had not been refilled since a year ago, though patient reports that she has been taking this continuously over the past year. Refills sent to pharmacy.

## 2014-12-01 ENCOUNTER — Telehealth: Payer: Self-pay | Admitting: Cardiology

## 2014-12-01 NOTE — Telephone Encounter (Signed)
Left message to call back for results

## 2014-12-01 NOTE — Telephone Encounter (Signed)
Reviewed results of nuclear stress test with pt who states understanding.  She had no further questions and thanked me for my time.

## 2014-12-01 NOTE — Telephone Encounter (Signed)
New problem   Pt returning call to nurse concerning results of her stress test.

## 2014-12-06 NOTE — Telephone Encounter (Signed)
Need to have a referral to a gastroenterologist.  Please contact patient.  Need to see one for excessive flatulence.

## 2014-12-06 NOTE — Telephone Encounter (Signed)
Patient will be referred to GI at her request. Referral placed.

## 2014-12-06 NOTE — Telephone Encounter (Signed)
Spoke with patient and she is already established with [facility]  She was there in 2013 for her colonoscopy but needs a new referral for this problem.  Pt states that she has been seen here for this "gas issue" before.  Will forward to MD. Burnard HawthorneJazmin Jock Mahon,CMA

## 2014-12-08 ENCOUNTER — Telehealth: Payer: Self-pay | Admitting: Internal Medicine

## 2014-12-08 NOTE — Telephone Encounter (Signed)
Pt states she is having a lot of problems with bloating, gas and belching. States she tried gasx and it worked for only a short time. Pt scheduled to see Amy Esterwood PA 12/15/14@2pm . Pt aware of appt.

## 2014-12-09 ENCOUNTER — Encounter: Payer: Self-pay | Admitting: Clinical

## 2014-12-09 NOTE — Progress Notes (Signed)
CSW met with pt today to complete a New Eyes application for a voucher for free eye glasses. CSW has mailed off form and will be contacted if pt qualifies. Pt appreciative.  Hunt Oris, MSW, [place]

## 2014-12-15 ENCOUNTER — Encounter: Payer: Self-pay | Admitting: Physician Assistant

## 2014-12-15 ENCOUNTER — Ambulatory Visit (INDEPENDENT_AMBULATORY_CARE_PROVIDER_SITE_OTHER): Payer: Medicaid Other | Admitting: Physician Assistant

## 2014-12-15 ENCOUNTER — Other Ambulatory Visit (INDEPENDENT_AMBULATORY_CARE_PROVIDER_SITE_OTHER): Payer: Medicaid Other

## 2014-12-15 VITALS — BP 102/58 | HR 60 | Ht 61.0 in | Wt 194.5 lb

## 2014-12-15 DIAGNOSIS — R140 Abdominal distension (gaseous): Secondary | ICD-10-CM

## 2014-12-15 DIAGNOSIS — R1013 Epigastric pain: Secondary | ICD-10-CM | POA: Diagnosis not present

## 2014-12-15 LAB — COMPREHENSIVE METABOLIC PANEL
ALBUMIN: 3.9 g/dL (ref 3.5–5.2)
ALT: 17 U/L (ref 0–35)
AST: 21 U/L (ref 0–37)
Alkaline Phosphatase: 77 U/L (ref 39–117)
BUN: 14 mg/dL (ref 6–23)
CALCIUM: 9.4 mg/dL (ref 8.4–10.5)
CO2: 30 mEq/L (ref 19–32)
CREATININE: 0.98 mg/dL (ref 0.40–1.20)
Chloride: 101 mEq/L (ref 96–112)
GFR: 74.51 mL/min (ref >60.00)
GLUCOSE: 110 mg/dL — AB (ref 70–99)
POTASSIUM: 3.4 meq/L — AB (ref 3.5–5.1)
Sodium: 136 mEq/L (ref 135–145)
Total Bilirubin: 0.5 mg/dL (ref 0.2–1.2)
Total Protein: 7.9 g/dL (ref 6.0–8.3)

## 2014-12-15 LAB — CBC WITH DIFFERENTIAL/PLATELET
BASOS ABS: 0 10*3/uL (ref 0.0–0.1)
Basophils Relative: 0.5 % (ref 0.0–3.0)
EOS PCT: 1 % (ref 0.0–5.0)
Eosinophils Absolute: 0.1 10*3/uL (ref 0.0–0.7)
HEMATOCRIT: 35.3 % — AB (ref 36.0–46.0)
HEMOGLOBIN: 11.9 g/dL — AB (ref 12.0–15.0)
Lymphocytes Relative: 38.2 % (ref 12.0–46.0)
Lymphs Abs: 2.6 10*3/uL (ref 0.7–4.0)
MCHC: 33.6 g/dL (ref 30.0–36.0)
MCV: 83.5 fl (ref 78.0–100.0)
MONOS PCT: 7.5 % (ref 3.0–12.0)
Monocytes Absolute: 0.5 10*3/uL (ref 0.1–1.0)
NEUTROS PCT: 52.8 % (ref 43.0–77.0)
Neutro Abs: 3.6 10*3/uL (ref 1.4–7.7)
PLATELETS: 284 10*3/uL (ref 150.0–400.0)
RBC: 4.22 Mil/uL (ref 3.87–5.11)
RDW: 14 % (ref 11.5–15.5)
WBC: 6.8 10*3/uL (ref 4.0–10.5)

## 2014-12-15 LAB — LIPASE: LIPASE: 16 U/L (ref 11.0–59.0)

## 2014-12-15 MED ORDER — PANTOPRAZOLE SODIUM 40 MG PO TBEC: DELAYED_RELEASE_TABLET | ORAL | Status: DC

## 2014-12-15 NOTE — Patient Instructions (Signed)
Please go to the basement level to have your labs drawn.  You have been scheduled for an abdominal ultrasound at [facility] (1st floor of hospital) on Monday 12-20-2014 at 9:00 am . Please arrive  At 8:45 am prior to your appointment for registration. Make certain not to have anything to eat or drink 6 hours prior to your appointment. Should you need to reschedule your appointment, please contact radiology at [phone]. This test typically takes about 30 minutes to perform.  We sent Protonix 40 mg, 1 tab every morning, [facility] [address]

## 2014-12-15 NOTE — Progress Notes (Addendum)
Patient ID: Megan Miller, female   DOB: May 17, 1956, 59 y.o.   MRN: [id number]   Subjective:    Patient ID: Megan LloydVickie L Boreman, female    DOB: May 17, 1956, 59 y.o.   MRN: [id number]  HPI Larene BeachVickie is a 59 year old female [facility]Dr. Birdie SonsSonnenberg for evaluation of epigastric pain and bloating. Patient states that she has been having pain over the past 3-4 months and that it comes and goes. She relates episodes of pain which may last for several hours with sharp epigastric pain which is nonradiating. She does not have any associated nausea vomiting. She may go multiple days in between these episodes and has no symptoms in between. Her appetite is been fine her weight has been stable. No fevers or chills. She does have occasional heartburn no dysphagia or odynophagia. She has tried Gas-X without any benefit. She does take a baby aspirin every day and denies any NSAID use.  Only abdominal surgery was a C-section. She has not had any recent labs or imaging. Family history negative for colon cancer. Patient relates she did have a [facility] and this was negative by her report. Other medical problems include obesity, hypertension, depression.  Review of Systems Pertinent positive and negative review of systems were noted in the above HPI section.  All other review of systems was otherwise negative.  Outpatient Encounter Prescriptions as of 12/15/2014  Medication Sig  . ACIPHEX 20 MG tablet TAKE 1 TABLET (20 MG TOTAL) BY MOUTH DAILY.  Marland Kitchen. aspirin 81 MG tablet Take 1 tablet (81 mg total) by mouth daily.  . carvedilol (COREG) 25 MG tablet Take 1 tablet (25 mg total) by mouth 2 (two) times daily.  Marland Kitchen. desonide (DESOWEN) 0.05 % cream Apply topically 2 (two) times daily.  Marland Kitchen. ibuprofen (ADVIL,MOTRIN) 800 MG tablet Take 1 tablet (800 mg total) by mouth every 8 (eight) hours as needed.  Marland Kitchen. lisinopril-hydrochlorothiazide (PRINZIDE,ZESTORETIC) 20-12.5 MG  per tablet Take 1 tablet by mouth daily.  . minoxidil (ROGAINE) 2 % external solution Apply topically 2 (two) times daily.  . nitroGLYCERIN (NITRODUR - DOSED IN MG/24 HR) 0.2 mg/hr patch APPLY 1/4 PATCH TO AFFECTED AREA. CHANGE PATCH EVERY 24 HOURS.  Marland Kitchen. olopatadine (PATANOL) 0.1 % ophthalmic solution Place 1 drop into both eyes 2 (two) times daily. For allergy symptoms   . PAZEO 0.7 % SOLN Place into the right eye daily.   . polyethylene glycol powder (GLYCOLAX/MIRALAX) powder TAKE 17GRAMS IN A FULL GLASS OF WATER DAILY  . RESTASIS 0.05 % ophthalmic emulsion Place 1 drop into the right eye daily.   . simethicone (GAS-X) 80 MG chewable tablet Chew 1 tablet (80 mg total) by mouth every 6 (six) hours as needed for flatulence.  . [DISCONTINUED] nitroGLYCERIN (NITROSTAT) 0.4 MG SL tablet Place 0.4 mg under the tongue every 5 (five) minutes as needed. For chest pain  . pantoprazole (PROTONIX) 40 MG tablet Take 1 tab every morning.   No facility-administered encounter medications on file as of 12/15/2014.   Allergies  Allergen Reactions  . Codeine Phosphate Itching    REACTION: Denies rash or allergic reaction.  Marland Kitchen. Percocet [Oxycodone-Acetaminophen] Nausea And Vomiting   Patient Active Problem List   Diagnosis Date Noted  . Abdominal pain 10/20/2014  . Stable angina 09/23/2014  . Flatulence, eructation and gas pain 06/08/2014  . Cough 04/24/2014  . Muscle twitching 03/22/2014  . Difficulty sleeping 03/22/2014  . Hot flashes 11/27/2013  .  Hair loss 09/10/2013  . Right shoulder pain 03/18/2013  . Depression 03/18/2013  . Rash of face 08/28/2012  . Dysuria 06/24/2012  . Left knee pain 08/19/2011  . 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 LOW BACK PAIN, ACUTE 08/25/2009  . OVERWEIGHT 05/05/2009  . CONSTIPATION, CHRONIC 02/10/2009  . DYSTHYMIC DISORDER 02/06/2008  . GERD 02/06/2008  . HYPERTENSION, BENIGN 10/09/2006  . Chronic low back pain 10/09/2006   History   Social History  . Marital Status:  Divorced    Spouse Name: N/A  . Number of Children: 0  . Years of Education: N/A   Occupational History  . Disability    Social History Main Topics  . Smoking status: Never Smoker   . Smokeless tobacco: Never Used  . Alcohol Use: No  . Drug Use: No  . Sexual Activity: No   Other Topics Concern  . Not on file   Social History Narrative    Ms. Fontanella's family history includes Asthma in her brother; Diabetes in her brother, father, and sister; Hypertension in her mother; Kidney disease in her brother. There is no history of Colon cancer, Colon polyps, or Esophageal cancer.      Objective:    Filed Vitals:   12/15/14 1413  BP: 102/58  Pulse: 60    Physical Exam   well-developed African-American female in no acute distress, pleasant blood pressure 102/58 pulse 60 height 5 foot 1 weight 194, BMI 36.7. HEENT; nontraumatic normocephalic EOMI PERRLA sclera anicteric, Supple; no JVD, Cardiovascular; regular rate and rhythm with S1-S2 no murmur or gallop, Pulmonary; clear bilaterally, Abdomen; obese soft nontender nondistended bowel sounds are active there is no palpable mass or hepatosplenomegaly, Rectal; exam not done, Ext; no clubbing cyanosis or edema skin warm and dry, Psych; mood and affect appropriate       Assessment & Plan:   #1  59 yo female with 3-4 month hx of episodic epigastric pain/nonradiating R/O bilary colic, R/o gastropathy #2 colon screening - pt had Colonoscopy in 2013/[facility] #3 HTN #4 Obesity  Plan; CBC, CMET Schedule Upper abdominal US  Start empiric protonix 40 mg po qam Will obtain records from prior colonoscopy If labs and US unrevealing will likley need EGD with Dr . Rhea BeltonPyrtle  Addendum-[facility] received [facility]- Colon Jan 2013- left colon diverticulum, internal hemorrhoids, Colon 2006 Dr. Ewing SchleinMagod - prior low ant resection presumably secondary to endometriosis, external hemorrhoids otherwise negative- will be scanned  Amy S Esterwood  PA-C 12/15/2014   Cc: Glori LuisSonnenberg, Eric G, MD  Addendum: Reviewed and agree with initial management. Beverley FiedlerJay M Pyrtle, MD

## 2014-12-16 ENCOUNTER — Telehealth: Payer: Self-pay | Admitting: *Deleted

## 2014-12-16 ENCOUNTER — Telehealth: Payer: Self-pay | Admitting: Family Medicine

## 2014-12-16 NOTE — Telephone Encounter (Signed)
LM for the patient to please call me back. I am requesting she sign a release of information form regarding a colonoscopy she had with [facility].

## 2014-12-16 NOTE — Telephone Encounter (Signed)
No documentation needed, closing encounter / Megan BasemanSadie Miller, ASA

## 2014-12-17 ENCOUNTER — Other Ambulatory Visit: Payer: Self-pay

## 2014-12-17 MED ORDER — POTASSIUM CHLORIDE ER 20 MEQ PO TBCR: 20.0000 meq | EXTENDED_RELEASE_TABLET | Freq: Two times a day (BID) | ORAL | Status: DC

## 2014-12-20 ENCOUNTER — Ambulatory Visit (HOSPITAL_COMMUNITY): Admission: RE | Admit: 2014-12-20 | Payer: Medicaid Other | Source: Ambulatory Visit

## 2014-12-20 ENCOUNTER — Telehealth: Payer: Self-pay

## 2014-12-20 ENCOUNTER — Telehealth (HOSPITAL_COMMUNITY): Payer: Self-pay

## 2014-12-20 NOTE — Telephone Encounter (Signed)
:  30 am. Npo after midnight  had called the patient to tell her about her lab results. Patient mentions that she didn't go for her test, but would be interested in getting it rescheduled. Called to scheduling.She is scheduled for an u/s of the abdomen. The patient's test is at 9:00 (present time is 8:50). [facility] 12/21/14 at midnight. Patient notified of the new appoinment.

## 2014-12-20 NOTE — Telephone Encounter (Signed)
Called pt to remind her of her appt at 930am. Pt was unavailable..left voicemail. AW

## 2014-12-21 ENCOUNTER — Ambulatory Visit (HOSPITAL_COMMUNITY)
Admission: RE | Admit: 2014-12-21 | Discharge: 2014-12-21 | Disposition: A | Discharge status: Home / Self Care | Payer: Medicaid Other | Source: Ambulatory Visit | Attending: Physician Assistant | Admitting: Physician Assistant

## 2014-12-21 DIAGNOSIS — R1013 Epigastric pain: Secondary | ICD-10-CM

## 2014-12-21 DIAGNOSIS — R109 Unspecified abdominal pain: Secondary | ICD-10-CM | POA: Diagnosis present

## 2014-12-21 DIAGNOSIS — R140 Abdominal distension (gaseous): Secondary | ICD-10-CM | POA: Diagnosis not present

## 2014-12-21 NOTE — Telephone Encounter (Signed)
LM for the patient to please call me back. I need her to come in to sign a release of information form.

## 2014-12-22 ENCOUNTER — Other Ambulatory Visit: Payer: Self-pay

## 2014-12-22 DIAGNOSIS — R932 Abnormal findings on diagnostic imaging of liver and biliary tract: Secondary | ICD-10-CM

## 2014-12-22 DIAGNOSIS — R109 Unspecified abdominal pain: Secondary | ICD-10-CM

## 2014-12-23 ENCOUNTER — Other Ambulatory Visit: Payer: Self-pay

## 2014-12-23 DIAGNOSIS — R932 Abnormal findings on diagnostic imaging of liver and biliary tract: Secondary | ICD-10-CM

## 2014-12-23 DIAGNOSIS — R109 Unspecified abdominal pain: Secondary | ICD-10-CM

## 2014-12-23 NOTE — Telephone Encounter (Signed)
Megan Miller called back and will come to our lab for her lab tests then she will come to the 3rd floor to sign the release. I told her to ask for Eshal Propps.

## 2014-12-23 NOTE — Telephone Encounter (Signed)
Called again and got a message that I could not leave a message at this time.  I called and LM for husband on his number.

## 2014-12-24 ENCOUNTER — Other Ambulatory Visit (INDEPENDENT_AMBULATORY_CARE_PROVIDER_SITE_OTHER): Payer: Medicaid Other

## 2014-12-24 DIAGNOSIS — R932 Abnormal findings on diagnostic imaging of liver and biliary tract: Secondary | ICD-10-CM

## 2014-12-24 DIAGNOSIS — R109 Unspecified abdominal pain: Secondary | ICD-10-CM

## 2014-12-24 LAB — PROTIME-INR
INR: 1 ratio (ref 0.8–1.0)
PROTHROMBIN TIME: 11.2 s (ref 9.6–13.1)

## 2014-12-24 LAB — FERRITIN: FERRITIN: 106.2 ng/mL (ref 10.0–291.0)

## 2014-12-24 LAB — HEPATITIS B SURFACE ANTIGEN: Hepatitis B Surface Ag: NEGATIVE

## 2014-12-24 LAB — HEPATITIS B SURFACE ANTIBODY,QUALITATIVE: Hep B S Ab: NEGATIVE

## 2014-12-24 LAB — HEPATITIS C ANTIBODY: HCV Ab: NEGATIVE

## 2014-12-28 LAB — ALPHA-1-ANTITRYPSIN: A-1 Antitrypsin, Ser: 157 mg/dL (ref 83–199)

## 2014-12-28 LAB — CERULOPLASMIN: CERULOPLASMIN: 43 mg/dL (ref 18–53)

## 2014-12-28 LAB — ANA: Anti Nuclear Antibody(ANA): NEGATIVE

## 2014-12-28 LAB — ANTI-SMOOTH MUSCLE ANTIBODY, IGG: SMOOTH MUSCLE AB: 9 U (ref <20)

## 2014-12-30 ENCOUNTER — Ambulatory Visit (INDEPENDENT_AMBULATORY_CARE_PROVIDER_SITE_OTHER): Payer: Medicaid Other | Admitting: Family Medicine

## 2014-12-30 ENCOUNTER — Encounter: Payer: Self-pay | Admitting: Family Medicine

## 2014-12-30 VITALS — BP 121/57 | HR 84 | Temp 99.0°F | Ht 61.0 in | Wt 191.0 lb

## 2014-12-30 DIAGNOSIS — K219 Gastro-esophageal reflux disease without esophagitis: Secondary | ICD-10-CM

## 2014-12-30 DIAGNOSIS — L304 Erythema intertrigo: Secondary | ICD-10-CM

## 2014-12-30 MED ORDER — NYSTATIN 100000 UNIT/GM EX CREA: 1.0000 "application " | TOPICAL_CREAM | Freq: Two times a day (BID) | CUTANEOUS | Status: DC

## 2014-12-30 MED ORDER — CETIRIZINE HCL 10 MG PO TABS: 10.0000 mg | ORAL_TABLET | Freq: Every day | ORAL | Status: DC

## 2014-12-30 NOTE — Progress Notes (Signed)
Patient ID: Megan LloydVickie L Hope, female   DOB: [birth year], 59 y.o.   MRN: [id number]  Marikay AlarEric Eloyse Causey, MD Phone: [phone]  Megan Miller is a 59 y.o. female who presents today for f/u.  Patient notes itching in her axillae and under her breasts for the past month. Has not noted any rash. No fevers. Just itches. Has tried calamine with no benefit. No changes to soaps or detergents. No contacts with itching or rash. No exposure to bed bugs. Notes she has sweating and dampness in the areas of the itching. No new deodorants or bras.   Patient also notes that she has seen GI and they found possible cirrhosis on her US abd. She additionally notes that she continues to have reflux and feels as though it is difficult to swallow. Denies food getting stuck. Was seen by ENT for this and noted to have reflux findings. She was switched to protonix by GI and is to follow-up with them next week. No blood in stool. No abdominal pain.   PMH: nonsmoker.   ROS: Per HPI   Physical Exam Filed Vitals:   12/30/14 1426  BP: 121/57  Pulse: 84  Temp: 99 F (37.2 C)    Gen: Well NAD HEENT: PERRL,  MMM Lungs: CTABL Nl WOB Heart: RRR, no murmur appreciated  Abd: soft, NT, ND Skin: bilateral axillae and under breasts with moistness and mild erythematous rash with no satellite lesions noted, no induration or fluctuance, no tenderness to rash, no other rash noted Exts: Non edematous BL  LE, warm and well perfused.    Assessment/Plan: Please see individual problem list.  Marikay AlarEric Xena Propst, MD [facility] PGY-3

## 2014-12-30 NOTE — Patient Instructions (Signed)
Nice to see you. We are going to treat you for intertrigo (fungal infection under breasts). If this does not begin to improve with the topical treatment by early next week please let us know. If you develop fever, abdominal pain, issues swallowing, blood in stool or vomit please seek medical attention.

## 2014-12-30 NOTE — Assessment & Plan Note (Signed)
Continued reflux symptoms. No weight loss or report of blood in stool. Will continue protonix at this time. She will keep follow-up with GI next week for continued evaluation. Given return precautions.

## 2014-12-30 NOTE — Assessment & Plan Note (Addendum)
Location of rash most consistent with intertrigo, though no satellite lesions makes this less classic for candidal infection. Does not appear consistent with cellulitis or contact dermatitis. No fever and patient is well appearing with stable vitals. Will give trial of nystatin for these areas to treat for candidal infection. Zyrtec for itching. If not improving with nystatin would consider topical steroid for treatment. Given return precautions.

## 2014-12-31 NOTE — Progress Notes (Signed)
I was preceptor the day of this visit.   

## 2015-01-04 ENCOUNTER — Telehealth: Payer: Self-pay | Admitting: Family Medicine

## 2015-01-04 MED ORDER — NYSTATIN 100000 UNIT/GM EX CREA: 1.0000 "application " | TOPICAL_CREAM | Freq: Two times a day (BID) | CUTANEOUS | Status: DC

## 2015-01-04 MED ORDER — CETIRIZINE HCL 10 MG PO TABS: 10.0000 mg | ORAL_TABLET | Freq: Every day | ORAL | Status: DC

## 2015-01-04 NOTE — Telephone Encounter (Signed)
Pharmacy did not received Rx for Nystatin and Cetirizine.  Transmission failed through [facility].  Medications resent to [facility].  Clovis PuMartin, Victory Dresden L, RN

## 2015-01-04 NOTE — Telephone Encounter (Signed)
Meds given at last weeks visit did not go thru: Cetirizine Nystatin [facility]

## 2015-01-06 ENCOUNTER — Ambulatory Visit (AMBULATORY_SURGERY_CENTER): Payer: Self-pay

## 2015-01-06 VITALS — Ht 61.0 in | Wt 193.6 lb

## 2015-01-06 DIAGNOSIS — R1013 Epigastric pain: Secondary | ICD-10-CM

## 2015-01-06 NOTE — Progress Notes (Signed)
No allergies to eggs or soy No diet/weight loss meds No home oxygen No past problems with anesthesia Has email.  Emmi instructions given for EGD 

## 2015-01-10 ENCOUNTER — Encounter: Payer: Medicaid Other | Admitting: Internal Medicine

## 2015-01-12 ENCOUNTER — Encounter: Payer: Self-pay | Admitting: Internal Medicine

## 2015-01-12 ENCOUNTER — Ambulatory Visit (AMBULATORY_SURGERY_CENTER): Payer: Medicaid Other | Admitting: Internal Medicine

## 2015-01-12 VITALS — BP 132/79 | HR 74 | Temp 97.8°F | Resp 19 | Ht 61.0 in | Wt 193.0 lb

## 2015-01-12 DIAGNOSIS — R1013 Epigastric pain: Secondary | ICD-10-CM | POA: Diagnosis not present

## 2015-01-12 DIAGNOSIS — K2950 Unspecified chronic gastritis without bleeding: Secondary | ICD-10-CM | POA: Diagnosis not present

## 2015-01-12 MED ORDER — SODIUM CHLORIDE 0.9 % IV SOLN: 500.0000 mL | INTRAVENOUS | Status: DC

## 2015-01-12 NOTE — Op Note (Signed)
[facility] [address] [place], [address]   ENDOSCOPY PROCEDURE REPORT  PATIENT: Megan Miller, Megan Miller  MR#: [id number] BIRTHDATE: 25-Feb-1956 , 58  yrs. old GENDER: female ENDOSCOPIST: Beverley Fiedler, MD REFERRED BY:   Marikay Alar, MD PROCEDURE DATE:  01/12/2015 PROCEDURE:  EGD w/ biopsy for H.pylori ASA CLASS:     Class II INDICATIONS:  epigastric pain. MEDICATIONS: Monitored anesthesia care and Propofol 200 mg IV TOPICAL ANESTHETIC: none  DESCRIPTION OF PROCEDURE: After the risks benefits and alternatives of the procedure were thoroughly explained, informed consent was obtained.  The LB [id number] endoscope was introduced through the mouth and advanced to the second portion of the duodenum , Without limitations.  The instrument was slowly withdrawn as the mucosa was fully examined.    EXAM: The esophagus and gastroesophageal junction were completely normal in appearance.  The stomach was entered and closely examined.The antrum, angularis, and lesser curvature were well visualized, including a retroflexed view of the cardia and fundus. Small hiatal hernia.  The stomach wall was normally distensible. The scope passed easily through the pylorus into the duodenum. Cold forcep biopsies were taken from the gastric body, antrum and angularis to evaluate for h.  pylori.  Retroflexed views revealed a hiatal hernia.     The scope was then withdrawn from the patient and the procedure completed.  COMPLICATIONS: There were no immediate complications.  ENDOSCOPIC IMPRESSION: Normal appearing esophagus and GE junction, the stomach was well visualized and normal in appearance, normal appearing duodenum  RECOMMENDATIONS: 1.  Await biopsy results 2.  If biopsy results unrevealing and epigastric pain persists consider HIDA scan with CCK  eSigned:  Beverley Fiedler, MD 01/12/2015 4:17 PM    CC: the patient, Marikay Alar, MD

## 2015-01-12 NOTE — Patient Instructions (Signed)
YOU HAD AN ENDOSCOPIC PROCEDURE TODAY AT THE [facility]:   Refer to the procedure report that was given to you for any specific questions about what was found during the examination.  If the procedure report does not answer your questions, please call your gastroenterologist to clarify.  If you requested that your care partner not be given the details of your procedure findings, then the procedure report has been included in a sealed envelope for you to review at your convenience later.  YOU SHOULD EXPECT: Some feelings of bloating in the abdomen. Passage of more gas than usual.  Walking can help get rid of the air that was put into your GI tract during the procedure and reduce the bloating. If you had a lower endoscopy (such as a colonoscopy or flexible sigmoidoscopy) you may notice spotting of blood in your stool or on the toilet paper. If you underwent a bowel prep for your procedure, you may not have a normal bowel movement for a few days.  Please Note:  You might notice some irritation and congestion in your nose or some drainage.  This is from the oxygen used during your procedure.  There is no need for concern and it should clear up in a day or so.  SYMPTOMS TO REPORT IMMEDIATELY:   Following upper endoscopy (EGD)  Vomiting of blood or coffee ground material  New chest pain or pain under the shoulder blades  Painful or persistently difficult swallowing  New shortness of breath  Fever of 100F or higher  Black, tarry-looking stools  For urgent or emergent issues, a gastroenterologist can be reached at any hour by calling [phone].   DIET: Your first meal following the procedure should be a small meal and then it is ok to progress to your normal diet. Heavy or fried foods are harder to digest and may make you feel nauseous or bloated.  Likewise, meals heavy in dairy and vegetables can increase bloating.  Drink plenty of fluids but you should avoid alcoholic beverages for  24 hours.  ACTIVITY:  You should plan to take it easy for the rest of today and you should NOT DRIVE or use heavy machinery until tomorrow (because of the sedation medicines used during the test).    FOLLOW UP: Our staff will call the number listed on your records the next business day following your procedure to check on you and address any questions or concerns that you may have regarding the information given to you following your procedure. If we do not reach you, we will leave a message.  However, if you are feeling well and you are not experiencing any problems, there is no need to return our call.  We will assume that you have returned to your regular daily activities without incident.  If any biopsies were taken you will be contacted by phone or by letter within the next 1-3 weeks.  Please call us at [phone] if you have not heard about the biopsies in 3 weeks.    SIGNATURES/CONFIDENTIALITY: You and/or your care partner have signed paperwork which will be entered into your electronic medical record.  These signatures attest to the fact that that the information above on your After Visit Summary has been reviewed and is understood.  Full responsibility of the confidentiality of this discharge information lies with you and/or your care-partner.  Await biopsy results. 

## 2015-01-12 NOTE — Progress Notes (Signed)
Report to PACU, RN, vss, BBS= Clear.  

## 2015-01-12 NOTE — Progress Notes (Signed)
Called to room to assist during endoscopic procedure.  Patient ID and intended procedure confirmed with present staff. Received instructions for my participation in the procedure from the performing physician.  

## 2015-01-13 ENCOUNTER — Telehealth: Payer: Self-pay | Admitting: *Deleted

## 2015-01-13 NOTE — Telephone Encounter (Signed)
Left message that we called for f/u 

## 2015-01-20 ENCOUNTER — Encounter: Payer: Self-pay | Admitting: Internal Medicine

## 2015-01-25 ENCOUNTER — Ambulatory Visit (INDEPENDENT_AMBULATORY_CARE_PROVIDER_SITE_OTHER): Payer: Medicaid Other | Admitting: Family Medicine

## 2015-01-25 ENCOUNTER — Encounter: Payer: Self-pay | Admitting: Family Medicine

## 2015-01-25 VITALS — BP 124/59 | HR 77 | Temp 98.1°F | Ht 61.0 in | Wt 195.2 lb

## 2015-01-25 DIAGNOSIS — L608 Other nail disorders: Secondary | ICD-10-CM

## 2015-01-25 DIAGNOSIS — R600 Localized edema: Secondary | ICD-10-CM

## 2015-01-25 DIAGNOSIS — L609 Nail disorder, unspecified: Secondary | ICD-10-CM

## 2015-01-25 DIAGNOSIS — F329 Major depressive disorder, single episode, unspecified: Secondary | ICD-10-CM

## 2015-01-25 DIAGNOSIS — F32A Depression, unspecified: Secondary | ICD-10-CM

## 2015-01-25 DIAGNOSIS — I208 Other forms of angina pectoris: Secondary | ICD-10-CM

## 2015-01-25 MED ORDER — SERTRALINE HCL 50 MG PO TABS: 50.0000 mg | ORAL_TABLET | Freq: Every day | ORAL | Status: DC

## 2015-01-25 NOTE — Patient Instructions (Signed)
Nice to see you. We will start you on zoloft for depression.  If you feel as though you are going to hurt yourself or other please seek medical attention.  If the black line on your nails becomes larger please let us know.  If you develop chest pain, shortness of breath, increased swelling in your ankle, or light headedness please seek medical attention.

## 2015-01-26 ENCOUNTER — Telehealth: Payer: Self-pay | Admitting: Internal Medicine

## 2015-01-26 NOTE — Telephone Encounter (Signed)
See result note.  

## 2015-01-27 DIAGNOSIS — L608 Other nail disorders: Secondary | ICD-10-CM | POA: Insufficient documentation

## 2015-01-27 DIAGNOSIS — R600 Localized edema: Secondary | ICD-10-CM | POA: Insufficient documentation
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
## 2015-01-27 NOTE — Assessment & Plan Note (Signed)
R>L ankle edema. Likely arthritic swelling vs venous insufficiency. Recent stress test with EF 69% making CHF unlikely. Unlikely related to DVT given equal calf size and no prior history. Recent lab work with no renal, hepatic, or anemic causes apparent. Given stability over the past year will continue to monitor this and if worsens patient is to follow-up. Given return precautions.

## 2015-01-27 NOTE — Assessment & Plan Note (Addendum)
Lesion on left thumb nail likely benign melanonychia based on appearance. Unlikely to be a melanoma given single narrow line. Advised on moisture for nails and wearing gloves with cleaning. Advised to follow up if the nail lesion changes at all. Discussed with Dr McDiarmid.

## 2015-01-27 NOTE — Assessment & Plan Note (Addendum)
Recently evaluated by cardiology with normal stress test. Has mild dyspnea on exertion that is likely related to deconditioning. Unlikely cardiac with EF 69% on stress test. Discussed further work up of this with PFTs vs further imaging to rule out other causes and patient declined this at this time opting to monitor this issue. Gave return precautions.

## 2015-02-03 ENCOUNTER — Telehealth: Payer: Self-pay | Admitting: Family Medicine

## 2015-02-03 ENCOUNTER — Other Ambulatory Visit: Payer: Self-pay | Admitting: *Deleted

## 2015-02-03 NOTE — Telephone Encounter (Signed)
Will forward to RN team. Jazmin Hartsell,CMA  

## 2015-02-03 NOTE — Telephone Encounter (Signed)
Pt stated she was told by [facility] that she has a "liver problem" but was not sure what the problem was.  Pt wanted to know if the medication she is taking for depression is safe to take. Will forward to PCP.  Clovis PuMartin, Tamika L, RN

## 2015-02-03 NOTE — Telephone Encounter (Signed)
Pt called and would like to speak to a nurse about her medication she is taking for depression. She has a liver issues and wants to know can she still take this. jw

## 2015-02-04 MED ORDER — CARVEDILOL 25 MG PO TABS: 25.0000 mg | ORAL_TABLET | Freq: Two times a day (BID) | ORAL | Status: DC

## 2015-02-07 NOTE — Telephone Encounter (Signed)
Left voice message informing patient that all medications are safe to take.  Pt to continue taking medications as prescribed.  Clovis PuMartin, Amonie Wisser L, RN

## 2015-02-07 NOTE — Telephone Encounter (Signed)
Medications reviewed, and labs reviewed. She is safe to take all of her medications. Please call and tell her this. Thanks  CGM MD

## 2015-02-08 ENCOUNTER — Encounter: Payer: Self-pay | Admitting: Clinical

## 2015-02-08 NOTE — Progress Notes (Signed)
CSW mailed off [employer] - eye glass voucher form 2 months ago. CSW contacted the company to check the status. CSW informed that they no longer receive paper applications and application sent on 11/2014 was not received; CSW was not notified of this. CSW has resent an [employer] application and will notify pt when voucher is received.  Theresia BoughNorma Reinhardt Licausi, MSW, LCSW [phone]

## 2015-02-24 ENCOUNTER — Encounter: Payer: Self-pay | Admitting: Internal Medicine

## 2015-02-24 ENCOUNTER — Ambulatory Visit (INDEPENDENT_AMBULATORY_CARE_PROVIDER_SITE_OTHER): Payer: Medicaid Other | Admitting: Internal Medicine

## 2015-02-24 VITALS — BP 116/78 | HR 68 | Ht 61.42 in | Wt 190.2 lb

## 2015-02-24 DIAGNOSIS — K219 Gastro-esophageal reflux disease without esophagitis: Secondary | ICD-10-CM | POA: Diagnosis not present

## 2015-02-24 DIAGNOSIS — R932 Abnormal findings on diagnostic imaging of liver and biliary tract: Secondary | ICD-10-CM | POA: Diagnosis not present

## 2015-02-24 DIAGNOSIS — R1013 Epigastric pain: Secondary | ICD-10-CM | POA: Diagnosis not present

## 2015-02-24 DIAGNOSIS — K5900 Constipation, unspecified: Secondary | ICD-10-CM

## 2015-02-24 MED ORDER — POLYETHYLENE GLYCOL 3350 17 GM/SCOOP PO POWD: ORAL | Status: DC

## 2015-02-24 MED ORDER — RABEPRAZOLE SODIUM 20 MG PO TBEC: DELAYED_RELEASE_TABLET | ORAL | Status: DC

## 2015-02-24 NOTE — Patient Instructions (Addendum)
We have sent the following medications to your pharmacy for you to pick up at your convenience: Aciphex 20 mg daily Miralax 17 grams (1 capful) dissolved in at least 8 ounces water/juice and drink daily  You have been scheduled for an liver ultrasound with elastography at [facility] (1st floor of hospital) on 06/08/15 at 8:00 am. Please arrive 15 minutes prior to your appointment for registration. Make certain not to have anything to eat or drink 8 hours prior to your appointment. Should you need to reschedule your appointment, please contact radiology at [phone]. This test typically takes about 30 minutes to perform.  Please follow up with Dr Rhea Belton after your elastography in November.  CC:Dr Melancon

## 2015-02-24 NOTE — Progress Notes (Signed)
Subjective:    Patient ID: Megan Miller, female    DOB: [birth year], 59 y.o.   MRN: [id number]  HPI Megan Miller is a 59 year old female with a history of GERD, dyspepsia, epigastric pain and bloating who seen for follow-up. She was seen by Mike Gip, PA-C on 12/15/2014. After this visit ultrasound was performed to evaluate upper abdominal discomfort and EGD was scheduled. Abdominal ultrasound showed coarse hepatic echotexture with mildly nodular hepatic contour raising the possibility of cirrhosis. It was otherwise negative. Subsequent laboratory analysis showed normal smooth muscle antibody normal ferritin, negative ANA, normal ceruloplasmin, negative viral hepatitis serologies, normal INR. Platelets have been normal. Upper endoscopy performed on 01/12/2015 was normal. Biopsies were performed of the stomach which showed mild chronic gastritis negative for H. pylori. There is no dysplasia or malignancy. PPI was prescribed after visit with Amy.  Been taking AcipHex 20 mg daily. She reports this is helped tremendously. She has much less epigastric pain and no heartburn. No trouble swallowing. Denies nausea or vomiting or reports good appetite. She is having on and off issues with constipation. MiraLAX is helped but she is not using it daily. Without it she goes to the bathroom every 2-3 days and feels constipation. No rectal bleeding or melena. She previously had a screening colonoscopy on 08/23/2011 by Dr. Ewing Schlein. US revealed diverticulosis, functional and end colocolonic anastomosis in internal and external hemorrhoids. 10 year screening colonoscopy was recommended   Review of Systems As per history of present illness, otherwise negative  Current Medications, Allergies, Past Medical History, Past Surgical History, Family History and Social History were reviewed in [employer] record.     Objective:   Physical Exam BP 116/78 mmHg  Pulse 68  Ht 5' 1.42" (1.56  m)  Wt 190 lb 4 oz (86.297 kg)  BMI 35.46 kg/m2 Constitutional: Well-developed and well-nourished. No distress. HEENT: Normocephalic and atraumatic. Oropharynx is clear and moist. No oropharyngeal exudate. Conjunctivae are normal.  No scleral icterus. Neck: Neck supple. Trachea midline. Cardiovascular: Normal rate, regular rhythm and intact distal pulses. No M/R/G Pulmonary/chest: Effort normal and breath sounds normal. No wheezing, rales or rhonchi. Abdominal: Soft, obese, nontender, nondistended. Bowel sounds active throughout.  Extremities: no clubbing, cyanosis, or edema Lymphadenopathy: No cervical adenopathy noted. Neurological: Alert and oriented to person place and time. Skin: Skin is warm and dry. No rashes noted. Psychiatric: Normal mood and affect. Behavior is normal.  CBC    Component Value Date/Time   WBC 6.8 12/15/2014 1459   RBC 4.22 12/15/2014 1459   HGB 11.9* 12/15/2014 1459   HCT 35.3* 12/15/2014 1459   PLT 284.0 12/15/2014 1459   MCV 83.5 12/15/2014 1459   MCH 28.6 09/09/2013 1153   MCHC 33.6 12/15/2014 1459   RDW 14.0 12/15/2014 1459   LYMPHSABS 2.6 12/15/2014 1459   MONOABS 0.5 12/15/2014 1459   EOSABS 0.1 12/15/2014 1459   BASOSABS 0.0 12/15/2014 1459    CMP     Component Value Date/Time   NA 136 12/15/2014 1459   K 3.4* 12/15/2014 1459   CL 101 12/15/2014 1459   CO2 30 12/15/2014 1459   GLUCOSE 110* 12/15/2014 1459   BUN 14 12/15/2014 1459   CREATININE 0.98 12/15/2014 1459   CREATININE 1.12* 08/31/2014 1031   CALCIUM 9.4 12/15/2014 1459   PROT 7.9 12/15/2014 1459   ALBUMIN 3.9 12/15/2014 1459   AST 21 12/15/2014 1459   ALT 17 12/15/2014 1459   ALKPHOS 77 12/15/2014 1459  BILITOT 0.5 12/15/2014 1459   GFRNONAA 71* 08/03/2011 1341   GFRAA 82* 08/03/2011 1341    abd [place] - reviewed     Assessment & Plan:   59 year old female with a history of GERD, dyspepsia, epigastric pain and bloating who seen for follow-up.  1. GERD/dyspepsia --  upper endoscopy normal. No evidence of H. pylori. Symptoms improved on AcipHex. Continue AcipHex 20 mg daily. GERD diet encouraged.  2. Constipation -- mild. Using MiraLAX sporadically. We discussed daily use would likely result in better symptom management. I recommended MiraLAX 17 g on a daily basis  3. Liver nodularity by ultrasound -- no evidence of liver enzyme abnormality or viral hepatitis or genetic/autoimmune liver disease. No evidence of decompensated cirrhosis or portal hypertension. I recommended a more objective assessment of fibrosis with ultrasound plus elastography to be performed in 6 months. She can follow with me thereafter. Of note she does not drink alcohol.  4. Colorectal cancer screening -- repeat colonoscopy recommended 2023

## 2015-03-04 ENCOUNTER — Other Ambulatory Visit: Payer: Self-pay | Admitting: Family Medicine

## 2015-03-04 DIAGNOSIS — Z1231 Encounter for screening mammogram for malignant neoplasm of breast: Secondary | ICD-10-CM

## 2015-03-21 ENCOUNTER — Telehealth: Payer: Self-pay | Admitting: Family Medicine

## 2015-03-21 DIAGNOSIS — R21 Rash and other nonspecific skin eruption: Secondary | ICD-10-CM

## 2015-03-21 NOTE — Telephone Encounter (Signed)
Was seen in our office for rash under breast and given cream. It has not helped and the rash a gotten worse. Patient requesting referral to dermatology. Patient has Medicaid and would prefer office in [facility] vs. [place].

## 2015-03-21 NOTE — Telephone Encounter (Signed)
Referral placed.

## 2015-03-21 NOTE — Telephone Encounter (Signed)
Patient was seen on 12-30-14 for this concern by Dr. Birdie Sons.  Will forward to new MD. Burnard Hawthorne

## 2015-03-24 ENCOUNTER — Ambulatory Visit (HOSPITAL_COMMUNITY): Payer: Medicaid Other

## 2015-04-15 ENCOUNTER — Telehealth: Payer: Self-pay | Admitting: Clinical

## 2015-04-15 NOTE — Telephone Encounter (Signed)
CSW returned pts call inquiring about her new eyes application. CSW informed pt that CSW called her in July to inform her that the application was denied due to funding. Pt has Medicaid and should only have a $3 copay for glasses. CSW LVM with this information.  [name], MSW, LCSW [phone]

## 2015-04-20 ENCOUNTER — Ambulatory Visit (HOSPITAL_COMMUNITY)
Admission: RE | Admit: 2015-04-20 | Discharge: 2015-04-20 | Disposition: A | Discharge status: Home / Self Care | Payer: Medicaid Other | Source: Ambulatory Visit | Attending: Family Medicine | Admitting: Family Medicine

## 2015-04-20 DIAGNOSIS — Z1231 Encounter for screening mammogram for malignant neoplasm of breast: Secondary | ICD-10-CM | POA: Insufficient documentation

## 2015-04-25 ENCOUNTER — Other Ambulatory Visit: Payer: Self-pay | Admitting: Family Medicine

## 2015-04-25 DIAGNOSIS — R928 Other abnormal and inconclusive findings on diagnostic imaging of breast: Secondary | ICD-10-CM

## 2015-04-29 ENCOUNTER — Ambulatory Visit
Admission: RE | Admit: 2015-04-29 | Discharge: 2015-04-29 | Disposition: A | Discharge status: Home / Self Care | Payer: Medicaid Other | Source: Ambulatory Visit | Attending: Family Medicine | Admitting: Family Medicine

## 2015-04-29 DIAGNOSIS — R928 Other abnormal and inconclusive findings on diagnostic imaging of breast: Secondary | ICD-10-CM

## 2015-05-02 ENCOUNTER — Telehealth: Payer: Self-pay | Admitting: Family Medicine

## 2015-05-02 NOTE — Telephone Encounter (Signed)
Mammogram results called and left message. Negative.   CGM MD

## 2015-05-02 NOTE — Telephone Encounter (Signed)
Pt is returning Dr. Jaquita Rector call and was given the information. jw

## 2015-05-09 ENCOUNTER — Encounter: Payer: Self-pay | Admitting: Family Medicine

## 2015-05-09 ENCOUNTER — Ambulatory Visit (INDEPENDENT_AMBULATORY_CARE_PROVIDER_SITE_OTHER): Payer: Medicaid Other | Admitting: Family Medicine

## 2015-05-09 VITALS — BP 154/79 | HR 80 | Temp 98.1°F | Wt 193.2 lb

## 2015-05-09 DIAGNOSIS — R300 Dysuria: Secondary | ICD-10-CM | POA: Diagnosis present

## 2015-05-09 LAB — GLUCOSE, CAPILLARY: Glucose-Capillary: 84 mg/dL (ref 65–99)

## 2015-05-09 LAB — POCT URINALYSIS DIPSTICK
Bilirubin, UA: NEGATIVE
Glucose, UA: NEGATIVE
LEUKOCYTES UA: NEGATIVE
Nitrite, UA: NEGATIVE
PH UA: 7
PROTEIN UA: NEGATIVE
RBC UA: NEGATIVE
SPEC GRAV UA: 1.02
Urobilinogen, UA: 2

## 2015-05-09 NOTE — Progress Notes (Signed)
    Subjective   Megan Miller is a 59 y.o. female that presents for a same day visit  1. Frequency: Symptoms started one week ago. No associated dysuria or urgency. No fevers, nausea or vomiting. No sick contacts. No vaginal discharge. She does report polydipsia.  ROS Per HPI  Social History  Substance Use Topics  . Smoking status: Never Smoker   . Smokeless tobacco: Never Used  . Alcohol Use: No    Allergies  Allergen Reactions  . Codeine Phosphate Itching    REACTION: Denies rash or allergic reaction.  Marland Kitchen Percocet [Oxycodone-Acetaminophen] Nausea And Vomiting    Objective   BP 154/79 mmHg  Pulse 80  Temp(Src) 98.1 F (36.7 C) (Oral)  Wt 193 lb 3.2 oz (87.635 kg)  General: Well appearing, no distress Genitourinary: No suprapubic tenderness  Assessment and Plan   No orders of the defined types were placed in this encounter.    Frequency: UA not suggestive of UTI. Fasting blood sugar not elevated.  Does not meet criteria for UTI  Return if symptoms do not improve

## 2015-05-11 ENCOUNTER — Telehealth: Payer: Self-pay | Admitting: Family Medicine

## 2015-05-11 NOTE — Telephone Encounter (Signed)
Pt was seen by Dr. Caleb PoppNettey and put on a fluid pill, pt says she feels the urge to urinate often but cannot go, wants to know if she should continue the fluid pill?

## 2015-05-12 ENCOUNTER — Encounter: Payer: Self-pay | Admitting: Family Medicine

## 2015-05-12 NOTE — Patient Instructions (Signed)
Patient left before AVS could be typed and presented.

## 2015-05-12 NOTE — Telephone Encounter (Signed)
I did not start this lady on any fluid pill. This will be a question for her PCP.

## 2015-05-13 NOTE — Telephone Encounter (Signed)
She is not on a "fluid" pill based on her chart review. Please let her know this.   CGM MD

## 2015-05-16 NOTE — Telephone Encounter (Signed)
LM for patient to call back. Zubayr Bednarczyk,CMA  

## 2015-05-23 NOTE — Telephone Encounter (Signed)
Will wait to hear from patient.  LM for her to call back. Jazmin Hartsell,CMA

## 2015-06-04 ENCOUNTER — Telehealth: Payer: Self-pay | Admitting: Family Medicine

## 2015-06-04 MED ORDER — OMEPRAZOLE 40 MG PO CPDR: 40.0000 mg | DELAYED_RELEASE_CAPSULE | Freq: Every day | ORAL | Status: DC

## 2015-06-04 NOTE — Telephone Encounter (Signed)
After-hours Telephone call  Patient reports that she is awaiting approval for Rabeprazole for treatment of GERD.  She reports she needs something to take until this is approved.  She does not think that she has taken omeprazole.  Will Rx Omeprazole to take until this is approved.  Erasmo DownerAngela M Bacigalupo, MD, MPH PGY-2,  [facility] 06/04/2015 11:10 AM

## 2015-06-06 ENCOUNTER — Encounter: Payer: Self-pay | Admitting: Family Medicine

## 2015-06-06 ENCOUNTER — Ambulatory Visit: Payer: Medicaid Other | Admitting: Family Medicine

## 2015-06-06 ENCOUNTER — Ambulatory Visit (INDEPENDENT_AMBULATORY_CARE_PROVIDER_SITE_OTHER): Payer: Medicaid Other | Admitting: Family Medicine

## 2015-06-06 VITALS — BP 138/64 | HR 78 | Temp 97.9°F | Ht 61.0 in | Wt 186.0 lb

## 2015-06-06 DIAGNOSIS — T50905A Adverse effect of unspecified drugs, medicaments and biological substances, initial encounter: Secondary | ICD-10-CM

## 2015-06-06 DIAGNOSIS — T887XXA Unspecified adverse effect of drug or medicament, initial encounter: Secondary | ICD-10-CM

## 2015-06-06 NOTE — Progress Notes (Signed)
Patient ID: Megan Miller, female   DOB: 05-30-1956, 59 y.o.   MRN: [id number]   [facility] Yolande Jolly, MD Phone: [phone]  Subjective:   # Medication Follow Up  - Pt. Says she has been taking Lisinopril - HCTZ. She says she takes this medication at night, and that sometimes it makes her go to the bathroom.  - I explained that this is an expected effect.  - she says that it does not bother her, and that she is ok with it the way it is now as long as this is normal.   # Zoloft.  - pt. Not taking.  - She says it makes her sleepy.  - She says she does not feel depressed.  - She says that she gets frustrated because her neighbors harass her but otherwise she does not have depression.    All relevant systems were reviewed and were negative unless otherwise noted in the HPI  Past Medical History Reviewed problem list.  Medications- reviewed and updated Current Outpatient Prescriptions  Medication Sig Dispense Refill  . aspirin 81 MG tablet Take 1 tablet (81 mg total) by mouth daily. 90 tablet 3  . carvedilol (COREG) 25 MG tablet Take 1 tablet (25 mg total) by mouth 2 (two) times daily. 60 tablet 5  . cetirizine (ZYRTEC) 10 MG tablet Take 1 tablet (10 mg total) by mouth daily. 30 tablet 1  . desonide (DESOWEN) 0.05 % cream Apply topically 2 (two) times daily. 30 g 0  . ibuprofen (ADVIL,MOTRIN) 800 MG tablet Take 1 tablet (800 mg total) by mouth every 8 (eight) hours as needed. 60 tablet 2  . lisinopril-hydrochlorothiazide (PRINZIDE,ZESTORETIC) 20-12.5 MG per tablet Take 1 tablet by mouth daily. 30 tablet 11  . minoxidil (ROGAINE) 2 % external solution Apply topically 2 (two) times daily. 60 mL 0  . nitroGLYCERIN (NITRODUR - DOSED IN MG/24 HR) 0.2 mg/hr patch APPLY 1/4 PATCH TO AFFECTED AREA. CHANGE PATCH EVERY 24 HOURS. 30 patch 1  . nystatin cream (MYCOSTATIN) Apply 1 application topically 2 (two) times daily. 30 g 0  . olopatadine (PATANOL) 0.1 %  ophthalmic solution Place 1 drop into both eyes 2 (two) times daily. For allergy symptoms     . omeprazole (PRILOSEC) 40 MG capsule Take 1 capsule (40 mg total) by mouth daily. 30 capsule 0  . PAZEO 0.7 % SOLN Place into the right eye daily.   3  . polyethylene glycol powder (GLYCOLAX/MIRALAX) powder Dissolve 17 grams in at least 8 ounces water/juice and drink daily 527 g 4  . Potassium Chloride ER 20 MEQ TBCR Take 20 mEq by mouth 2 (two) times daily at 10 AM and 5 PM. 6 tablet 0  . RABEprazole (ACIPHEX) 20 MG tablet TAKE 1 TABLET (20 MG TOTAL) BY MOUTH DAILY. 30 tablet 3  . RESTASIS 0.05 % ophthalmic emulsion Place 1 drop into the right eye daily.   3  . sertraline (ZOLOFT) 50 MG tablet Take 1 tablet (50 mg total) by mouth daily. 30 tablet 3  . simethicone (GAS-X) 80 MG chewable tablet Chew 1 tablet (80 mg total) by mouth every 6 (six) hours as needed for flatulence. 30 tablet 0   No current facility-administered medications for this visit.   Chief complaint-noted No additions to family history Social history- patient is a non smoker  Objective: BP 138/64 mmHg  Pulse 78  Temp(Src) 97.9 F (36.6 C) (Oral)  Ht 5\' 1"  (1.549 m)  Wt  186 lb (84.369 kg)  BMI 35.16 kg/m2  SpO2 98% Gen: NAD, alert, cooperative with exam HEENT: NCAT, EOMI, PERRL Neck: FROM, supple CV: RRR, good S1/S2, no murmur Resp: CTABL, no wheezes, non-labored Abd: SNTND, BS present, no guarding or organomegaly Ext: No edema, warm, normal tone, moves UE/LE spontaneously Neuro: Alert and oriented, No gross deficits Skin: no rashes no lesions  Assessment/Plan:  HTN - Continue current regimen / plan.  - May d/c the lisinopril HCTZ in the future if it causes problems for her.  - Blood pressure well controlled.   Depression - No positive SIGECAPS.  - She describes depression as her neighbors harassing her.  - She does not like zoloft and does not take it anyway.  - May discontinue this.

## 2015-06-06 NOTE — Patient Instructions (Signed)
Thanks for coming in today.   You should continue to take your blood pressure medicine if you tolerate it.   You can discontinue the zoloft if it is not helping you.   Thanks for letting us take care of you.   If you need anything else, just let us know.   Sincerely,  Devota Pacealeb Walta [place], MD Family Medicine - PGY 2

## 2015-06-08 ENCOUNTER — Ambulatory Visit (HOSPITAL_COMMUNITY): Payer: Medicaid Other

## 2015-06-10 ENCOUNTER — Other Ambulatory Visit: Payer: Self-pay | Admitting: *Deleted

## 2015-06-13 ENCOUNTER — Encounter: Payer: Self-pay | Admitting: Cardiology

## 2015-06-14 MED ORDER — IBUPROFEN 800 MG PO TABS: 800.0000 mg | ORAL_TABLET | Freq: Three times a day (TID) | ORAL | Status: DC | PRN

## 2015-07-04 ENCOUNTER — Encounter: Payer: Self-pay | Admitting: Gastroenterology

## 2015-08-03 ENCOUNTER — Telehealth: Payer: Self-pay | Admitting: Clinical

## 2015-08-03 NOTE — Telephone Encounter (Signed)
Will forward to MD so he is aware. Jazmin Hartsell,CMA  

## 2015-08-03 NOTE — Telephone Encounter (Signed)
CSW received a call from pt requesting for her provider to complete the "Application for Identification Card" to be completed in order for her to qualify for a free ID card. CSW has placed the application in her PCP's box.  Megan Miller, MSW, LCSW [phone]

## 2015-08-10 NOTE — Telephone Encounter (Signed)
Form is for free ID for individuals with mental disability. Pt. Does not meet this requirement. Unclear if she still qualifies. I have left form with Theresia BoughNorma Wilson. Will follow up on further need for documentation to get ID for pt. Thanks  CGM MD

## 2015-08-10 NOTE — Telephone Encounter (Signed)
If you would contact her, that would be helpful. Let her know that I am happy to see her in the office or help her otherwise with getting an ID if there is anything else that I can do. Thanks  CGM MD

## 2015-08-10 NOTE — Telephone Encounter (Signed)
She would need to have a developmental disability to qualify. Would you like me to contact her and inform her that we cannot complete the application?  Theresia BoughNorma Lillyanne Miller, MSW, LCSW [phone]

## 2015-08-11 NOTE — Telephone Encounter (Signed)
CSW contacted Ms. Barman and informed her that we do not have a dx in her chart that would qualify for her to get a free ID card. The pt informed CSW that as a child she was dx with a developmental disability & was granted disability as a child. The pt contacted social security and informed CSW that she was dx as a Estate agent"slow learner and a mood disorder." Pt confirmed she was in special education courses as a child and was a slow Advice workerlearner.  Dr. Jaquita RectorMelancon would you like for me to proceed with sending off the referral for her free ID card?

## 2015-08-16 NOTE — Telephone Encounter (Signed)
CSW attempted to call pt back however when calling it says "this is not a working number." CSW awaiting to hear back from PCP to determine whether CSW can proceed with faxing the application for a free ID card. CSW will notify the pt when she is able to reach her.  [name], MSW, LCSW [phone]

## 2015-08-16 NOTE — Telephone Encounter (Signed)
Yes, I have the form and will proceed with faxing it. Thank you.

## 2015-08-16 NOTE — Telephone Encounter (Signed)
Thanks for getting in touch with her about all of this. If there are records with Social Security of this diagnosis, I'm happy to fill it out for her. I believe that I had already filled it out and left it on your desk. It is fine to fax the application when you are able. Thanks  CGM MD

## 2015-08-17 NOTE — Telephone Encounter (Signed)
Great! Thanks  CGM MD

## 2015-08-29 ENCOUNTER — Telehealth: Payer: Self-pay | Admitting: Family Medicine

## 2015-08-29 NOTE — Telephone Encounter (Signed)
DMV:  Need one of the nurses to call back about the application for ID card.

## 2015-08-30 NOTE — Telephone Encounter (Signed)
Spoke with DMV and they have taken care of form.  Patient needs to take the form to her local DMV so she can get her picture taken.  They have mailed back form to patient and tried contacting her by phone but the number we have listed isn't working.  I have tried to contacting patient also but number still isn't in service. Megan Miller,CMA

## 2015-09-03 IMAGING — CR DG ABDOMEN 2V
2 series · 2 of 2 positions shown · non-contrast
Comparison: None currently available

CLINICAL DATA: Mid abdominal pain for 1 month,

EXAM:
ABDOMEN - 2 VIEW

[w abdomen upright *]
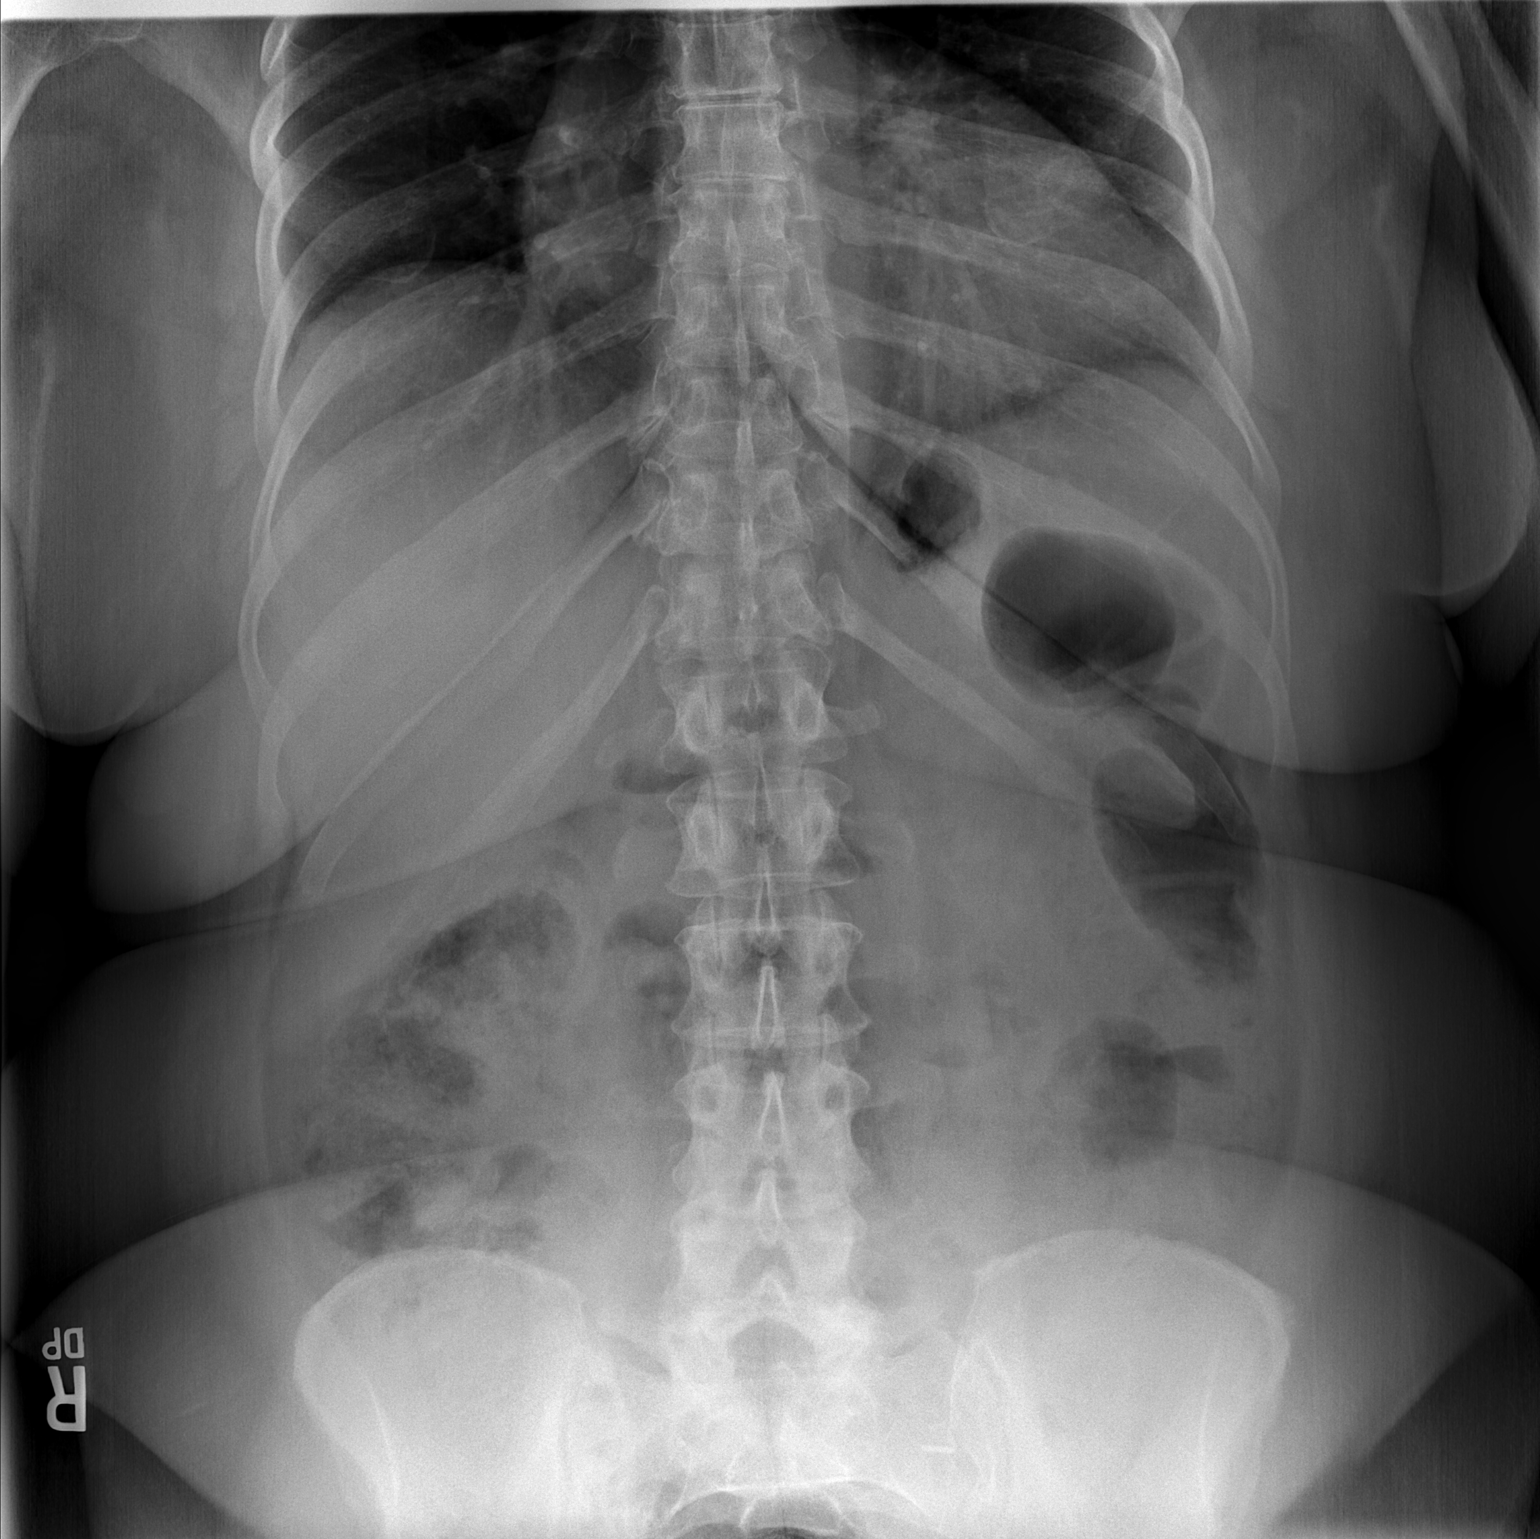

[t abdomen supine]
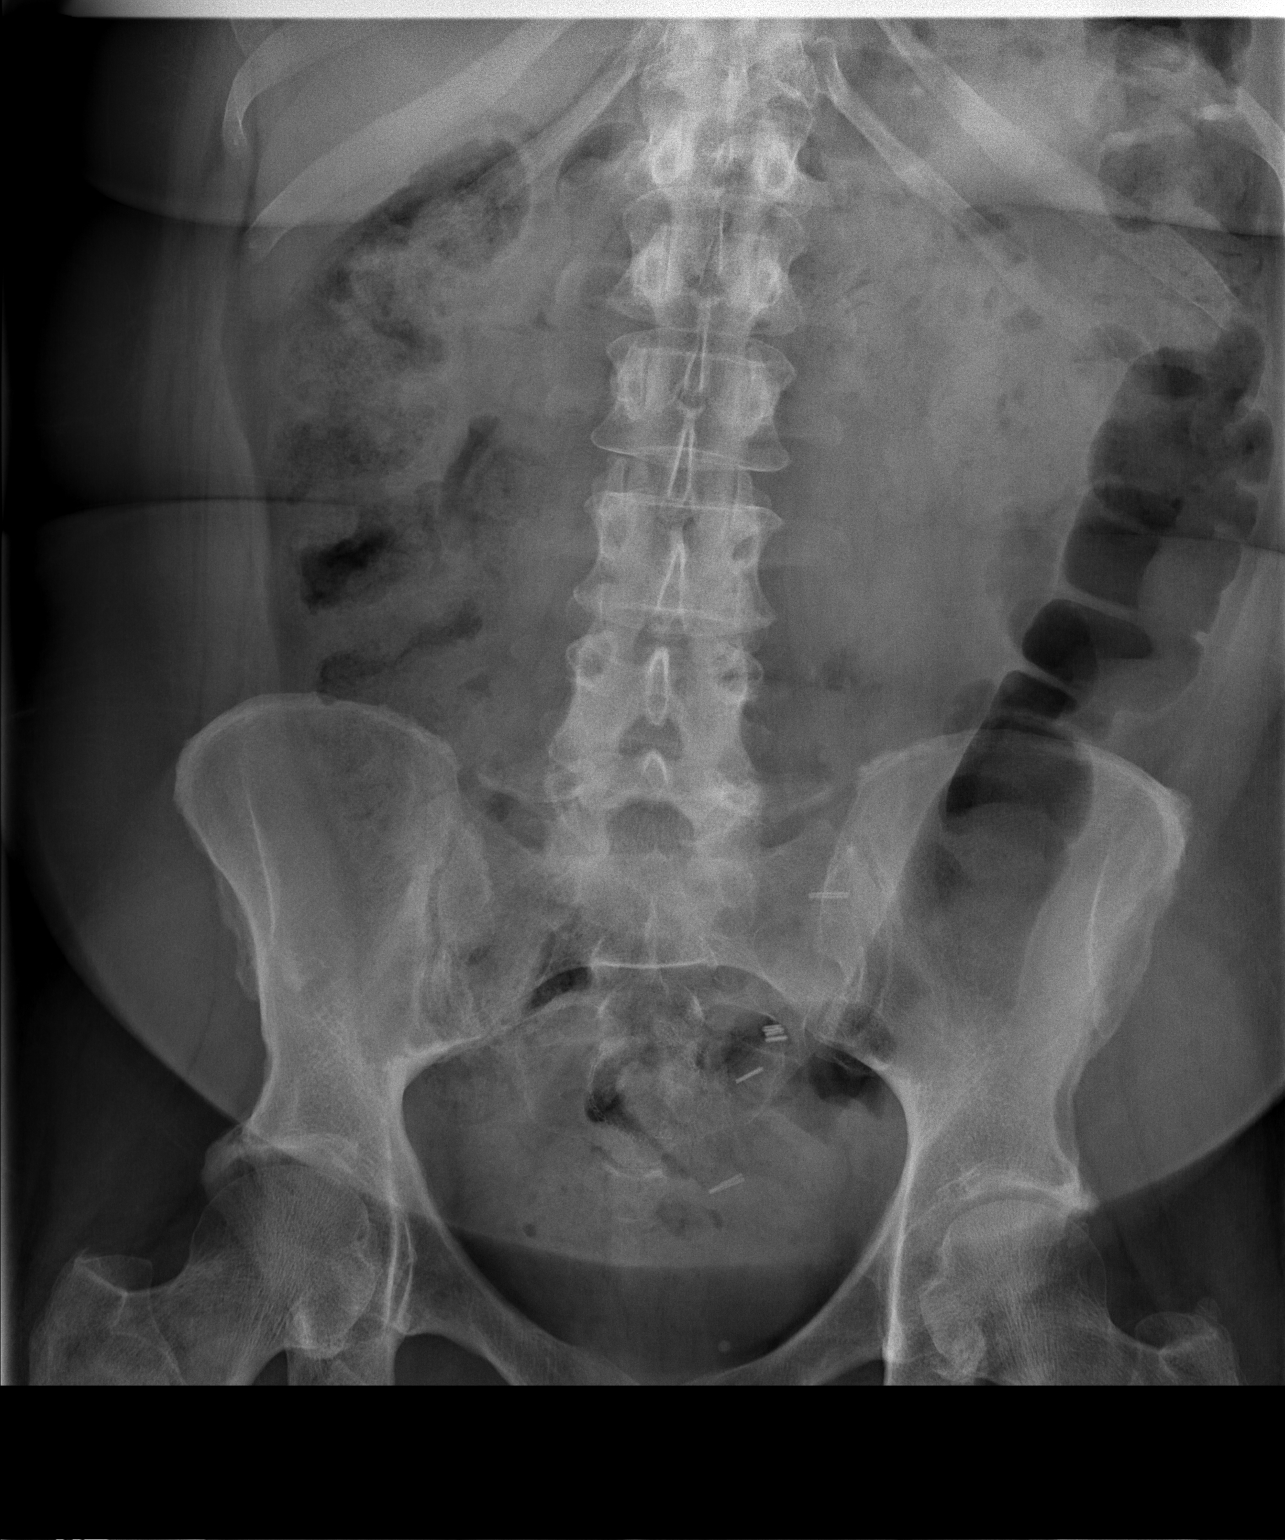

[2 of 2 positions shown; findings below may reference images not displayed]

FINDINGS: Moderate retention of stool in the ascending and transverse colon,
with prominent colonic gas in the descending colon. Bowel
anastomosis noted in the pelvis, small bowel based on the electronic
medical record. No concerning intra-abdominal mass effect or
calcification. No pneumoperitoneum.

Mild cardiomegaly.  Lung bases are clear.
IMPRESSION: Possible constipation.  No bowel obstruction.

## 2015-09-23 ENCOUNTER — Other Ambulatory Visit: Payer: Self-pay | Admitting: Internal Medicine

## 2015-09-29 ENCOUNTER — Telehealth: Payer: Self-pay | Admitting: Family Medicine

## 2015-09-29 NOTE — Telephone Encounter (Signed)
Tried to contact patient regarding her concerns but number listed for her is not working.  I attempted dialing it 3 more times with the same message.  If patient calls back please ask her to make an appt with her pcp to discuss these concerns.  We don't currently have a Child psychotherapist on site and would have to contact the hospital social workers dependent on her concern.  We could also have her speak with our [facility] at her appt.  Thanks [name]

## 2015-09-29 NOTE — Telephone Encounter (Signed)
Please contact patient regarding her wanting to speak with a Child psychotherapist

## 2015-10-06 ENCOUNTER — Ambulatory Visit (INDEPENDENT_AMBULATORY_CARE_PROVIDER_SITE_OTHER): Payer: Medicaid Other | Admitting: Family Medicine

## 2015-10-06 ENCOUNTER — Encounter: Payer: Self-pay | Admitting: Family Medicine

## 2015-10-06 VITALS — BP 116/53 | HR 77 | Temp 97.8°F | Resp 20 | Ht 61.0 in | Wt 193.8 lb

## 2015-10-06 DIAGNOSIS — L609 Nail disorder, unspecified: Secondary | ICD-10-CM | POA: Diagnosis not present

## 2015-10-06 DIAGNOSIS — R61 Generalized hyperhidrosis: Secondary | ICD-10-CM | POA: Diagnosis present

## 2015-10-06 NOTE — Patient Instructions (Signed)
Thanks for coming in today.   The sweating is normal, it may be best to turn up the [facility] in your care whenever you go out, or wear fewer layers.   Take a multivitamin to help with your nails.   Return for follow up for screening lab work and yearly visit in 3 months.   Thanks for letting us take care of you.   Sincerely, Devota Pacealeb Azula Zappia, MD Family Medicine - PGY 2

## 2015-10-06 NOTE — Progress Notes (Signed)
Patient ID: Megan Miller, female   DOB: 10/08/1955, 60 y.o.   MRN: [id number]   [facility] Yolande Jolly, MD Phone: [phone]  Subjective:   # Sweating - sweats whenever she gets outside and it is warm.  - She says she starts to sweat through her shirt.  - This happens only when she is outside.  - No night sweats.  - No weight loss.  - No malaise.  - She says she wishes she didn't sweat so much when she is out.   # Spots on Nails  - Here because she has a spot on her right long finger finger nail.  - She also has ridges / grooves on her nails.  - Says she has had these for some time.  - Hx of very mild Anemia in the past. - - otherwise has a varied diet.  - No COPD - No fevers, chills.   All relevant systems were reviewed and were negative unless otherwise noted in the HPI  Past Medical History Reviewed problem list.  Medications- reviewed and updated Current Outpatient Prescriptions  Medication Sig Dispense Refill  . aspirin 81 MG tablet Take 1 tablet (81 mg total) by mouth daily. 90 tablet 3  . carvedilol (COREG) 25 MG tablet Take 1 tablet (25 mg total) by mouth 2 (two) times daily. 60 tablet 5  . cetirizine (ZYRTEC) 10 MG tablet Take 1 tablet (10 mg total) by mouth daily. 30 tablet 1  . desonide (DESOWEN) 0.05 % cream Apply topically 2 (two) times daily. 30 g 0  . ibuprofen (ADVIL,MOTRIN) 800 MG tablet Take 1 tablet (800 mg total) by mouth every 8 (eight) hours as needed. 60 tablet 2  . lisinopril-hydrochlorothiazide (PRINZIDE,ZESTORETIC) 20-12.5 MG per tablet Take 1 tablet by mouth daily. 30 tablet 11  . minoxidil (ROGAINE) 2 % external solution Apply topically 2 (two) times daily. 60 mL 0  . nitroGLYCERIN (NITRODUR - DOSED IN MG/24 HR) 0.2 mg/hr patch APPLY 1/4 PATCH TO AFFECTED AREA. CHANGE PATCH EVERY 24 HOURS. 30 patch 1  . nystatin cream (MYCOSTATIN) Apply 1 application topically 2 (two) times daily. 30 g 0  . olopatadine (PATANOL)  0.1 % ophthalmic solution Place 1 drop into both eyes 2 (two) times daily. For allergy symptoms     . omeprazole (PRILOSEC) 40 MG capsule Take 1 capsule (40 mg total) by mouth daily. 30 capsule 0  . PAZEO 0.7 % SOLN Place into the right eye daily.   3  . polyethylene glycol powder (GLYCOLAX/MIRALAX) powder Dissolve 17 grams in at least 8 ounces water/juice and drink daily 527 g 4  . Potassium Chloride ER 20 MEQ TBCR Take 20 mEq by mouth 2 (two) times daily at 10 AM and 5 PM. 6 tablet 0  . RABEprazole (ACIPHEX) 20 MG tablet TAKE 1 TABLET (20 MG TOTAL) BY MOUTH DAILY. 30 tablet 3  . RESTASIS 0.05 % ophthalmic emulsion Place 1 drop into the right eye daily.   3  . sertraline (ZOLOFT) 50 MG tablet Take 1 tablet (50 mg total) by mouth daily. 30 tablet 3  . simethicone (GAS-X) 80 MG chewable tablet Chew 1 tablet (80 mg total) by mouth every 6 (six) hours as needed for flatulence. 30 tablet 0   No current facility-administered medications for this visit.   Chief complaint-noted No additions to family history Social history- patient is a non smoker  Objective: BP 116/53 mmHg  Pulse 77  Temp(Src) 97.8 F (36.6  C) (Oral)  Resp 20  Ht 5\' 1"  (1.549 m)  Wt 193 lb 12.8 oz (87.907 kg)  BMI 36.64 kg/m2  SpO2 98% Gen: NAD, alert, cooperative with exam HEENT: NCAT, EOMI, PERRL Neck: FROM, supple CV: RRR, good S1/S2, no murmur Resp: CTABL, no wheezes, non-labored Abd: SNTND, BS present, no guarding or organomegaly Ext: No edema, warm, normal tone, moves UE/LE spontaneously, nails with small typical grooves on them. Small bruise underneath right long finger fingernail.  Neuro: Alert and oriented, No gross deficits Skin: no rashes no lesions  Assessment/Plan:  # Sweating  - normal sweating due to deconditioning / wearing too many layers. - - fewer layers, [facility] up the [facility] - No red flag signs / symptoms.   # Fingernails  - Likely due to nutritional deficiency - Take a multivitamin daily.  -  Mild Anemia on last lab check.  - Normal Ferritin.  - Check routine labs in 3 months.   # HCM - up to date for the most part.  - check routine labs in 3 months  - F/U in 3  Months.

## 2015-10-09 ENCOUNTER — Other Ambulatory Visit: Payer: Self-pay | Admitting: Internal Medicine

## 2015-11-02 ENCOUNTER — Telehealth: Payer: Self-pay | Admitting: Licensed Clinical Social Worker

## 2015-11-02 NOTE — Telephone Encounter (Signed)
CSW received call from patient requesting assistance with a letter so that she can obtain a free medical alert.    CSW reviewed the website provided by patient [email or web address]    Requires submitting a letter from patient's church or physician.  Patient states she does not attend church regularly and this is not an option for her.   Patient is requesting a letter from MD to assist her with getting the free device.  Letter should include.  Full Name:   Address Telephone Number  Age    "The above information must be included in a letter on the letterhead of the church you attend or from your physician. Any missing information will result in the application being denied.  Please mail the letter on church letterhead or physician letterhead to:" [employer] Application [address] [place], [place] [id number]  Request sent to MD  Megan Miller. LCSWA Clinical Social Work,  [phone] 2:13 PM

## 2015-11-04 ENCOUNTER — Encounter: Payer: Self-pay | Admitting: Family Medicine

## 2015-11-04 IMAGING — US US ABDOMEN COMPLETE
1 series · 14 of 25 positions shown · non-contrast
Comparison: None.

CLINICAL DATA: Abdominal pain, bloating

EXAM:
ULTRASOUND ABDOMEN COMPLETE

[Series 1: us abdomen complete · 0.18mm/px · 14 of 102 slices shown]
[im 1/102]
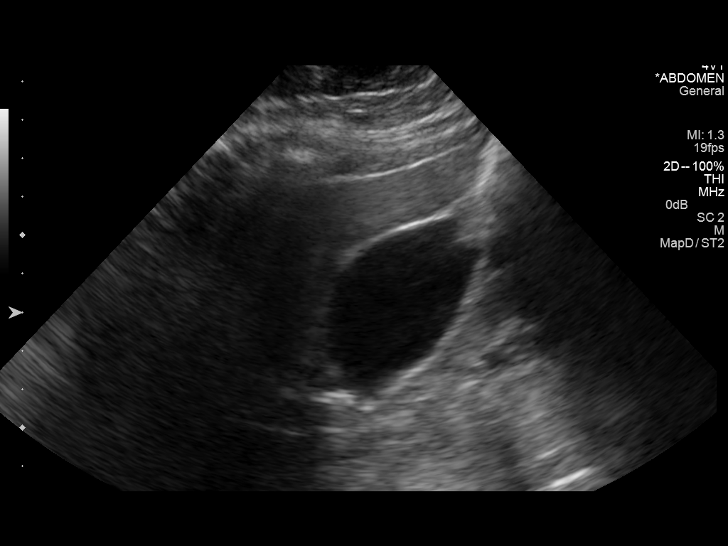
[im 9/102]
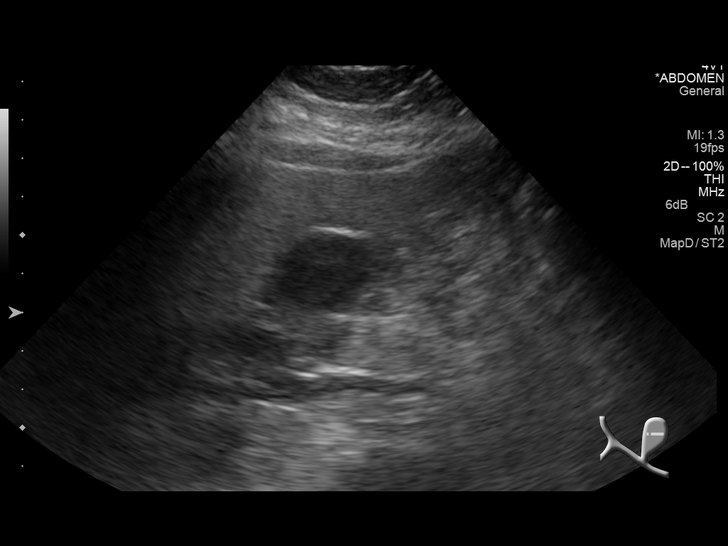
[im 17/102]
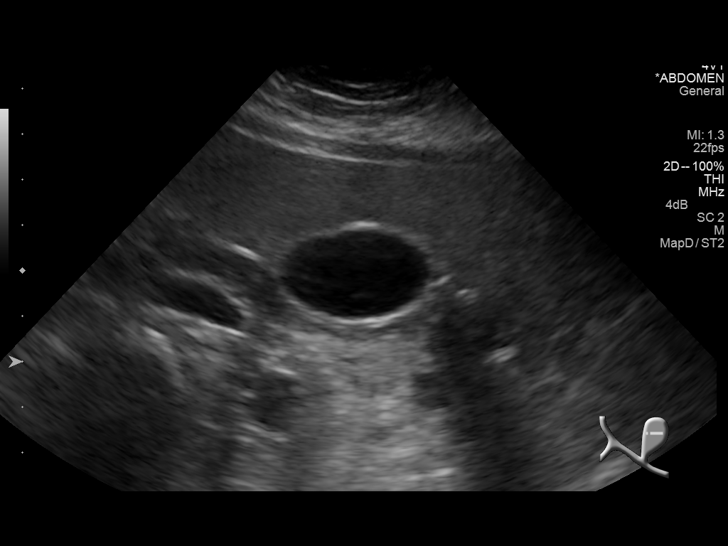
[im 26/102]
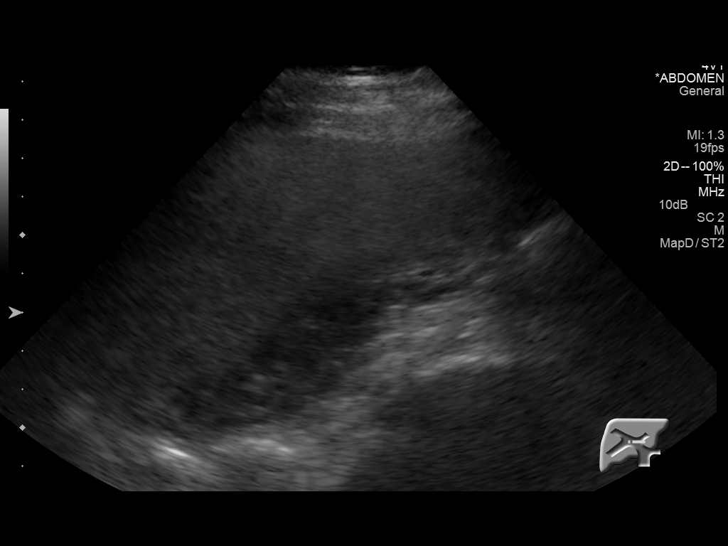
[im 34/102]
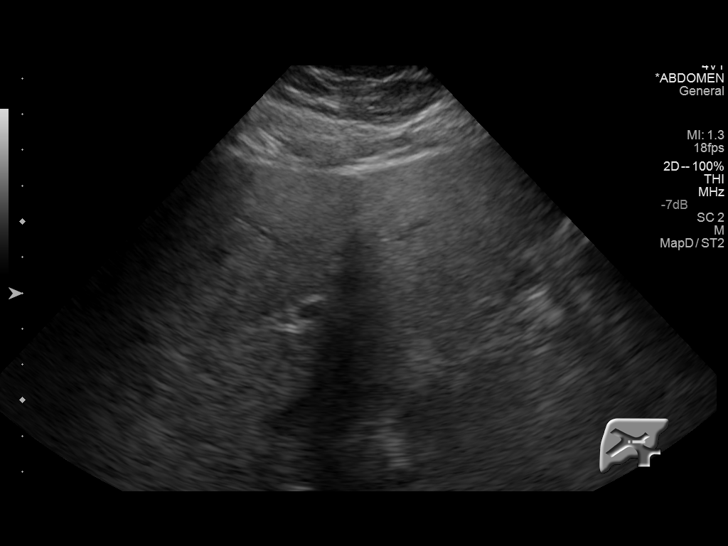
[im 38/102]
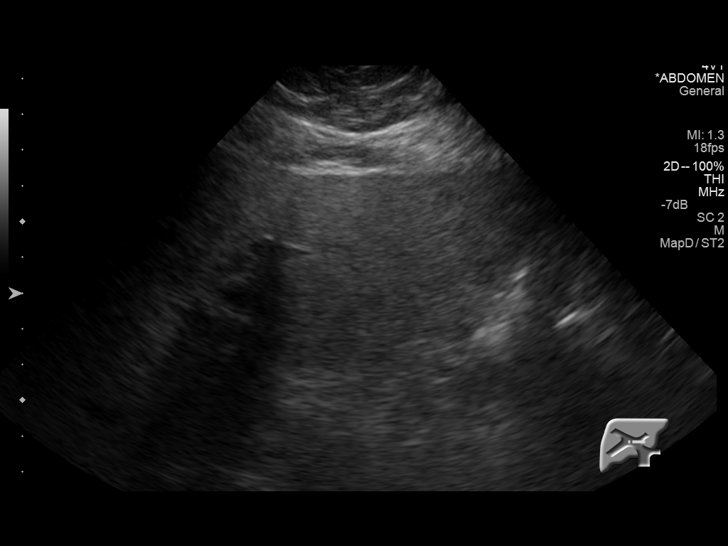
[im 47/102]
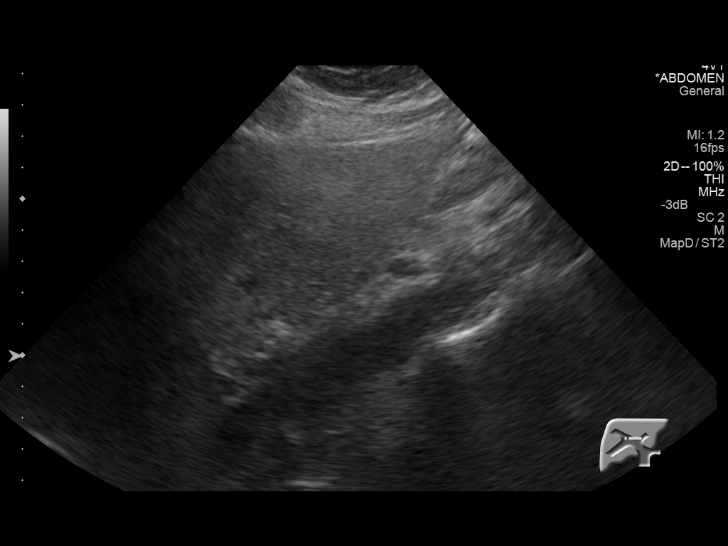
[im 55/102]
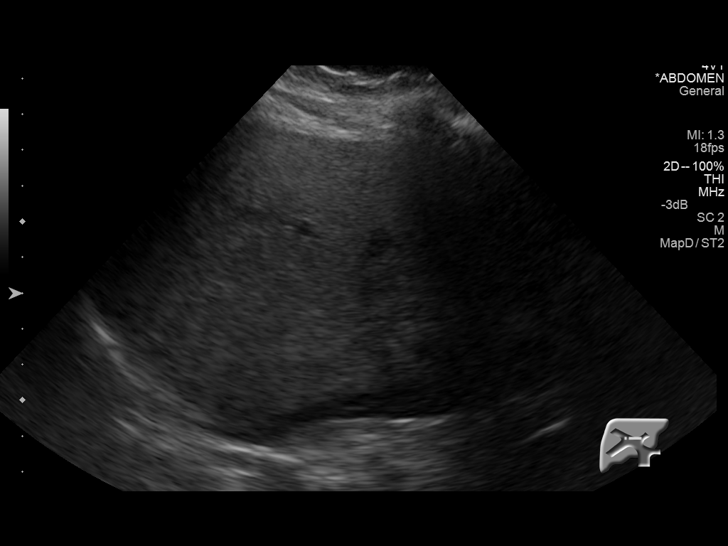
[im 64/102]
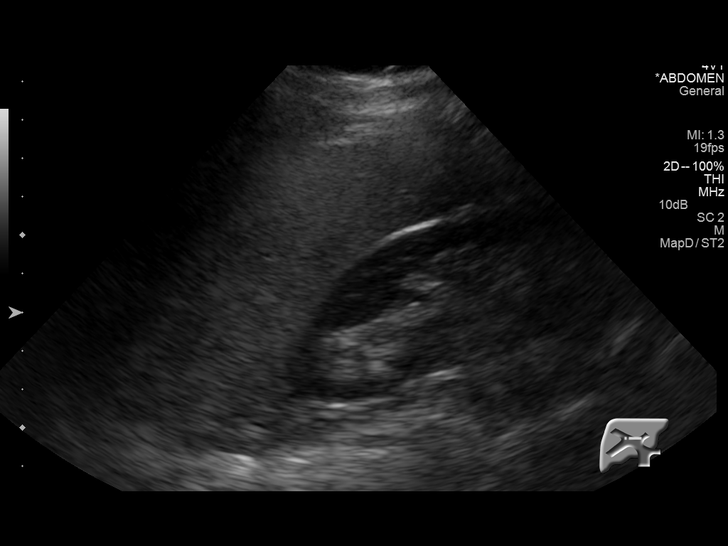
[im 68/102]
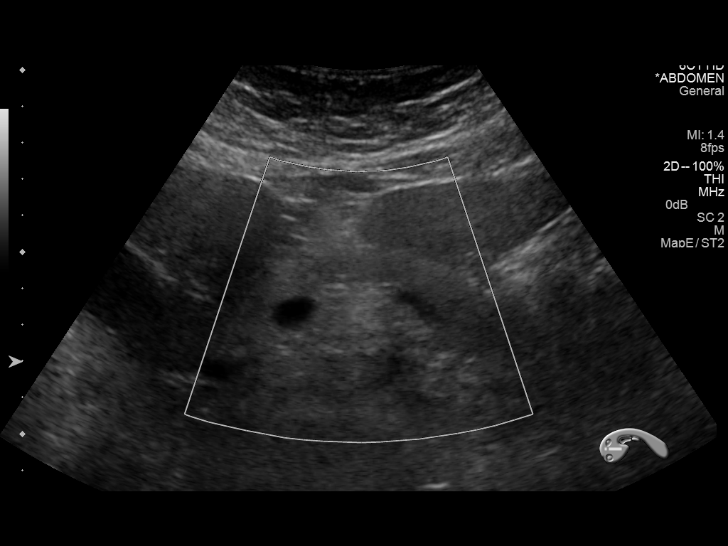
[im 76/102]
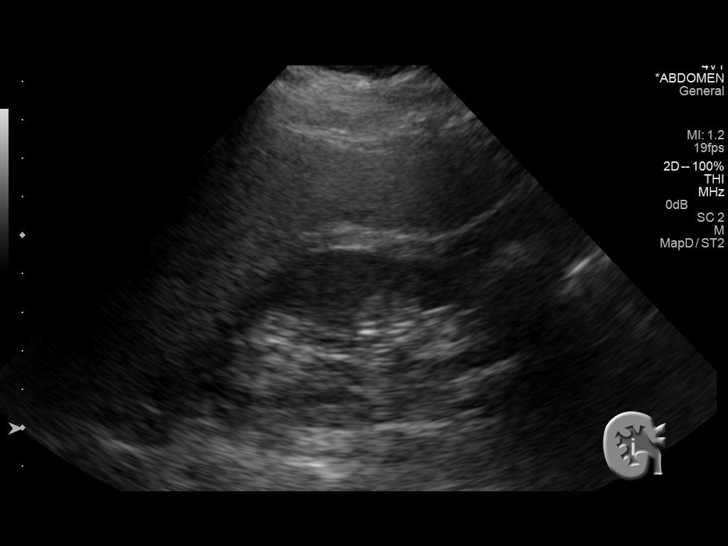
[im 85/102]
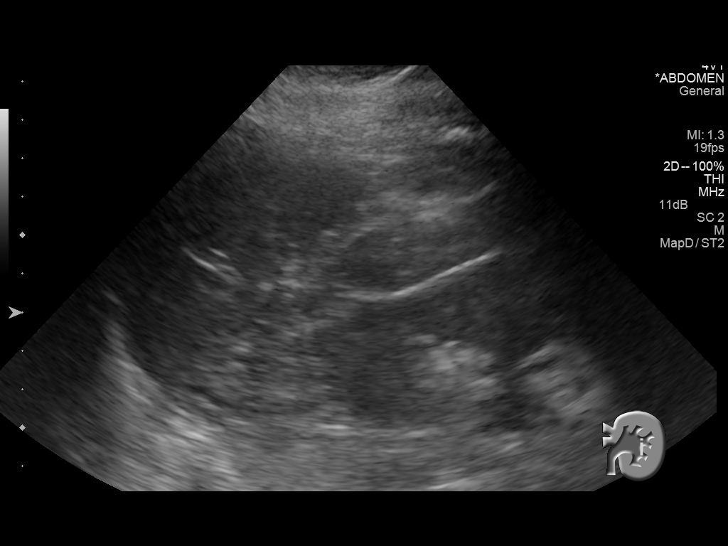
[im 93/102]
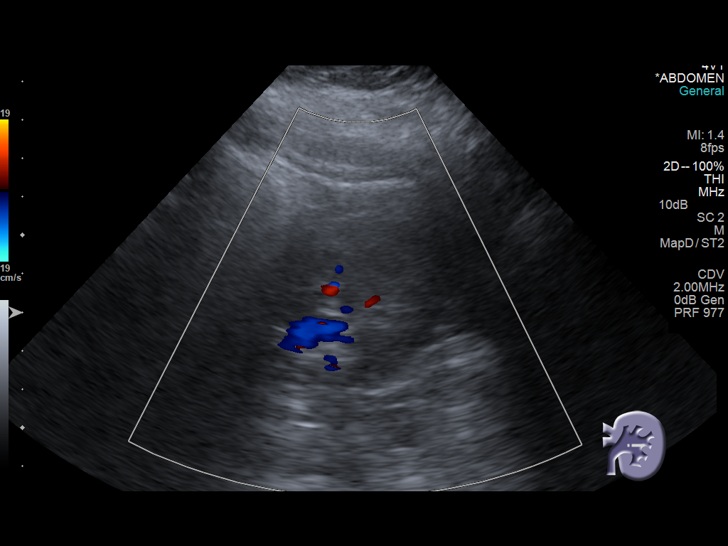
[im 102/102]
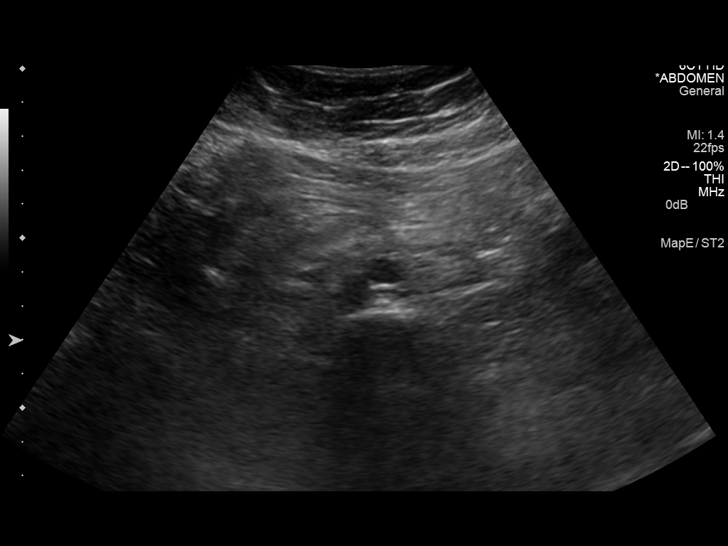

[14 of 25 positions shown; findings below may reference images not displayed]

FINDINGS: Gallbladder: No gallstones, gallbladder wall thickening, or
pericholecystic fluid. Negative sonographic Murphy's sign.

Common bile duct: Diameter: 2 mm

Liver: Coarse hepatic echotexture with mildly nodular hepatic
contour. No focal hepatic lesion is seen.

IVC: No abnormality visualized.

Pancreas: Visualized portion unremarkable.

Spleen: Size and appearance within normal limits.

Right Kidney: Length: 9.6 cm.  No mass or hydronephrosis.

Left Kidney: Length: 10.5 cm.  No mass or hydronephrosis.

Abdominal aorta: No aneurysm visualized.

Other findings: None.
IMPRESSION: Coarse hepatic echotexture with mildly nodular hepatic contour,
raising the possibility of cirrhosis.

Otherwise negative abdominal ultrasound.

## 2015-11-04 NOTE — Telephone Encounter (Signed)
Call to patient to inform her the requested letter for her medical alert has been completed by MD and sent.   Megan Hineseborah Caedan Sumler. LCSWA Clinical Social Work,  [phone] 2:33 PM

## 2015-11-04 NOTE — Telephone Encounter (Signed)
Letter sent. Thanks  CGM MD

## 2015-11-06 ENCOUNTER — Emergency Department (HOSPITAL_COMMUNITY): Payer: Medicaid Other

## 2015-11-06 ENCOUNTER — Encounter (HOSPITAL_COMMUNITY): Payer: Self-pay | Admitting: Emergency Medicine

## 2015-11-06 ENCOUNTER — Emergency Department (HOSPITAL_COMMUNITY)
Admission: EM | Admit: 2015-11-06 | Discharge: 2015-11-06 | Disposition: A | Discharge status: Home / Self Care | Payer: Medicaid Other | Attending: Emergency Medicine | Admitting: Emergency Medicine

## 2015-11-06 DIAGNOSIS — R519 Headache, unspecified: Secondary | ICD-10-CM

## 2015-11-06 DIAGNOSIS — Z7982 Long term (current) use of aspirin: Secondary | ICD-10-CM | POA: Insufficient documentation

## 2015-11-06 DIAGNOSIS — Z79899 Other long term (current) drug therapy: Secondary | ICD-10-CM | POA: Diagnosis not present

## 2015-11-06 DIAGNOSIS — R569 Unspecified convulsions: Secondary | ICD-10-CM | POA: Diagnosis not present

## 2015-11-06 DIAGNOSIS — G8929 Other chronic pain: Secondary | ICD-10-CM | POA: Insufficient documentation

## 2015-11-06 DIAGNOSIS — R1084 Generalized abdominal pain: Secondary | ICD-10-CM | POA: Diagnosis not present

## 2015-11-06 DIAGNOSIS — R55 Syncope and collapse: Secondary | ICD-10-CM | POA: Diagnosis not present

## 2015-11-06 DIAGNOSIS — I10 Essential (primary) hypertension: Secondary | ICD-10-CM | POA: Diagnosis not present

## 2015-11-06 DIAGNOSIS — R51 Headache: Secondary | ICD-10-CM | POA: Diagnosis present

## 2015-11-06 LAB — URINALYSIS, ROUTINE W REFLEX MICROSCOPIC
BILIRUBIN URINE: NEGATIVE
Glucose, UA: NEGATIVE mg/dL
HGB URINE DIPSTICK: NEGATIVE
Ketones, ur: NEGATIVE mg/dL
Leukocytes, UA: NEGATIVE
Nitrite: NEGATIVE
PH: 6 (ref 5.0–8.0)
Protein, ur: NEGATIVE mg/dL
SPECIFIC GRAVITY, URINE: 1.02 (ref 1.005–1.030)

## 2015-11-06 LAB — CBC WITH DIFFERENTIAL/PLATELET
Basophils Absolute: 0 10*3/uL (ref 0.0–0.1)
Basophils Relative: 0 %
Eosinophils Absolute: 0 10*3/uL (ref 0.0–0.7)
Eosinophils Relative: 0 %
HEMATOCRIT: 36.6 % (ref 36.0–46.0)
Hemoglobin: 11.7 g/dL — ABNORMAL LOW (ref 12.0–15.0)
LYMPHS PCT: 37 %
Lymphs Abs: 2.7 10*3/uL (ref 0.7–4.0)
MCH: 27.3 pg (ref 26.0–34.0)
MCHC: 32 g/dL (ref 30.0–36.0)
MCV: 85.5 fL (ref 78.0–100.0)
MONO ABS: 0.5 10*3/uL (ref 0.1–1.0)
MONOS PCT: 7 %
NEUTROS ABS: 3.9 10*3/uL (ref 1.7–7.7)
Neutrophils Relative %: 56 %
Platelets: 277 10*3/uL (ref 150–400)
RBC: 4.28 MIL/uL (ref 3.87–5.11)
RDW: 13.4 % (ref 11.5–15.5)
WBC: 7.1 10*3/uL (ref 4.0–10.5)

## 2015-11-06 LAB — COMPREHENSIVE METABOLIC PANEL
ALBUMIN: 3.3 g/dL — AB (ref 3.5–5.0)
ALT: 16 U/L (ref 14–54)
AST: 20 U/L (ref 15–41)
Alkaline Phosphatase: 75 U/L (ref 38–126)
Anion gap: 12 (ref 5–15)
BILIRUBIN TOTAL: 0.5 mg/dL (ref 0.3–1.2)
BUN: 14 mg/dL (ref 6–20)
CO2: 27 mmol/L (ref 22–32)
Calcium: 9 mg/dL (ref 8.9–10.3)
Chloride: 100 mmol/L — ABNORMAL LOW (ref 101–111)
Creatinine, Ser: 0.9 mg/dL (ref 0.44–1.00)
GFR calc Af Amer: >60 mL/min (ref >60)
GFR calc non Af Amer: >60 mL/min (ref >60)
GLUCOSE: 100 mg/dL — AB (ref 65–99)
POTASSIUM: 2.9 mmol/L — AB (ref 3.5–5.1)
Sodium: 139 mmol/L (ref 135–145)
TOTAL PROTEIN: 7.8 g/dL (ref 6.5–8.1)

## 2015-11-06 LAB — LIPASE, BLOOD: Lipase: 24 U/L (ref 11–51)

## 2015-11-06 LAB — CK: Total CK: 142 U/L (ref 38–234)

## 2015-11-06 MED ORDER — SODIUM CHLORIDE 0.9 % IV BOLUS (SEPSIS)
1000.0000 mL | Freq: Once | INTRAVENOUS | Status: AC
  Administered 2015-11-06: 1000 mL via INTRAVENOUS

## 2015-11-06 MED ORDER — ONDANSETRON HCL 4 MG/2ML IJ SOLN
4.0000 mg | Freq: Once | INTRAMUSCULAR | Status: AC
  Administered 2015-11-06: 4 mg via INTRAVENOUS
  Filled 2015-11-06: qty 2

## 2015-11-06 MED ORDER — POTASSIUM CHLORIDE 10 MEQ/100ML IV SOLN
10.0000 meq | Freq: Once | INTRAVENOUS | Status: AC
  Administered 2015-11-06: 10 meq via INTRAVENOUS
  Filled 2015-11-06: qty 100

## 2015-11-06 MED ORDER — KETOROLAC TROMETHAMINE 30 MG/ML IJ SOLN
30.0000 mg | Freq: Once | INTRAMUSCULAR | Status: AC
  Administered 2015-11-06: 30 mg via INTRAVENOUS
  Filled 2015-11-06: qty 1

## 2015-11-06 NOTE — ED Notes (Signed)
Patient transported to CT 

## 2015-11-06 NOTE — ED Notes (Signed)
Pt c/o diffuse abd pain and headache sinceTuesday. Denies N/V/D. Pt reports normal BM yesterday. Denies urinary symptoms. Pt states she may have had a seizure Monday. She was evaluated by EMS but not transported.

## 2015-11-06 NOTE — ED Provider Notes (Signed)
CSN: [id number]     Arrival date & time 11/06/15  1105 History   First MD Initiated Contact with Patient 11/06/15 1132     Chief Complaint  Patient presents with  . Abdominal Pain  . Headache   HPI Comments: 60 year old female presents with abdominal pain and HA for the past 5 days. She states she had a "light seizure" which was unwittnesed on Tuesday and ever since then has had diffuse abdominal pain and a headache. She denies history of seizures - EMS was called to the scene and told her she had one. She had prodromal symptoms of feeling hot, lightheaded, and had to sit down. When she woke up she had vomited on herself and EMS was at the scene. No tongue injury, bowel/bladder incontinence. She describes her abdominal pain as achy, 7/10. Her headache she states is a "light headache", 7/10, in the front. Motrin has helped although she states she has only taken one. Movement makes her pain worse. Denies fever, chills, chest pain, SOB, previous CVA, weakness on one side of the body, aphasia, vision changes, LOC, dizziness, lightheadedness, syncope, head trauma. She reports associated nausea, but no vomiting, diarrhea, blood in stool or urine, dysuria. She is also reporting back pain which she also describes as "sore".  Patient is a 60 y.o. female presenting with abdominal pain and headaches. The history is provided by the patient.  Abdominal Pain Associated symptoms: no chest pain, no chills, no constipation, no diarrhea, no dysuria, no fever, no nausea, no shortness of breath and no vomiting   Headache Associated symptoms: abdominal pain and seizures   Associated symptoms: no diarrhea, no dizziness, no fever, no nausea, no numbness, no vomiting and no weakness     Past Medical History  Diagnosis Date  . Allergic conjunctivitis   . Leg edema     Asymetric Right leg (dopplers negative)  . Chronic back pain   . Chest pain   . Constipation, chronic   . Dyspnea on exertion   . Dysthymic   . GERD  (gastroesophageal reflux disease)   . Hypertension   . Overweight (BMI 25.0-29.9)   . Urination frequency    Past Surgical History  Procedure Laterality Date  . Bilateral salpingoophorectomy  05/08/2001    Path=extensive endometriosis  . Abdominal hysterectomy  09/28/1999    for leimyomas  . Breast surgery  12/29/2003    Right breast core biopsy=benign fibrocystic change  . Small intestine surgery  03/30/2001    Rectosigmoid bowel resection and reanastomosis   Family History  Problem Relation Age of Onset  . Hypertension Mother   . Diabetes Father   . Diabetes Sister   . Asthma Brother   . Diabetes Brother   . Colon cancer Neg Hx   . Colon polyps Neg Hx   . Esophageal cancer Neg Hx   . Kidney disease Brother    Social History  Substance Use Topics  . Smoking status: Never Smoker   . Smokeless tobacco: Never Used  . Alcohol Use: No   OB History    No data available     Review of Systems  Constitutional: Negative for fever and chills.  Eyes: Negative for visual disturbance.  Respiratory: Negative for shortness of breath.   Cardiovascular: Negative for chest pain.  Gastrointestinal: Positive for abdominal pain. Negative for nausea, vomiting, diarrhea, constipation and blood in stool.  Genitourinary: Negative for dysuria.  Neurological: Positive for seizures and headaches. Negative for dizziness, tremors, syncope, facial  asymmetry, speech difficulty, weakness, light-headedness and numbness.  All other systems reviewed and are negative.     Allergies  Codeine phosphate and Percocet  Home Medications   Prior to Admission medications   Medication Sig Start Date End Date Taking? Authorizing Provider  ACIPHEX 20 MG tablet TAKE 1 TABLET (20 MG TOTAL) BY MOUTH DAILY. 10/10/15   Beverley FiedlerJay M Pyrtle, MD  aspirin 81 MG tablet Take 1 tablet (81 mg total) by mouth daily. 09/23/14   Glori LuisEric G Sonnenberg, MD  carvedilol (COREG) 25 MG tablet Take 1 tablet (25 mg total) by mouth 2 (two)  times daily. 02/04/15   Yolande Jollyaleb G Melancon, MD  cetirizine (ZYRTEC) 10 MG tablet Take 1 tablet (10 mg total) by mouth daily. 01/04/15   Glori LuisEric G Sonnenberg, MD  desonide (DESOWEN) 0.05 % cream Apply topically 2 (two) times daily. 04/08/13   Amber Nydia BoutonM Hairford, MD  ibuprofen (ADVIL,MOTRIN) 800 MG tablet Take 1 tablet (800 mg total) by mouth every 8 (eight) hours as needed. 06/14/15   Yolande Jollyaleb G Melancon, MD  lisinopril-hydrochlorothiazide (PRINZIDE,ZESTORETIC) 20-12.5 MG per tablet Take 1 tablet by mouth daily. 10/15/14   Glori LuisEric G Sonnenberg, MD  minoxidil (ROGAINE) 2 % external solution Apply topically 2 (two) times daily. 11/27/13   Amber Nydia BoutonM Hairford, MD  nitroGLYCERIN (NITRODUR - DOSED IN MG/24 HR) 0.2 mg/hr patch APPLY 1/4 PATCH TO AFFECTED AREA. CHANGE PATCH EVERY 24 HOURS.    Amber Nydia BoutonM Hairford, MD  nystatin cream (MYCOSTATIN) Apply 1 application topically 2 (two) times daily. 01/04/15   Glori LuisEric G Sonnenberg, MD  olopatadine (PATANOL) 0.1 % ophthalmic solution Place 1 drop into both eyes 2 (two) times daily. For allergy symptoms     Historical Provider, MD  omeprazole (PRILOSEC) 40 MG capsule Take 1 capsule (40 mg total) by mouth daily. 06/04/15   Erasmo DownerAngela M Bacigalupo, MD  PAZEO 0.7 % SOLN Place into the right eye daily.  12/09/14   Historical Provider, MD  polyethylene glycol powder (GLYCOLAX/MIRALAX) powder Dissolve 17 grams in at least 8 ounces water/juice and drink daily 02/24/15   Beverley FiedlerJay M Pyrtle, MD  Potassium Chloride ER 20 MEQ TBCR Take 20 mEq by mouth 2 (two) times daily at 10 AM and 5 PM. 12/17/14   Amy S Esterwood, PA-C  RESTASIS 0.05 % ophthalmic emulsion Place 1 drop into the right eye daily.  12/09/14   Historical Provider, MD  sertraline (ZOLOFT) 50 MG tablet Take 1 tablet (50 mg total) by mouth daily. 01/25/15   Glori LuisEric G Sonnenberg, MD  simethicone (GAS-X) 80 MG chewable tablet Chew 1 tablet (80 mg total) by mouth every 6 (six) hours as needed for flatulence. 06/08/14   Tobey GrimJeffrey H Walden, MD   BP 129/64 mmHg  Pulse  86  Temp(Src) 98.8 F (37.1 C) (Oral)  Resp 18  Ht 5\' 1"  (1.549 m)  Wt 86.183 kg  BMI 35.92 kg/m2  SpO2 98% Physical Exam  Constitutional: She is oriented to person, place, and time. She appears well-developed and well-nourished. No distress.  HENT:  Head: Normocephalic and atraumatic.  Eyes: Conjunctivae are normal. Pupils are equal, round, and reactive to light. Right eye exhibits no discharge. Left eye exhibits no discharge. No scleral icterus.  Neck: Normal range of motion.  Cardiovascular: Normal rate and regular rhythm.  Exam reveals no gallop and no friction rub.   No murmur heard. Pulmonary/Chest: Effort normal. No respiratory distress. She has no wheezes. She has no rales. She exhibits no tenderness.  Abdominal: Soft. Bowel  sounds are normal. She exhibits no distension and no mass. There is tenderness. There is no rebound and no guarding.  RUQ, epigastric, LUQ tenderness  Musculoskeletal: She exhibits no edema.  Neurological: She is alert and oriented to person, place, and time.  Mental Status:  Alert, oriented, thought content appropriate, able to give a coherent history. Speech fluent without evidence of aphasia. Able to follow 2 step commands without difficulty.  Cranial Nerves:  II:  Peripheral visual fields grossly normal, pupils equal, round, reactive to light III,IV, VI: ptosis not present, extra-ocular motions intact bilaterally  V,VII: smile symmetric, facial light touch sensation equal VIII: hearing grossly normal to voice  X: uvula elevates symmetrically  XI: bilateral shoulder shrug symmetric and strong XII: midline tongue extension without fassiculations Motor:  Normal tone. 5/5 in upper and lower extremities bilaterally including strong and equal grip strength and dorsiflexion/plantar flexion Sensory: Light touch normal in all extremities.  Cerebellar: normal finger-to-nose with bilateral upper extremities Gait: normal gait and balance CV: distal pulses  palpable throughout     Skin: Skin is warm and dry.  Psychiatric: She has a normal mood and affect.    ED Course  Procedures (including critical care time) Labs Review Labs Reviewed  COMPREHENSIVE METABOLIC PANEL - Abnormal; Notable for the following:    Potassium 2.9 (*)    Chloride 100 (*)    Glucose, Bld 100 (*)    Albumin 3.3 (*)    All other components within normal limits  CBC WITH DIFFERENTIAL/PLATELET - Abnormal; Notable for the following:    Hemoglobin 11.7 (*)    All other components within normal limits  URINALYSIS, ROUTINE W REFLEX MICROSCOPIC (NOT AT [facility]) - Abnormal; Notable for the following:    APPearance CLOUDY (*)    All other components within normal limits  LIPASE, BLOOD  CK    Imaging Review Ct Head Wo Contrast  11/06/2015  CLINICAL DATA:  Frontal headache since Monday.  Possible seizure. EXAM: CT HEAD WITHOUT CONTRAST TECHNIQUE: Contiguous axial images were obtained from the base of the skull through the vertex without contrast. COMPARISON:  None FINDINGS: Few punctate high-density areas along the left frontal lobe on images 11 and 12. This is probably artifact. No other evidence for subarachnoid blood or intracranial hemorrhage. No evidence for mass lesion, midline shift hydrocephalus or large infarct. 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 Visualized paranasal sinuses are clear. No calvarial fracture. IMPRESSION: No acute intracranial abnormality. A few punctate densities along the left frontal lobe as described. Unless the patient has a history of trauma, this is probably artifact. Electronically Signed   By: Richarda Overlie M.D.   On: 11/06/2015 13:36   I have personally reviewed and evaluated these images and lab results as part of my medical decision-making.   EKG Interpretation   Date/Time:  Sunday November 06 2015 12:30:22 EDT Ventricular Rate:  74 PR Interval:  195 QRS Duration: 93 QT Interval:  403 QTC Calculation: 447 R Axis:   61 Text Interpretation:  Sinus  rhythm Consider left atrial enlargement  Borderline T abnormalities, anterior leads Baseline wander in lead(s) V1  since last tracing no significant change Confirmed by BELFI  MD, MELANIE  ([id number]) on 11/06/2015 12:35:20 PM Also confirmed by BELFI  MD, MELANIE  ([id number]), editor Dan Humphreys, CCT, SANDRA ([id number])  on 11/06/2015 12:36:11 PM     Meds given in ED:  Medications  sodium chloride 0.9 % bolus 1,000 mL (0 mLs Intravenous Stopped 11/06/15 1327)  ondansetron (ZOFRAN) injection 4  mg (4 mg Intravenous Given 11/06/15 1202)  ketorolac (TORADOL) 30 MG/ML injection 30 mg (30 mg Intravenous Given 11/06/15 1202)  potassium chloride 10 mEq in 100 mL IVPB (0 mEq Intravenous Stopped 11/06/15 1519)    New Prescriptions   No medications on file     MDM   Final diagnoses:  Syncope, unspecified syncope type  Nonintractable headache, unspecified chronicity pattern, unspecified headache type  Generalized abdominal pain   60 year old female presents with HA and abdominal pain following what sounds like a syncopal episode. IVF, Zofran, and Toradol given. Pt states her headache is improved from this. Labs are significant for hypokalemia (2.9) - gave K. Advised to eat K rich foods until she can follow up with her PCP. Also advised NSAIDs prn for HA. Head CT was neg for acute processes. There was some comment on punctate densities however patient states her LOC was non-traumatic as she was sitting down at the time. UA was clean. EKG reassuring. Patient is non-toxic, NAD, stable for discharge. Vitals are stable. Patient informed of clinical course, understand medical decision-making process, and agrees with plan.    Bethel Born, PA-C 11/06/15 1616  Rolan Bucco, MD 11/08/15 2235

## 2015-11-10 ENCOUNTER — Other Ambulatory Visit: Payer: Self-pay | Admitting: *Deleted

## 2015-11-10 ENCOUNTER — Telehealth: Payer: Self-pay | Admitting: Internal Medicine

## 2015-11-10 MED ORDER — RABEPRAZOLE SODIUM 20 MG PO TBEC: DELAYED_RELEASE_TABLET | ORAL | Status: DC

## 2015-11-10 NOTE — Telephone Encounter (Signed)
From what I can tell, we have never specified that patient is to receive "name brand" Aciphex. Per pharmacy, they are being audited and Medicaid does not cover Aciphex. I do not know what else we can offer.

## 2015-11-14 ENCOUNTER — Other Ambulatory Visit: Payer: Self-pay | Admitting: *Deleted

## 2015-11-14 DIAGNOSIS — I10 Essential (primary) hypertension: Secondary | ICD-10-CM

## 2015-11-15 ENCOUNTER — Ambulatory Visit (INDEPENDENT_AMBULATORY_CARE_PROVIDER_SITE_OTHER): Payer: Medicaid Other | Admitting: Internal Medicine

## 2015-11-15 ENCOUNTER — Encounter: Payer: Self-pay | Admitting: Internal Medicine

## 2015-11-15 VITALS — BP 140/77 | HR 78 | Temp 97.9°F | Wt 196.2 lb

## 2015-11-15 DIAGNOSIS — E876 Hypokalemia: Secondary | ICD-10-CM | POA: Diagnosis not present

## 2015-11-15 DIAGNOSIS — R55 Syncope and collapse: Secondary | ICD-10-CM | POA: Diagnosis present

## 2015-11-15 LAB — BASIC METABOLIC PANEL
BUN: 15 mg/dL (ref 7–25)
CO2: 23 mmol/L (ref 20–31)
Calcium: 8.4 mg/dL — ABNORMAL LOW (ref 8.6–10.4)
Chloride: 108 mmol/L (ref 98–110)
Creat: 0.78 mg/dL (ref 0.50–0.99)
GLUCOSE: 94 mg/dL (ref 65–99)
POTASSIUM: 3.8 mmol/L (ref 3.5–5.3)
SODIUM: 141 mmol/L (ref 135–146)

## 2015-11-15 MED ORDER — LISINOPRIL-HYDROCHLOROTHIAZIDE 20-12.5 MG PO TABS: 1.0000 | ORAL_TABLET | Freq: Every day | ORAL | Status: DC

## 2015-11-15 MED ORDER — RABEPRAZOLE SODIUM 20 MG PO TBEC: DELAYED_RELEASE_TABLET | ORAL | Status: DC

## 2015-11-15 NOTE — Assessment & Plan Note (Addendum)
Per patient, episode occurred 2 weeks ago. CT head in ED unremarkable. Possibly vasovagal per history. Patient was in a warm environment which may have precipitated this. Additionally have prodromal symptoms. Considered arrhythmia but patient had no palpitations, however patient had syncope while sitting. Per chart review has diagnosis of stable angina but normal stress test in 2016. Cardiac exam normal today. Symptoms were not consistent with seizure.   - encouraged to make an appointment with her cardiologist to determine if she needs work up for arrythmia

## 2015-11-15 NOTE — Patient Instructions (Signed)
Thank you for coming in . I will call you with your lab result. Please make an appointment with your heart doctor.

## 2015-11-15 NOTE — Assessment & Plan Note (Signed)
Noted in ED visit. K was 2.9. Repleted with x 1. Was on supplements in the past but pt reports this was discontinued years ago. Is on Lisinopril-HCTZ.  - BMP

## 2015-11-15 NOTE — Progress Notes (Signed)
Patient ID: Megan LloydVickie L Borrayo, female   DOB: [birth year], 60 y.o.   MRN: [id number] Date of Visit: 11/15/2015   HPI:  Patient presents for follow up after ED visit.   Syncope: - reports of seizure like activity about 2 weeks ago - patient was in her parked car eating fries; it was warm in her car but she parked in the shade - she was eating and suddenly became hot and diaphoretic and her eye "became blurry". She denies having chest pain, sob, palpitations. Unsure if she was nauseous. Then she lost consciousness  - this episode was not witnessed. Her friend found her in the car.  He had vomited and had "food in her mouth" - she was not confused after regaining consciousness. Her tongue was sore but denied any blood in mouth. No urinary/bowel incontinence - her friend called EMS, but patient declined to go the ED for evaluation because she felt better - no prior history of similar symptoms; no history of seizures   - a few days after her she started to have epigastric abdominal pain that is constant. Patient states this was similar to her prior chest pain. Denies diaphoresis, nausea, chest pain, sob. She also had a front achy "light" headache that was not relieved by motrin. She had these symptoms for 6 days and when motrin did not help, she went to the ED - evaluated in the ED on 11/06/15: she had CT head done (likely due to prior syncopal episode), EKG, CBC, CMP, CK, UA. CMP was remarkable for K at 2.9. She was given 10mEq IV K x 1. She was also given IVF bolus.  - patient reports symptoms resolved with IVF bolus  - no further syncopal episodes, abdominal pain, or HA since then. Patient is doing well.   Hypokalemia: - noted in ED Labs; ED repleted with 10mEq IV K  - patient was on potassium supplements in the past but reports this was discontinued years ago.   ROS: See HPI.  PMFSH:  HTN Past history of chest pain for which she was evaluated by cards, with normal stress test in  10/2014.  PHYSICAL EXAM: BP 140/77 mmHg  Pulse 78  Temp(Src) 97.9 F (36.6 C) (Oral)  Wt 196 lb 3.2 oz (88.996 kg) Gen: NAD Neck: normal thyroid on palpation Heart: RRR. No m/r/g Lungs: normal effort, CTAB Neuro: Awake, alert, no focal deficits grossly, normal speech  ASSESSMENT/PLAN:  .Syncope Per patient, episode occurred 2 weeks ago. CT head in ED unremarkable. Possibly vasovagal per history. Patient was in a warm environment which may have precipitated this. Additionally have prodromal symptoms. Considered arrhythmia but patient had no palpitations, however patient had syncope while sitting. Per chart review has diagnosis of stable angina but normal stress test in 2016. Cardiac exam normal today. Symptoms were not consistent with seizure.   - encouraged to make an appointment with her cardiologist to determine if she needs work up for arrythmia  Hypokalemia Noted in ED visit. K was 2.9. Repleted with 10mEq x 1. Was on supplements in the past but pt reports this was discontinued years ago. Is on Lisinopril-HCTZ.  - BMP      Palma HolterKanishka G Gunadasa, MD PGY 1 [facility]

## 2015-11-16 ENCOUNTER — Other Ambulatory Visit: Payer: Self-pay | Admitting: *Deleted

## 2015-11-16 MED ORDER — ACIPHEX 20 MG PO TBEC: 20.0000 mg | DELAYED_RELEASE_TABLET | Freq: Every day | ORAL | Status: DC

## 2015-11-16 NOTE — Telephone Encounter (Signed)
Received a call from [facility] needing a new script for patient's aciphex for the brand name.  She has medicaid and they don't cover the generic of this medication.  I sent in a new script for brand name only. Jazmin Hartsell,CMA

## 2015-11-23 ENCOUNTER — Telehealth: Payer: Self-pay | Admitting: Family Medicine

## 2015-11-23 NOTE — Telephone Encounter (Signed)
Pt states her pharmacy says that Aciphex RX needs PA.

## 2015-11-23 NOTE — Telephone Encounter (Signed)
Prior authorization was approved for Aciphex until 11/22/2016.  PA approval number [id number].  Clovis PuMartin, Tamika L, RN

## 2015-12-15 ENCOUNTER — Other Ambulatory Visit: Payer: Self-pay | Admitting: *Deleted

## 2015-12-15 DIAGNOSIS — I10 Essential (primary) hypertension: Secondary | ICD-10-CM

## 2015-12-16 MED ORDER — LISINOPRIL-HYDROCHLOROTHIAZIDE 20-12.5 MG PO TABS: 1.0000 | ORAL_TABLET | Freq: Every day | ORAL | Status: DC

## 2016-02-06 ENCOUNTER — Other Ambulatory Visit: Payer: Self-pay | Admitting: *Deleted

## 2016-02-07 MED ORDER — CARVEDILOL 25 MG PO TABS: 25.0000 mg | ORAL_TABLET | Freq: Two times a day (BID) | ORAL | Status: DC

## 2016-02-13 ENCOUNTER — Encounter: Payer: Self-pay | Admitting: Student

## 2016-02-13 ENCOUNTER — Ambulatory Visit (INDEPENDENT_AMBULATORY_CARE_PROVIDER_SITE_OTHER): Payer: Medicaid Other | Admitting: Student

## 2016-02-13 VITALS — BP 131/60 | HR 78 | Temp 98.5°F | Wt 192.0 lb

## 2016-02-13 DIAGNOSIS — Z0000 Encounter for general adult medical examination without abnormal findings: Secondary | ICD-10-CM

## 2016-02-13 DIAGNOSIS — E876 Hypokalemia: Secondary | ICD-10-CM | POA: Diagnosis not present

## 2016-02-13 DIAGNOSIS — I10 Essential (primary) hypertension: Secondary | ICD-10-CM | POA: Diagnosis not present

## 2016-02-13 LAB — BASIC METABOLIC PANEL
BUN: 13 mg/dL (ref 7–25)
CALCIUM: 8.4 mg/dL — AB (ref 8.6–10.4)
CO2: 26 mmol/L (ref 20–31)
CREATININE: 1.03 mg/dL — AB (ref 0.50–0.99)
Chloride: 102 mmol/L (ref 98–110)
Glucose, Bld: 127 mg/dL — ABNORMAL HIGH (ref 65–99)
Potassium: 3.6 mmol/L (ref 3.5–5.3)
Sodium: 138 mmol/L (ref 135–146)

## 2016-02-13 MED ORDER — ZOSTER VACCINE LIVE 19400 UNT/0.65ML ~~LOC~~ SUSR: 0.6500 mL | Freq: Once | SUBCUTANEOUS | Status: DC

## 2016-02-13 NOTE — Assessment & Plan Note (Signed)
Hypokalemia resolved on last check in April 2017 - Will recheck BMP today as she is still on potential potassium lowering agents, Prinzide

## 2016-02-13 NOTE — Patient Instructions (Addendum)
Follow-up in 3 months Obtain Zostavax vaccine from your pharmacy. A prescription has been sent to them If you have questions or concerns call the office at [phone]

## 2016-02-13 NOTE — Progress Notes (Signed)
Subjective:    Patient ID: Megan Miller, female    DOB: 1955/11/09, 60 y.o.   MRN: [id number]   CC: Follow-up blood pressure  HPI: 60 year old female presenting to follow blood pressure  Blood pressure - Started on Coreg for hypertension in addition to lisinopril Hydrocort thiazide - Does not regularly take her blood pressure at home - Denies headache, chest pain, shortness of breath  Smoking status reviewed  Review of Systems  Per HPI, else denies recent illness, fever, changes in vision, chest pain, shortness of breath, abdominal pain, N/V/D, weakness    Objective:  BP 131/60 mmHg  Pulse 78  Temp(Src) 98.5 F (36.9 C) (Oral)  Wt 192 lb (87.091 kg)  SpO2 97% Vitals and nursing note reviewed  General: NAD Cardiac: RRR,  Respiratory: CTAB, normal effort Extremities: no edema or cyanosis. WWP. Skin: warm and dry, no rashes noted Neuro: alert and oriented,    Assessment & Plan:    HYPERTENSION, BENIGN Blood pressure well controlled with Coreg and Prinzide - Continue current regimen  Hypokalemia Hypokalemia resolved on last check in April 2017 - Will recheck BMP today as she is still on potential potassium lowering agents, Prinzide  Healthcare maintenance Zostavax vaccine sent to pharmacy patient counseled to obtain it  She does not need Pap smear as she is a history of total abdominal hysterectomy for fibroids     Tuana Hoheisel A. Kennon Rounds MD, MS Family Medicine Resident PGY-2 Pager [phone]

## 2016-02-13 NOTE — Assessment & Plan Note (Signed)
Blood pressure well controlled with Coreg and Prinzide - Continue current regimen

## 2016-02-13 NOTE — Assessment & Plan Note (Signed)
Zostavax vaccine sent to pharmacy patient counseled to obtain it  She does not need Pap smear as she is a history of total abdominal hysterectomy for fibroids

## 2016-02-27 ENCOUNTER — Other Ambulatory Visit: Payer: Self-pay | Admitting: *Deleted

## 2016-02-27 ENCOUNTER — Other Ambulatory Visit: Payer: Self-pay | Admitting: Internal Medicine

## 2016-02-29 MED ORDER — ACIPHEX 20 MG PO TBEC: 20.0000 mg | DELAYED_RELEASE_TABLET | Freq: Every day | ORAL | 0 refills | Status: DC

## 2016-03-02 MED ORDER — ACIPHEX 20 MG PO TBEC: 20.0000 mg | DELAYED_RELEASE_TABLET | Freq: Every day | ORAL | 0 refills | Status: DC

## 2016-03-02 NOTE — Telephone Encounter (Signed)
Provider would have to seen in Rx to dispense as written for brand name only.  Clovis Pu, RN

## 2016-03-02 NOTE — Telephone Encounter (Signed)
Pt stated she was given the generic from her pharmacy and she does not want that. Pt requests brand name. Aciphex. Please advise. Thanks! ep

## 2016-04-03 ENCOUNTER — Other Ambulatory Visit: Payer: Self-pay | Admitting: *Deleted

## 2016-04-04 ENCOUNTER — Other Ambulatory Visit: Payer: Self-pay | Admitting: Family Medicine

## 2016-04-04 DIAGNOSIS — Z1231 Encounter for screening mammogram for malignant neoplasm of breast: Secondary | ICD-10-CM

## 2016-04-04 MED ORDER — ACIPHEX 20 MG PO TBEC: 20.0000 mg | DELAYED_RELEASE_TABLET | Freq: Every day | ORAL | 0 refills | Status: DC

## 2016-04-20 ENCOUNTER — Ambulatory Visit
Admission: RE | Admit: 2016-04-20 | Discharge: 2016-04-20 | Disposition: A | Discharge status: Home / Self Care | Payer: Medicaid Other | Source: Ambulatory Visit | Attending: Family Medicine | Admitting: Family Medicine

## 2016-04-20 DIAGNOSIS — Z1231 Encounter for screening mammogram for malignant neoplasm of breast: Secondary | ICD-10-CM

## 2016-05-04 ENCOUNTER — Telehealth: Payer: Self-pay | Admitting: Student

## 2016-05-04 NOTE — Telephone Encounter (Signed)
Will forward to MD to place a new referral for dermatology since it has been over a year and also if patient will need to be assessed in office for her excessive sweating. Lani Mendiola,CMA

## 2016-05-04 NOTE — Telephone Encounter (Signed)
Pt would like to be referred to the skin dr. She [facility] She was given something for itching but it wasn't working. She would also like to see somebody about excessive sweating.

## 2016-05-07 ENCOUNTER — Telehealth: Payer: Self-pay | Admitting: Student

## 2016-05-07 ENCOUNTER — Other Ambulatory Visit: Payer: Self-pay | Admitting: *Deleted

## 2016-05-07 DIAGNOSIS — L299 Pruritus, unspecified: Secondary | ICD-10-CM

## 2016-05-07 MED ORDER — ACIPHEX 20 MG PO TBEC: 20.0000 mg | DELAYED_RELEASE_TABLET | Freq: Every day | ORAL | 0 refills | Status: DC

## 2016-05-07 NOTE — Telephone Encounter (Signed)
Referral made to dermatology as requested. Patient called regarding concern for excessive sweating and itching under her breasts. She was counseled to kep the area clean and dry and if she had further concerns to make an appointment. The patient elected to be seen and will schedule an appointment

## 2016-05-18 ENCOUNTER — Encounter: Payer: Self-pay | Admitting: Student

## 2016-05-18 ENCOUNTER — Ambulatory Visit (INDEPENDENT_AMBULATORY_CARE_PROVIDER_SITE_OTHER): Payer: Medicaid Other | Admitting: Student

## 2016-05-18 DIAGNOSIS — R21 Rash and other nonspecific skin eruption: Secondary | ICD-10-CM | POA: Diagnosis not present

## 2016-05-18 DIAGNOSIS — Z0000 Encounter for general adult medical examination without abnormal findings: Secondary | ICD-10-CM

## 2016-05-18 MED ORDER — NYSTATIN 100000 UNIT/GM EX POWD: Freq: Four times a day (QID) | CUTANEOUS | 0 refills | Status: DC

## 2016-05-18 NOTE — Patient Instructions (Signed)
Follow-up as needed with PCP Please use nystatin powder under breasts to help with itching and sweating Please keep the area under breasts clean and dry. You may need to tell off intermittently throughout the day to help If you have any questions or concerns call the office at [phone]

## 2016-05-18 NOTE — Assessment & Plan Note (Signed)
Pap at next visit - Declined flu shots - Declined Zostavax

## 2016-05-18 NOTE — Assessment & Plan Note (Signed)
Bilateral rash under breasts potentially due to yeast given location and area that is moist chronically. - Hygiene precautions given, counseled to keep the area clean and dry - Will give nystatin powder to take for rash - Follow as needed

## 2016-05-18 NOTE — Progress Notes (Signed)
Subjective:    Patient ID: Megan Miller, female    DOB: 14-Mar-1956, 60 y.o.   MRN: [id number]   CC: back sweating and itching under breasts  HPI: 60 y/o for back sweating and itching and rash under her breasts  Sweating - She feels that when she gets warm she sweats too much - this has been ongoing for several months - denies night sweats - Over the last 3 months she has had itching under her breasts - she has tried nystatin cream under her breasts with no improvement - otherwise   Smoking status reviewed  Review of Systems  Per HPI, else denies recent illness, fever, chest pain, shortness of breath, abdominal pain, N/V/D    Objective:  BP 128/60   Pulse 76   Temp 97.9 F (36.6 C) (Oral)   Wt 194 lb (88 kg)   SpO2 99%   BMI 36.66 kg/m  Vitals and nursing note reviewed  General: NAD Cardiac: RRR Respiratory: CTAB, normal effort Extremities: no edema or cyanosis. Skin:  Moist skin under bilateral breasts with mild erythema, no bumps or lesions Neuro: alert and oriented   Assessment & Plan:    Healthcare maintenance Pap at next visit - Declined flu shots - Declined Zostavax  Rash and nonspecific skin eruption Bilateral rash under breasts potentially due to yeast given location and area that is moist chronically. - Hygiene precautions given, counseled to keep the area clean and dry - Will give nystatin powder to take for rash - Follow as needed    Duanna Runk A. Kennon Rounds MD, MS Family Medicine Resident PGY-3 Pager [phone]

## 2016-06-26 ENCOUNTER — Telehealth: Payer: Self-pay | Admitting: Student

## 2016-06-26 NOTE — Telephone Encounter (Signed)
Pt would like a note excusing her from jury duty because is on disability. Please advise. Thanks! ep

## 2016-06-27 ENCOUNTER — Other Ambulatory Visit: Payer: Self-pay | Admitting: *Deleted

## 2016-06-27 MED ORDER — NYSTATIN 100000 UNIT/GM EX POWD: Freq: Four times a day (QID) | CUTANEOUS | 0 refills | Status: DC

## 2016-06-27 NOTE — Telephone Encounter (Signed)
Will forward to MD to advise. Jazmin Hartsell,CMA  

## 2016-06-27 NOTE — Telephone Encounter (Signed)
Letter pended. Please print and give to patient

## 2016-06-27 NOTE — Telephone Encounter (Signed)
Spoke with patient and she is aware that letter is ready for pick up. Felix Meras,CMA

## 2016-07-05 ENCOUNTER — Telehealth: Payer: Self-pay | Admitting: Student

## 2016-07-05 NOTE — Telephone Encounter (Signed)
Pt came by and said that the note for Megan Miller will not be good enough. The need specifics on her disabilities . jw

## 2016-07-16 NOTE — Telephone Encounter (Signed)
Calling in again about her jury duty letter. It needs to be more detailed. Megan Miller, CMA

## 2016-07-17 NOTE — Telephone Encounter (Signed)
3rd request. ep °

## 2016-07-18 ENCOUNTER — Telehealth: Payer: Self-pay | Admitting: Student

## 2016-07-18 NOTE — Telephone Encounter (Signed)
Informed CMA for Dr. Kennon RoundsHaney to make her aware .

## 2016-07-18 NOTE — Telephone Encounter (Signed)
Patient is aware that letter is ready for pick up. Jazmin Hartsell,CMA  

## 2016-07-18 NOTE — Telephone Encounter (Signed)
Jury duty excuse Letter written. Please print at leave at front desk for pateint

## 2016-07-18 NOTE — Telephone Encounter (Signed)
4th request, patient would like to hear back from MD today.

## 2016-07-18 NOTE — Telephone Encounter (Signed)
Please ask patient exactly what disability she requests leave from jury duty for

## 2016-07-18 NOTE — Telephone Encounter (Signed)
Spoke with patient, she stated she has hypertension and cardiac issues, she says use that disability for her leave from jury duty. Megan Miller, CMA

## 2016-07-19 ENCOUNTER — Telehealth: Payer: Self-pay | Admitting: Student

## 2016-07-19 NOTE — Telephone Encounter (Signed)
Patient was again called. She dit not yet come to get the requested letter but plans to come today. The desired contents of the letter were confirmed with her.Letter left at front for her to pick up

## 2016-07-19 NOTE — Telephone Encounter (Signed)
The patient was called regarding jury duty excuse note, She did not answer so a message was left asking her to call if she has any issues with the note that was provided

## 2016-07-20 NOTE — Telephone Encounter (Signed)
Patient called to see if we still had the original jury summons that she brought here for provider to use a reference for her note.  Informed patient that I checked the provider's box, our outgoing box for patient's and also called provider to check on this.  Provider stated that she never received this form and I didn't see form in any of the other locations noted.  Patient is aware and knows that I will call her if I find it. Megan Miller. Jazmin Hartsell,CMA

## 2016-09-11 ENCOUNTER — Encounter: Payer: Self-pay | Admitting: Family Medicine

## 2016-09-11 ENCOUNTER — Ambulatory Visit (INDEPENDENT_AMBULATORY_CARE_PROVIDER_SITE_OTHER): Payer: Medicaid Other | Admitting: Family Medicine

## 2016-09-11 VITALS — BP 112/60 | HR 77 | Temp 98.0°F | Ht 61.0 in | Wt 190.8 lb

## 2016-09-11 DIAGNOSIS — J069 Acute upper respiratory infection, unspecified: Secondary | ICD-10-CM | POA: Diagnosis not present

## 2016-09-11 DIAGNOSIS — B9789 Other viral agents as the cause of diseases classified elsewhere: Secondary | ICD-10-CM

## 2016-09-11 MED ORDER — GUAIFENESIN-DM 100-10 MG/5ML PO SYRP: 5.0000 mL | ORAL_SOLUTION | ORAL | 0 refills | Status: AC | PRN

## 2016-09-11 MED ORDER — IPRATROPIUM BROMIDE 0.06 % NA SOLN: 2.0000 | Freq: Four times a day (QID) | NASAL | 0 refills | Status: DC

## 2016-09-11 NOTE — Progress Notes (Signed)
   Subjective:  Megan Miller is a 61 y.o. female who [facility] with a chief complaint of cough.   HPI:  Cough Symptoms started about 6 days. Associated symptoms include rhinorrhea, sore throat, sneeze, and subjective fevers. Cough is productive of clear phlegm. No shortness of breath. Had a close friend that had similar symptoms last week. Also with poor appetite. No medications tried.   ROS: Per HPI  Objective:  Physical Exam: BP 112/60   Pulse 77   Temp 98 F (36.7 C) (Oral)   Ht 5\' 1"  (1.549 m)   Wt 190 lb 12.8 oz (86.5 kg)   SpO2 98%   BMI 36.05 kg/m   Gen: NAD, resting comfortably HEENT: TMs clear bilaterally. OP clear. Nasal turbinates boggy and erythematous bilaterally with scant amount of clear discharge.  CV: RRR with no murmurs appreciated Pulm: NWOB, upper airway sounds transmitted. No crackles or wheezes.  MSK: no edema, cyanosis, or clubbing noted Skin: warm, dry Neuro: grossly normal, moves all extremities Psych: Normal affect and thought content  Assessment/Plan:  Viral URI with Cough No signs of bacterial infection. Will treat symptomatically with atrovent nasal spray and robutissin cough syrup. Discussed typical length of illness. Encouraged tylenol as needed for pain and fever. Encouraged PO hydration. Return precautions reviewed. Follow up as needed.   Katina Degreealeb M. Jimmey RalphParker, MD [facility] Resident PGY-3 09/11/2016 9:28 AM

## 2016-09-11 NOTE — Patient Instructions (Signed)
Upper Respiratory Infection, Adult Most upper respiratory infections (URIs) are caused by a virus. A URI affects the nose, throat, and upper air passages. The most common type of URI is often called "the common cold." Follow these instructions at home:  Take medicines only as told by your doctor.  Gargle warm saltwater or take cough drops to comfort your throat as told by your doctor.  Use a warm mist humidifier or inhale steam from a shower to increase air moisture. This may make it easier to breathe.  Drink enough fluid to keep your pee (urine) clear or pale yellow.  Eat soups and other clear broths.  Have a healthy diet.  Rest as needed.  Go back to work when your fever is gone or your doctor says it is okay.  You may need to stay home longer to avoid giving your URI to others.  You can also wear a face mask and wash your hands often to prevent spread of the virus.  Use your inhaler more if you have asthma.  Do not use any tobacco products, including cigarettes, chewing tobacco, or electronic cigarettes. If you need help quitting, ask your doctor. Contact a doctor if:  You are getting worse, not better.  Your symptoms are not helped by medicine.  You have chills.  You are getting more short of breath.  You have brown or red mucus.  You have yellow or brown discharge from your nose.  You have pain in your face, especially when you bend forward.  You have a fever.  You have puffy (swollen) neck glands.  You have pain while swallowing.  You have white areas in the back of your throat. Get help right away if:  You have very bad or constant:  Headache.  Ear pain.  Pain in your forehead, behind your eyes, and over your cheekbones (sinus pain).  Chest pain.  You have long-lasting (chronic) lung disease and any of the following:  Wheezing.  Long-lasting cough.  Coughing up blood.  A change in your usual mucus.  You have a stiff neck.  You have  changes in your:  Vision.  Hearing.  Thinking.  Mood. This information is not intended to replace advice given to you by your health care provider. Make sure you discuss any questions you have with your health care provider. Document Released: 01/02/2008 Document Revised: 03/18/2016 Document Reviewed: 10/21/2013 Elsevier Interactive Patient Education  2017 Elsevier Inc.  

## 2016-09-19 IMAGING — CT CT HEAD W/O CM
2 series · 16 of 30 positions shown, 20 images · non-contrast
Comparison: None

CLINICAL DATA: Frontal headache since [REDACTED].  Possible seizure.

EXAM:
CT HEAD WITHOUT CONTRAST
TECHNIQUE: Contiguous axial images were obtained from the base of the skull
through the vertex without contrast.

[Series 201: head w/o, idose (1) · axial · non-contrast · 0.39mm/px · z∈[+62,+182]mm · 13 of 29 slices shown, 17 images]
[im 3/29  brain]
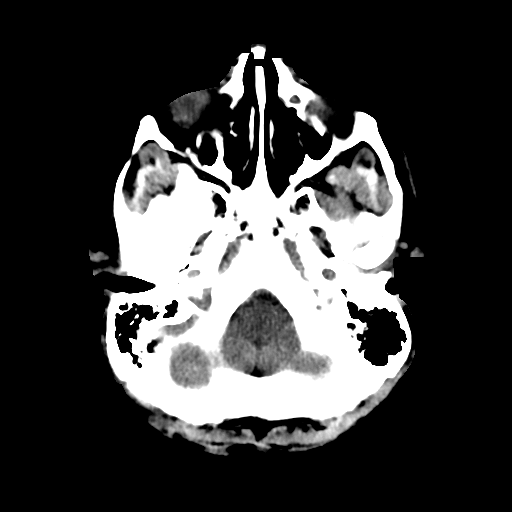
[im 3/29  bone]
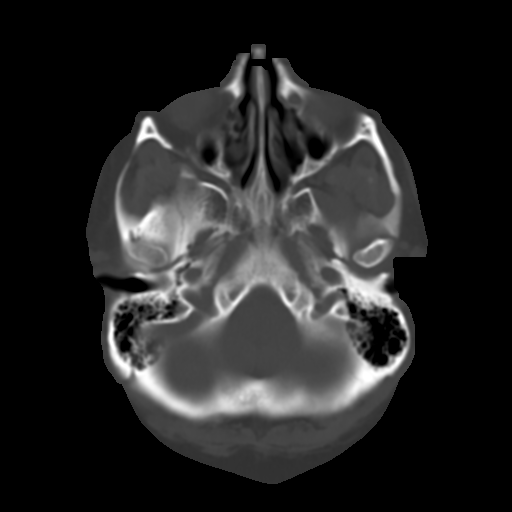
[im 5/29  brain]
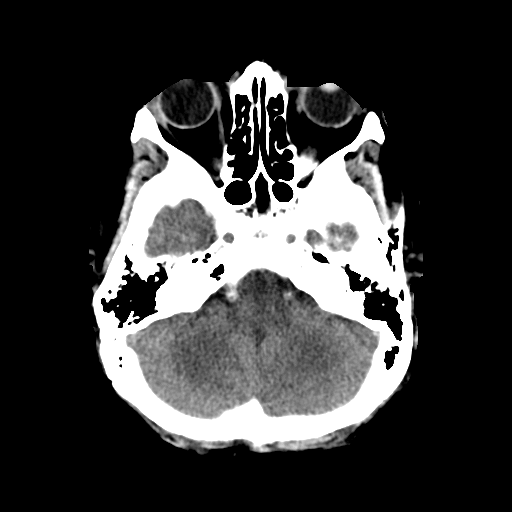
[im 7/29  brain]
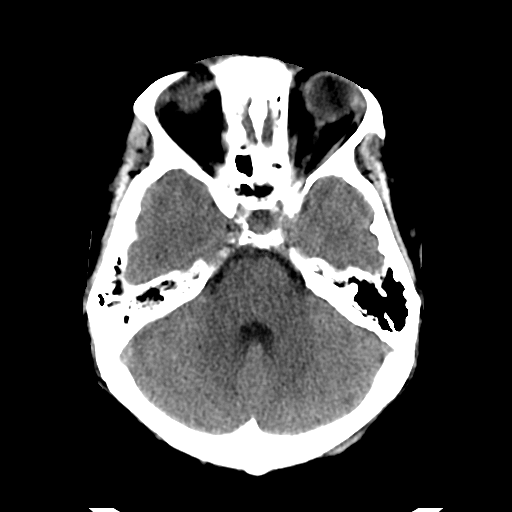
[im 9/29  brain]
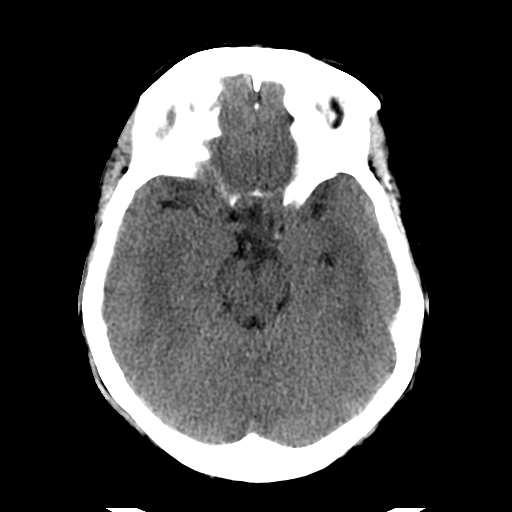
[im 11/29  brain]
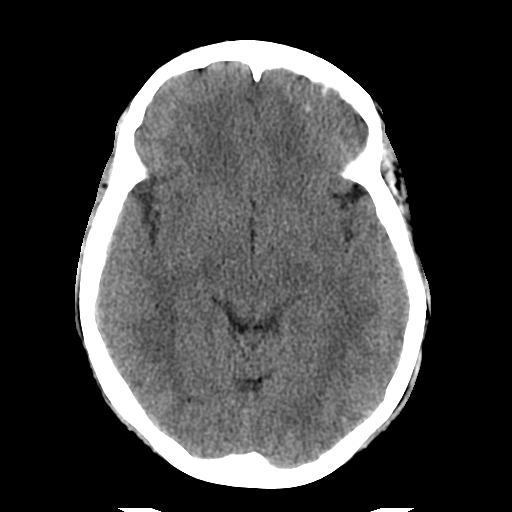
[im 11/29  bone]
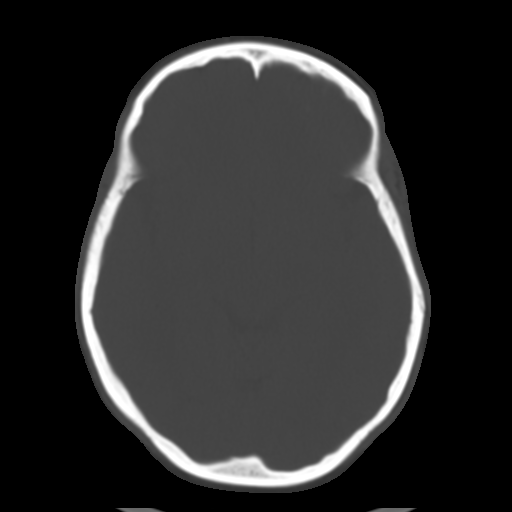
[im 13/29  brain]
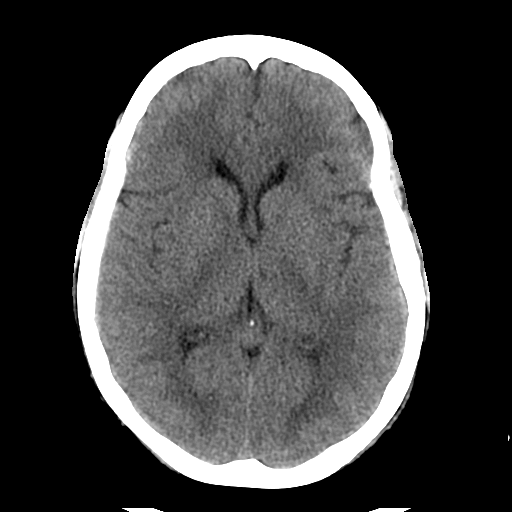
[im 15/29  brain]
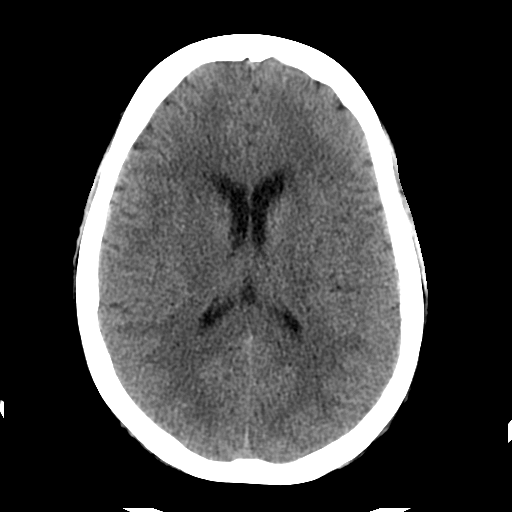
[im 17/29  brain]
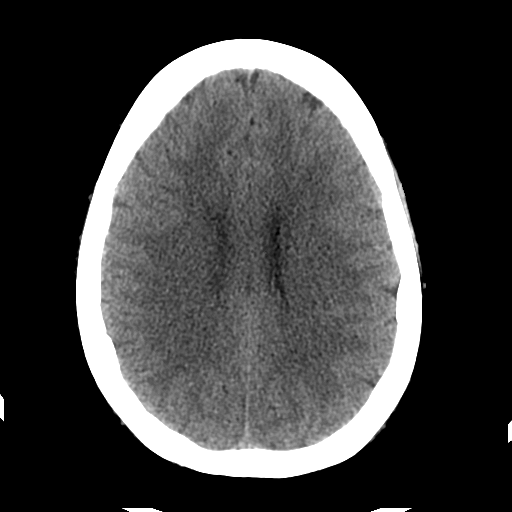
[im 19/29  brain]
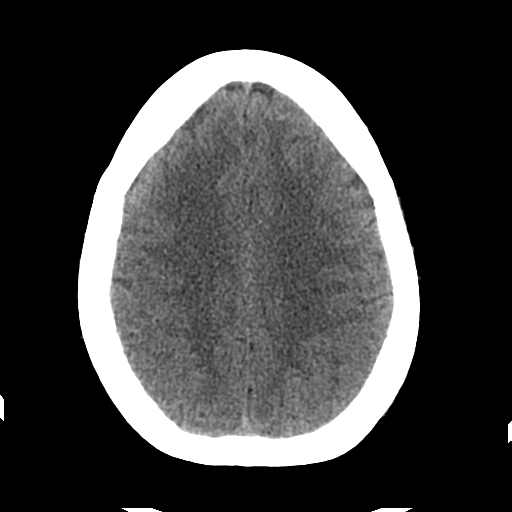
[im 19/29  bone]
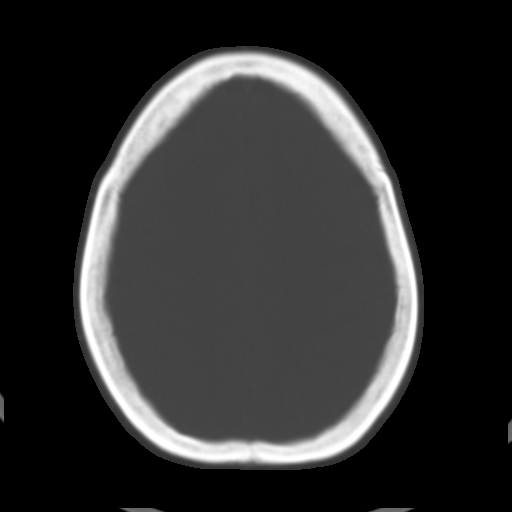
[im 21/29  brain]
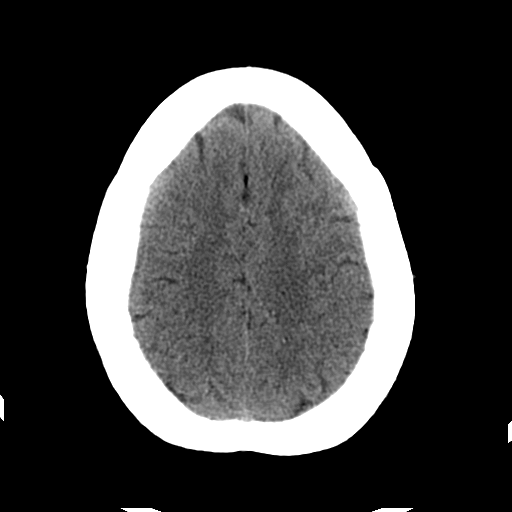
[im 23/29  brain]
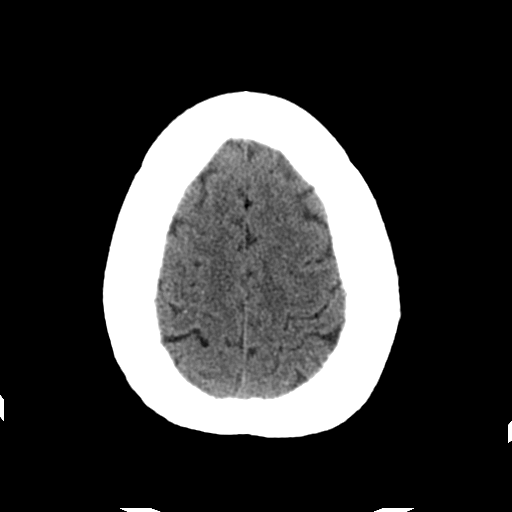
[im 25/29  brain]
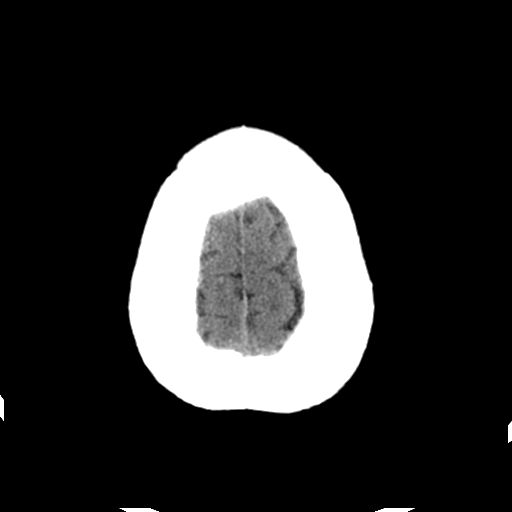
[im 27/29  brain]
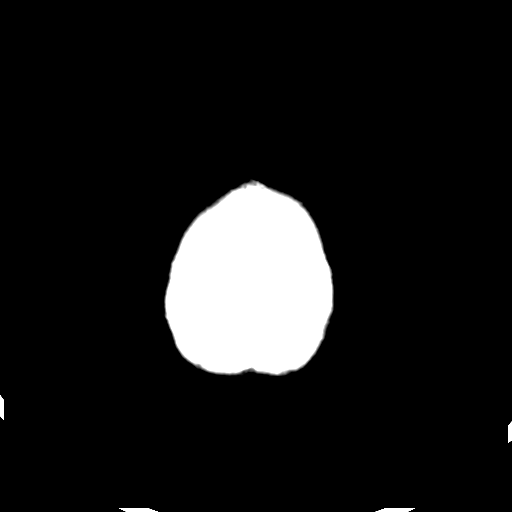
[im 27/29  bone]
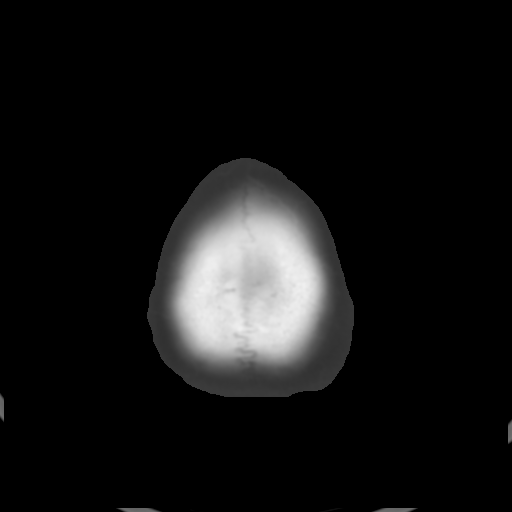

[Series 202: head w/o bone, idose (1) · axial · non-contrast · 0.39mm/px · z∈[+62,+102]mm · 3 of 29 slices shown]
[im 3/29  bone]
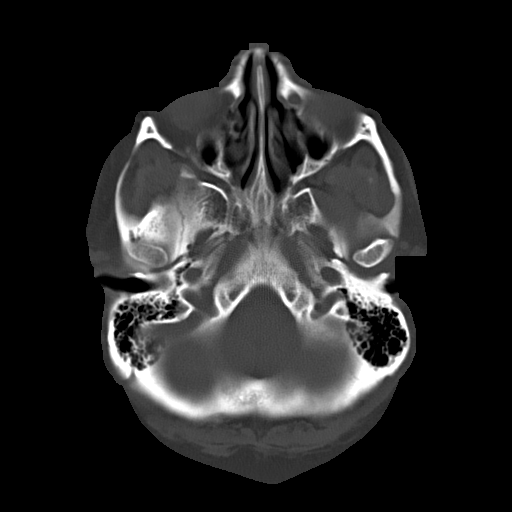
[im 7/29  bone]
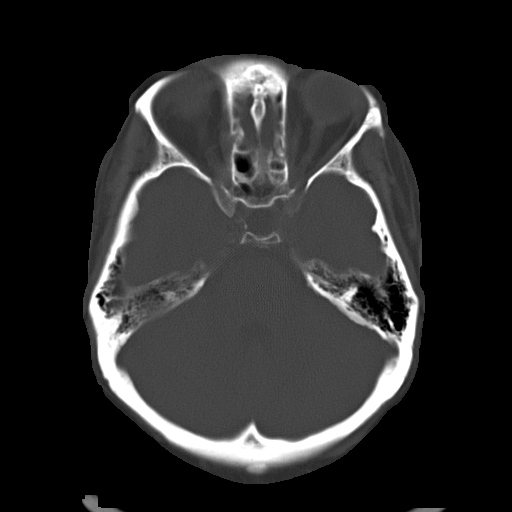
[im 11/29  bone]
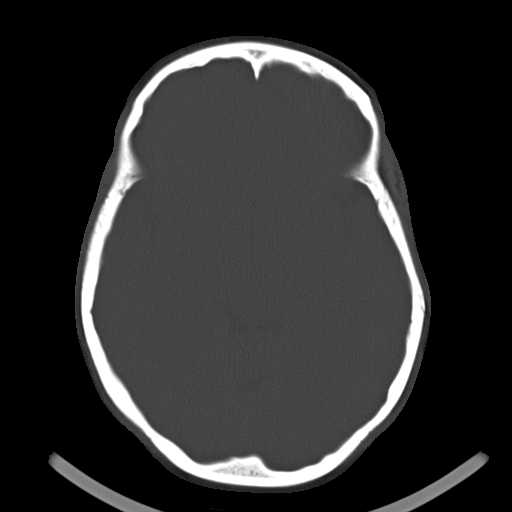

[16 of 30 positions shown; findings below may reference images not displayed]

FINDINGS: Few punctate high-density areas along the left frontal lobe on
images 11 and 12. This is probably artifact. No other evidence for
subarachnoid blood or intracranial hemorrhage. No evidence for mass
lesion, midline shift hydrocephalus or large infarct. Frontal
sinuses are aplastic. Visualized paranasal sinuses are clear. No
calvarial fracture.
IMPRESSION: No acute intracranial abnormality. A few punctate densities along
the left frontal lobe as described. Unless the patient has a history
of trauma, this is probably artifact.

## 2016-09-25 ENCOUNTER — Telehealth: Payer: Self-pay | Admitting: Student

## 2016-09-25 NOTE — Telephone Encounter (Signed)
Pt is calling because she is still coughing after finishing the medication and was told to call back and let her doctor know and they would call in something today for her. jw

## 2016-09-26 MED ORDER — IPRATROPIUM BROMIDE 0.06 % NA SOLN: 2.0000 | Freq: Four times a day (QID) | NASAL | 0 refills | Status: DC

## 2016-09-26 NOTE — Telephone Encounter (Signed)
Patient was called regarding her concern for cough. She did not answer so a message was left. It seems she wants a refill for atrovent, so this will be sent to her pharmacy.

## 2016-09-27 ENCOUNTER — Encounter: Payer: Self-pay | Admitting: Student

## 2016-09-27 ENCOUNTER — Ambulatory Visit (INDEPENDENT_AMBULATORY_CARE_PROVIDER_SITE_OTHER): Payer: Medicaid Other | Admitting: Student

## 2016-09-27 DIAGNOSIS — J069 Acute upper respiratory infection, unspecified: Secondary | ICD-10-CM | POA: Diagnosis present

## 2016-09-27 DIAGNOSIS — B9789 Other viral agents as the cause of diseases classified elsewhere: Secondary | ICD-10-CM

## 2016-09-27 DIAGNOSIS — Z23 Encounter for immunization: Secondary | ICD-10-CM | POA: Diagnosis not present

## 2016-09-27 NOTE — Progress Notes (Signed)
Subjective:    Patient ID: Megan Miller, female    DOB: 1956-06-01, 61 y.o.   MRN: [id number]   CC: continued cough cold  HPI: 61 y/o F presents for contnued to cough with cold  Cold - she was seen two weeks ago for cold and started on atrovent nasal spray and robutussin - the majority of her cold symptoms have resolved but she continues to have cough although it is improving - she denies SOB or chest pain - she has not had fever - she completed all of the medication she was given at her last visit and requests more medications for cough  Smoking status reviewed  Review of Systems  Per HPI   Objective:  BP 124/80   Pulse 83   Temp 98.2 F (36.8 C) (Oral)   Ht 5\' 1"  (1.549 m)   Wt 191 lb (86.6 kg)   SpO2 98%   BMI 36.09 kg/m  Vitals and nursing note reviewed  General: NAD Cardiac: RRR, Respiratory: CTAB, normal effort Skin: warm and dry, no rashes noted Neuro: alert and oriented, no focal deficits   Assessment & Plan:    Viral URI with cough History and physicl exam consistent with viral URI that is now improving - will recommend alka seltzer cold and flu for cold symptoms - atrovent nasal spray already sent to pharmacy over the phone - follow up as needed    Samantha Ragen A. Kennon Rounds MD, MS Family Medicine Resident PGY-3 Pager [phone]

## 2016-09-27 NOTE — Patient Instructions (Addendum)
Follow up as needed Alka Seltzer cold and flu for cold symptoms Call if questions or concerns

## 2016-09-27 NOTE — Assessment & Plan Note (Signed)
History and physicl exam consistent with viral URI that is now improving - will recommend alka seltzer cold and flu for cold symptoms - atrovent nasal spray already sent to pharmacy over the phone - follow up as needed

## 2016-09-28 DIAGNOSIS — Z23 Encounter for immunization: Secondary | ICD-10-CM | POA: Diagnosis not present

## 2016-11-16 ENCOUNTER — Other Ambulatory Visit: Payer: Self-pay | Admitting: *Deleted

## 2016-11-17 MED ORDER — IBUPROFEN 800 MG PO TABS
800.0000 mg | ORAL_TABLET | Freq: Three times a day (TID) | ORAL | 2 refills | Status: DC | PRN
Start: 2016-11-17 — End: 2017-04-04

## 2016-12-17 ENCOUNTER — Other Ambulatory Visit: Payer: Self-pay | Admitting: Student

## 2016-12-17 NOTE — Telephone Encounter (Signed)
Pt calling to request refill of:  Name of Medication(s):  Aciphex   Last date of OV:  09-27-16 Pharmacy:  [facility]   Will route refill request to Clinic RN.  Discussed with patient policy to call pharmacy for future refills.  Also, discussed refills may take up to 48 hours to approve or deny.  Markus JarvisEmily C Pittman

## 2016-12-19 MED ORDER — ACIPHEX 20 MG PO TBEC: 20.0000 mg | DELAYED_RELEASE_TABLET | Freq: Every day | ORAL | 0 refills | Status: DC

## 2016-12-20 ENCOUNTER — Telehealth: Payer: Self-pay | Admitting: *Deleted

## 2016-12-20 ENCOUNTER — Telehealth: Payer: Self-pay | Admitting: Student

## 2016-12-20 NOTE — Telephone Encounter (Signed)
Patient was called regarding her request for Aciphex. Her insurance will not cover it but will likely cover another medication of the same class, Protonix. She did not answer her phone so a voicemail was left informing her of this and asking her to call back and state whether she is in agreement

## 2016-12-20 NOTE — Telephone Encounter (Signed)
[facility] stating Aciphex DR 20 mg need to be documented as brand medically necessary for Medicaid to cover.  Please call with questions [phone] or fax [phone].  Clovis PuMartin, Tamika L, RN

## 2016-12-21 NOTE — Telephone Encounter (Signed)
Pt called back, she will try protonix. Will forward to MD Fleeger, [name], CMA

## 2016-12-25 ENCOUNTER — Telehealth: Payer: Self-pay | Admitting: *Deleted

## 2016-12-25 NOTE — Telephone Encounter (Signed)
Prior Authorization [facility] for Acephex 20 mg. PA approved [place] [employer] until 12/25/17. Approval number: [id number].   Clovis PuMartin, Samier Jaco L, RN

## 2016-12-26 ENCOUNTER — Other Ambulatory Visit: Payer: Self-pay | Admitting: *Deleted

## 2016-12-26 DIAGNOSIS — I10 Essential (primary) hypertension: Secondary | ICD-10-CM

## 2016-12-26 MED ORDER — LISINOPRIL-HYDROCHLOROTHIAZIDE 20-12.5 MG PO TABS: 1.0000 | ORAL_TABLET | Freq: Every day | ORAL | 11 refills | Status: DC

## 2017-01-10 ENCOUNTER — Encounter: Payer: Self-pay | Admitting: Family Medicine

## 2017-01-10 ENCOUNTER — Ambulatory Visit (INDEPENDENT_AMBULATORY_CARE_PROVIDER_SITE_OTHER): Payer: Medicaid Other | Admitting: Family Medicine

## 2017-01-10 DIAGNOSIS — T304 Corrosion of unspecified body region, unspecified degree: Secondary | ICD-10-CM | POA: Diagnosis present
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
## 2017-01-11 ENCOUNTER — Other Ambulatory Visit: Payer: Self-pay | Admitting: Student

## 2017-01-11 NOTE — Telephone Encounter (Signed)
I'm sorry but there is no cream for this. She can try some OTC aloe gel/lotion, but time will be the only solution for this.

## 2017-01-11 NOTE — Telephone Encounter (Signed)
Patient informed, expressed understanding. 
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
## 2017-01-11 NOTE — Telephone Encounter (Signed)
Will forward to PCP and Dr. Wende MottMcKeag.  Clovis PuMartin, Adal Sereno L, RN

## 2017-01-18 ENCOUNTER — Telehealth: Payer: Self-pay | Admitting: Student

## 2017-01-18 DIAGNOSIS — M25562 Pain in left knee: Principal | ICD-10-CM

## 2017-01-18 DIAGNOSIS — G8929 Other chronic pain: Secondary | ICD-10-CM

## 2017-01-18 NOTE — Telephone Encounter (Signed)
Pt wants to know what kind of arthritis she has. Pt also needs a referral for ortho for Dr. Dion SaucierLandau, pt had knee surgery a few years ago on left knee and is having pain now. ep

## 2017-01-18 NOTE — Telephone Encounter (Signed)
Called pt to inform- pt did not answer. A VM was left to call the office. Please inform her a referral has been placed when she calls back.

## 2017-01-18 NOTE — Telephone Encounter (Signed)
Please call the patient and tell her that she does not have a diagnosis of arthritis. I see she has been seen for left knee pain in 2014 but not since. As she has been seen by orthopedic surgery in the past for this the referral will be placed

## 2017-01-21 ENCOUNTER — Other Ambulatory Visit: Payer: Self-pay | Admitting: Internal Medicine

## 2017-01-21 ENCOUNTER — Other Ambulatory Visit: Payer: Self-pay | Admitting: *Deleted

## 2017-01-21 ENCOUNTER — Telehealth: Payer: Self-pay | Admitting: Student

## 2017-01-21 MED ORDER — ACIPHEX 20 MG PO TBEC: 20.0000 mg | DELAYED_RELEASE_TABLET | Freq: Every day | ORAL | 0 refills | Status: DC

## 2017-01-21 NOTE — Telephone Encounter (Signed)
As documented on 6/22 she does not cary a diagnosis of arthritis, but a referral was placed to orthopedic surgery given her history of orthopedic surgery. The provider was not specified in the referral order so it is not clear who will take her as a pateint

## 2017-01-21 NOTE — Telephone Encounter (Signed)
Will forward to MD to advise and also referral coordinator. Taccara Bushnell,CMA

## 2017-01-21 NOTE — Telephone Encounter (Signed)
[facility] She saw him several years ago and doesn't want to go back. She wants another referral to another dr. Please advise. The cream for her hand isn't doing anything. She wants to see a hand dr.  She wants to know what kind of arthritis she has?

## 2017-01-22 NOTE — Telephone Encounter (Signed)
In the telephone message from 01/18/2017 it specifically states that patient wanted a referral to see Dr. Dion SaucierLandau. That is [facility]

## 2017-01-22 NOTE — Telephone Encounter (Signed)
LM for patient to call back and schedule an appt to reevaluate her hand concerns.  Also I informed her that she can contact [facility] and see if she is able to see another provider. Arya Boxley,CMA

## 2017-02-08 ENCOUNTER — Telehealth: Payer: Self-pay | Admitting: Internal Medicine

## 2017-02-08 NOTE — Telephone Encounter (Signed)
Patient called requesting a medication for her hot flashes. Would not recommend hormone replacement therapy for this patient because of her age and history of stable angina. Think the risks outweigh the benefits. Recommended that patient try black cohosh. Patient voiced understanding. If this does not work, could try an SSRI/SNRI.  Willadean CarolKaty Basma Buchner, MD PGY-3

## 2017-02-08 NOTE — Telephone Encounter (Signed)
Will forward to MD to advise. Taksh Hjort,CMA  

## 2017-02-08 NOTE — Telephone Encounter (Signed)
Pt would like to know if dr can prescribe something for her hot flashes

## 2017-02-12 ENCOUNTER — Telehealth: Payer: Self-pay | Admitting: Internal Medicine

## 2017-02-12 ENCOUNTER — Ambulatory Visit (INDEPENDENT_AMBULATORY_CARE_PROVIDER_SITE_OTHER): Payer: Self-pay | Admitting: Orthopaedic Surgery

## 2017-02-12 NOTE — Telephone Encounter (Signed)
Pt is calling because she started taking black cohosh and now she has red bumps all over her legs. Please call to discuss. jw

## 2017-02-12 NOTE — Telephone Encounter (Signed)
Patient has an appointment to see PCP tomorrow to discuss this concern. Megan Miller,CMA

## 2017-02-12 NOTE — Telephone Encounter (Signed)
LM for patient to call back.  She will need to stop taking the black cohosh and see Dr. Nancy MarusMayo this week for a same day appointment for her rash.

## 2017-02-13 ENCOUNTER — Encounter: Payer: Self-pay | Admitting: Internal Medicine

## 2017-02-13 ENCOUNTER — Ambulatory Visit (INDEPENDENT_AMBULATORY_CARE_PROVIDER_SITE_OTHER): Payer: Medicaid Other | Admitting: Internal Medicine

## 2017-02-13 VITALS — BP 99/58 | HR 85 | Temp 98.4°F | Wt 186.0 lb

## 2017-02-13 DIAGNOSIS — W57XXXA Bitten or stung by nonvenomous insect and other nonvenomous arthropods, initial encounter: Secondary | ICD-10-CM

## 2017-02-13 DIAGNOSIS — S80862A Insect bite (nonvenomous), left lower leg, initial encounter: Secondary | ICD-10-CM

## 2017-02-13 DIAGNOSIS — S80861A Insect bite (nonvenomous), right lower leg, initial encounter: Secondary | ICD-10-CM

## 2017-02-13 MED ORDER — TRIAMCINOLONE ACETONIDE 0.1 % EX OINT: 1.0000 "application " | TOPICAL_OINTMENT | Freq: Two times a day (BID) | CUTANEOUS | 0 refills | Status: DC

## 2017-02-13 NOTE — Patient Instructions (Signed)
It was so nice to meet you!  I have prescribed a steroid ointment called Triamcinolone for your bug bites. Please use this twice a day until the bug bites get better.  This does not look like a drug rash to me, so you can continue to take the [employer] Coh[employer].  -Dr. Nancy MarusMayo

## 2017-02-14 ENCOUNTER — Encounter (INDEPENDENT_AMBULATORY_CARE_PROVIDER_SITE_OTHER): Payer: Self-pay | Admitting: Orthopaedic Surgery

## 2017-02-14 ENCOUNTER — Ambulatory Visit (INDEPENDENT_AMBULATORY_CARE_PROVIDER_SITE_OTHER): Payer: Medicaid Other | Admitting: Orthopaedic Surgery

## 2017-02-14 DIAGNOSIS — T304 Corrosion of unspecified body region, unspecified degree: Secondary | ICD-10-CM | POA: Diagnosis not present

## 2017-02-14 NOTE — Progress Notes (Signed)
   [facility] Phone: [phone]  Subjective:  Megan Miller is a 61 year old female presenting to clinic with rash on her legs. She started taking Black Cohosh for hot flashes two days ago, which is the same day the rash began. She thinks the rash is probably an allergic reaction from the black cohosh. The rash is located on both legs. The rash is itchy. She also notes that she was outside 2 days ago working on her flowers. No fevers, no chills, no shortness of breath, no throat swelling.  ROS: See HPI for pertinent positives and negatives  Past Medical History- hypertension, depression, stable angina  Family history reviewed for today's visit. No changes.  Social history- patient is a never smoker  Objective: BP (!) 99/58   Pulse 85   Temp 98.4 F (36.9 C) (Oral)   Wt 186 lb (84.4 kg)   SpO2 99%   BMI 35.14 kg/m  Gen: NAD, alert, cooperative with exam Skin: Three round, erythematous, edematous nodules present on the right calf and two nodules present on the left calf. No spreading redness, no drainage.  Assessment/Plan: Insect Bites: Patient with 5 nodules present on the lower extremities, consistent with bug bites that have an exaggerated inflammatory response. Occurred after she was outside in her garden. Also happened to occur on the same day that she started black cohosh for hot flashes, but appearance is not consistent with drug rash. - Reassurance provided. Okay to continue black cohosh. - Prescribed Triamcinolone ointment to help with itching. - Return precautions discussed - Follow-up if no improvement   Willadean CarolKaty Wolfgang Finigan, MD PGY-3
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
## 2017-02-15 DIAGNOSIS — W57XXXA Bitten or stung by nonvenomous insect and other nonvenomous arthropods, initial encounter: Secondary | ICD-10-CM | POA: Insufficient documentation

## 2017-02-15 NOTE — Assessment & Plan Note (Signed)
Patient with 5 nodules present on the lower extremities, consistent with bug bites that have an exaggerated inflammatory response. Occurred after she was outside in her garden. Also happened to occur on the same day that she started black cohosh for hot flashes, but appearance is not consistent with drug rash. - Reassurance provided. Okay to continue black cohosh. - Prescribed Triamcinolone ointment to help with itching. - Return precautions discussed - Follow-up if no improvement

## 2017-02-18 ENCOUNTER — Telehealth: Payer: Self-pay | Admitting: Internal Medicine

## 2017-02-18 NOTE — Telephone Encounter (Signed)
Pt is calling because she is still breaking out from the medication black cohosh. She has stopped taking this. Please let her know what else she should do. jw

## 2017-02-18 NOTE — Telephone Encounter (Signed)
Will forward to MD to advise. Jazmin Hartsell,CMA  

## 2017-02-19 NOTE — Telephone Encounter (Signed)
Pt returned Dr. Enriqueta ShutterMayo's call. I told pt that Dr. Nancy MarusMayo was seeing pt's but I would let her know that her call was returned. Dr. Haywood PaoMayo,please call pt on [phone] when you can. Sunday SpillersSharon T Brennden Masten, CMA

## 2017-02-19 NOTE — Telephone Encounter (Signed)
Returned patients call. All questions answered.

## 2017-02-19 NOTE — Telephone Encounter (Signed)
Attempted to call patient back. Left a voicemail.

## 2017-02-25 ENCOUNTER — Other Ambulatory Visit: Payer: Self-pay | Admitting: Internal Medicine

## 2017-02-25 ENCOUNTER — Telehealth: Payer: Self-pay | Admitting: Internal Medicine

## 2017-02-25 MED ORDER — ACIPHEX 20 MG PO TBEC: 20.0000 mg | DELAYED_RELEASE_TABLET | Freq: Every day | ORAL | 0 refills | Status: DC

## 2017-02-25 MED ORDER — CARVEDILOL 25 MG PO TABS: 25.0000 mg | ORAL_TABLET | Freq: Two times a day (BID) | ORAL | 5 refills | Status: DC

## 2017-02-25 NOTE — Telephone Encounter (Signed)
Patient is calling to request refill of:  Name of Medication(s):  Carvedilol and Aciphex  Last date of OV:   Pharmacy:  [facility] on [address]  Will route refill request to Clinic RN.  Discussed with patient policy to call pharmacy for future refills.  Also, discussed refills may take up to 48 hours to approve or deny.  Caren MacadamJacqueline L Warner

## 2017-02-25 NOTE — Telephone Encounter (Signed)
Refills sent to patient's pharmacy. Thanks!

## 2017-02-28 NOTE — Progress Notes (Signed)
    Subjective:  Megan Miller is a 61 y.o. female who presents to the [facility] today with a chief complaint of rash.   HPI:  RASH  Had rash for 3 days Location: left wrist, lower back and left leg Medications tried: kenalog 0.1% Similar rash in past: no New medications or antibiotics: black cohosh Tick, Insect or new pet exposure: no Recent travel: no New detergent or soap: no Immunocompromised: no  Symptoms Itching: yes Pain over rash: no Feeling ill all over: no Fever: no Mouth sores: no Face or tongue swelling: no Trouble breathing: no Joint swelling or pain: no  Review of Symptoms - see HPI PMH - Smoking status noted.       Objective:  Physical Exam: BP 100/62 (BP Location: Left Arm, Patient Position: Sitting, Cuff Size: Large)   Pulse 80   Temp 98.1 F (36.7 C) (Oral)   Ht 5\' 1"  (1.549 m)   Wt 186 lb (84.4 kg)   SpO2 99%   BMI 35.14 kg/m   Gen: 61 yo F in NAD, resting comfortably CV: RRR with no murmurs appreciated Pulm: NWOB, CTAB with no crackles, wheezes, or rhonchi GI: Normal bowel sounds present. Soft, Nontender, Nondistended. MSK: no edema, cyanosis, or clubbing noted Skin: warm, dry, single blanching erythematous circular macule approx 0.5cm, no warmth, swelling, drainage or TTP Neuro: grossly normal, moves all extremities  Psych: Normal affect and thought content  No results found for this or any previous visit (from the past 72 hour(s)).   Assessment/Plan:  Bite, insect Presents with single erythematous macule on left wrist and complaints of itching on L leg, lower back and left wrist.  No noticeable rash, swelling, drainage, warmth or TTP and is without fevers, chills, animal or tick exposure. Likely insect bite, but unlikely to be bed bugs. - zyrtec 10mg  daily - continuing previously prescribed kenalog 0.1% BID - recommended examining bed and couch for bed bugs and to follow up as needed

## 2017-03-01 ENCOUNTER — Encounter: Payer: Self-pay | Admitting: Family Medicine

## 2017-03-01 ENCOUNTER — Ambulatory Visit (INDEPENDENT_AMBULATORY_CARE_PROVIDER_SITE_OTHER): Payer: Medicaid Other | Admitting: Family Medicine

## 2017-03-01 VITALS — BP 100/62 | HR 80 | Temp 98.1°F | Ht 61.0 in | Wt 186.0 lb

## 2017-03-01 DIAGNOSIS — S80869A Insect bite (nonvenomous), unspecified lower leg, initial encounter: Secondary | ICD-10-CM

## 2017-03-01 DIAGNOSIS — W57XXXA Bitten or stung by nonvenomous insect and other nonvenomous arthropods, initial encounter: Secondary | ICD-10-CM

## 2017-03-01 DIAGNOSIS — S30860A Insect bite (nonvenomous) of lower back and pelvis, initial encounter: Secondary | ICD-10-CM | POA: Diagnosis not present

## 2017-03-01 DIAGNOSIS — S60862A Insect bite (nonvenomous) of left wrist, initial encounter: Secondary | ICD-10-CM

## 2017-03-01 MED ORDER — CETIRIZINE HCL 10 MG PO TABS: 10.0000 mg | ORAL_TABLET | Freq: Every day | ORAL | 1 refills | Status: DC

## 2017-03-01 NOTE — Patient Instructions (Addendum)
Megan Miller, you were seen today for a rash.  I believe this is due to a bug bite.  I was only able to see one red bump on your left wrist.  This is more likely to be due to mosquitos than bed bugs.  I would continue with zyrtec 10mg  daily (i sent in a refill) and your kenalog ointment that Dr. Nancy MarusMayo prescribed you on 7/18.   I did recommend that you examine your couch and seams of your bed to look for bugs.  If you do notice anything you will likely need to be treated with a different medication.  Please call the office if this is the case.   Very nice seeing you today, [name] [name], MD [facility] Resident PGY-2 03/01/2017 9:45 AM

## 2017-03-01 NOTE — Assessment & Plan Note (Signed)
Presents with single erythematous macule on left wrist and complaints of itching on L leg, lower back and left wrist.  No noticeable rash, swelling, drainage, warmth or TTP and is without fevers, chills, animal or tick exposure. Likely insect bite, but unlikely to be bed bugs. - zyrtec 10mg  daily - continuing previously prescribed kenalog 0.1% BID - recommended examining bed and couch for bed bugs and to follow up as needed

## 2017-03-07 ENCOUNTER — Telehealth: Payer: Self-pay | Admitting: Internal Medicine

## 2017-03-07 NOTE — Telephone Encounter (Signed)
Returned patient call. C/o 3-4 "teeny" red spots on right breast. These are non-pruitic and non tender. Denies fever or any other symptoms. Gave patient option of watching to see if they go away on their own or coming in for SD. Patient opted to watch. Will call office if develops more spots or if they become itchy or sore or any other symptoms develop.  Kinnie FeilL. Alexie Samson, RN, BSN

## 2017-03-07 NOTE — Telephone Encounter (Signed)
Pt would like to speak to the nurse about a red rash on her breast. jw

## 2017-03-22 ENCOUNTER — Other Ambulatory Visit: Payer: Self-pay | Admitting: Internal Medicine

## 2017-03-28 ENCOUNTER — Other Ambulatory Visit: Payer: Self-pay | Admitting: Internal Medicine

## 2017-03-28 DIAGNOSIS — Z1231 Encounter for screening mammogram for malignant neoplasm of breast: Secondary | ICD-10-CM

## 2017-04-01 ENCOUNTER — Other Ambulatory Visit: Payer: Self-pay | Admitting: Internal Medicine

## 2017-04-04 ENCOUNTER — Other Ambulatory Visit: Payer: Self-pay | Admitting: *Deleted

## 2017-04-04 MED ORDER — POLYETHYLENE GLYCOL 3350 17 GM/SCOOP PO POWD: ORAL | 4 refills | Status: DC

## 2017-04-04 MED ORDER — IBUPROFEN 800 MG PO TABS: 800.0000 mg | ORAL_TABLET | Freq: Three times a day (TID) | ORAL | 2 refills | Status: DC | PRN

## 2017-04-05 ENCOUNTER — Other Ambulatory Visit: Payer: Self-pay | Admitting: Internal Medicine

## 2017-04-05 MED ORDER — IBUPROFEN 800 MG PO TABS: 800.0000 mg | ORAL_TABLET | Freq: Three times a day (TID) | ORAL | 2 refills | Status: DC | PRN

## 2017-04-15 ENCOUNTER — Emergency Department (HOSPITAL_COMMUNITY)
Admission: EM | Admit: 2017-04-15 | Discharge: 2017-04-15 | Disposition: A | Discharge status: Home / Self Care | Payer: Medicaid Other | Attending: Emergency Medicine | Admitting: Emergency Medicine

## 2017-04-15 ENCOUNTER — Encounter (HOSPITAL_COMMUNITY): Payer: Self-pay | Admitting: *Deleted

## 2017-04-15 DIAGNOSIS — Z79899 Other long term (current) drug therapy: Secondary | ICD-10-CM | POA: Diagnosis not present

## 2017-04-15 DIAGNOSIS — Z885 Allergy status to narcotic agent status: Secondary | ICD-10-CM | POA: Insufficient documentation

## 2017-04-15 DIAGNOSIS — I10 Essential (primary) hypertension: Secondary | ICD-10-CM | POA: Insufficient documentation

## 2017-04-15 DIAGNOSIS — R21 Rash and other nonspecific skin eruption: Secondary | ICD-10-CM

## 2017-04-15 DIAGNOSIS — Z7982 Long term (current) use of aspirin: Secondary | ICD-10-CM | POA: Diagnosis not present

## 2017-04-15 MED ORDER — PREDNISONE 10 MG PO TABS: 40.0000 mg | ORAL_TABLET | Freq: Every day | ORAL | 0 refills | Status: AC

## 2017-04-15 MED ORDER — DIPHENHYDRAMINE HCL 25 MG PO CAPS: 25.0000 mg | ORAL_CAPSULE | Freq: Four times a day (QID) | ORAL | 0 refills | Status: DC | PRN

## 2017-04-15 NOTE — ED Triage Notes (Signed)
Pt reports rash and itching since Sunday. Rash is on her chest, arms and hands.
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
## 2017-04-15 NOTE — Discharge Instructions (Signed)
Please read attached information regarding your condition. Take prednisone 40 mg for 5 days. Take Benadryl as needed for itching. Return to ED for worsening pain, worsening rash, lip swelling, trouble breathing, fevers, insect bites, drainage or bleeding from site.

## 2017-04-25 ENCOUNTER — Ambulatory Visit
Admission: RE | Admit: 2017-04-25 | Discharge: 2017-04-25 | Disposition: A | Discharge status: Home / Self Care | Payer: Medicaid Other | Source: Ambulatory Visit | Attending: Family Medicine | Admitting: Family Medicine

## 2017-04-25 DIAGNOSIS — Z1231 Encounter for screening mammogram for malignant neoplasm of breast: Secondary | ICD-10-CM

## 2017-04-30 ENCOUNTER — Other Ambulatory Visit: Payer: Self-pay | Admitting: *Deleted

## 2017-04-30 MED ORDER — RABEPRAZOLE SODIUM 20 MG PO TBEC: 20.0000 mg | DELAYED_RELEASE_TABLET | Freq: Every day | ORAL | 0 refills | Status: DC

## 2017-05-02 ENCOUNTER — Other Ambulatory Visit: Payer: Self-pay

## 2017-05-02 ENCOUNTER — Emergency Department (HOSPITAL_COMMUNITY)
Admission: EM | Admit: 2017-05-02 | Discharge: 2017-05-02 | Disposition: A | Discharge status: Home / Self Care | Payer: Medicaid Other | Attending: Emergency Medicine | Admitting: Emergency Medicine

## 2017-05-02 ENCOUNTER — Encounter (HOSPITAL_COMMUNITY): Payer: Self-pay | Admitting: Emergency Medicine

## 2017-05-02 DIAGNOSIS — I10 Essential (primary) hypertension: Secondary | ICD-10-CM | POA: Diagnosis not present

## 2017-05-02 DIAGNOSIS — R42 Dizziness and giddiness: Secondary | ICD-10-CM | POA: Diagnosis present

## 2017-05-02 DIAGNOSIS — Z7982 Long term (current) use of aspirin: Secondary | ICD-10-CM | POA: Diagnosis not present

## 2017-05-02 DIAGNOSIS — N958 Other specified menopausal and perimenopausal disorders: Secondary | ICD-10-CM | POA: Insufficient documentation

## 2017-05-02 DIAGNOSIS — Z79899 Other long term (current) drug therapy: Secondary | ICD-10-CM | POA: Insufficient documentation

## 2017-05-02 DIAGNOSIS — R232 Flushing: Secondary | ICD-10-CM

## 2017-05-02 LAB — CBC
HCT: 38.5 % (ref 36.0–46.0)
Hemoglobin: 11.9 g/dL — ABNORMAL LOW (ref 12.0–15.0)
MCH: 26.9 pg (ref 26.0–34.0)
MCHC: 30.9 g/dL (ref 30.0–36.0)
MCV: 86.9 fL (ref 78.0–100.0)
PLATELETS: 232 10*3/uL (ref 150–400)
RBC: 4.43 MIL/uL (ref 3.87–5.11)
RDW: 14.2 % (ref 11.5–15.5)
WBC: 9.8 10*3/uL (ref 4.0–10.5)

## 2017-05-02 LAB — BASIC METABOLIC PANEL
Anion gap: 12 (ref 5–15)
BUN: 18 mg/dL (ref 6–20)
CO2: 23 mmol/L (ref 22–32)
CREATININE: 1.02 mg/dL — AB (ref 0.44–1.00)
Calcium: 8.9 mg/dL (ref 8.9–10.3)
Chloride: 100 mmol/L — ABNORMAL LOW (ref 101–111)
GFR, EST NON AFRICAN AMERICAN: 58 mL/min — AB (ref >60)
Glucose, Bld: 90 mg/dL (ref 65–99)
POTASSIUM: 3.4 mmol/L — AB (ref 3.5–5.1)
SODIUM: 135 mmol/L (ref 135–145)

## 2017-05-02 LAB — CBG MONITORING, ED: GLUCOSE-CAPILLARY: 90 mg/dL (ref 65–99)

## 2017-05-02 NOTE — ED Notes (Signed)
Pt c/o feeling weak, chills, and blurry vision since yesterday.

## 2017-05-02 NOTE — ED Notes (Signed)
Pt endorses headache this morning which she took aspirin for and states resolved. No complaints at this time.
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
## 2017-05-02 NOTE — Discharge Instructions (Signed)
It was our pleasure to provide your ER care today - we hope that you feel better.  Rest. Drink plenty of fluids.  Follow up with primary care doctor in the next few days.  Return to ER if worse, new symptoms, weak/faint, fevers, chest pain, trouble breathing, other concern.

## 2017-05-02 NOTE — ED Triage Notes (Signed)
Pt to ER states "I've been having hot flashes, I feel fine then I just get hot." hx of same. States incident of blurred vision yesterday that resolved. NAD at triage. Pt poor historian.

## 2017-05-08 ENCOUNTER — Other Ambulatory Visit: Payer: Self-pay | Admitting: Internal Medicine

## 2017-05-09 ENCOUNTER — Encounter: Payer: Self-pay | Admitting: Internal Medicine

## 2017-05-09 ENCOUNTER — Ambulatory Visit (INDEPENDENT_AMBULATORY_CARE_PROVIDER_SITE_OTHER): Payer: Medicaid Other | Admitting: Internal Medicine

## 2017-05-09 VITALS — BP 110/60 | HR 88 | Temp 98.2°F | Ht 61.0 in | Wt 190.0 lb

## 2017-05-09 DIAGNOSIS — Z23 Encounter for immunization: Secondary | ICD-10-CM

## 2017-05-09 DIAGNOSIS — R55 Syncope and collapse: Secondary | ICD-10-CM

## 2017-05-09 MED ORDER — RABEPRAZOLE SODIUM 20 MG PO TBEC: 20.0000 mg | DELAYED_RELEASE_TABLET | Freq: Every day | ORAL | 0 refills | Status: DC

## 2017-05-09 NOTE — Patient Instructions (Signed)
It was so nice to see you!  I am worried that your symptoms are caused by your heart. I have sent in an urgent referral to Cardiology so that they can evaluate you for heart causes of your passing out.  In the meantime, I am strongly recommending that you do not drive, until we have more information.  -Dr. Nancy Marus

## 2017-05-10 ENCOUNTER — Other Ambulatory Visit: Payer: Self-pay | Admitting: Internal Medicine

## 2017-05-10 NOTE — Telephone Encounter (Signed)
Thank you so much for all your help with this!
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
## 2017-05-10 NOTE — Telephone Encounter (Signed)
Called pharmacy yesterday. There has been issue getting medications.  Pt did not inform us that she only wanted namebrand.  Had to get handwritten Rx that stated brand name only and [facility].  Per Sharyl Nimrod they received and it was ap[proved by Medicaid. Kealey Kemmer, [name], CMA

## 2017-05-13 ENCOUNTER — Other Ambulatory Visit: Payer: Self-pay | Admitting: *Deleted

## 2017-05-13 DIAGNOSIS — I10 Essential (primary) hypertension: Secondary | ICD-10-CM

## 2017-05-13 MED ORDER — LISINOPRIL-HYDROCHLOROTHIAZIDE 20-12.5 MG PO TABS: 1.0000 | ORAL_TABLET | Freq: Every day | ORAL | 11 refills | Status: DC

## 2017-05-14 ENCOUNTER — Other Ambulatory Visit: Payer: Self-pay | Admitting: Internal Medicine

## 2017-05-31 ENCOUNTER — Ambulatory Visit (INDEPENDENT_AMBULATORY_CARE_PROVIDER_SITE_OTHER): Payer: Medicaid Other | Admitting: Cardiology

## 2017-05-31 ENCOUNTER — Encounter: Payer: Self-pay | Admitting: Cardiology

## 2017-05-31 VITALS — BP 128/82 | HR 83 | Resp 16 | Ht 61.0 in | Wt 194.1 lb

## 2017-05-31 DIAGNOSIS — I10 Essential (primary) hypertension: Secondary | ICD-10-CM

## 2017-05-31 DIAGNOSIS — R55 Syncope and collapse: Secondary | ICD-10-CM

## 2017-05-31 NOTE — Patient Instructions (Addendum)
Medication Instructions: Your physician recommends that you continue on your current medications as directed. Please refer to the Current Medication list given to you today.  Labwork: Your physician recommends that you have lab work today: TSH and Free T4   Procedures/Testing: Your physician has requested that you have an echocardiogram. Echocardiography is a painless test that uses sound waves to create images of your heart. It provides your doctor with information about the size and shape of your heart and how well your heart's chambers and valves are working. This procedure takes approximately one hour. There are no restrictions for this procedure.  Your physician has recommended that you wear an 30 day event monitor. Event monitors are medical devices that record the heart's electrical activity. Doctors most often us these monitors to diagnose arrhythmias. Arrhythmias are problems with the speed or rhythm of the heartbeat. The monitor is a small, portable device. You can wear one while you do your normal daily activities. This is usually used to diagnose what is causing palpitations/syncope (passing out).  Follow-Up: Your physician recommends that you schedule a follow-up appointment in 5-6 WEEKS with Dr. Anne FuSkains   Any Additional Special Instructions Will Be Listed Below (If Applicable).  --NO DRIVING FOR 6 MONTHS--  If you need a refill on your cardiac medications before your next appointment, please call your pharmacy.
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
## 2017-06-01 LAB — T4, FREE: Free T4: 1.31 ng/dL (ref 0.82–1.77)

## 2017-06-01 LAB — TSH: TSH: 2.28 u[IU]/mL (ref 0.450–4.500)

## 2017-06-03 ENCOUNTER — Telehealth: Payer: Self-pay | Admitting: Cardiology

## 2017-06-03 NOTE — Telephone Encounter (Signed)
Called pt and left message for pt to call back to get lab results. °

## 2017-06-04 ENCOUNTER — Telehealth: Payer: Self-pay | Admitting: Internal Medicine

## 2017-06-04 NOTE — Telephone Encounter (Signed)
LM for patient explaining to her about referral options.  Asked her to call back with what she would like to do and also for her to check into medicaid transportation as they would be able to drive her to [place]. Melea Prezioso,CMA

## 2017-06-04 NOTE — Telephone Encounter (Signed)
Pt said [place] but cant drive until April. Would like a referral [place] in [place].

## 2017-06-04 NOTE — Telephone Encounter (Signed)
There currently NO dermatology office accepting Medicaid in [place]. Only other options in [place].

## 2017-06-04 NOTE — Telephone Encounter (Signed)
Will forward to referral coordinator to see if this is an option since patient has medicaid. [name],CMA

## 2017-06-05 NOTE — Telephone Encounter (Signed)
LM on nurse line, wanted to know the addresses.   Called an LMOVM for pt informing her of the below addresses:  [facility] Address: [address], [place], [place] [address] Phone: [phone](336) [phone]  [facility] [address], [place], [place] [address] Phone: [phone]   Will wait to hear back from patient. Zailyn Rowser, [name], CMA

## 2017-06-06 NOTE — Telephone Encounter (Signed)
Thank you both for working on this!

## 2017-06-26 ENCOUNTER — Ambulatory Visit (INDEPENDENT_AMBULATORY_CARE_PROVIDER_SITE_OTHER): Payer: Medicaid Other

## 2017-06-26 ENCOUNTER — Ambulatory Visit (HOSPITAL_COMMUNITY): Payer: Medicaid Other

## 2017-06-26 DIAGNOSIS — R55 Syncope and collapse: Secondary | ICD-10-CM

## 2017-07-08 ENCOUNTER — Other Ambulatory Visit: Payer: Self-pay | Admitting: Internal Medicine

## 2017-07-12 ENCOUNTER — Other Ambulatory Visit: Payer: Self-pay

## 2017-07-12 ENCOUNTER — Ambulatory Visit (HOSPITAL_COMMUNITY): Payer: Medicaid Other | Attending: Internal Medicine

## 2017-07-12 DIAGNOSIS — R55 Syncope and collapse: Secondary | ICD-10-CM | POA: Diagnosis not present

## 2017-07-12 DIAGNOSIS — E669 Obesity, unspecified: Secondary | ICD-10-CM | POA: Diagnosis not present

## 2017-07-12 DIAGNOSIS — R079 Chest pain, unspecified: Secondary | ICD-10-CM | POA: Diagnosis not present

## 2017-07-12 DIAGNOSIS — I10 Essential (primary) hypertension: Secondary | ICD-10-CM | POA: Diagnosis not present

## 2017-07-12 DIAGNOSIS — I313 Pericardial effusion (noninflammatory): Secondary | ICD-10-CM | POA: Insufficient documentation

## 2017-07-12 DIAGNOSIS — R609 Edema, unspecified: Secondary | ICD-10-CM | POA: Insufficient documentation

## 2017-07-12 DIAGNOSIS — R0600 Dyspnea, unspecified: Secondary | ICD-10-CM | POA: Insufficient documentation

## 2017-07-30 DIAGNOSIS — I639 Cerebral infarction, unspecified: Secondary | ICD-10-CM

## 2017-07-30 HISTORY — DX: Cerebral infarction, unspecified: I63.9

## 2017-08-07 ENCOUNTER — Telehealth: Payer: Self-pay | Admitting: Licensed Clinical Social Worker

## 2017-08-07 NOTE — Progress Notes (Signed)
Type of Service: Clinical Social Work  LCSW received phone call from patient, states she needs to get a handy cap placard as her knee is bothering her.  Patient is not currently using assistance walking devices , has no O2, no shortness of breath or difficulty breathing. LCSW reviewed information and process about the disability parking placard.  Patient appreciative of information, states she will make a F/U appointment with her orthopedic to discuss concerns with her knee.   Plan: Patient will talk with ortho about the placard when she calls to schedule her next office visit.   Sammuel Hineseborah Moore, LCSW Licensed Clinical Social Worker [facility]   [phone] 9:25 AM

## 2017-08-08 ENCOUNTER — Ambulatory Visit (INDEPENDENT_AMBULATORY_CARE_PROVIDER_SITE_OTHER): Payer: Medicaid Other | Admitting: Cardiology

## 2017-08-08 ENCOUNTER — Encounter: Payer: Self-pay | Admitting: Cardiology

## 2017-08-08 VITALS — BP 140/90 | HR 76 | Ht 61.0 in | Wt 197.1 lb

## 2017-08-08 DIAGNOSIS — I10 Essential (primary) hypertension: Secondary | ICD-10-CM

## 2017-08-08 DIAGNOSIS — R55 Syncope and collapse: Secondary | ICD-10-CM

## 2017-08-08 NOTE — Patient Instructions (Signed)
Medication Instructions:  The current medical regimen is effective;  continue present plan and medications.  Follow-Up: Follow up in 6 months with Nada BoozerLaura Ingold, NP.  You will receive a letter in the mail 2 months before you are due.  Please call us when you receive this letter to schedule your follow up appointment.  If you need a refill on your cardiac medications before your next appointment, please call your pharmacy.  Thank you for choosing [facility]!!    Make sure to stay well Hydrated!!
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
## 2017-09-20 ENCOUNTER — Other Ambulatory Visit: Payer: Self-pay | Admitting: Internal Medicine

## 2017-10-04 NOTE — Telephone Encounter (Signed)
Pt called nurse line wanting a referral to a dermatologist in [place]. Given the information below from November. Shawna OrleansMeredith B Ameria Sanjurjo, RN

## 2017-10-23 ENCOUNTER — Other Ambulatory Visit: Payer: Self-pay

## 2017-10-23 MED ORDER — ACIPHEX 20 MG PO TBEC: DELAYED_RELEASE_TABLET | ORAL | 0 refills | Status: DC

## 2017-10-23 NOTE — Telephone Encounter (Signed)
Pt calling for refill of aciphex Call back number [phone] Shawna OrleansMeredith B Thomsen, RN

## 2017-10-30 ENCOUNTER — Other Ambulatory Visit: Payer: Self-pay

## 2017-10-30 ENCOUNTER — Emergency Department (HOSPITAL_COMMUNITY): Payer: Medicaid Other

## 2017-10-30 ENCOUNTER — Emergency Department (HOSPITAL_COMMUNITY)
Admission: EM | Admit: 2017-10-30 | Discharge: 2017-10-30 | Disposition: A | Discharge status: Home / Self Care | Payer: Medicaid Other | Attending: Emergency Medicine | Admitting: Emergency Medicine

## 2017-10-30 DIAGNOSIS — Z79899 Other long term (current) drug therapy: Secondary | ICD-10-CM | POA: Diagnosis not present

## 2017-10-30 DIAGNOSIS — Z7982 Long term (current) use of aspirin: Secondary | ICD-10-CM | POA: Insufficient documentation

## 2017-10-30 DIAGNOSIS — R110 Nausea: Secondary | ICD-10-CM | POA: Diagnosis not present

## 2017-10-30 DIAGNOSIS — J029 Acute pharyngitis, unspecified: Secondary | ICD-10-CM | POA: Diagnosis not present

## 2017-10-30 DIAGNOSIS — I10 Essential (primary) hypertension: Secondary | ICD-10-CM | POA: Insufficient documentation

## 2017-10-30 LAB — I-STAT CHEM 8, ED
BUN: 15 mg/dL (ref 6–20)
Calcium, Ion: 1.02 mmol/L — ABNORMAL LOW (ref 1.15–1.40)
Chloride: 108 mmol/L (ref 101–111)
Creatinine, Ser: 0.8 mg/dL (ref 0.44–1.00)
Glucose, Bld: 104 mg/dL — ABNORMAL HIGH (ref 65–99)
HEMATOCRIT: 35 % — AB (ref 36.0–46.0)
HEMOGLOBIN: 11.9 g/dL — AB (ref 12.0–15.0)
POTASSIUM: 3.6 mmol/L (ref 3.5–5.1)
Sodium: 141 mmol/L (ref 135–145)
TCO2: 23 mmol/L (ref 22–32)

## 2017-10-30 LAB — I-STAT TROPONIN, ED: Troponin i, poc: 0 ng/mL (ref 0.00–0.08)

## 2017-10-30 MED ORDER — ONDANSETRON HCL 4 MG PO TABS: 4.0000 mg | ORAL_TABLET | Freq: Three times a day (TID) | ORAL | 0 refills | Status: DC | PRN

## 2017-10-30 NOTE — ED Provider Notes (Signed)
Patient placed in Quick Look pathway, seen and evaluated   Chief Complaint:   HPI:   Pt presenting today with concern of mold exposure.  States she noticed this about 1 week ago. Sx began 2 weeks ago,  with HA, nausea, sore throat, cough, sneezing. No vision changes, fevers, abd pain, urinary sx.   ROS: + HA, + sore throat, + sneezing.  Physical Exam:   Gen: No distress  Neuro: Awake and Alert  Skin: Warm    Focused Exam: heart sounds normal, lungs CTAB   Initiation of care has begun. The patient has been counseled on the process, plan, and necessity for staying for the completion/evaluation, and the remainder of the medical screening examination    Robinson, SwazilandJordan N, PA-C 10/30/17 1636    Little, Ambrose Finlandachel Morgan, MD 10/31/17 1511

## 2017-10-30 NOTE — ED Notes (Signed)
Pt not responding to triage calls.
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
## 2017-10-30 NOTE — ED Triage Notes (Signed)
Pt reports smelling mold in her house and reports coughing, sore throat, watery eyes, and feeling hot/cold.

## 2017-10-30 NOTE — ED Notes (Signed)
Patient left at this time with all belongings. 

## 2017-10-30 NOTE — Discharge Instructions (Addendum)
Please follow with your primary care doctor in the next 2 days for a check-up. They must obtain records for further management.  ° °Do not hesitate to return to the Emergency Department for any new, worsening or concerning symptoms.  ° °

## 2017-10-31 ENCOUNTER — Telehealth: Payer: Self-pay

## 2017-10-31 NOTE — Telephone Encounter (Signed)
Patient called appt desk asking for appointment. Receptionist asked reason for appointment and patient stated her left leg is dragging. I spoke with patient and she has a headache and says her left leg feels "weird" and she thinks it is dragging. Patient speech seemed clear but she seemed slightly confused.  Patient was seen in ED yesterday for c/o sore throat. Patient also had labs and EKG due to a c/o chest pain and nausea upon interview in ED.  I advised her to return to ED since the headache and leg symptom are new and requires workup we are unable to perform in office. I asked patient if someone can take her to the ED and she said her neighbor would. I advised her to call [phone] if neighbor is unable to take her. She agreed and assured me she would go to ED. Note routed to PCP. Ples SpecterAlisa Kyeshia Zinn, RN [facility] RN)

## 2017-11-01 ENCOUNTER — Emergency Department (HOSPITAL_COMMUNITY): Payer: Medicaid Other

## 2017-11-01 ENCOUNTER — Emergency Department (HOSPITAL_COMMUNITY)
Admission: EM | Admit: 2017-11-01 | Discharge: 2017-11-01 | Disposition: A | Discharge status: Home / Self Care | Payer: Medicaid Other | Attending: Emergency Medicine | Admitting: Emergency Medicine

## 2017-11-01 ENCOUNTER — Encounter (HOSPITAL_COMMUNITY): Payer: Self-pay | Admitting: *Deleted

## 2017-11-01 ENCOUNTER — Other Ambulatory Visit: Payer: Self-pay

## 2017-11-01 DIAGNOSIS — R519 Headache, unspecified: Secondary | ICD-10-CM

## 2017-11-01 DIAGNOSIS — G44219 Episodic tension-type headache, not intractable: Secondary | ICD-10-CM | POA: Insufficient documentation

## 2017-11-01 DIAGNOSIS — R51 Headache: Secondary | ICD-10-CM | POA: Diagnosis present

## 2017-11-01 DIAGNOSIS — Z79899 Other long term (current) drug therapy: Secondary | ICD-10-CM | POA: Diagnosis not present

## 2017-11-01 DIAGNOSIS — Z7982 Long term (current) use of aspirin: Secondary | ICD-10-CM | POA: Diagnosis not present

## 2017-11-01 DIAGNOSIS — R531 Weakness: Secondary | ICD-10-CM

## 2017-11-01 DIAGNOSIS — I10 Essential (primary) hypertension: Secondary | ICD-10-CM | POA: Insufficient documentation

## 2017-11-01 DIAGNOSIS — I639 Cerebral infarction, unspecified: Secondary | ICD-10-CM | POA: Diagnosis not present

## 2017-11-01 LAB — BASIC METABOLIC PANEL
ANION GAP: 11 (ref 5–15)
BUN: 10 mg/dL (ref 6–20)
CALCIUM: 9 mg/dL (ref 8.9–10.3)
CO2: 23 mmol/L (ref 22–32)
CREATININE: 0.8 mg/dL (ref 0.44–1.00)
Chloride: 104 mmol/L (ref 101–111)
GFR calc Af Amer: >60 mL/min (ref >60)
GFR calc non Af Amer: >60 mL/min (ref >60)
GLUCOSE: 79 mg/dL (ref 65–99)
Potassium: 3.4 mmol/L — ABNORMAL LOW (ref 3.5–5.1)
Sodium: 138 mmol/L (ref 135–145)

## 2017-11-01 LAB — CBC WITH DIFFERENTIAL/PLATELET
BASOS PCT: 0 %
Basophils Absolute: 0 10*3/uL (ref 0.0–0.1)
Eosinophils Absolute: 0 10*3/uL (ref 0.0–0.7)
Eosinophils Relative: 0 %
HEMATOCRIT: 36.2 % (ref 36.0–46.0)
Hemoglobin: 11.5 g/dL — ABNORMAL LOW (ref 12.0–15.0)
LYMPHS ABS: 3.2 10*3/uL (ref 0.7–4.0)
Lymphocytes Relative: 43 %
MCH: 27.4 pg (ref 26.0–34.0)
MCHC: 31.8 g/dL (ref 30.0–36.0)
MCV: 86.2 fL (ref 78.0–100.0)
MONO ABS: 0.4 10*3/uL (ref 0.1–1.0)
MONOS PCT: 5 %
NEUTROS ABS: 3.9 10*3/uL (ref 1.7–7.7)
Neutrophils Relative %: 52 %
Platelets: 266 10*3/uL (ref 150–400)
RBC: 4.2 MIL/uL (ref 3.87–5.11)
RDW: 14 % (ref 11.5–15.5)
WBC: 7.6 10*3/uL (ref 4.0–10.5)

## 2017-11-01 LAB — URINALYSIS, ROUTINE W REFLEX MICROSCOPIC
BILIRUBIN URINE: NEGATIVE
Glucose, UA: NEGATIVE mg/dL
Hgb urine dipstick: NEGATIVE
Ketones, ur: NEGATIVE mg/dL
LEUKOCYTES UA: NEGATIVE
NITRITE: NEGATIVE
PH: 6 (ref 5.0–8.0)
Protein, ur: NEGATIVE mg/dL
Specific Gravity, Urine: 1.017 (ref 1.005–1.030)

## 2017-11-01 LAB — PROTIME-INR
INR: 0.98
Prothrombin Time: 12.9 seconds (ref 11.4–15.2)

## 2017-11-01 LAB — RAPID URINE DRUG SCREEN, HOSP PERFORMED
AMPHETAMINES: NOT DETECTED
BARBITURATES: NOT DETECTED
BENZODIAZEPINES: NOT DETECTED
COCAINE: NOT DETECTED
OPIATES: NOT DETECTED
TETRAHYDROCANNABINOL: NOT DETECTED

## 2017-11-01 LAB — I-STAT TROPONIN, ED: Troponin i, poc: 0 ng/mL (ref 0.00–0.08)

## 2017-11-01 MED ORDER — ASPIRIN EC 325 MG PO TBEC: 325.0000 mg | DELAYED_RELEASE_TABLET | Freq: Once | ORAL | Status: DC

## 2017-11-01 NOTE — ED Notes (Signed)
Pt departed prior to receiving d/c papers.

## 2017-11-01 NOTE — ED Notes (Signed)
EMT drawing labs 

## 2017-11-01 NOTE — Telephone Encounter (Signed)
Thank you for the FYI

## 2017-11-01 NOTE — ED Notes (Signed)
Pt given turkey sandwich and water

## 2017-11-01 NOTE — ED Notes (Signed)
Pt transferred to MRI
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
## 2017-11-01 NOTE — ED Notes (Signed)
Pt ambulated without difficulty, gait steady/even.

## 2017-11-01 NOTE — ED Notes (Signed)
IV access attempted without success.

## 2017-11-01 NOTE — Discharge Instructions (Signed)
1.  Continue your daily aspirin.  Continue your blood pressure medications.  Call your doctor to schedule a recheck this week. 2.  You have evidence of old strokes.  No new stroke is identified.  You can continue to work with your family doctor for stroke risk reduction and outpatient neurology consultations.

## 2017-11-01 NOTE — ED Notes (Signed)
Phlebotomy aware of need for blood

## 2017-11-01 NOTE — ED Notes (Signed)
Delay in lab draw pt in MRI. 

## 2017-11-01 NOTE — ED Triage Notes (Signed)
Pt in reports calling her PCP today and was told to come here for foot drop since 1 yr, pt reports having a head ache intermittently, no facial droop, no slurred speech, no new weakness, A&O x4

## 2017-11-01 NOTE — ED Notes (Signed)
Pt returned from MRI °

## 2017-11-05 ENCOUNTER — Telehealth: Payer: Self-pay | Admitting: Licensed Clinical Social Worker

## 2017-11-05 NOTE — Progress Notes (Signed)
Type of Service: Clinical Social Work  LCSW received voice message from patient, states she had a stroke and needs an Charity fundraiserN to come to her home.  Patient has a hospital F/U with PCP this week.   LCSW will share information with PCP and have Personal Care Services form available in the event PCP would like to move forward with this services.  Megan Hineseborah Jeshurun [place], LCSW Licensed Clinical Social Worker [facility]   [phone] 10:41 AM

## 2017-11-05 NOTE — Progress Notes (Signed)
Type of Service: Clinical Social Work  LCSW received phone call from patient, ref. F/U on voice message.     The following was discussed her request for an RN; Activities of daily living; medial Alert systems; Community resources.  Based on information provided by patient, she is able to perform all ADL's and does not need PCS services.  Patient states she wants an RN to come to her home once a month to check on her. LCSW informed patient that her PCP will determine if she needs a nurse when she comes in for her appointment Thursday.  LCSW will share patient's concerns with PCP.  Patient appreciative of assistance.  Intervention: emotional support, review of  Community Resource    Plan: LCSW will provide patient with information on Medical Alert program during her next visit with PCP.  Sammuel Hineseborah Judi Jaffe, LCSW Licensed Clinical Social Worker [facility]   [phone] 2:37 PM

## 2017-11-07 ENCOUNTER — Encounter: Payer: Self-pay | Admitting: Internal Medicine

## 2017-11-07 ENCOUNTER — Ambulatory Visit: Payer: Medicaid Other | Admitting: Internal Medicine

## 2017-11-07 ENCOUNTER — Other Ambulatory Visit: Payer: Self-pay

## 2017-11-07 VITALS — BP 136/75 | HR 76 | Temp 99.2°F | Wt 189.0 lb

## 2017-11-07 DIAGNOSIS — I639 Cerebral infarction, unspecified: Secondary | ICD-10-CM

## 2017-11-07 DIAGNOSIS — M48061 Spinal stenosis, lumbar region without neurogenic claudication: Secondary | ICD-10-CM

## 2017-11-07 LAB — POCT GLYCOSYLATED HEMOGLOBIN (HGB A1C): HEMOGLOBIN A1C: 5.6

## 2017-11-07 MED ORDER — ATORVASTATIN CALCIUM 40 MG PO TABS: 40.0000 mg | ORAL_TABLET | Freq: Every day | ORAL | 3 refills | Status: DC

## 2017-11-07 NOTE — Progress Notes (Signed)
   Megan Miller [facility] Phone: [phone]  Subjective:  Megan Miller is a 62 year old female presenting to clinic for follow-up of her stroke. She was seen in the ED on 11/01/17 with left foot dragging for the last 5-6 months. MRI brain showed no acute intracranial process, but did show old right basal ganglia, bilateral thalamus, and bilateral cerebellar small infarcts. She also had MRI lumbar spine that showed L4-5 disc extrusion resulting in right L4 nerve impingement and moderate to severe canal stenosis L3-4 and moderate stenosis at L4-5. She states her back pain is worse with mopping the floor, picking things up off the floor, and walking. Aspirin makes the pain a little bit better. She does have pain radiating down her right leg. No headaches, no dysarthria, no numbness, no extremity weakness except in her left foot, no changes in vision, no saddle anesthesia, no bowel or bladder incontinence.   ROS: See HPI for pertinent positives and negatives  Past Medical History- HTN, stable angina, GERD, depression, dysthymic disorder, chronic low back pain.  Family history reviewed for today's visit. No changes.  Social history- patient is a never smoker  Objective: BP 136/75   Pulse 76   Temp 99.2 F (37.3 C) (Oral)   Wt 189 lb (85.7 kg)   SpO2 99%   BMI 35.71 kg/m  Gen: NAD, alert, cooperative with exam HEENT: NCAT, EOMI, MMM Neck: FROM, supple CV: RRR, no murmur Resp: CTABL, no wheezes, normal work of breathing Back: No midline tenderness to palpation, no tenderness of the paraspinal muscles, +increased pain with flexion, normal ROM Msk: No edema, warm, normal tone, moves UE/LE spontaneously Neuro: Alert and oriented, CN 2-12 intact, 5/5 muscle strength in the lower extremities bilaterally, sensation intact to light touch throughout, normal finger-to-nose, reflexes symmetric, normal gait, ?mild left foot drop; straight leg raise negative bilaterally Skin: No rashes, no  lesions Psych: Appropriate behavior  Assessment/Plan: CVA: MRI brain in the ED on 4/5 showed old right basal ganglia, bilateral thalamus, and bilateral cerebellar small infarcts. Seen by Neurology in the ED and it was recommended that she continue on her daily aspirin. Recent ECHO 06/2017 with EF 60-65% and G2DD.  - Check A1c, lipid panel, TSH for risk stratification - Continue Aspirin 81mg  daily - Referral to Neurology  Lumbar Spinal Stenosis: May be the cause of her left foot drop. MRI lumbar spine 4/5 showed L4-5 disc extrusion resulting in right L4 nerve impingement and moderate to severe canal stenosis L3-4 and moderate stenosis at L4-5. - Referral to Spine Surgery   Willadean CarolKaty Mayo, MD PGY-3

## 2017-11-07 NOTE — Patient Instructions (Addendum)
It was so nice to see you today!  For your spine- I have sent in a referral to the spine doctor. You should hear from our office in the next two weeks to schedule that appointment.  For your stroke- I have ordered some labs to see if we can reduce your risk of stroke. I will call you with these results. I have also started a medication called Lipitor. Please take 1 tablet daily at bedtime. I have sent in a referral to the neurologist (brain doctor). You should hear from our office in 2 weeks to schedule this appointment.  We will see you back in 3 months!  -Dr. Nancy MarusMayo

## 2017-11-08 DIAGNOSIS — M48061 Spinal stenosis, lumbar region without neurogenic claudication: Secondary | ICD-10-CM | POA: Insufficient documentation

## 2017-11-08 DIAGNOSIS — I639 Cerebral infarction, unspecified: Secondary | ICD-10-CM | POA: Insufficient documentation

## 2017-11-08 LAB — TSH: TSH: 2.14 u[IU]/mL (ref 0.450–4.500)

## 2017-11-08 LAB — LIPID PANEL
CHOL/HDL RATIO: 2.6 ratio (ref 0.0–4.4)
CHOLESTEROL TOTAL: 146 mg/dL (ref 100–199)
HDL: 57 mg/dL (ref >39)
LDL CALC: 74 mg/dL (ref 0–99)
Triglycerides: 73 mg/dL (ref 0–149)
VLDL CHOLESTEROL CAL: 15 mg/dL (ref 5–40)

## 2017-11-08 NOTE — Assessment & Plan Note (Signed)
MRI brain in the ED on 4/5 showed old right basal ganglia, bilateral thalamus, and bilateral cerebellar small infarcts. Seen by Neurology in the ED and it was recommended that she continue on her daily aspirin. Recent ECHO 06/2017 with EF 60-65% and G2DD.  - Check A1c, lipid panel, TSH for risk stratification - Continue Aspirin 81mg  daily - Referral to Neurology

## 2017-11-08 NOTE — Assessment & Plan Note (Signed)
May be the cause of her left foot drop. MRI lumbar spine 4/5 showed L4-5 disc extrusion resulting in right L4 nerve impingement and moderate to severe canal stenosis L3-4 and moderate stenosis at L4-5. - Referral to Spine Surgery

## 2017-11-12 ENCOUNTER — Encounter: Payer: Self-pay | Admitting: Neurology

## 2017-11-12 ENCOUNTER — Telehealth: Payer: Self-pay | Admitting: Neurology

## 2017-11-12 ENCOUNTER — Ambulatory Visit: Payer: Medicaid Other | Admitting: Neurology

## 2017-11-12 VITALS — BP 100/61 | HR 72 | Ht 61.0 in | Wt 183.0 lb

## 2017-11-12 DIAGNOSIS — M5417 Radiculopathy, lumbosacral region: Secondary | ICD-10-CM | POA: Diagnosis not present

## 2017-11-12 DIAGNOSIS — Z8673 Personal history of transient ischemic attack (TIA), and cerebral infarction without residual deficits: Secondary | ICD-10-CM | POA: Diagnosis not present

## 2017-11-12 DIAGNOSIS — G44209 Tension-type headache, unspecified, not intractable: Secondary | ICD-10-CM | POA: Diagnosis not present

## 2017-11-12 DIAGNOSIS — R55 Syncope and collapse: Secondary | ICD-10-CM

## 2017-11-12 DIAGNOSIS — G43709 Chronic migraine without aura, not intractable, without status migrainosus: Secondary | ICD-10-CM

## 2017-11-12 MED ORDER — TOPIRAMATE 25 MG PO TABS: ORAL_TABLET | ORAL | 3 refills | Status: DC

## 2017-11-12 NOTE — Telephone Encounter (Signed)
Medicaid order sent to GI. They will obtain the auth and will reach out to the pt to schedule.  °

## 2017-11-12 NOTE — Progress Notes (Addendum)
NEUROLOGY CLINIC NEW PATIENT NOTE  NAME: Megan Miller DOB: 03-14-1956 REFERRING PHYSICIAN: Mayo, Allyn Kenner, MD  I saw Megan Miller as a new consult in the neurovascular clinic today regarding  Chief Complaint  Patient presents with  . New Patient (Initial Visit)    Referral from Dr. Nancy Marus for headache leg weakness, back pain, old stroke per Dr.Rodrigo Mcgranahan  .  HPI: Megan Miller is a 62 y.o. female with PMH of HTN, CAD, LBP who presents as a new patient for HA and leg weakness.   Pt stated that she had left foot drop and HA for about 6 months. Went to see PCP and recommend to go to ER on 11/01/17. She had MRI showed old right caudate head, left thalamus and b/l cerebellar lacunar infarcts. TTE in 06/2017 with EF 60-65%. LDL 74 and A1C 5.6. She was referred here for further evaluation.   Has followed with cardiology for CP and recurrent syncope episodes. On 11/06/15 patient was in the car in the parking lot, husband found her passed out.  EMS called, patient gradually regained consciousness, no postictal, blamed husband for calling EMS, refused to go to ER.  2 days later, she went to the ER for further evaluation, CT head negative. 05/02/17 she was driving, started to feel blurry vision, dizziness and diaphoresis.  Pulled over and husband found her passed out again, but gradually regained consciousness with EMS on site.  Again complaining husband called EMS, went to the ER second day was told nothing wrong. One month ago, she was at home felt dizzy, and she sat down, gradually feeling good, did not go to hospital. Followed with cardiology and had 30 day cardiac event monitoring showed no afib or other arrythmia. Dr. Anne Fu also requested neurology referral to rule out seizure.    Has chronic LBP and leg pain. MRI L-spine on 11/01/17 showed L4-5 disc protrusion with right L4 impingement. L3-4 disc protrusion with b/l L4 impingement. She has been have left leg mild dragging for 6 months,  especially if tired or fatigue.   She also stated that she started to have HA in her 30s. HA on and off, once a month, bilateral frontal, sharp pain, lasting 2hr average, sometimes with photo- or phonophobia, N/V or need dark room lie down. However, since beginning of the year, her HA more frequent, once a day, throbbing pain. No vision changes.   BP today 100/61.  On carvedilol 25 mg twice daily, lisinopril 20 mg and HCTZ 12.5 mg daily for BP control at home medication.   Past Medical History:  Diagnosis Date  . Allergic conjunctivitis   . Chest pain   . Chronic back pain   . Constipation, chronic   . Dyspnea on exertion   . Dysthymic   . GERD (gastroesophageal reflux disease)   . Hypertension   . Leg edema    Asymetric Right leg (dopplers negative)  . Overweight (BMI 25.0-29.9)   . Stroke (HCC)   . Urination frequency    Past Surgical History:  Procedure Laterality Date  . 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 BILATERAL SALPINGOOPHORECTOMY  05/08/2001   Path=extensive endometriosis  . BREAST SURGERY  12/29/2003   Right breast core biopsy=benign fibrocystic change  . SMALL INTESTINE SURGERY  03/30/2001   Rectosigmoid bowel resection and reanastomosis   Family History  Problem Relation Age of Onset  . Hypertension Mother   . Diabetes Father   . Diabetes Sister   .  Asthma Brother   . Diabetes Brother   . Kidney disease Brother   . Colon cancer Neg Hx   . Colon polyps Neg Hx   . Esophageal cancer Neg Hx   . Breast cancer Neg Hx    Current Outpatient Medications  Medication Sig Dispense Refill  . ACIPHEX 20 MG tablet TAKE 1 TABLET (20 MG TOTAL) BY MOUTH DAILY. PLEASE PROVIDE BRAND NAME. 90 tablet 0  . aspirin 81 MG tablet Take 1 tablet (81 mg total) by mouth daily. 90 tablet 3  . atorvastatin (LIPITOR) 40 MG tablet Take 1 tablet (40 mg total) by mouth daily. 90 tablet 3  . carvedilol (COREG) 25 MG tablet Take 1 tablet (25 mg total) by mouth 2 (two)  times daily. 60 tablet 5  . cetirizine (ZYRTEC) 10 MG tablet Take 1 tablet (10 mg total) by mouth daily. 30 tablet 1  . desonide (DESOWEN) 0.05 % cream Apply topically 2 (two) times daily. 30 g 0  . diphenhydrAMINE (BENADRYL) 25 mg capsule Take 1 capsule (25 mg total) by mouth every 6 (six) hours as needed. 30 capsule 0  . ibuprofen (ADVIL,MOTRIN) 800 MG tablet Take 1 tablet (800 mg total) by mouth every 8 (eight) hours as needed. 60 tablet 2  . ipratropium (ATROVENT) 0.06 % nasal spray Place 2 sprays into both nostrils 4 (four) times daily. 15 mL 0  . lisinopril-hydrochlorothiazide (PRINZIDE,ZESTORETIC) 20-12.5 MG tablet Take 1 tablet by mouth daily. 60 tablet 11  . minoxidil (ROGAINE) 2 % external solution Apply topically 2 (two) times daily. 60 mL 0  . nitroGLYCERIN (NITRODUR - DOSED IN MG/24 HR) 0.2 mg/hr patch APPLY 1/4 PATCH TO AFFECTED AREA. CHANGE PATCH EVERY 24 HOURS. (Patient taking differently: APPLY 1/4 PATCH TO AFFECTED AREA, USES AS NEEDED FOR CHEST PAIN.) 30 patch 1  . nystatin cream (MYCOSTATIN) Apply 1 application topically 2 (two) times daily. 30 g 0  . olopatadine (PATANOL) 0.1 % ophthalmic solution Place 1 drop into both eyes 2 (two) times daily. For allergy symptoms     . omeprazole (PRILOSEC) 40 MG capsule Take 1 capsule (40 mg total) by mouth daily. 30 capsule 0  . ondansetron (ZOFRAN) 4 MG tablet Take 1 tablet (4 mg total) by mouth every 8 (eight) hours as needed for nausea or vomiting. 10 tablet 0  . PAZEO 0.7 % SOLN Place into the right eye daily.   3  . polyethylene glycol powder (GLYCOLAX/MIRALAX) powder Dissolve 17 grams in at least 8 ounces water/juice and drink daily 527 g 4  . Potassium Chloride ER 20 MEQ TBCR Take 20 mEq by mouth 2 (two) times daily at 10 AM and 5 PM. 6 tablet 0  . RESTASIS 0.05 % ophthalmic emulsion Place 1 drop into the right eye daily.   3  . sertraline (ZOLOFT) 50 MG tablet Take 1 tablet (50 mg total) by mouth daily. 30 tablet 3  . simethicone  (GAS-X) 80 MG chewable tablet Chew 1 tablet (80 mg total) by mouth every 6 (six) hours as needed for flatulence. 30 tablet 0  . triamcinolone ointment (KENALOG) 0.1 % Apply 1 application topically 2 (two) times daily. 80 g 0  . Zoster Vaccine Live, PF, (ZOSTAVAX) 09811 UNT/0.65ML injection Inject 19,400 Units into the skin once. 1 each 0  . topiramate (TOPAMAX) 25 MG tablet 25mg  nightly for 7 days then 25mg  bid for 7 days and then 50mg  bid. 120 tablet 3   No current facility-administered medications for this visit.  Allergies  Allergen Reactions  . Percocet [Oxycodone-Acetaminophen] Nausea And Vomiting  . Codeine Phosphate Itching    REACTION: Denies rash or allergic reaction.   Social History   Socioeconomic History  . Marital status: Divorced    Spouse name: Not on file  . Number of children: 0  . Years of education: Not on file  . Highest education level: Not on file  Occupational History  . Occupation: Disability  Social Needs  . Financial resource strain: Not on file  . Food insecurity:    Worry: Not on file    Inability: Not on file  . Transportation needs:    Medical: Not on file    Non-medical: Not on file  Tobacco Use  . Smoking status: Never Smoker  . Smokeless tobacco: Never Used  Substance and Sexual Activity  . Alcohol use: No    Alcohol/week: 0.0 oz  . Drug use: No  . Sexual activity: Never    Birth control/protection: Abstinence  Lifestyle  . Physical activity:    Days per week: Not on file    Minutes per session: Not on file  . Stress: Not on file  Relationships  . Social connections:    Talks on phone: Not on file    Gets together: Not on file    Attends religious service: Not on file    Active member of club or organization: Not on file    Attends meetings of clubs or organizations: Not on file    Relationship status: Not on file  . Intimate partner violence:    Fear of current or ex partner: Not on file    Emotionally abused: Not on file     Physically abused: Not on file    Forced sexual activity: Not on file  Other Topics Concern  . Not on file  Social History Narrative  . Not on file    Review of Systems Full 14 system review of systems performed and notable only for those listed, all others are neg:  Constitutional:   Cardiovascular:  Ear/Nose/Throat:   Skin:  Eyes:   Respiratory:   Gastroitestinal:   Genitourinary:  Hematology/Lymphatic:   Endocrine:  Musculoskeletal:   Allergy/Immunology:   Neurological:   Psychiatric:  Sleep:    Physical Exam  Vitals:   11/12/17 0857  BP: 100/61  Pulse: 72    General - Well nourished, well developed, in no apparent distress.  Ophthalmologic - fundi  visualized due to small pupils.  Cardiovascular - Regular rate and rhythm.  Neck - supple, no nuchal rigidity.  Mental Status -  Level of arousal and orientation to time, place, and person were intact. Language including expression, naming, repetition, comprehension, reading, and writing was assessed and found intact. Attention span and concentration were normal. Recent and remote memory were intact. Fund of Knowledge was assessed and was intact.  Cranial Nerves II - XII - II - Visual field intact OU. III, IV, VI - Extraocular movements intact. V - Facial sensation intact bilaterally. VII - Facial movement intact bilaterally. VIII - Hearing & vestibular intact bilaterally. X - Palate elevates symmetrically. XI - Chin turning & shoulder shrug intact bilaterally. XII - Tongue protrusion intact.  Motor Strength - The patient's strength was normal in all extremities and pronator drift was absent.  Bulk was normal and fasciculations were absent.   Motor Tone - Muscle tone was assessed at the neck and appendages and was normal.  Reflexes - The patient's reflexes were normal in  all extremities and she had no pathological reflexes.  Sensory - Light touch, temperature/pinprick, vibration and proprioception, and  Romberg testing were assessed and were normal.    Coordination - The patient had normal movements in the hands and feet with no ataxia or dysmetria.  Tremor was absent.  Gait and Station - The patient's transfers, posture, gait, station, and turns were observed as normal.   Imaging  I have personally reviewed the radiological images below and agree with the radiology interpretations.  Mr Brain Wo Contrast (neuro Protocol)  Result Date: 11/01/2017 CLINICAL DATA:  Intermittent headache.  History of hypertension. EXAM: MRI HEAD WITHOUT CONTRAST TECHNIQUE: Multiplanar, multiecho pulse sequences of the brain and surrounding structures were obtained without intravenous contrast. COMPARISON:  CT HEAD November 06, 2015 FINDINGS: INTRACRANIAL CONTENTS: No reduced diffusion to suggest acute ischemia. No susceptibility artifact to suggest hemorrhage. The ventricles and sulci are normal for patient's age. Patchy supratentorial white matter FLAIR T2 hyperintensities. Old small bilateral cerebellar infarcts. Old bilateral thalamus and RIGHT basal ganglia lacunar infarcts. No suspicious parenchymal signal, masses, mass effect. No abnormal extra-axial fluid collections. No extra-axial masses. VASCULAR: Normal major intracranial vascular flow voids present at skull base. SKULL AND UPPER CERVICAL SPINE: No abnormal sellar expansion. No suspicious calvarial bone marrow signal. Craniocervical junction maintained. SINUSES/ORBITS: The mastoid air-cells and included paranasal sinuses are well-aerated.The included ocular globes and orbital contents are non-suspicious. OTHER: None. IMPRESSION: 1. No acute intracranial process. 2. Old RIGHT basal ganglia, bilateral thalamus and bilateral cerebellar small infarcts. 3. Mild chronic small vessel ischemic disease. Electronically Signed   By: Awilda Metro M.D.   On: 11/01/2017 21:12   Mr Lumbar Spine Wo Contrast  Result Date: 11/01/2017 CLINICAL DATA:  Foot drop for 1 year. EXAM:  MRI LUMBAR SPINE WITHOUT CONTRAST TECHNIQUE: Multiplanar, multisequence MR imaging of the lumbar spine was performed. No intravenous contrast was administered. COMPARISON:  None. FINDINGS: SEGMENTATION: For the purposes of this report, the last well-formed intervertebral disc is reported as L5-S1. ALIGNMENT: Maintained lumbar lordosis. Minimal grade 1 L3-4 anterolisthesis. No spondylolysis. Mild lower lumbar dextroscoliosis on this nonweightbearing examination. VERTEBRAE:Vertebral bodies are intact. Mild L3-4 and L4-5 disc height loss with mild desiccation. Developmentally smaller L5-S1 disc. No acute or abnormal bone marrow signal. CONUS MEDULLARIS AND CAUDA EQUINA: Conus medullaris terminates at L1-2 and demonstrates normal morphology and signal characteristics. Central displacement of the cauda equina due to canal stenosis, otherwise normal. PARASPINAL AND OTHER SOFT TISSUES: Nonacute. Mild symmetric paraspinal muscle atrophy. DISC LEVELS: L1-2: Annular bulging and small central disc protrusion without canal stenosis or neural foraminal narrowing. L2-3: Annular bulging. Mild facet arthropathy and ligamentum flavum redundancy without canal stenosis or neural foraminal narrowing. L3-4: Anterolisthesis. 6 mm central disc protrusion versus extrusion without migration, surrounding low signal favoring blood products. Moderate to severe LEFT and moderate RIGHT facet arthropathy and ligamentum flavum redundancy. Moderate to severe canal stenosis, AP dimension of thecal sac is 4 mm with lateral recess narrowing which may affect the traversing L4 nerves. Mild LEFT neural foraminal narrowing. L4-5: RIGHT central 6 x 7 mm disc extrusion with 6 mm contiguous superior migration contacting and posteriorly displacing the traversing RIGHT L4 nerve. Severe facet arthropathy and ligamentum flavum redundancy. Moderate canal stenosis. Minimal neural foraminal narrowing. L5-S1: No disc bulge. Moderate RIGHT, severe LEFT facet  arthropathy without canal stenosis or neural foraminal narrowing. IMPRESSION: 1. Minimal grade 1 L4-5 anterolisthesis without spondylolysis. No fracture or acute osseous process. 2. 6 x 7 x 6 mm L4-5  disc extrusion resulting in traversing RIGHT L4 nerve impingement. 6 mm L3-4 disc protrusion versus extrusion could affect the traversing bilateral L4 nerves. 3. Moderate to severe canal stenosis L3-4, moderate at L4-5. 4. Minimal to mild L3-4 and L4-5 neural foraminal narrowing. Electronically Signed   By: Awilda Metro M.D.   On: 11/01/2017 21:23   Lab Review Component     Latest Ref Rng & Units 11/01/2017 11/07/2017  Opiates     NONE DETECTED NONE DETECTED   COCAINE     NONE DETECTED NONE DETECTED   Benzodiazepines     NONE DETECTED NONE DETECTED   Amphetamines     NONE DETECTED NONE DETECTED   Tetrahydrocannabinol     NONE DETECTED NONE DETECTED   Barbiturates     NONE DETECTED NONE DETECTED   Cholesterol, Total     100 - 199 mg/dL  272  Triglycerides     0 - 149 mg/dL  73  HDL Cholesterol     >39 mg/dL  57  VLDL Cholesterol Cal     5 - 40 mg/dL  15  LDL (calc)     0 - 99 mg/dL  74  Total CHOL/HDL Ratio     0.0 - 4.4 ratio  2.6  Hemoglobin A1C       5.6  TSH     0.450 - 4.500 uIU/mL  2.140     Assessment and Plan:   In summary, Megan Miller is a 62 y.o. female with PMH of HTN, CAD and LBP presents as new patient for multiple neurological issues.   1. Old strokes on MRI - location of right caudate head, left thalamus and b/l cerebellar lacunar infarcts consistent with small vessel disease. LDL 74 and A1C 5.6. Continue ASA and lipitor. Will do CTA head and neck for further eval. Stroke risk modification  2. Recurrent syncope - presentation concerns for low BP related or vasovagal. Her BP today 100/61. She is on high dose coreg, also on lisinopril and HCTZ. Will decrease coreg to 12.5mg  bid and ask her to close monitor BP at home. Seizure less likely but will do EEG to  rule out  3. Left leg dragging - chronic for 6 months, mild, associated with back pain. Likely radiculopathy, not able to explain by small strokes seen on the MRI. Will do EMG/NCS  4. Headache - likely migraine HA, but some features not very typical. Fundi not able to see through. No vision changes, IIH or VA less likely. Tension HA in DDx. Will start topamax.   - continue ASA and lipitor for stroke prevention - decrease carvediolo to 12.5mg  (half tab) twice a day due to low BP. Check BP daily and record. Goal BP 120-140 - will start topamax for your HA. Take 1 tab at night daily for 7 days and then 1 tab twice a day for 7 days and then 2 tab twice a day after - follow up with eye doctor to rule out back of eye swelling - will check CT head and neck vessels for stroke work up - will do EEG to rule out seizure - will do EMG/NCS for left leg dragging - Follow up with your primary care physician for stroke risk factor modification. Recommend maintain blood pressure goal <130/80, diabetes with hemoglobin A1c goal below 7.0% and lipids with LDL cholesterol goal below 70 mg/dL.  - follow with cardiology - healthy diet and regular exercises - follow up in 4 weeks with me.  Thank you very much for the opportunity to participate in the care of this patient.  Please do not hesitate to call if any questions or concerns arise.  A total of more than 55 minutes was spent face-to-face with this patient. Over half this time was spent on counseling patient on the multiple neuro diagnosis and different diagnostic and therapeutic options available.   Orders Placed This Encounter  Procedures  . CT ANGIO HEAD W OR WO CONTRAST    Standing Status:   Future    Standing Expiration Date:   01/13/2019    Order Specific Question:   If indicated for the ordered procedure, I authorize the administration of contrast media per Radiology protocol    Answer:   Yes    Order Specific Question:   Preferred imaging location?     Answer:   [facility]    Order Specific Question:   Radiology Contrast Protocol - do NOT remove file path    Answer:   \\[email or web address]  . CT ANGIO NECK W OR WO CONTRAST    Standing Status:   Future    Standing Expiration Date:   01/13/2019    Order Specific Question:   If indicated for the ordered procedure, I authorize the administration of contrast media per Radiology protocol    Answer:   Yes    Order Specific Question:   Preferred imaging location?    Answer:   [facility]    Order Specific Question:   Radiology Contrast Protocol - do NOT remove file path    Answer:   \\[email or web address]  . EEG adult    Standing Status:   Future    Standing Expiration Date:   11/13/2018    Order Specific Question:   Where should this test be performed?    Answer:   [facility]  . NCV with EMG(electromyography)    Standing Status:   Future    Standing Expiration Date:   11/13/2018    Order Specific Question:   Where should this test be performed?    Answer:   [facility]    Meds ordered this encounter  Medications  . topiramate (TOPAMAX) 25 MG tablet    Sig: 25mg  nightly for 7 days then 25mg  bid for 7 days and then 50mg  bid.    Dispense:  120 tablet    Refill:  3    Patient Instructions  - continue ASA and lipitor for stroke prevention - decrease carvediolo to 12.5mg  (half tab) twice a day due to low BP. Check BP daily and record. Goal BP 120-140 - will start topamax for your HA. Take 1 tab at night daily for 7 days and then 1 tab twice a day for 7 days and then 2 tab twice a day after - follow up with eye doctor to rule out back of eye swelling - will check CT head and neck vessels for stroke work up - will do EEG to rule out seizure - will do EMG/NCS for left leg dragging - Follow up with your primary care physician for stroke risk factor modification. Recommend maintain blood pressure goal <130/80, diabetes with hemoglobin A1c goal  below 7.0% and lipids with LDL cholesterol goal below 70 mg/dL.  - follow with cardiology - healthy diet and regular exercises - follow up in 4 weeks with me.    Marvel Plan, MD PhD [facility] [address] [place], [place] [address] [phone]

## 2017-11-12 NOTE — Patient Instructions (Addendum)
-   continue ASA and lipitor for stroke prevention - decrease carvediolo to 12.5mg  (half tab) twice a day due to low BP. Check BP daily and record. Goal BP 120-140 - will start topamax for your HA. Take 1 tab at night daily for 7 days and then 1 tab twice a day for 7 days and then 2 tab twice a day after - follow up with eye doctor to rule out back of eye swelling - will check CT head and neck vessels for stroke work up - will do EEG to rule out seizure - will do EMG/NCS for left leg dragging - Follow up with your primary care physician for stroke risk factor modification. Recommend maintain blood pressure goal <130/80, diabetes with hemoglobin A1c goal below 7.0% and lipids with LDL cholesterol goal below 70 mg/dL.  - follow with cardiology - healthy diet and regular exercises - follow up in 4 weeks with me.

## 2017-11-13 DIAGNOSIS — G44209 Tension-type headache, unspecified, not intractable: Secondary | ICD-10-CM | POA: Insufficient documentation

## 2017-11-13 DIAGNOSIS — G43709 Chronic migraine without aura, not intractable, without status migrainosus: Secondary | ICD-10-CM | POA: Insufficient documentation

## 2017-11-14 ENCOUNTER — Ambulatory Visit: Payer: Medicaid Other

## 2017-11-14 DIAGNOSIS — R55 Syncope and collapse: Secondary | ICD-10-CM | POA: Diagnosis not present

## 2017-11-19 ENCOUNTER — Telehealth: Payer: Self-pay

## 2017-11-19 NOTE — Telephone Encounter (Signed)
Left vm for patient to call back about EEG results.  

## 2017-11-19 NOTE — Telephone Encounter (Signed)
Return patients phone call 2nd message.

## 2017-11-19 NOTE — Telephone Encounter (Signed)
-----   Message from Marvel PlanJindong Xu, MD sent at 11/18/2017  6:12 PM EDT ----- Could you please let the patient know that the EEG test done recently in our office was normal. Please continue current treatment. Thanks.  Marvel PlanJindong Xu, MD PhD Stroke Neurology 11/18/2017 6:12 PM

## 2017-11-19 NOTE — Telephone Encounter (Signed)
Pt returning RN's call.

## 2017-11-20 NOTE — Telephone Encounter (Signed)
Notes recorded by Hildred AlaminMurrell, Katrina Y, RN on 11/20/2017 at 11:47 AM EDT Rn call patient that her EEG was normal. Pt verbalized understanding. ------

## 2017-11-20 NOTE — Telephone Encounter (Signed)
Patient returned RN's call. 

## 2017-11-24 ENCOUNTER — Other Ambulatory Visit: Payer: Self-pay | Admitting: Internal Medicine

## 2017-11-25 ENCOUNTER — Ambulatory Visit
Admission: RE | Admit: 2017-11-25 | Discharge: 2017-11-25 | Disposition: A | Discharge status: Home / Self Care | Payer: Medicaid Other | Source: Ambulatory Visit | Attending: Neurology | Admitting: Neurology

## 2017-11-25 DIAGNOSIS — Z8673 Personal history of transient ischemic attack (TIA), and cerebral infarction without residual deficits: Secondary | ICD-10-CM

## 2017-11-25 MED ORDER — IOPAMIDOL (ISOVUE-370) INJECTION 76%
75.0000 mL | Freq: Once | INTRAVENOUS | Status: AC | PRN
  Administered 2017-11-25: 75 mL via INTRAVENOUS

## 2017-11-28 ENCOUNTER — Telehealth: Payer: Self-pay

## 2017-11-28 NOTE — Telephone Encounter (Signed)
-----   Message from Jindong Xu, MD sent at 11/27/2017  6:52 PM EDT ----- Could you please let the patient know that the CT head and neck vessel test done recently was unremarkable. Please continue current treatment. Thanks.  Jindong Xu, MD PhD Stroke Neurology 11/27/2017 6:52 PM   

## 2017-11-28 NOTE — Telephone Encounter (Signed)
Notes recorded by Keithon Mccoin Y, RN on 11/28/2017 at 11:29 AM EDT Left vm for patient to call back about Ct head,and Ct neck results. ------ 

## 2017-11-28 NOTE — Telephone Encounter (Signed)
Notes recorded by Hildred Alamin, RN on 11/28/2017 at 11:29 AM EDT Left vm for patient to call back about Ct head,and Ct neck results. ------

## 2017-11-28 NOTE — Telephone Encounter (Signed)
-----   Message from Marvel Plan, MD sent at 11/27/2017  6:52 PM EDT ----- Could you please let the patient know that the CT head and neck vessel test done recently was unremarkable. Please continue current treatment. Thanks.  Marvel Plan, MD PhD Stroke Neurology 11/27/2017 6:52 PM

## 2017-11-28 NOTE — Telephone Encounter (Signed)
Rn call patient that the Ct head and neck vessel test was unremarkable. Continue treatment plan. Pt verbalized understanding.

## 2017-12-11 ENCOUNTER — Ambulatory Visit (INDEPENDENT_AMBULATORY_CARE_PROVIDER_SITE_OTHER): Payer: Medicaid Other | Admitting: Neurology

## 2017-12-11 ENCOUNTER — Encounter: Payer: Medicaid Other | Admitting: Neurology

## 2017-12-11 ENCOUNTER — Ambulatory Visit: Payer: Medicaid Other | Admitting: Neurology

## 2017-12-11 ENCOUNTER — Encounter: Payer: Self-pay | Admitting: Neurology

## 2017-12-11 DIAGNOSIS — M5417 Radiculopathy, lumbosacral region: Secondary | ICD-10-CM | POA: Diagnosis not present

## 2017-12-11 NOTE — Progress Notes (Signed)
[facility]    Nerve / Sites Muscle Latency Ref. Amplitude Ref. Rel Amp Segments Distance Velocity Ref. Area    ms ms mV mV %  cm m/s m/s mVms  R Peroneal - EDB     Ankle EDB 4.7 ?6.5 5.1 ?2.0 100 Ankle - EDB 9   14.8     Fib head EDB 10.8  4.6  88.8 Fib head - Ankle 27 44 ?44 13.8     Pop fossa EDB 12.8  4.6  101 Pop fossa - Fib head 10 49 ?44 15.0         Pop fossa - Ankle      L Peroneal - EDB     Ankle EDB 5.3 ?6.5 6.5 ?2.0 100 Ankle - EDB 9   22.9     Fib head EDB 11.7  6.1  93.6 Fib head - Ankle 28 44 ?44 22.5     Pop fossa EDB 14.0  5.8  94.7 Pop fossa - Fib head 10 44 ?44 22.3         Pop fossa - Ankle      R Tibial - AH     Ankle AH 4.7 ?5.8 9.5 ?4.0 100 Ankle - AH 9   17.6     Pop fossa AH 13.3  6.0  63.5 Pop fossa - Ankle 37 43 ?41 17.8  L Tibial - AH     Ankle AH 4.9 ?5.8 12.5 ?4.0 100 Ankle - AH 9   35.4     Pop fossa AH 14.3  8.5  67.8 Pop fossa - Ankle 38 41 ?41 29.7             SNC    Nerve / Sites Rec. Site Peak Lat Ref.  Amp Ref. Segments Distance    ms ms V V  cm  R Sural - Ankle (Calf)     Calf Ankle 4.0 ?4.4 11 ?6 Calf - Ankle 14  L Sural - Ankle (Calf)     Calf Ankle 4.2 ?4.4 10 ?6 Calf - Ankle 14  R Superficial peroneal - Ankle     Lat leg Ankle 4.1 ?4.4 6 ?6 Lat leg - Ankle 14  L Superficial peroneal - Ankle     Lat leg Ankle 4.3 ?4.4 7 ?6 Lat leg - Ankle 14              F  Wave    Nerve F Lat Ref.   ms ms  R Tibial - AH 50.1 ?56.0  L Tibial - AH 50.7 ?56.0

## 2017-12-11 NOTE — Procedures (Signed)
     HISTORY:  Megan Miller is a 62 year old patient with a history of low back pain over the last year with some pain rating down the left leg to the calf muscle area.  The patient feels that there is some slight weakness of the left leg.  She is being evaluated for these symptoms.  NERVE CONDUCTION STUDIES:  Nerve conduction studies were performed on both lower extremities. The distal motor latencies and motor amplitudes for the peroneal and posterior tibial nerves were within normal limits. The nerve conduction velocities for these nerves were also normal. The sensory latencies for the peroneal and sural nerves were within normal limits.  The F-wave latencies for the posterior tibial nerves were within normal limits bilaterally.   EMG STUDIES:  EMG study was performed on the left lower extremity:  The tibialis anterior muscle reveals 2 to 4K motor units with full recruitment. No fibrillations or positive waves were seen. The peroneus tertius muscle reveals 2 to 4K motor units with decreased recruitment. No fibrillations or positive waves were seen. The medial gastrocnemius muscle reveals 1 to 3K motor units with full recruitment. No fibrillations or positive waves were seen. The vastus lateralis muscle reveals 2 to 4K motor units with full recruitment. No fibrillations or positive waves were seen. The iliopsoas muscle reveals 2 to 4K motor units with full recruitment. No fibrillations or positive waves were seen. The biceps femoris muscle (long head) reveals 2 to 4K motor units with full recruitment. No fibrillations or positive waves were seen. The lumbosacral paraspinal muscles were tested at 3 levels, and revealed no abnormalities of insertional activity at all 3 levels tested. There was good relaxation.   IMPRESSION:  Nerve conduction studies done on both lower extremities were within normal limits, no evidence of a peripheral neuropathy was seen.  EMG evaluation of the left  lower extremity shows mild isolated chronic signs of denervation in the peroneus tertius muscle which is of unclear clinical significance, a definite L5 or S1 radiculopathy cannot be confirmed from this study.  Marlan Palau MD 12/11/2017 1:42 PM  [facility] [address] [place], [place] [address]  Phone [phone] Fax [phone]

## 2017-12-11 NOTE — Progress Notes (Signed)
Please refer to EMG and nerve conduction study procedure note. 

## 2017-12-12 ENCOUNTER — Telehealth: Payer: Self-pay

## 2017-12-12 NOTE — Telephone Encounter (Signed)
Notes recorded by Hildred Alamin, RN on 12/12/2017 at 12:44 PM EDT Lft detail message on patients vm that stated her name. Rn left vm that nerve conduction test was unremarkable. No nerve damage or neuropathy. Continue treatment plan. Rn left [facility] number if she had any questions.

## 2017-12-12 NOTE — Telephone Encounter (Signed)
-----   Message from Marvel Plan, MD sent at 12/11/2017  6:06 PM EDT ----- Could you please let the patient know that the nerve conduction test done recently in our office was unremarkable. No obvious nerve damage or neuropathy. Please continue current treatment. Thanks.  Marvel Plan, MD PhD Stroke Neurology 12/11/2017 6:05 PM

## 2017-12-25 ENCOUNTER — Telehealth: Payer: Self-pay

## 2017-12-25 ENCOUNTER — Encounter: Payer: Self-pay | Admitting: Neurology

## 2017-12-25 ENCOUNTER — Ambulatory Visit: Payer: Medicaid Other | Admitting: Neurology

## 2017-12-25 VITALS — BP 129/71 | HR 74 | Ht 61.0 in | Wt 187.4 lb

## 2017-12-25 DIAGNOSIS — R55 Syncope and collapse: Secondary | ICD-10-CM

## 2017-12-25 DIAGNOSIS — G43709 Chronic migraine without aura, not intractable, without status migrainosus: Secondary | ICD-10-CM

## 2017-12-25 DIAGNOSIS — G44209 Tension-type headache, unspecified, not intractable: Secondary | ICD-10-CM

## 2017-12-25 DIAGNOSIS — Z8673 Personal history of transient ischemic attack (TIA), and cerebral infarction without residual deficits: Secondary | ICD-10-CM | POA: Diagnosis not present

## 2017-12-25 NOTE — Telephone Encounter (Signed)
Patient said [facility] doctor is [facility] in [place] telephone number [phone].

## 2017-12-25 NOTE — Telephone Encounter (Signed)
Rn call [facility] at [phone]. Fax for office is [phone]. Fax twice and receive.

## 2017-12-25 NOTE — Patient Instructions (Addendum)
-   continue ASA and lipitor for stroke prevention - continue carvediolo at 12.5mg . Check BP daily and record. Goal BP 120-140 - continue topamax for HA prevention.  - will request eye doctor report for recent eye exam.  - left leg dragging is likely chronic and avoid over exertion.  - Follow up with your primary care physician for stroke risk factor modification. Recommend maintain blood pressure goal <130/80, diabetes with hemoglobin A1c goal below 7.0% and lipids with LDL cholesterol goal below 70 mg/dL.  - follow with cardiology as scheduled - healthy diet and regular exercises - keep hydration, stay cool, avoid dehydration in the summer months.  - follow up as needed.

## 2017-12-25 NOTE — Progress Notes (Signed)
NEUROLOGY CLINIC FOLLOW UP NOTE  NAME: Megan Miller DOB: 07/28/1956 REFERRING PHYSICIAN: Mayo, Allyn Kenner, MD  I saw Megan Miller as a new consult in the neurovascular clinic today regarding  Chief Complaint  Patient presents with  . Follow-up    Stroke follow up pt is with Megan Miller friend  .  HPI: Megan Miller is a 62 y.o. female with PMH of HTN, CAD, LBP who presents as a new patient for HA and leg weakness.   Pt stated that she had left foot drop and HA for about 6 months. Went to see PCP and recommend to go to ER on 11/01/17. She had MRI showed old right caudate head, left thalamus and b/l cerebellar lacunar infarcts. TTE in 06/2017 with EF 60-65%. LDL 74 and A1C 5.6. She was referred here for further evaluation.   Has followed with cardiology for CP and recurrent syncope episodes. On 11/06/15 patient was in the car in the parking lot, husband found her passed out.  EMS called, patient gradually regained consciousness, no postictal, blamed husband for calling EMS, refused to go to ER.  2 days later, she went to the ER for further evaluation, CT head negative. 05/02/17 she was driving, started to feel blurry vision, dizziness and diaphoresis.  Pulled over and husband found her passed out again, but gradually regained consciousness with EMS on site.  Again complaining husband called EMS, went to the ER second day was told nothing wrong. One month ago, she was at home felt dizzy, and she sat down, gradually feeling good, did not go to hospital. Followed with cardiology and had 30 day cardiac event monitoring showed no afib or other arrythmia. Dr. Anne Fu also requested neurology referral to rule out seizure.    Has chronic LBP and leg pain. MRI L-spine on 11/01/17 showed L4-5 disc protrusion with right L4 impingement. L3-4 disc protrusion with b/l L4 impingement. She has been have left leg mild dragging for 6 months, especially if tired or fatigue.   She also stated that she started  to have HA in her 30s. HA on and off, once a month, bilateral frontal, sharp pain, lasting 2hr average, sometimes with photo- or phonophobia, N/V or need dark room lie down. However, since beginning of the year, her HA more frequent, once a day, throbbing pain. No vision changes.   BP today 100/61.  On carvedilol 25 mg twice daily, lisinopril 20 mg and HCTZ 12.5 mg daily for BP control at home medication.   Interval history During the interval time, pt has been doing well. No HA or syncope episodes. Tolerating topamax well. She had follow up with her optometrist last month and was told eye exam normal including fundi exam. Her work up with CTA head and neck, EEG and EMG all normal. Pt still has subtle left leg dragging especially when tired, but this has been chronic for 3-4 years. Husband said every year during summer when she was over heated with hot flashes, she would pass out. Last visit, we decreased her coreg in half and her BP today much improved from last visit. Today BP 129/77.   Past Medical History:  Diagnosis Date  . Allergic conjunctivitis   . Chest pain   . Chronic back pain   . Constipation, chronic   . Dyspnea on exertion   . Dysthymic   . GERD (gastroesophageal reflux disease)   . Hypertension   . Leg edema    Asymetric Right leg (dopplers negative)  .  Overweight (BMI 25.0-29.9)   . Stroke (HCC)   . Urination frequency    Past Surgical History:  Procedure Laterality Date  . 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 BILATERAL SALPINGOOPHORECTOMY  05/08/2001   Path=extensive endometriosis  . BREAST SURGERY  12/29/2003   Right breast core biopsy=benign fibrocystic change  . SMALL INTESTINE SURGERY  03/30/2001   Rectosigmoid bowel resection and reanastomosis   Family History  Problem Relation Age of Onset  . Hypertension Mother   . Diabetes Father   . Diabetes Sister   . Asthma Brother   . Diabetes Brother   . Kidney disease Brother   . Colon cancer  Neg Hx   . Colon polyps Neg Hx   . Esophageal cancer Neg Hx   . Breast cancer Neg Hx    Current Outpatient Medications  Medication Sig Dispense Refill  . ACIPHEX 20 MG tablet TAKE 1 TABLET (20 MG TOTAL) BY MOUTH DAILY. PLEASE PROVIDE BRAND NAME. 90 tablet 0  . aspirin 81 MG tablet Take 1 tablet (81 mg total) by mouth daily. 90 tablet 3  . atorvastatin (LIPITOR) 40 MG tablet Take 1 tablet (40 mg total) by mouth daily. 90 tablet 3  . carvedilol (COREG) 25 MG tablet Take 1 tablet (25 mg total) by mouth 2 (two) times daily. 60 tablet 5  . cetirizine (ZYRTEC) 10 MG tablet Take 1 tablet (10 mg total) by mouth daily. 30 tablet 1  . desonide (DESOWEN) 0.05 % cream Apply topically 2 (two) times daily. 30 g 0  . diphenhydrAMINE (BENADRYL) 25 mg capsule Take 1 capsule (25 mg total) by mouth every 6 (six) hours as needed. 30 capsule 0  . ibuprofen (ADVIL,MOTRIN) 800 MG tablet Take 1 tablet (800 mg total) by mouth every 8 (eight) hours as needed. 60 tablet 2  . ipratropium (ATROVENT) 0.06 % nasal spray Place 2 sprays into both nostrils 4 (four) times daily. 15 mL 0  . lisinopril-hydrochlorothiazide (PRINZIDE,ZESTORETIC) 20-12.5 MG tablet Take 1 tablet by mouth daily. 60 tablet 11  . minoxidil (ROGAINE) 2 % external solution Apply topically 2 (two) times daily. 60 mL 0  . nitroGLYCERIN (NITRODUR - DOSED IN MG/24 HR) 0.2 mg/hr patch APPLY 1/4 PATCH TO AFFECTED AREA. CHANGE PATCH EVERY 24 HOURS. (Patient taking differently: APPLY 1/4 PATCH TO AFFECTED AREA, USES AS NEEDED FOR CHEST PAIN.) 30 patch 1  . nystatin cream (MYCOSTATIN) Apply 1 application topically 2 (two) times daily. 30 g 0  . olopatadine (PATANOL) 0.1 % ophthalmic solution Place 1 drop into both eyes 2 (two) times daily. For allergy symptoms     . omeprazole (PRILOSEC) 40 MG capsule Take 1 capsule (40 mg total) by mouth daily. 30 capsule 0  . ondansetron (ZOFRAN) 4 MG tablet Take 1 tablet (4 mg total) by mouth every 8 (eight) hours as needed for  nausea or vomiting. 10 tablet 0  . PAZEO 0.7 % SOLN Place into the right eye daily.   3  . polyethylene glycol powder (GLYCOLAX/MIRALAX) powder Dissolve 17 grams in at least 8 ounces water/juice and drink daily 527 g 4  . Potassium Chloride ER 20 MEQ TBCR Take 20 mEq by mouth 2 (two) times daily at 10 AM and 5 PM. 6 tablet 0  . RESTASIS 0.05 % ophthalmic emulsion Place 1 drop into the right eye daily.   3  . sertraline (ZOLOFT) 50 MG tablet Take 1 tablet (50 mg total) by mouth daily. 30 tablet 3  .  simethicone (GAS-X) 80 MG chewable tablet Chew 1 tablet (80 mg total) by mouth every 6 (six) hours as needed for flatulence. 30 tablet 0  . topiramate (TOPAMAX) 25 MG tablet 25mg  nightly for 7 days then 25mg  bid for 7 days and then 50mg  bid. 120 tablet 3  . triamcinolone ointment (KENALOG) 0.1 % Apply 1 application topically 2 (two) times daily. 80 g 0  . Zoster Vaccine Live, PF, (ZOSTAVAX) [address] UNT/0.65ML injection Inject 19,400 Units into the skin once. 1 each 0   No current facility-administered medications for this visit.    Allergies  Allergen Reactions  . Percocet [Oxycodone-Acetaminophen] Nausea And Vomiting  . Codeine Phosphate Itching    REACTION: Denies rash or allergic reaction.   Social History   Socioeconomic History  . Marital status: Divorced    Spouse name: Not on file  . Number of children: 0  . Years of education: Not on file  . Highest education level: Not on file  Occupational History  . Occupation: Disability  Social Needs  . Financial resource strain: Not on file  . Food insecurity:    Worry: Not on file    Inability: Not on file  . Transportation needs:    Medical: Not on file    Non-medical: Not on file  Tobacco Use  . Smoking status: Never Smoker  . Smokeless tobacco: Never Used  Substance and Sexual Activity  . Alcohol use: No    Alcohol/week: 0.0 oz  . Drug use: No  . Sexual activity: Never    Birth control/protection: Abstinence  Lifestyle  .  Physical activity:    Days per week: Not on file    Minutes per session: Not on file  . Stress: Not on file  Relationships  . Social connections:    Talks on phone: Not on file    Gets together: Not on file    Attends religious service: Not on file    Active member of club or organization: Not on file    Attends meetings of clubs or organizations: Not on file    Relationship status: Not on file  . Intimate partner violence:    Fear of current or ex partner: Not on file    Emotionally abused: Not on file    Physically abused: Not on file    Forced sexual activity: Not on file  Other Topics Concern  . Not on file  Social History Narrative  . Not on file    Review of Systems Full 14 system review of systems performed and notable only for those listed, all others are neg:  Constitutional:   Cardiovascular:  Ear/Nose/Throat:   Skin:  Eyes:   Respiratory:   Gastroitestinal:   Genitourinary:  Hematology/Lymphatic:   Endocrine:  Musculoskeletal:   Allergy/Immunology:   Neurological:   Psychiatric:  Sleep:    Physical Exam  Vitals:   12/25/17 0907  BP: 129/71  Pulse: 74    General - Well nourished, well developed, in no apparent distress.  Ophthalmologic - fundi  visualized due to small pupils.  Cardiovascular - Regular rate and rhythm.  Neck - supple, no nuchal rigidity.  Mental Status -  Level of arousal and orientation to time, place, and person were intact. Language including expression, naming, repetition, comprehension, reading, and writing was assessed and found intact. Attention span and concentration were normal. Recent and remote memory were intact. Fund of Knowledge was assessed and was intact.  Cranial Nerves II - XII - II -  Visual field intact OU. III, IV, VI - Extraocular movements intact. V - Facial sensation intact bilaterally. VII - Facial movement intact bilaterally. VIII - Hearing & vestibular intact bilaterally. X - Palate elevates  symmetrically. XI - Chin turning & shoulder shrug intact bilaterally. XII - Tongue protrusion intact.  Motor Strength - The patient's strength was normal in all extremities and pronator drift was absent.  Bulk was normal and fasciculations were absent.   Motor Tone - Muscle tone was assessed at the neck and appendages and was normal.  Reflexes - The patient's reflexes were normal in all extremities and she had no pathological reflexes.  Sensory - Light touch, temperature/pinprick, vibration and proprioception, and Romberg testing were assessed and were normal.    Coordination - The patient had normal movements in the hands and feet with no ataxia or dysmetria.  Tremor was absent.  Gait and Station - The patient's transfers, posture, gait, station, and turns were observed as normal.   Imaging  I have personally reviewed the radiological images below and agree with the radiology interpretations.  Mr Brain Wo Contrast (neuro Protocol)  Result Date: 11/01/2017 CLINICAL DATA:  Intermittent headache.  History of hypertension. EXAM: MRI HEAD WITHOUT CONTRAST TECHNIQUE: Multiplanar, multiecho pulse sequences of the brain and surrounding structures were obtained without intravenous contrast. COMPARISON:  CT HEAD November 06, 2015 FINDINGS: INTRACRANIAL CONTENTS: No reduced diffusion to suggest acute ischemia. No susceptibility artifact to suggest hemorrhage. The ventricles and sulci are normal for patient's age. Patchy supratentorial white matter FLAIR T2 hyperintensities. Old small bilateral cerebellar infarcts. Old bilateral thalamus and RIGHT basal ganglia lacunar infarcts. No suspicious parenchymal signal, masses, mass effect. No abnormal extra-axial fluid collections. No extra-axial masses. VASCULAR: Normal major intracranial vascular flow voids present at skull base. SKULL AND UPPER CERVICAL SPINE: No abnormal sellar expansion. No suspicious calvarial bone marrow signal. Craniocervical junction  maintained. SINUSES/ORBITS: The mastoid air-cells and included paranasal sinuses are well-aerated.The included ocular globes and orbital contents are non-suspicious. OTHER: None. IMPRESSION: 1. No acute intracranial process. 2. Old RIGHT basal ganglia, bilateral thalamus and bilateral cerebellar small infarcts. 3. Mild chronic small vessel ischemic disease. Electronically Signed   By: Awilda Metro M.D.   On: 11/01/2017 21:12   Mr Lumbar Spine Wo Contrast  Result Date: 11/01/2017 CLINICAL DATA:  Foot drop for 1 year. EXAM: MRI LUMBAR SPINE WITHOUT CONTRAST TECHNIQUE: Multiplanar, multisequence MR imaging of the lumbar spine was performed. No intravenous contrast was administered. COMPARISON:  None. FINDINGS: SEGMENTATION: For the purposes of this report, the last well-formed intervertebral disc is reported as L5-S1. ALIGNMENT: Maintained lumbar lordosis. Minimal grade 1 L3-4 anterolisthesis. No spondylolysis. Mild lower lumbar dextroscoliosis on this nonweightbearing examination. VERTEBRAE:Vertebral bodies are intact. Mild L3-4 and L4-5 disc height loss with mild desiccation. Developmentally smaller L5-S1 disc. No acute or abnormal bone marrow signal. CONUS MEDULLARIS AND CAUDA EQUINA: Conus medullaris terminates at L1-2 and demonstrates normal morphology and signal characteristics. Central displacement of the cauda equina due to canal stenosis, otherwise normal. PARASPINAL AND OTHER SOFT TISSUES: Nonacute. Mild symmetric paraspinal muscle atrophy. DISC LEVELS: L1-2: Annular bulging and small central disc protrusion without canal stenosis or neural foraminal narrowing. L2-3: Annular bulging. Mild facet arthropathy and ligamentum flavum redundancy without canal stenosis or neural foraminal narrowing. L3-4: Anterolisthesis. 6 mm central disc protrusion versus extrusion without migration, surrounding low signal favoring blood products. Moderate to severe LEFT and moderate RIGHT facet arthropathy and ligamentum  flavum redundancy. Moderate to severe canal stenosis, AP dimension  of thecal sac is 4 mm with lateral recess narrowing which may affect the traversing L4 nerves. Mild LEFT neural foraminal narrowing. L4-5: RIGHT central 6 x 7 mm disc extrusion with 6 mm contiguous superior migration contacting and posteriorly displacing the traversing RIGHT L4 nerve. Severe facet arthropathy and ligamentum flavum redundancy. Moderate canal stenosis. Minimal neural foraminal narrowing. L5-S1: No disc bulge. Moderate RIGHT, severe LEFT facet arthropathy without canal stenosis or neural foraminal narrowing. IMPRESSION: 1. Minimal grade 1 L4-5 anterolisthesis without spondylolysis. No fracture or acute osseous process. 2. 6 x 7 x 6 mm L4-5 disc extrusion resulting in traversing RIGHT L4 nerve impingement. 6 mm L3-4 disc protrusion versus extrusion could affect the traversing bilateral L4 nerves. 3. Moderate to severe canal stenosis L3-4, moderate at L4-5. 4. Minimal to mild L3-4 and L4-5 neural foraminal narrowing. Electronically Signed   By: Awilda Metro M.D.   On: 11/01/2017 21:23   Lab Review Component     Latest Ref Rng & Units 11/01/2017 11/07/2017  Opiates     NONE DETECTED NONE DETECTED   COCAINE     NONE DETECTED NONE DETECTED   Benzodiazepines     NONE DETECTED NONE DETECTED   Amphetamines     NONE DETECTED NONE DETECTED   Tetrahydrocannabinol     NONE DETECTED NONE DETECTED   Barbiturates     NONE DETECTED NONE DETECTED   Cholesterol, Total     100 - 199 mg/dL  161  Triglycerides     0 - 149 mg/dL  73  HDL Cholesterol     >39 mg/dL  57  VLDL Cholesterol Cal     5 - 40 mg/dL  15  LDL (calc)     0 - 99 mg/dL  74  Total CHOL/HDL Ratio     0.0 - 4.4 ratio  2.6  Hemoglobin A1C       5.6  TSH     0.450 - 4.500 uIU/mL  2.140     Assessment   In summary, Megan Miller is a 62 y.o. female with PMH of HTN, CAD and LBP follow up in clinic for multiple neurological issues.   1. Old strokes on  MRI - location of right caudate head, left thalamus and b/l cerebellar lacunar infarcts consistent with small vessel disease. LDL 74 and A1C 5.6. Continue ASA and lipitor. CTA head and neck unremarkable. Stroke risk modification to continue  2. Recurrent syncope - all in summer time with over heating, concerns for low BP related or dehydration or vasovagal. Her BP last visit 100/61. She was on high dose coreg, also on lisinopril and HCTZ. Decrease coreg to 12.5mg  bid and BP much better today 129/77. Seizure less likely and EEG neg  3. Left leg dragging - chronic for 3-4 years, mild, may associated with back pain or old strokes seen on the MRI. EMG/NCS unremarkable  4. Headache - likely migraine HA, but some features not very typical. Followed with optometrist and was told normal fundi. No vision changes, IIH or VA less likely. Tension HA in DDx. On topamax, no more HA so far.  Plan: - continue ASA and lipitor for stroke prevention - continue carvediolo at 12.5mg . Check BP daily and record. Goal BP 120-140 - continue topamax for HA prevention.  - will request eye doctor report for recent eye exam.  - left leg dragging is likely chronic and avoid over exertion.  - Follow up with your primary care physician for stroke risk factor modification.  Recommend maintain blood pressure goal <130/80, diabetes with hemoglobin A1c goal below 7.0% and lipids with LDL cholesterol goal below 70 mg/dL.  - follow with cardiology as scheduled - healthy diet and regular exercises - keep hydration, stay cool, avoid dehydration in the summer months.  - follow up as needed.   I spent more than 25 minutes of face to face time with the patient. Greater than 50% of time was spent in counseling and coordination of care. We discussed multiple medical issues listed above.  No orders of the defined types were placed in this encounter.   No orders of the defined types were placed in this encounter.   Patient Instructions   - continue ASA and lipitor for stroke prevention - continue carvediolo at 12.5mg . Check BP daily and record. Goal BP 120-140 - continue topamax for HA prevention.  - will request eye doctor report for recent eye exam.  - left leg dragging is likely chronic and avoid over exertion.  - Follow up with your primary care physician for stroke risk factor modification. Recommend maintain blood pressure goal <130/80, diabetes with hemoglobin A1c goal below 7.0% and lipids with LDL cholesterol goal below 70 mg/dL.  - follow with cardiology as scheduled - healthy diet and regular exercises - keep hydration, stay cool, avoid dehydration in the summer months.  - follow up as needed.    Marvel Plan, MD PhD [facility] [address], [address] [place], [place] [address] [phone]

## 2017-12-25 NOTE — Telephone Encounter (Signed)
Left vm for patient to call back about eye doctor. Rn stated [facility] is not online.Rn needs to clarify the name.

## 2017-12-26 ENCOUNTER — Other Ambulatory Visit: Payer: Self-pay | Admitting: Internal Medicine

## 2018-01-01 ENCOUNTER — Telehealth: Payer: Self-pay | Admitting: Internal Medicine

## 2018-01-01 NOTE — Telephone Encounter (Signed)
Will forward to pcp, but pt probably needs an apt.

## 2018-01-01 NOTE — Telephone Encounter (Signed)
Pt thinks she has fluid on her knee. Is there a pill she can take to help with this. Please advise

## 2018-01-02 ENCOUNTER — Telehealth: Payer: Self-pay

## 2018-01-02 NOTE — Telephone Encounter (Signed)
Received fax from [facility] requesting prior authorization of Aciphex. Form placed in PCPs box for completion along with Medicaid formulary. Ples SpecterAlisa Brake, RN [facility] RN)

## 2018-01-03 ENCOUNTER — Telehealth: Payer: Self-pay | Admitting: Family Medicine

## 2018-01-03 NOTE — Telephone Encounter (Signed)
She should definitely have an appointment for this. Thanks!

## 2018-01-03 NOTE — Telephone Encounter (Signed)
LVM for patient to call the office to schedule with pcp to discuss. Please assist her in scheduling with [facility] when she call back, thanks!

## 2018-01-03 NOTE — Telephone Encounter (Signed)
**  After Hours/ Emergency Line Call**  Received a call to report that Megan Miller requesting refill of aciphex as she has not yet heard back regarding prior auth. Explained that patient should call clinic back during business hours on Monday. Patient voiced good understanding.  Leland HerElsia J Shilpa Bushee, DO PGY-2, [facility] 01/03/2018 6:41 PM

## 2018-01-08 ENCOUNTER — Ambulatory Visit: Payer: Medicaid Other | Admitting: Internal Medicine

## 2018-01-08 ENCOUNTER — Other Ambulatory Visit: Payer: Self-pay

## 2018-01-08 ENCOUNTER — Encounter: Payer: Self-pay | Admitting: Family Medicine

## 2018-01-08 ENCOUNTER — Ambulatory Visit: Payer: Medicaid Other | Admitting: Family Medicine

## 2018-01-08 DIAGNOSIS — I639 Cerebral infarction, unspecified: Secondary | ICD-10-CM

## 2018-01-08 DIAGNOSIS — R2681 Unsteadiness on feet: Secondary | ICD-10-CM | POA: Diagnosis present

## 2018-01-08 MED ORDER — ACIPHEX 20 MG PO TBEC: DELAYED_RELEASE_TABLET | ORAL | 0 refills | Status: DC

## 2018-01-08 NOTE — Telephone Encounter (Signed)
Prior approval for Aciphex completed via [employer] [employer].  Med approved for 01/08/18 - 01/08/2019.  Prior approval # [id number].  [facility] informed.  Ples SpecterAlisa Brake, RN [facility] RN)

## 2018-01-08 NOTE — Patient Instructions (Addendum)
Good to see you today!  Thanks for coming in.  I think you leg heaviness is from the stroke.  Will refer your to Physical Therapy who will contact you about an appointment   I sent in refill for your aciphex  See your new doctor in 2 months

## 2018-01-08 NOTE — Assessment & Plan Note (Signed)
Seems related to recent CVA.  No signs of joint problem.  Will refer to Physical Therapy

## 2018-01-08 NOTE — Assessment & Plan Note (Signed)
Seems stable.  Reviewed neuro note.  Is on her prevention medications and blood pressure controlled today

## 2018-01-08 NOTE — Progress Notes (Signed)
Subjective  Megan Miller is a 62 y.o. female is presenting with the following  LEFT LEG HEAVINESS - GAIT INSTABILITY Feels her left knee or leg is heavy and sort of numb.  No specific pain or redness or soft tissue swelling or injury. Did have recent CVA.   No falls but does feels somewhat unsteady   CVA Seen by Dr Nancy MarusMayo in April and since by neurosurgery and neurology.   Has plans to follow up with NS.   Is taking all her blood pressure medications and asa.  No new weakness or symptoms.  Has not seen Physical Therapy    Chief Complaint noted Review of Symptoms - see HPI PMH - Smoking status noted.    Objective Vital Signs reviewed BP 134/74   Pulse 73   Temp 97.8 F (36.6 C) (Oral)   Wt 186 lb (84.4 kg)   SpO2 98%   BMI 35.14 kg/m  Able to walk around by herself without aids but swings left leg and seems mildly ataxic Able to do half way deep knee bend with balance assist Able to get up and down from exam table without assistance L Knee Hip Ankle - FROM nontender no effusion or redness     Assessments/Plans  See after visit summary for details of patient instuctions  Cerebrovascular accident (CVA) (HCC) Seems stable.  Reviewed neuro note.  Is on her prevention medications and blood pressure controlled today   Gait instability Seems related to recent CVA.  No signs of joint problem.  Will refer to Physical Therapy

## 2018-01-10 ENCOUNTER — Telehealth: Payer: Self-pay | Admitting: Internal Medicine

## 2018-01-10 NOTE — Telephone Encounter (Signed)
Pt called and said she would like to know if she could have a referral to [facility]. She said she has been seen for her knees and they are not getting better. Pt would like to be contacted.

## 2018-01-13 ENCOUNTER — Other Ambulatory Visit: Payer: Self-pay | Admitting: Internal Medicine

## 2018-01-13 DIAGNOSIS — M25561 Pain in right knee: Principal | ICD-10-CM

## 2018-01-13 DIAGNOSIS — G8929 Other chronic pain: Secondary | ICD-10-CM

## 2018-01-13 DIAGNOSIS — M25562 Pain in left knee: Principal | ICD-10-CM

## 2018-01-13 NOTE — Telephone Encounter (Signed)
Referral placed for [facility] to be [facility]. Thanks!

## 2018-02-11 ENCOUNTER — Telehealth: Payer: Self-pay | Admitting: Family Medicine

## 2018-02-11 NOTE — Telephone Encounter (Signed)
Pt called because [facility] said that [facility] do not do PT. Can we change her referral to PT and [facility]

## 2018-02-11 NOTE — Telephone Encounter (Signed)
There was a referral placed in June for Physical therapy also.  Will forward to referral coordinator to check on status of referral. Avrom Robarts,CMA

## 2018-02-13 ENCOUNTER — Ambulatory Visit: Payer: Medicaid Other | Admitting: Family Medicine

## 2018-02-13 ENCOUNTER — Encounter: Payer: Self-pay | Admitting: Family Medicine

## 2018-02-13 ENCOUNTER — Other Ambulatory Visit: Payer: Self-pay

## 2018-02-13 VITALS — BP 122/70 | HR 72 | Temp 98.4°F | Wt 190.0 lb

## 2018-02-13 DIAGNOSIS — L304 Erythema intertrigo: Secondary | ICD-10-CM

## 2018-02-13 MED ORDER — NYSTATIN 100000 UNIT/GM EX CREA: 1.0000 "application " | TOPICAL_CREAM | Freq: Two times a day (BID) | CUTANEOUS | 0 refills | Status: DC

## 2018-02-13 NOTE — Assessment & Plan Note (Signed)
1 year's experience itching under her breast along her bra line.  She states that she has been seen and given a cream (does not know what) for this itchiness.  She has been using this cream for most of the year and it has not fixed the itchiness.  We will presume that she has been taking a topical steroid which may have helped her intertrigo initially but has perhaps exacerbated possible fungal infection. Patient has been instructed to: 1- stop taking her cream at home for this rash 2- apply nystatin cream twice daily to her rash 3- after applying nystatin apply baby powder or appropriate substitute to promote dryness Patient was encouraged to call back if her symptoms do not resolve in 4 weeks.

## 2018-02-13 NOTE — Patient Instructions (Addendum)
Visit we discussed the itchiness that you been having around her bra line for the past year.  I know that you have been taking cream for that the past year does not seem to be helping.  Below is what we decided to do this itchiness.  1-stop applying the cream you have at home 2- apply the nystatin cream to itchy area two times daily 3- apply the drying powder (half baby powder half cornstarch) after the nystatin cream daily.  Please return for visit if the symptoms have not improved in 4 weeks.

## 2018-02-13 NOTE — Progress Notes (Signed)
    Subjective:  Megan Miller is a 62 y.o. female [facility] with a chief complaint of itching under breasts.   HPI: Intertrigo: This patient was seen today for itchiness at her bra line that is been bothering her for about the past year.  During the past year she has been seen outside in our practice and was given a cream to apply daily.  She is been using this cream for most of the past year without improvement.  She does not recall any other associated symptoms.   Objective:  Physical Exam: BP 122/70   Pulse 72   Temp 98.4 F (36.9 C) (Oral)   Wt 190 lb (86.2 kg)   SpO2 98%   BMI 35.90 kg/m   Gen: NAD, resting comfortably CV: RRR with no murmurs appreciated Pulm: NWOB, CTAB with no crackles, wheezes, or rhonchi GI: Soft, Nontender, Nondistended. MSK: no edema, cyanosis, or clubbing noted Skin: Skin fold underneath her breasts appeared moist and did not appear to be inflamed or erythematous or excoriated.  No pustules, nodules or bullae were seen.  There was no drainage or weeping evident.  Skin on and around the itching area appeared grossly normal. Neuro: grossly normal, moves all extremities Psych: Normal affect and thought content  No results found for this or any previous visit (from the past 72 hour(s)).   Assessment/Plan:  Intertrigo 1 year's experience itching under her breast along her bra line.  She states that she has been seen and given a cream (does not know what) for this itchiness.  She has been using this cream for most of the year and it has not fixed the itchiness.  We will presume that she has been taking a topical steroid which may have helped her intertrigo initially but has perhaps exacerbated possible fungal infection. Patient has been instructed to: 1- stop taking her cream at home for this rash 2- apply nystatin cream twice daily to her rash 3- after applying nystatin apply baby powder or appropriate substitute to promote  dryness Patient was encouraged to call back if her symptoms do not resolve in 4 weeks.

## 2018-03-04 ENCOUNTER — Ambulatory Visit: Payer: Medicaid Other | Admitting: Family Medicine

## 2018-03-04 ENCOUNTER — Encounter: Payer: Self-pay | Admitting: Family Medicine

## 2018-03-04 ENCOUNTER — Other Ambulatory Visit: Payer: Self-pay

## 2018-03-04 VITALS — BP 153/82 | HR 75 | Temp 98.2°F | Wt 188.0 lb

## 2018-03-04 DIAGNOSIS — I10 Essential (primary) hypertension: Secondary | ICD-10-CM | POA: Diagnosis not present

## 2018-03-04 DIAGNOSIS — R252 Cramp and spasm: Secondary | ICD-10-CM | POA: Insufficient documentation

## 2018-03-04 DIAGNOSIS — R600 Localized edema: Secondary | ICD-10-CM | POA: Diagnosis not present

## 2018-03-04 NOTE — Assessment & Plan Note (Signed)
Chronic for more than 1 yrs. No cardiopulm symptoms. ?? Venous Insufficiency. Reduce salf intake, elevate LL and wear compression stockings recommended. F/U with PCP soon for further management.

## 2018-03-04 NOTE — Assessment & Plan Note (Signed)
Etiology unclear. Differentials include dehydration, electrolyte imbalance vs PAD. Checked Bmet, Magnesium today. Consider ABI in the future if there is no improvement or symptom is worsening. Keep well hydrated. Muscle relaxant prn cramp. She stated she has a muscle relaxant at home, could not tell the name.

## 2018-03-04 NOTE — Progress Notes (Signed)
Subjective:     Patient ID: Megan Miller, female   DOB: 01-May-1956, 62 y.o.   MRN: [id number]  Leg Pain   Incident onset: Started 1 month ago. Incident location: No injury. There was no injury mechanism. Pain location: B/L calf pain more on the right. No calf swelling. The quality of the pain is described as cramping (Denies any pain or cramping now). The pain is at a severity of 0/10 (Whenever she is cramping it is about 9/10 in severity). The pain is moderate. The pain has been intermittent since onset. Pertinent negatives include no inability to bear weight, loss of motion, loss of sensation, muscle weakness, numbness or tingling. The symptoms are aggravated by weight bearing. Treatments tried: Ibuprofen prn. The treatment provided moderate relief.  HTN: Did not take her meds this morning. Right foot swelling: Going on for more than 1 year, denies pain. She eats a lot of salt in her diet but now cutting back. No SOB, no chest pain, no fatigue.   Current Outpatient Medications on File Prior to Visit  Medication Sig Dispense Refill  . ACIPHEX 20 MG tablet TAKE 1 TABLET (20 MG TOTAL) BY MOUTH DAILY. PLEASE PROVIDE BRAND NAME. 90 tablet 0  . aspirin 81 MG tablet Take 1 tablet (81 mg total) by mouth daily. 90 tablet 3  . atorvastatin (LIPITOR) 40 MG tablet Take 1 tablet (40 mg total) by mouth daily. 90 tablet 3  . carvedilol (COREG) 25 MG tablet Take 1 tablet (25 mg total) by mouth 2 (two) times daily. 60 tablet 5  . desonide (DESOWEN) 0.05 % cream Apply topically 2 (two) times daily. 30 g 0  . ibuprofen (ADVIL,MOTRIN) 800 MG tablet Take 1 tablet (800 mg total) by mouth every 8 (eight) hours as needed. 60 tablet 2  . lisinopril-hydrochlorothiazide (PRINZIDE,ZESTORETIC) 20-12.5 MG tablet Take 1 tablet by mouth daily. 60 tablet 11  . nitroGLYCERIN (NITRODUR - DOSED IN MG/24 HR) 0.2 mg/hr patch APPLY 1/4 PATCH TO AFFECTED AREA. CHANGE PATCH EVERY 24 HOURS. (Patient taking differently: APPLY 1/4  PATCH TO AFFECTED AREA, USES AS NEEDED FOR CHEST PAIN.) 30 patch 1  . nystatin cream (MYCOSTATIN) Apply 1 application topically 2 (two) times daily. 30 g 0  . olopatadine (PATANOL) 0.1 % ophthalmic solution Place 1 drop into both eyes 2 (two) times daily. For allergy symptoms     . ondansetron (ZOFRAN) 4 MG tablet Take 1 tablet (4 mg total) by mouth every 8 (eight) hours as needed for nausea or vomiting. 10 tablet 0  . PAZEO 0.7 % SOLN Place into the right eye daily.   3  . polyethylene glycol powder (GLYCOLAX/MIRALAX) powder Dissolve 17 grams in at least 8 ounces water/juice and drink daily 527 g 4  . RESTASIS 0.05 % ophthalmic emulsion Place 1 drop into the right eye daily.   3  . cetirizine (ZYRTEC) 10 MG tablet Take 1 tablet (10 mg total) by mouth daily. (Patient not taking: Reported on 03/04/2018) 30 tablet 1  . ipratropium (ATROVENT) 0.06 % nasal spray Place 2 sprays into both nostrils 4 (four) times daily. 15 mL 0  . minoxidil (ROGAINE) 2 % external solution Apply topically 2 (two) times daily. (Patient not taking: Reported on 03/04/2018) 60 mL 0  . omeprazole (PRILOSEC) 40 MG capsule Take 1 capsule (40 mg total) by mouth daily. (Patient not taking: Reported on 03/04/2018) 30 capsule 0  . Potassium Chloride ER 20 MEQ TBCR Take 20 mEq by mouth 2 (two) times  daily at 10 AM and 5 PM. (Patient not taking: Reported on 03/04/2018) 6 tablet 0  . sertraline (ZOLOFT) 50 MG tablet Take 1 tablet (50 mg total) by mouth daily. (Patient not taking: Reported on 03/04/2018) 30 tablet 3  . simethicone (GAS-X) 80 MG chewable tablet Chew 1 tablet (80 mg total) by mouth every 6 (six) hours as needed for flatulence. 30 tablet 0  . topiramate (TOPAMAX) 25 MG tablet 25mg  nightly for 7 days then 25mg  bid for 7 days and then 50mg  bid. 120 tablet 3  . triamcinolone ointment (KENALOG) 0.1 % Apply 1 application topically 2 (two) times daily. 80 g 0  . Zoster Vaccine Live, PF, (ZOSTAVAX) 16109 UNT/0.65ML injection Inject 19,400  Units into the skin once. 1 each 0   No current facility-administered medications on file prior to visit.    Past Medical History:  Diagnosis Date  . Allergic conjunctivitis   . Chest pain   . Chronic back pain   . Constipation, chronic   . Dyspnea on exertion   . Dysthymic   . GERD (gastroesophageal reflux disease)   . Hypertension   . Leg edema    Asymetric Right leg (dopplers negative)  . Overweight (BMI 25.0-29.9)   . Stroke (HCC)   . Urination frequency    Vitals:   03/04/18 1015  BP: (!) 153/82  Pulse: 75  Temp: 98.2 F (36.8 C)  TempSrc: Oral  SpO2: 98%  Weight: 188 lb (85.3 kg)      Review of Systems  Respiratory: Negative.   Cardiovascular: Negative.   Gastrointestinal: Negative.   Musculoskeletal:       Bilateral leg pain  Neurological: Negative.  Negative for tingling and numbness.  All other systems reviewed and are negative.      Objective:   Physical Exam  Constitutional: She appears well-developed. No distress.  Cardiovascular: Normal rate, regular rhythm and normal heart sounds.  No murmur heard. Pulmonary/Chest: Effort normal and breath sounds normal. No stridor. No respiratory distress. She has no wheezes.  Abdominal: Soft. Bowel sounds are normal. She exhibits no distension and no mass. There is no tenderness.  Musculoskeletal: She exhibits edema.  ++ pitting edema right. No calf tenderness or swelling.  Neurological: No sensory deficit.  Nursing note and vitals reviewed.      Assessment:     Calf cramping HTN Edema    Plan:     Check problem list.

## 2018-03-04 NOTE — Patient Instructions (Signed)
Cramping  Muscle Cramps and Spasms Muscle cramps and spasms are when muscles tighten by themselves. They usually get better within minutes. Muscle cramps are painful. They are usually stronger and last longer than muscle spasms. Muscle spasms may or may not be painful. They can last a few seconds or much longer. Follow these instructions at home:  Drink enough fluid to keep your pee (urine) clear or pale yellow.  Massage, stretch, and relax the muscle.  If directed, apply heat to tight or tense muscles as often as told by your doctor. Use the heat source that your doctor recommends. ? Place a towel between your skin and the heat source. ? Leave the heat on for 20-30 minutes. ? Take off the heat if your skin turns bright red. This is especially important if you are unable to feel pain, heat, or cold. You may have a greater risk of getting burned.  If directed, put ice on the affected area. This may help if you are sore or have pain after a cramp or spasm. ? Put ice in a plastic bag. ? Place a towel between your skin and the bag. ? Leave the ice on for 20 minutes, 2-3 times a day.  Take over-the-counter and prescription medicines only as told by your doctor.  Pay attention to any changes in your symptoms. Contact a doctor if:  Your cramps or spasms get worse or happen more often.  Your cramps or spasms do not get better with time. This information is not intended to replace advice given to you by your health care provider. Make sure you discuss any questions you have with your health care provider. Document Released: 06/28/2008 Document Revised: 08/17/2015 Document Reviewed: 04/19/2015 Elsevier Interactive Patient Education  2018 [employer]

## 2018-03-04 NOTE — Assessment & Plan Note (Signed)
BP elevated like because she did not take her meds today yet. She will take meds as soon as she gets home. F/U with PCP soon for reassessment.

## 2018-03-05 LAB — BASIC METABOLIC PANEL
BUN / CREAT RATIO: 14 (ref 12–28)
BUN: 13 mg/dL (ref 8–27)
CALCIUM: 9.1 mg/dL (ref 8.7–10.3)
CO2: 24 mmol/L (ref 20–29)
Chloride: 104 mmol/L (ref 96–106)
Creatinine, Ser: 0.91 mg/dL (ref 0.57–1.00)
GFR, EST AFRICAN AMERICAN: 78 mL/min/{1.73_m2} (ref >59)
GFR, EST NON AFRICAN AMERICAN: 68 mL/min/{1.73_m2} (ref >59)
Glucose: 88 mg/dL (ref 65–99)
Potassium: 3.8 mmol/L (ref 3.5–5.2)
Sodium: 143 mmol/L (ref 134–144)

## 2018-03-05 LAB — MAGNESIUM: MAGNESIUM: 1.9 mg/dL (ref 1.6–2.3)

## 2018-03-18 ENCOUNTER — Observation Stay (HOSPITAL_COMMUNITY)
Admission: EM | Admit: 2018-03-18 | Discharge: 2018-03-19 | Disposition: A | Discharge status: Home / Self Care | Payer: Medicaid Other | Attending: Family Medicine | Admitting: Family Medicine

## 2018-03-18 ENCOUNTER — Emergency Department (HOSPITAL_COMMUNITY): Payer: Medicaid Other

## 2018-03-18 ENCOUNTER — Encounter (HOSPITAL_COMMUNITY): Payer: Self-pay

## 2018-03-18 ENCOUNTER — Emergency Department (HOSPITAL_BASED_OUTPATIENT_CLINIC_OR_DEPARTMENT_OTHER): Payer: Medicaid Other

## 2018-03-18 ENCOUNTER — Other Ambulatory Visit: Payer: Self-pay

## 2018-03-18 DIAGNOSIS — K219 Gastro-esophageal reflux disease without esophagitis: Secondary | ICD-10-CM | POA: Diagnosis not present

## 2018-03-18 DIAGNOSIS — D649 Anemia, unspecified: Secondary | ICD-10-CM | POA: Diagnosis not present

## 2018-03-18 DIAGNOSIS — M21372 Foot drop, left foot: Secondary | ICD-10-CM | POA: Insufficient documentation

## 2018-03-18 DIAGNOSIS — I110 Hypertensive heart disease with heart failure: Secondary | ICD-10-CM | POA: Diagnosis not present

## 2018-03-18 DIAGNOSIS — Z7982 Long term (current) use of aspirin: Secondary | ICD-10-CM | POA: Insufficient documentation

## 2018-03-18 DIAGNOSIS — Z8249 Family history of ischemic heart disease and other diseases of the circulatory system: Secondary | ICD-10-CM | POA: Diagnosis not present

## 2018-03-18 DIAGNOSIS — E860 Dehydration: Secondary | ICD-10-CM | POA: Insufficient documentation

## 2018-03-18 DIAGNOSIS — R609 Edema, unspecified: Secondary | ICD-10-CM | POA: Diagnosis not present

## 2018-03-18 DIAGNOSIS — D72829 Elevated white blood cell count, unspecified: Secondary | ICD-10-CM | POA: Diagnosis not present

## 2018-03-18 DIAGNOSIS — R059 Cough, unspecified: Secondary | ICD-10-CM

## 2018-03-18 DIAGNOSIS — M79604 Pain in right leg: Secondary | ICD-10-CM

## 2018-03-18 DIAGNOSIS — M79605 Pain in left leg: Secondary | ICD-10-CM

## 2018-03-18 DIAGNOSIS — E876 Hypokalemia: Secondary | ICD-10-CM

## 2018-03-18 DIAGNOSIS — R05 Cough: Secondary | ICD-10-CM

## 2018-03-18 DIAGNOSIS — G8929 Other chronic pain: Secondary | ICD-10-CM | POA: Insufficient documentation

## 2018-03-18 DIAGNOSIS — R0602 Shortness of breath: Secondary | ICD-10-CM

## 2018-03-18 DIAGNOSIS — I5032 Chronic diastolic (congestive) heart failure: Secondary | ICD-10-CM | POA: Diagnosis not present

## 2018-03-18 DIAGNOSIS — Z8673 Personal history of transient ischemic attack (TIA), and cerebral infarction without residual deficits: Secondary | ICD-10-CM | POA: Insufficient documentation

## 2018-03-18 DIAGNOSIS — F341 Dysthymic disorder: Secondary | ICD-10-CM | POA: Insufficient documentation

## 2018-03-18 DIAGNOSIS — Z885 Allergy status to narcotic agent status: Secondary | ICD-10-CM | POA: Diagnosis not present

## 2018-03-18 DIAGNOSIS — R55 Syncope and collapse: Principal | ICD-10-CM | POA: Insufficient documentation

## 2018-03-18 DIAGNOSIS — R42 Dizziness and giddiness: Secondary | ICD-10-CM

## 2018-03-18 LAB — BASIC METABOLIC PANEL
Anion gap: 11 (ref 5–15)
BUN: 19 mg/dL (ref 8–23)
CHLORIDE: 104 mmol/L (ref 98–111)
CO2: 23 mmol/L (ref 22–32)
CREATININE: 0.85 mg/dL (ref 0.44–1.00)
Calcium: 9.2 mg/dL (ref 8.9–10.3)
GFR calc Af Amer: >60 mL/min (ref >60)
GFR calc non Af Amer: >60 mL/min (ref >60)
Glucose, Bld: 118 mg/dL — ABNORMAL HIGH (ref 70–99)
Potassium: 3.1 mmol/L — ABNORMAL LOW (ref 3.5–5.1)
SODIUM: 138 mmol/L (ref 135–145)

## 2018-03-18 LAB — CBC
HEMATOCRIT: 35.8 % — AB (ref 36.0–46.0)
Hemoglobin: 11.3 g/dL — ABNORMAL LOW (ref 12.0–15.0)
MCH: 27.6 pg (ref 26.0–34.0)
MCHC: 31.6 g/dL (ref 30.0–36.0)
MCV: 87.5 fL (ref 78.0–100.0)
PLATELETS: 306 10*3/uL (ref 150–400)
RBC: 4.09 MIL/uL (ref 3.87–5.11)
RDW: 13.2 % (ref 11.5–15.5)
WBC: 19.7 10*3/uL — ABNORMAL HIGH (ref 4.0–10.5)

## 2018-03-18 LAB — URINALYSIS, ROUTINE W REFLEX MICROSCOPIC
Bilirubin Urine: NEGATIVE
GLUCOSE, UA: NEGATIVE mg/dL
Hgb urine dipstick: NEGATIVE
KETONES UR: NEGATIVE mg/dL
Leukocytes, UA: NEGATIVE
Nitrite: NEGATIVE
PH: 5 (ref 5.0–8.0)
PROTEIN: NEGATIVE mg/dL
Specific Gravity, Urine: 1.025 (ref 1.005–1.030)

## 2018-03-18 LAB — I-STAT TROPONIN, ED: Troponin i, poc: 0 ng/mL (ref 0.00–0.08)

## 2018-03-18 LAB — BRAIN NATRIURETIC PEPTIDE: B Natriuretic Peptide: 32.4 pg/mL (ref 0.0–100.0)

## 2018-03-18 LAB — D-DIMER, QUANTITATIVE: D-Dimer, Quant: 0.33 ug/mL-FEU (ref 0.00–0.50)

## 2018-03-18 MED ORDER — SODIUM CHLORIDE 0.9 % IV SOLN
Freq: Once | INTRAVENOUS | Status: AC
  Administered 2018-03-18: 18:00:00 via INTRAVENOUS

## 2018-03-18 MED ORDER — CARVEDILOL 6.25 MG PO TABS
6.2500 mg | ORAL_TABLET | Freq: Two times a day (BID) | ORAL | Status: DC
  Administered 2018-03-19 (×2): 6.25 mg via ORAL
  Filled 2018-03-18 (×2): qty 1

## 2018-03-18 MED ORDER — POLYETHYLENE GLYCOL 3350 17 GM/SCOOP PO POWD
17.0000 g | Freq: Every day | ORAL | Status: DC | PRN
  Filled 2018-03-18: qty 255

## 2018-03-18 MED ORDER — ATORVASTATIN CALCIUM 40 MG PO TABS
40.0000 mg | ORAL_TABLET | Freq: Every day | ORAL | Status: DC
  Administered 2018-03-19: 40 mg via ORAL
  Filled 2018-03-18: qty 1

## 2018-03-18 MED ORDER — ASPIRIN EC 81 MG PO TBEC
81.0000 mg | DELAYED_RELEASE_TABLET | Freq: Every day | ORAL | Status: DC
  Administered 2018-03-19: 81 mg via ORAL
  Filled 2018-03-18: qty 1

## 2018-03-18 MED ORDER — POTASSIUM CHLORIDE CRYS ER 20 MEQ PO TBCR
40.0000 meq | EXTENDED_RELEASE_TABLET | Freq: Once | ORAL | Status: AC
  Administered 2018-03-18: 40 meq via ORAL
  Filled 2018-03-18: qty 2

## 2018-03-18 MED ORDER — ENOXAPARIN SODIUM 40 MG/0.4ML ~~LOC~~ SOLN
40.0000 mg | SUBCUTANEOUS | Status: DC
  Administered 2018-03-19: 40 mg via SUBCUTANEOUS
  Filled 2018-03-18 (×2): qty 0.4

## 2018-03-18 MED ORDER — SODIUM CHLORIDE 0.9 % IV SOLN
INTRAVENOUS | Status: AC
  Administered 2018-03-18 – 2018-03-19 (×2): via INTRAVENOUS

## 2018-03-18 MED ORDER — POLYETHYLENE GLYCOL 3350 17 G PO PACK: 17.0000 g | PACK | Freq: Every day | ORAL | Status: DC | PRN

## 2018-03-18 MED ORDER — LISINOPRIL 20 MG PO TABS
20.0000 mg | ORAL_TABLET | Freq: Every day | ORAL | Status: DC
  Administered 2018-03-19: 20 mg via ORAL
  Filled 2018-03-18: qty 1

## 2018-03-18 NOTE — ED Notes (Signed)
ED Provider at bedside. 

## 2018-03-18 NOTE — Progress Notes (Signed)
Bilateral lower extremity venous duplex completed. Preliminary results - There is no evidence of a DVT or Baker's cyst. Toma DeitersVirginia Coleta Grosshans, RVS 03/18/2018 5:43 PM
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
## 2018-03-18 NOTE — ED Triage Notes (Signed)
Per [facility], pt was walking to her car when she had sudden dizziness, vomiting x1 and a syncopal episode. Pt denies any current dizziness or nausea. Pt hx of stroke. Pt denies blood thinner use. Pt denies pain. Pt alert and oriented x4.

## 2018-03-18 NOTE — ED Notes (Signed)
Pharmacy messaged about unverified meds 

## 2018-03-18 NOTE — ED Notes (Signed)
Admitting team at bedside.

## 2018-03-19 ENCOUNTER — Encounter (HOSPITAL_COMMUNITY): Payer: Self-pay | Admitting: General Practice

## 2018-03-19 ENCOUNTER — Observation Stay (HOSPITAL_BASED_OUTPATIENT_CLINIC_OR_DEPARTMENT_OTHER): Payer: Medicaid Other

## 2018-03-19 ENCOUNTER — Other Ambulatory Visit: Payer: Self-pay

## 2018-03-19 DIAGNOSIS — R55 Syncope and collapse: Secondary | ICD-10-CM

## 2018-03-19 DIAGNOSIS — I10 Essential (primary) hypertension: Secondary | ICD-10-CM | POA: Diagnosis not present

## 2018-03-19 DIAGNOSIS — R42 Dizziness and giddiness: Secondary | ICD-10-CM | POA: Diagnosis not present

## 2018-03-19 DIAGNOSIS — R05 Cough: Secondary | ICD-10-CM

## 2018-03-19 DIAGNOSIS — E876 Hypokalemia: Secondary | ICD-10-CM

## 2018-03-19 LAB — CBC
HCT: 34.3 % — ABNORMAL LOW (ref 36.0–46.0)
HEMOGLOBIN: 10.7 g/dL — AB (ref 12.0–15.0)
MCH: 27.4 pg (ref 26.0–34.0)
MCHC: 31.2 g/dL (ref 30.0–36.0)
MCV: 87.7 fL (ref 78.0–100.0)
Platelets: 291 10*3/uL (ref 150–400)
RBC: 3.91 MIL/uL (ref 3.87–5.11)
RDW: 13.6 % (ref 11.5–15.5)
WBC: 15.8 10*3/uL — AB (ref 4.0–10.5)

## 2018-03-19 LAB — ECHOCARDIOGRAM COMPLETE
HEIGHTINCHES: 61 in
WEIGHTICAEL: 2941.82 [oz_av]

## 2018-03-19 LAB — COMPREHENSIVE METABOLIC PANEL
ALT: 15 U/L (ref 0–44)
AST: 17 U/L (ref 15–41)
Albumin: 3 g/dL — ABNORMAL LOW (ref 3.5–5.0)
Alkaline Phosphatase: 74 U/L (ref 38–126)
Anion gap: 6 (ref 5–15)
BUN: 19 mg/dL (ref 8–23)
CHLORIDE: 108 mmol/L (ref 98–111)
CO2: 26 mmol/L (ref 22–32)
Calcium: 8.6 mg/dL — ABNORMAL LOW (ref 8.9–10.3)
Creatinine, Ser: 0.91 mg/dL (ref 0.44–1.00)
Glucose, Bld: 102 mg/dL — ABNORMAL HIGH (ref 70–99)
POTASSIUM: 3.6 mmol/L (ref 3.5–5.1)
Sodium: 140 mmol/L (ref 135–145)
TOTAL PROTEIN: 6.8 g/dL (ref 6.5–8.1)
Total Bilirubin: 0.5 mg/dL (ref 0.3–1.2)

## 2018-03-19 LAB — TSH: TSH: 3.176 u[IU]/mL (ref 0.350–4.500)

## 2018-03-19 LAB — HIV ANTIBODY (ROUTINE TESTING W REFLEX): HIV SCREEN 4TH GENERATION: NONREACTIVE

## 2018-03-19 LAB — MAGNESIUM: MAGNESIUM: 2 mg/dL (ref 1.7–2.4)

## 2018-03-19 MED ORDER — SPIRONOLACTONE 25 MG PO TABS: 25.0000 mg | ORAL_TABLET | Freq: Every day | ORAL | Status: DC

## 2018-03-19 MED ORDER — POTASSIUM CHLORIDE CRYS ER 20 MEQ PO TBCR: 40.0000 meq | EXTENDED_RELEASE_TABLET | Freq: Once | ORAL | Status: DC

## 2018-03-19 MED ORDER — SPIRONOLACTONE 25 MG PO TABS: 25.0000 mg | ORAL_TABLET | Freq: Every day | ORAL | 0 refills | Status: DC

## 2018-03-19 MED ORDER — CARVEDILOL 6.25 MG PO TABS: 6.2500 mg | ORAL_TABLET | Freq: Two times a day (BID) | ORAL | 0 refills | Status: DC

## 2018-03-19 NOTE — Progress Notes (Signed)
Pt D/C to home. Remove pt IV and Tele monitor. Went over pt medication and schedule appt. Pt stated understand and will follow up. Pt pick up by husband with private transportation. Explained to pt about new medication and side effect and when to contact MD if those S/E occur. Pt stated understand and repeated back to me.

## 2018-03-19 NOTE — Progress Notes (Signed)
  Echocardiogram 2D Echocardiogram has been performed.  Megan Miller, [name] 03/19/2018, 8:44 AM

## 2018-03-19 NOTE — Evaluation (Signed)
Physical Therapy Evaluation Patient Details Name: Megan Miller MRN: [id number] DOB: Mar 17, 1956 Today's Date: 03/19/2018   History of Present Illness  Pt is a 62 y.o. 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 Admitted following a syncopal episode. MRI showed old right basal ganglia, bilateral thalamus, and bilateral cerebellar small infarcts.  Clinical Impression  Pt admitted with above diagnosis. During PT evaluation, patient denied dizziness and orthostatics stable (see below). In regards to functional mobility, patient seems to be at baseline. Currently ambulating in room with no difficulty. No further acute PT needs and patient has no further questions. PT signing off.  Orthostatics: Supine: 111/53 Sitting: 117/60 Standing: 126/59    Follow Up Recommendations No PT follow up    Equipment Recommendations  None recommended by PT    Recommendations for Other Services       Precautions / Restrictions Precautions Precautions: None Restrictions Weight Bearing Restrictions: No      Mobility  Bed Mobility Overal bed mobility: Independent                Transfers Overall transfer level: Independent                  Ambulation/Gait Ambulation/Gait assistance: Modified independent (Device/Increase time) Gait Distance (Feet): 20 Feet Assistive device: None Gait Pattern/deviations: Step-through pattern;Wide base of support     General Gait Details: Patient agreeable to ambulate in room only. No overt LOB with no assistive device utilized  Information systems manager Rankin (Stroke Patients Only) Modified Rankin (Stroke Patients Only) Pre-Morbid Rankin Score: No significant disability Modified Rankin: No significant disability     Balance Overall balance assessment: Mild deficits observed, not formally tested                                            Pertinent Vitals/Pain Pain Assessment: No/denies pain    Home Living Family/patient expects to be discharged to:: Private residence Living Arrangements: Alone Available Help at Discharge: Friend(s) Type of Home: House Home Access: Stairs to enter Entrance Stairs-Rails: Can reach both Entrance Stairs-Number of Steps: 2 Home Layout: One level Home Equipment: Cane - single point      Prior Function Level of Independence: Independent with assistive device(s)         Comments: uses cane intermittently. Independent with ADL's     Hand Dominance        Extremity/Trunk Assessment   Upper Extremity Assessment Upper Extremity Assessment: RUE deficits/detail;LUE deficits/detail RUE Deficits / Details: 5/5 LUE Deficits / Details: 5/5    Lower Extremity Assessment Lower Extremity Assessment: RLE deficits/detail;LLE deficits/detail RLE Deficits / Details: 5/5 LLE Deficits / Details: 5/5       Communication   Communication: No difficulties  Cognition Arousal/Alertness: Awake/alert Behavior During Therapy: Flat affect Overall Cognitive Status: Within Functional Limits for tasks assessed                                        General Comments      Exercises     Assessment/Plan    PT Assessment Patent does not need any further PT services  PT Problem List         PT Treatment Interventions      PT Goals (Current goals can be found in the Care Plan section)  Acute Rehab PT Goals Patient Stated Goal: "get out of here." PT Goal Formulation: All assessment and education complete, DC therapy    Frequency     Barriers to discharge        Co-evaluation               AM-PAC PT "6 Clicks" Daily Activity  Outcome Measure Difficulty turning over in bed (including adjusting bedclothes, sheets and blankets)?: None Difficulty moving from lying on back to sitting on the side of the bed? :  None Difficulty sitting down on and standing up from a chair with arms (e.g., wheelchair, bedside commode, etc,.)?: None Help needed moving to and from a bed to chair (including a wheelchair)?: None Help needed walking in hospital room?: None Help needed climbing 3-5 steps with a railing? : A Little 6 Click Score: 23    End of Session   Activity Tolerance: Patient tolerated treatment well Patient left: in bed;with call bell/phone within reach   PT Visit Diagnosis: Difficulty in walking, not elsewhere classified (R26.2);Dizziness and giddiness (R42)    Time: 8119-1478 PT Time Calculation (min) (ACUTE ONLY): 22 min   Charges:   PT Evaluation $PT Eval Low Complexity: 1 Low         Laurina Bustle, PT, DPT Acute Rehabilitation Services  Pager: [phone]  Vanetta Mulders 03/19/2018, 10:13 AM

## 2018-03-19 NOTE — Discharge Instructions (Signed)
You were admitted for episode of passing out. Cardiology was consulted and there was no cardiac cause identified. It was thought that your fainting episode was related to lower blood pressure and not eating as much. It is important to eat regular meals three times per day. You should follow up on Friday 8/23 for a blood pressure check, please call the clinic for a nurse visit. You should also follow up on 8/27 for a hospital follow up. If you are feeling dizzy, lightheaded, or having vision changes, you should check your blood pressure and call the doctor. If you start having chest pain, go to the ED.

## 2018-03-19 NOTE — Evaluation (Signed)
Occupational Therapy Evaluation and Discharge Patient Details Name: Megan Miller MRN: [id number] DOB: May 19, 1956 Today's Date: 03/19/2018    History of Present Illness Pt is a 62 y.o. 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 Admitted following a syncopal episode. MRI showed old right basal ganglia, bilateral thalamus, and bilateral cerebellar small infarcts.   Clinical Impression   PTA, pt was independent with ADL and functional mobility. She currently is able to complete ADL and ADL transfers with overall independence this session. No noted deficits in strength, coordination, vision, and sensation this session. Educated concerning safety precautions and activity progression post-acute D/C. No further acute OT needs identified and all OT education complete at this time. OT will sign off.     Follow Up Recommendations  No OT follow up;Supervision - Intermittent    Equipment Recommendations  None recommended by OT    Recommendations for Other Services       Precautions / Restrictions Precautions Precautions: None Restrictions Weight Bearing Restrictions: No      Mobility Bed Mobility Overal bed mobility: Independent                Transfers Overall transfer level: Independent                    Balance Overall balance assessment: Mild deficits observed, not formally tested                                         ADL either performed or assessed with clinical judgement   ADL Overall ADL's : Independent                                       General ADL Comments: Able to complete ADL and ADL transfers independently at this time. Educated concerning safety precautions.      Vision Patient Visual Report: No change from baseline Vision Assessment?: No apparent visual deficits     Perception     Praxis      Pertinent  Vitals/Pain Pain Assessment: No/denies pain     Hand Dominance     Extremity/Trunk Assessment Upper Extremity Assessment Upper Extremity Assessment: Overall WFL for tasks assessed(coordination and sensation in tact)   Lower Extremity Assessment Lower Extremity Assessment: Overall WFL for tasks assessed       Communication Communication Communication: No difficulties   Cognition Arousal/Alertness: Awake/alert Behavior During Therapy: Flat affect Overall Cognitive Status: Within Functional Limits for tasks assessed                                     General Comments       Exercises     Shoulder Instructions      Home Living Family/patient expects to be discharged to:: Private residence Living Arrangements: Alone Available Help at Discharge: Friend(s) Type of Home: House Home Access: Stairs to enter [employer] of Steps: 2 Entrance Stairs-Rails: Can reach both Home Layout: One level     Bathroom Shower/Tub: Chief Strategy Officer: Standard     Home Equipment: Cane - single point          Prior Functioning/Environment Level  of Independence: Independent with assistive device(s)        Comments: uses cane intermittently. Independent with ADL's        OT Problem List: Impaired balance (sitting and/or standing)      OT Treatment/Interventions:      OT Goals(Current goals can be found in the care plan section) Acute Rehab OT Goals Patient Stated Goal: "get out of here." OT Goal Formulation: With patient  OT Frequency:     Barriers to D/C:            Co-evaluation              AM-PAC PT "6 Clicks" Daily Activity     Outcome Measure Help from another person eating meals?: None Help from another person taking care of personal grooming?: None Help from another person toileting, which includes using toliet, bedpan, or urinal?: None Help from another person bathing (including washing, rinsing, drying)?:  None Help from another person to put on and taking off regular upper body clothing?: None Help from another person to put on and taking off regular lower body clothing?: None 6 Click Score: 24   End of Session Nurse Communication: Mobility status  Activity Tolerance: Patient tolerated treatment well Patient left: in bed;with call bell/phone within reach  OT Visit Diagnosis: Other abnormalities of gait and mobility (R26.89)                Time: 1020-1030 OT Time Calculation (min): 10 min Charges:  OT General Charges $OT Visit: 1 Visit OT Evaluation $OT Eval Low Complexity: 1 Low  Doristine Section, MS OTR/L  Pager: [phone]   [name] 03/19/2018, 2:07 PM

## 2018-03-19 NOTE — Discharge Summary (Signed)
Family Medicine Teaching [facility] Discharge Summary  Patient name: Megan Miller Medical record number: [id number] Date of birth: [birth year] Age: 62 y.o. Gender: female Date of Admission: 03/18/2018 Date of Discharge: 8/21 Admitting Physician: Doreene Eland, MD  Primary Care Provider: Mirian Mo, MD Consultations: cardiology  Indication for Hospitalization: syncopal episode  Discharge Diagnoses/Problem List:  Syncope CVA Dysthymic disorder HTN GERD Chronic constipation Depression Stable angina  Disposition: discharge to home  Discharge Condition: stable  Discharge Exam:  see progress note from date of discharge  Brief Hospital Course:  Patient presented 8/20 after syncopal episode after being in [place] and car that induced coughing and post tussive emesis with a short syncopal episode witnessed by boyfriend. CT head showed no acute intracranial abnormality. Chest xray showed stable mild cardiomegaly without overt pulmonary edema. TTE was normal with eEF 60-65%. EKG without diffuse ST elevations or signs of pericarditis. D dimer neg, troponins neg. Vital signs remained stable on admission. Cardiology was consulted- workup concluded that this syncopal episode similar to previous episodes work ups was caused by a vaso-vagal response exaggerated by anemia, hypokalemia, dehydration/low oral intake for the day and leukocytosis. Patient had hypokalemia 3.1 which was replenished to a goal of 4.0. Cardiology recommended discontinuing lisinopril, HCTZ, and potassium supplement and instead starting on Spironolactone 25mg  daily. She will need a close follow up to recheck her blood pressure and potassium level to assess for the need for additional supplement. Continue carvedilol at current dose.  Issues for Follow Up:  1. Follow up with PCP  1. For blood pressure follow up since started on new meds 2. For potassium level (BMP)  Significant Procedures: echo  Significant  Labs and Imaging:  Recent Labs  Lab 03/18/18 1606 03/19/18 0425  WBC 19.7* 15.8*  HGB 11.3* 10.7*  HCT 35.8* 34.3*  PLT 306 291   Recent Labs  Lab 03/18/18 1606 03/19/18 0425  NA 138 140  K 3.1* 3.6  CL 104 108  CO2 23 26  GLUCOSE 118* 102*  BUN 19 19  CREATININE 0.85 0.91  CALCIUM 9.2 8.6*  MG  --  2.0  ALKPHOS  --  74  AST  --  17  ALT  --  15  ALBUMIN  --  3.0*   Results/Tests Pending at Time of Discharge: none  Discharge Medications:  Allergies as of 03/19/2018      Reactions   Percocet [oxycodone-acetaminophen] Nausea And Vomiting   Codeine Phosphate Itching      Medication List    STOP taking these medications   ACIPHEX 20 MG tablet Generic drug:  RABEprazole   cetirizine 10 MG tablet Commonly known as:  ZYRTEC   ibuprofen 800 MG tablet Commonly known as:  ADVIL,MOTRIN   lisinopril-hydrochlorothiazide 20-12.5 MG tablet Commonly known as:  PRINZIDE,ZESTORETIC   meloxicam 7.5 MG tablet Commonly known as:  MOBIC   omeprazole 40 MG capsule Commonly known as:  PRILOSEC   ondansetron 4 MG tablet Commonly known as:  ZOFRAN   Potassium Chloride ER 20 MEQ Tbcr   sertraline 50 MG tablet Commonly known as:  ZOLOFT   simethicone 80 MG chewable tablet Commonly known as:  MYLICON   topiramate 25 MG tablet Commonly known as:  TOPAMAX   triamcinolone ointment 0.1 % Commonly known as:  KENALOG   Zoster Vaccine Live (PF) 19400 UNT/0.65ML injection Commonly known as:  ZOSTAVAX     TAKE these medications   aspirin 81 MG tablet Take 1 tablet (81 mg  total) by mouth daily.   atorvastatin 40 MG tablet Commonly known as:  LIPITOR Take 1 tablet (40 mg total) by mouth daily.   carvedilol 6.25 MG tablet Commonly known as:  COREG Take 1 tablet (6.25 mg total) by mouth 2 (two) times daily with a meal. Start taking on:  03/20/2018 What changed:    medication strength  how much to take  when to take this   desonide 0.05 % cream Commonly known  as:  DESOWEN Apply topically 2 (two) times daily. What changed:    how much to take  when to take this  additional instructions   ipratropium 0.06 % nasal spray Commonly known as:  ATROVENT Place 2 sprays into both nostrils 4 (four) times daily. What changed:    when to take this  reasons to take this   minoxidil 2 % external solution Commonly known as:  ROGAINE Apply topically 2 (two) times daily. What changed:    how much to take  when to take this  reasons to take this   nitroGLYCERIN 0.2 mg/hr patch Commonly known as:  NITRODUR - Dosed in mg/24 hr APPLY 1/4 PATCH TO AFFECTED AREA. CHANGE PATCH EVERY 24 HOURS. What changed:  See the new instructions.   nystatin cream Commonly known as:  MYCOSTATIN Apply 1 application topically 2 (two) times daily.   olopatadine 0.1 % ophthalmic solution Commonly known as:  PATANOL Place 1 drop into both eyes 2 (two) times daily as needed (seasonal allergies/itching).   polyethylene glycol powder powder Commonly known as:  GLYCOLAX/MIRALAX Dissolve 17 grams in at least 8 ounces water/juice and drink daily What changed:    how much to take  how to take this  when to take this  reasons to take this  additional instructions   spironolactone 25 MG tablet Commonly known as:  ALDACTONE Take 1 tablet (25 mg total) by mouth daily. Start taking on:  03/20/2018       Discharge Instructions: Please refer to Patient Instructions section of EMR for full details.  Patient was counseled important signs and symptoms that should prompt return to medical care, changes in medications, dietary instructions, activity restrictions, and follow up appointments.   Follow-Up Appointments:  8/22 at [facility] medicine clinic for nursing apt  8/27- hospital follow up 9/10[facility] for hand concerns  Leeroy Bock, DO 03/19/2018, 7:04 PM PGY-1, [facility]

## 2018-03-19 NOTE — Consult Note (Addendum)
Cardiology Consult    Patient ID: Megan Miller MRN: [id number], DOB/AGE: 1955-10-16   Admit date: 03/18/2018 Date of Consult: 03/19/2018  Primary Physician: Mirian Mo, MD Primary Cardiologist: No primary care provider on file. Requesting Provider: Leary Roca, PA-C  Patient Profile    Megan Miller is a 62 y.o. female known to [facility] with a history HTN, stable angina, syncopal episodes (TTE 02/2018 EF 60-65%, trivial pericardial effusion, G1DD & Holter 05/2017 unremarkable / NSR), migraine and lumbosacral radiculopathy who is being seen today for the evaluation of syncope at the request of Leary Roca, PA-C.   Past Medical History   Past Medical History:  Diagnosis Date  . Allergic conjunctivitis   . Chest pain   . Chronic back pain   . Constipation, chronic   . Dyspnea on exertion   . Dysthymic   . GERD (gastroesophageal reflux disease)   . Hypertension   . Leg edema    Asymetric Right leg (dopplers negative)  . Overweight (BMI 25.0-29.9)   . Stroke (HCC)   . Urination frequency     Past Surgical History:  Procedure Laterality Date  . 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 BILATERAL SALPINGOOPHORECTOMY  05/08/2001   Path=extensive endometriosis  . BREAST SURGERY  12/29/2003   Right breast core biopsy=benign fibrocystic change  . SMALL INTESTINE SURGERY  03/30/2001   Rectosigmoid bowel resection and reanastomosis     Allergies  Allergies  Allergen Reactions  . Percocet [Oxycodone-Acetaminophen] Nausea And Vomiting  . Codeine Phosphate Itching    History of Present Illness    62 yo female with PMH as above and cardiac imaging and studies as below.   On 05/31/2017, patient seen by Nada Boozer, NP of [facility] for 3 episodes of syncope and documented as a poor historian at that time. Per documentation, patient reported first 2017 syncopal episode while sitting and associated with emesis and mild HA. Per ED  documentation, patient hypokalemic & head CT without acute abnormality. EKG without acute changes. 03/2017 second syncopal episode reported as occurring while driving and again associated emesis with 5-10 minutes unconscious and after which patient did not report to ED. 3rd patient reported episode occurred at the haird[employer] and lasted only seconds. Per 05/2017 documentation, recommendation was for 30 day event monitor and echo. Event monitor results and echo below with EF 60-65%, G1DD, trivial pericardial effusion & event monitor showing NSR. Recommendation for no driving for 6 months and neuro evaluation.  On 08/08/2017: Seen by Jake Bathe, MD for exertional and central chest pain / pressure and DOE alleviated by 1 minute of rest, intermittent symptoms of pre-syncope, syncope, and diaphoresis. EKG at that time showed TWI in V1 and V2 stable from prior EKG. At that time, documented differential included vasovagal syncope with episodes mainly occurring in the summertime.   Per patient report, she left church 8/21 and became warm, dizzy and then nauseas. She reports a long coughing fit that resulted in regurgitated breakfast cereal followed by syncope. She denies SOB, numbness, tingling, chest pain, jaw pain, back pain, arm pain, abdominal pain, weakness, vision changes, or headache leading up to the event. She denies diarrhea, changes in bowel movements, or changes in urination leading up to the event. She does not report any sick contacts. She does report leg pain a month ago, as well as chronic left lower leg edema. Following the syncopal episode, she states she was unresponsive for 5 minutes but denies confusion upon  waking.   In the ED, documentation includes patient ambulated without difficulty. Vitals 123/57, HR 67, T 98.5, RR 16, SpO2 99%. Labs significant for hypokalemia with K 3.1, glucose 118, WBC 19.7,Hgb 11.3, HCT 35.8, and UA noted as cloudy. EKG read in the ED as NSR, 71 bpm, and no significant  change since previous. CXR with stable mild cardiomegaly & no pulmonary edema or disease. CT without contrast read as no acute intracranial abnormality, stable mild chronic small vessel disease.    On exam today 8/21, patient is somnolent and a poor historian. She reports she is currently asymptomatic and without nausea, HA, chest pain, SOB, pre-syncope sx, or syncopal episode since admission.  She reports resolution of nausea and emesis with today's earlier syncopal episode. Of note, patient underwent lower extremity US found negative for DVT and CT negative for acute stroke.   Inpatient Medications    . aspirin EC  81 mg Oral Daily  . atorvastatin  40 mg Oral Daily  . carvedilol  6.25 mg Oral BID WC  . enoxaparin (LOVENOX) injection  40 mg Subcutaneous Q24H  . lisinopril  20 mg Oral Daily    Family History    Family History  Problem Relation Age of Onset  . Hypertension Mother   . Diabetes Father   . Diabetes Sister   . Asthma Brother   . Diabetes Brother   . Kidney disease Brother   . Colon cancer Neg Hx   . Colon polyps Neg Hx   . Esophageal cancer Neg Hx   . Breast cancer Neg Hx    She indicated that her mother is deceased. She indicated that her father is deceased. She indicated that the status of her sister is unknown. She indicated that the status of her neg hx is unknown.   Social History    Social History   Socioeconomic History  . Marital status: Divorced    Spouse name: Not on file  . Number of children: 0  . Years of education: Not on file  . Highest education level: Not on file  Occupational History  . Occupation: Disability  Social Needs  . Financial resource strain: Not on file  . Food insecurity:    Worry: Not on file    Inability: Not on file  . Transportation needs:    Medical: Not on file    Non-medical: Not on file  Tobacco Use  . Smoking status: Never Smoker  . Smokeless tobacco: Never Used  Substance and Sexual Activity  . Alcohol use: No      Alcohol/week: 0.0 standard drinks  . Drug use: No  . Sexual activity: Never    Birth control/protection: Abstinence  Lifestyle  . Physical activity:    Days per week: Not on file    Minutes per session: Not on file  . Stress: Not on file  Relationships  . Social connections:    Talks on phone: Not on file    Gets together: Not on file    Attends religious service: Not on file    Active member of club or organization: Not on file    Attends meetings of clubs or organizations: Not on file    Relationship status: Not on file  . Intimate partner violence:    Fear of current or ex partner: Not on file    Emotionally abused: Not on file    Physically abused: Not on file    Forced sexual activity: Not on file  Other Topics  Concern  . Not on file  Social History Narrative  . Not on file     Review of Systems    General:  No chills, fever, night sweats or weight changes.  Cardiovascular:  No chest pain, dyspnea on exertion, ++ lower extremity edema L>R, no orthopnea, palpitations, paroxysmal nocturnal dyspnea. Dermatological: No rash, lesions/masses Respiratory: +++ cough, no dyspnea Urologic: No hematuria, dysuria Abdominal: +++ nausea, +++ vomiting, no diarrhea, bright red blood per rectum, melena, or hematemesis Neurologic:  No visual changes, wkns, changes in mental status. All other systems reviewed and are otherwise negative except as noted above.  Physical Exam    Blood pressure 122/74, pulse 71, temperature 98.2 F (36.8 C), temperature source Oral, resp. rate 18, height 5\' 1"  (1.549 m), weight 83.4 kg, SpO2 96 %.  General: Pleasant, NAD. Psych: Somnolent. Frequently closed eyes during exam. Delayed response to questions and requests during exam. Neuro: Alert and oriented X 3. Moves all extremities spontaneously. HEENT: Normal  Neck: Supple without bruits. JVD difficult to assess d/t body habitus. Lungs:  Resp regular and unlabored, CTA. Heart: RRR no s3, s4. Flow  murmur heard on exam. Abdomen: Soft, obese, non-tender, BS + x 4.  Extremities: No clubbing, cyanosis. 1+ lower extremity edema. DP/PT/Radials 2+ and equal bilaterally.  Labs    Troponin [facility] Test) Recent Labs    03/18/18 1637  TROPIPOC 0.00   No results for input(s): CKTOTAL, CKMB, TROPONINI in the last 72 hours. Lab Results  Component Value Date   WBC 15.8 (H) 03/19/2018   HGB 10.7 (L) 03/19/2018   HCT 34.3 (L) 03/19/2018   MCV 87.7 03/19/2018   PLT 291 03/19/2018    Recent Labs  Lab 03/19/18 0425  NA 140  K 3.6  CL 108  CO2 26  BUN 19  CREATININE 0.91  CALCIUM 8.6*  PROT 6.8  BILITOT 0.5  ALKPHOS 74  ALT 15  AST 17  GLUCOSE 102*   Lab Results  Component Value Date   CHOL 146 11/07/2017   HDL 57 11/07/2017   LDLCALC 74 11/07/2017   TRIG 73 11/07/2017   Lab Results  Component Value Date   DDIMER 0.33 03/18/2018     Radiology Studies    Dg Chest 2 View  Result Date: 03/18/2018 CLINICAL DATA:  Cough EXAM: CHEST - 2 VIEW COMPARISON:  10/30/2017 chest radiograph. FINDINGS: Stable cardiomediastinal silhouette with mild cardiomegaly. No pneumothorax. No pleural effusion. Lungs appear clear, with no acute consolidative airspace disease and no pulmonary edema. IMPRESSION: Stable mild cardiomegaly without overt pulmonary edema. No active pulmonary disease. Electronically Signed   By: Delbert Phenix M.D.   On: 03/18/2018 17:03   Ct Head Wo Contrast  Result Date: 03/18/2018 CLINICAL DATA:  Sudden onset of dizziness, vomiting, syncopal episode EXAM: CT HEAD WITHOUT CONTRAST TECHNIQUE: Contiguous axial images were obtained from the base of the skull through the vertex without intravenous contrast. COMPARISON:  CT brain scan of 11/25/2016 FINDINGS: Brain: The ventricular system is unchanged in size and configuration, and the septum remains midline in position. The fourth ventricle and basilar cisterns are unremarkable. Small vessel disease is again noted within  the periventricular white matter and is unchanged. No new hemorrhage, mass lesion, or acute infarction is seen. Vascular: No vascular abnormality is noted on this unenhanced study. Skull: On bone window images, no calvarial abnormality is seen. Sinuses/Orbits: The paranasal sinuses are well pneumatized. Other: None. IMPRESSION: No acute intracranial abnormality. Stable mild chronic small vessel  disease. Electronically Signed   By: Dwyane DeePaul  Barry M.D.   On: 03/18/2018 16:50    ECG & Cardiac Imaging    03/19/2018 TTE Study Conclusions - Left ventricle: The cavity size was normal. Wall thickness was   normal. Systolic function was normal. The estimated ejection   fraction was in the range of 60% to 65%. Wall motion was normal;   there were no regional wall motion abnormalities. Doppler   parameters are consistent with abnormal left ventricular   relaxation (grade 1 diastolic dysfunction). - Pericardium, extracardiac: A trivial pericardial effusion was   identified.  07/12/2017 TTE - Left ventricle: The cavity size was normal. Wall thickness was   normal. Systolic function was normal. The estimated ejection   fraction was in the range of 60% to 65%. Wall motion was normal;   there were no regional wall motion abnormalities. Features are   consistent with a pseudonormal left ventricular filling pattern,   with concomitant abnormal relaxation and increased filling   pressure (grade 2 diastolic dysfunction). - Pericardium, extracardiac: A trivial pericardial effusion was   identified posterior to the heart.  06/26/2017  Holter  Sinus rhythm. No atrial fibrillation, no pauses. No adverse tachycardia  Symptoms of chest pain correlate with sinus rhythm.   Assessment & Plan    1. Syncopal episode - HPI as above with previous syncopal episodes - Echo as above with EF 60-65%, G1DD, and trivial pericardial effusion.  - EKG without diffuse STE / signs of pericarditis. - CXR and CT head as  above. LE vas US without evidence of DVT  - Holter monitor results as above with no arrhythmia.  - Ddimer negative - Troponin negative - TSH 3.176 - Cr 0.91 Baseline 0.80 - Albumin 3.0; AST and ALT unremarkable.  - Continue IVF - Replete potassium as below with goal 4.0  - Continue to monitor electrolytes given hypokalemia below. - Daily BMET. - Likely vasovagal component given coughing fit proceeding syncopal episode and worsened in setting of hypokalemia, anemia, +/- dehydration.   -Pending Dr. Elease HashimotoNahser evaluation regarding decision for repeat holter / further outpatient workup.  2. Hypokalemia - K 3.1 at admission  3.6. Replete with goal 4.0 - Magnesium at goal 2.0 - Ddx includes dehydration, electrolyte imbalance - Continue IVF and electrolyte repletion   3. Anemia - Hgb 10.7; RBC 3.91 - Daily CBC - Likely contributing to pre-syncopal sx / weakness   4. Leukocytosis - WBC 15.8 - UA without signs of infectious process  - Per IM  For questions or updates, please contact [facility] Please consult [email or web address] for contact info under Cardiology/STEMI.      Lynelle SmokeSigned, Jacquelyn D Visser, PA-C  Pager [phone] 03/19/2018, 1:44 PM    Attending Note:   The patient was seen and examined.  Agree with assessment and plan as noted above.  Changes made to the above note as needed.  Patient seen and independently examined with  Marisue IvanJacquelyn Visser, PA .   We discussed all aspects of the encounter. I agree with the assessment and plan as stated above.  1.  Syncope: In reviewing the history with the patient, it is clear that she does not eat or drink regularly.  She passed out yesterday around 2 PM.  She had only had a bowl of rice crispies to eat that day.  She does not drink water regularly.  The episode was preceded by nausea and vomiting and coughing.  Suspect that there is also vagal component to her syncope.  Had similar episodes in the past.  She has worn a 30-day event  monitor and was not found to have any significant abnormalities.  Echo cardiogram today reveals normal left ventricular systolic function.  She has grade 1 diastolic dysfunction.  Trivial and nonsignificant pericardial effusion was seen.  I have encouraged her to eat and drink on a more regular schedule.  At this point I would not necessarily schedule her for a repeat monitor but I would encourage her to eat and drink regularly and see if this solves her problem of recurrent episodes of syncope.  2.  Essential hypertension: The patient is on several blood pressure medications.  She is on lisinopril HCTZ as well as potassium chloride 20 mEq twice a day.  Despite the potassium supplement, her potassium levels have been chronically low.  I suspect that she has a high renin hypertension and I think that she would do better on Aldactone. We will discontinue the lisinopril HCTZ and start her on Aldactone 25 mg a day.  We will also stop the potassium supplementation.  She may need some additional potassium but I would follow her potassium levels in added back and as needed. Continue carvedilol at current dose.   I have spent a total of 40 minutes with patient reviewing hospital  notes , telemetry, EKGs, labs and examining patient as well as establishing an assessment and plan that was discussed with the patient. > 50% of time was spent in direct patient care.    Vesta Mixer, Montez Hageman., MD, [facility] 03/19/2018, 5:45 PM [address],  [address] Office [phone] Pager [phone]

## 2018-03-25 ENCOUNTER — Encounter: Payer: Self-pay | Admitting: Family Medicine

## 2018-03-25 ENCOUNTER — Other Ambulatory Visit: Payer: Self-pay

## 2018-03-25 ENCOUNTER — Ambulatory Visit: Payer: Medicaid Other | Admitting: Family Medicine

## 2018-03-25 VITALS — BP 104/58 | HR 75 | Temp 98.2°F | Ht 61.0 in | Wt 185.0 lb

## 2018-03-25 DIAGNOSIS — E876 Hypokalemia: Secondary | ICD-10-CM | POA: Diagnosis not present

## 2018-03-25 DIAGNOSIS — R55 Syncope and collapse: Secondary | ICD-10-CM

## 2018-03-25 MED ORDER — ONDANSETRON HCL 8 MG PO TABS: 8.0000 mg | ORAL_TABLET | Freq: Three times a day (TID) | ORAL | 0 refills | Status: DC | PRN

## 2018-03-25 MED ORDER — RANITIDINE HCL 150 MG PO TABS: 150.0000 mg | ORAL_TABLET | Freq: Two times a day (BID) | ORAL | 11 refills | Status: DC

## 2018-03-25 NOTE — Progress Notes (Signed)
Subjective:    Megan Miller - 62 y.o. female MRN [id number]  Date of birth: 1955/08/28  HPI  Megan Miller is here for hospital follow-up after having an episode of syncope.  A variety of testing was performed in the hospital with no cause of her syncope found.  Syncope was determined to likely be due to a vasovagal response.  At discharge, patient's lisinopril-HCTZ was discontinued, and her carvedilol dose was decreased.  She was also started on spironolactone 25 mg daily due to her hypokalemia during the hospital stay.  Patient says that she has not yet started her spironolactone and new dose of carvedilol, but she is picking them up from the pharmacy today.  She says that she has been feeling overall okay, although she has had some nausea.  She has not had any syncopal events.  She does note feeling somewhat dizzy when she goes from sitting to standing.  Health Maintenance:  -Like flu shot today There are no preventive care reminders to display for this patient.  -  reports that she has never smoked. She has never used smokeless tobacco. - Review of Systems: Per HPI. - Past Medical History: Patient Active Problem List   Diagnosis Date Noted  . Calf cramp 03/04/2018  . Gait instability 01/08/2018  . Chronic migraine without aura without status migrainosus, not intractable 11/13/2017  . Tension headache 11/13/2017  . Lumbosacral radiculopathy 11/12/2017  . Cerebrovascular accident (CVA) (HCC) 11/08/2017  . Spinal stenosis of lumbar region 11/08/2017  . Syncope 11/15/2015  . Edema of right foot 01/27/2015  . Intertrigo 12/30/2014  . Stable angina (HCC) 09/23/2014  . Hot flashes 11/27/2013  . Depression 03/18/2013  . 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 OVERWEIGHT 05/05/2009  . CONSTIPATION, CHRONIC 02/10/2009  . DYSTHYMIC DISORDER 02/06/2008  . GERD 02/06/2008  . HYPERTENSION, BENIGN 10/09/2006  . Chronic low back pain 10/09/2006   - Medications: reviewed and updated     Objective:   Physical Exam BP (!) 104/58   Pulse 75   Temp 98.2 F (36.8 C) (Oral)   Ht 5\' 1"  (1.549 m)   Wt 185 lb (83.9 kg)   SpO2 98%   BMI 34.96 kg/m  Gen: NAD, alert, cooperative with exam, well-appearing CV: RRR, good S1/S2, no murmur, no edema, capillary refill brisk  Resp: CTABL, no wheezes, non-labored Abd: SNTND, BS present, no guarding or organomegaly     Assessment & Plan:   Syncope Likely a combination of vasovagal and orthostatic given patient's history and low normal blood pressure in the office today.  Hopefully will improve with decreasing carvedilol dose and stopping lisinopril HCTZ.  Spironolactone 25 mg should not be potent enough to further lower her blood pressure significantly.  Will obtain BMP today to ensure that patient is no longer hypokalemic.  Patient also given prescription for Zofran given her nausea.  Patient given return precautions including worsening orthostatic symptoms.    Lezlie Octave, M.D. 03/26/2018, 11:33 AM PGY-2, [facility]

## 2018-03-25 NOTE — Patient Instructions (Signed)
It was nice meeting you today Ms. Stallbaumer!  Please start your spironolactone and carvedilol when you pick it up today.  Please come back in if your dizziness gets worse.  We are checking your electrolytes today.  We will let you know if there are any abnormalities.  If you have any questions or concerns, please feel free to call the clinic.   Be well,  Dr. Frances FurbishWinfrey

## 2018-03-26 ENCOUNTER — Encounter: Payer: Self-pay | Admitting: Family Medicine

## 2018-03-26 LAB — BASIC METABOLIC PANEL
BUN/Creatinine Ratio: 17 (ref 12–28)
BUN: 15 mg/dL (ref 8–27)
CALCIUM: 9.3 mg/dL (ref 8.7–10.3)
CO2: 25 mmol/L (ref 20–29)
Chloride: 100 mmol/L (ref 96–106)
Creatinine, Ser: 0.89 mg/dL (ref 0.57–1.00)
GFR, EST AFRICAN AMERICAN: 80 mL/min/{1.73_m2} (ref >59)
GFR, EST NON AFRICAN AMERICAN: 69 mL/min/{1.73_m2} (ref >59)
GLUCOSE: 95 mg/dL (ref 65–99)
Potassium: 3.9 mmol/L (ref 3.5–5.2)
Sodium: 141 mmol/L (ref 134–144)

## 2018-03-26 NOTE — Assessment & Plan Note (Signed)
Likely a combination of vasovagal and orthostatic given patient's history and low normal blood pressure in the office today.  Hopefully will improve with decreasing carvedilol dose and stopping lisinopril HCTZ.  Spironolactone 25 mg should not be potent enough to further lower her blood pressure significantly.  Will obtain BMP today to ensure that patient is no longer hypokalemic.  Patient also given prescription for Zofran given her nausea.  Patient given return precautions including worsening orthostatic symptoms.

## 2018-04-01 ENCOUNTER — Telehealth: Payer: Self-pay

## 2018-04-01 NOTE — Telephone Encounter (Signed)
Pt called states the ranitidine isn't helping- wants to go back on Aciphex rx. Pt call back [phone] Shawna Orleans, RN

## 2018-04-03 NOTE — Telephone Encounter (Signed)
Patient has called a second time about this. Has difficulty swallowing the Ranitidine and states it makes her feel sick and doesn't seem to help.  OK to take Aciphex instead?  Call back is [phone]  Ples Specter, RN [facility] RN)

## 2018-04-04 ENCOUNTER — Other Ambulatory Visit: Payer: Self-pay | Admitting: Family Medicine

## 2018-04-04 DIAGNOSIS — K219 Gastro-esophageal reflux disease without esophagitis: Secondary | ICD-10-CM

## 2018-04-04 MED ORDER — RABEPRAZOLE SODIUM 20 MG PO TBEC: 20.0000 mg | DELAYED_RELEASE_TABLET | Freq: Every day | ORAL | 2 refills | Status: DC

## 2018-04-04 NOTE — Telephone Encounter (Signed)
I have sent a 3 month supply of aciphex to her pharmacy.  There are adverse affects of long term PPI use and I would like to get her off Aciphex.  Please call to schedule an appointment in 3 months to discuss her GERD. Please remind her to bring all her medications to this visit.  Thank you, Mirian Mo, MD

## 2018-04-08 ENCOUNTER — Ambulatory Visit: Payer: Medicaid Other | Admitting: Family Medicine

## 2018-04-16 ENCOUNTER — Telehealth: Payer: Self-pay | Admitting: *Deleted

## 2018-04-16 NOTE — Telephone Encounter (Signed)
Pt wants to let MD know that since she has started aldactone she has felt groggy every morning when getting up.  She denies dizziness or syncope.  Made appt for 05/23/18. Andres Bantz, [name], CMA

## 2018-04-17 NOTE — Telephone Encounter (Signed)
Pt reports falling on 9/1 when she went to get fast food. While walking, her flip flop caught on something on the floor and pt fell to the ground. Pt reports

## 2018-04-20 NOTE — Telephone Encounter (Signed)
The previous note was closed prematurely.  Here is the additional pertinent information:  Pt was called to address her concern about the grogginess due to her aldactone.  She was informed that aldactone is very unlikely to have caused her grogginess.  There is likely another cause of this new onset grogginess.  Pt was encouraged to continue taking her aldactone.  During our conversation, pt mentioned that she recently experienced a fall. Pt reports walking into a fast food restaurant on 9/1 and falling after her flip-flop caught on the floor.  Pt reports that she had no dizziness or lightheadedness before the fall and that it was purely mechanical.  At no point did she lose consciousness or hit her head. She fell on her hip/thigh primarily.  Pt reports that she still has some musculoskeletal pain in her thigh but it likely due to bruising.    Pt continues to report grogginess when she wakes in the night to go to the bathroom. She was reassured that it is not likely due to her aldactone. Pt continued to state that she does not feel comfortable on her current medication regimen.   At this point, pt noted that she would like to be seen to address her concerns about her medication regimen and its potential side effects.  Pt was scheduled for an appointment on 9/25.

## 2018-04-23 ENCOUNTER — Other Ambulatory Visit: Payer: Self-pay

## 2018-04-23 ENCOUNTER — Ambulatory Visit: Payer: Medicaid Other | Admitting: Family Medicine

## 2018-04-23 ENCOUNTER — Encounter: Payer: Self-pay | Admitting: Family Medicine

## 2018-04-23 DIAGNOSIS — R42 Dizziness and giddiness: Secondary | ICD-10-CM | POA: Diagnosis present

## 2018-04-23 NOTE — Patient Instructions (Addendum)
  For the BP - take 1/2 of the Coreg (Carvedilol) - twice a day  - Take 1/2 of Spironolactone - once a day  Do NOT TAKE - Lisinopril/HCTZ at all  The lightheadness should be better in a few days if not then call us or if you have chest pain or shortness of breath   Come back in 1-2 weeks to see Dr Mikal PlaneFrank   BRING ALL YOUR MEDICATION BOTTLES - OLD AND NEW

## 2018-04-23 NOTE — Progress Notes (Signed)
Subjective  Praise L January is a 62 y.o. female is presenting with the following  Lightheadness  Feels lightheadness and dizzy when gets up.  Thinks the spironolactone may be causing it.  Has a fall the first of Sept.  No injury. No chest pain or shortness of breath or leg swelling. On review of her medications she is still taking lisinopril/hctz as well as coreg and spiro  Chief Complaint noted Review of Symptoms - see HPI PMH - Smoking status noted.    Objective Vital Signs reviewed BP 114/74   Pulse 84   Temp 98.1 F (36.7 C) (Oral)   Ht 5\' 1"  (1.549 m)   Wt 183 lb 3.2 oz (83.1 kg)   SpO2 99%   BMI 34.62 kg/m  Able to stand and walk but feels a little lightheadness and definitely when does deep knee bend. Walks with slight limp on L Heart - Regular rate and rhythm.  No murmurs, gallops or rubs.    Lungs:  Normal respiratory effort, chest expands symmetrically. Lungs are clear to auscultation, no crackles or wheezes. Extremities:  No cyanosis, edema, or deformity noted with good range of motion of all major joints.    Assessments/Plans  See after visit summary for details of patient instuctions  Lightheadedness Most consistent with dehydration and excessive antihtn medications.  Reiterated to stop her ace/hctz combo and cut back to half for coreg and spiro and to follow up.  See after visit summary,.  No signs of ACS or new stroke

## 2018-04-23 NOTE — Assessment & Plan Note (Addendum)
Most consistent with dehydration and excessive antihtn medications.  Reiterated to stop her ace/hctz combo and cut back to half for coreg and spiro and to follow up.  See after visit summary,.  No signs of ACS or new stroke

## 2018-05-07 ENCOUNTER — Other Ambulatory Visit: Payer: Self-pay | Admitting: Student in an Organized Health Care Education/Training Program

## 2018-05-17 ENCOUNTER — Other Ambulatory Visit: Payer: Self-pay | Admitting: Student in an Organized Health Care Education/Training Program

## 2018-05-22 ENCOUNTER — Other Ambulatory Visit: Payer: Self-pay | Admitting: Family Medicine

## 2018-05-22 DIAGNOSIS — Z1231 Encounter for screening mammogram for malignant neoplasm of breast: Secondary | ICD-10-CM

## 2018-05-23 ENCOUNTER — Ambulatory Visit
Admission: RE | Admit: 2018-05-23 | Discharge: 2018-05-23 | Disposition: A | Discharge status: Home / Self Care | Payer: Medicaid Other | Source: Ambulatory Visit | Attending: Family Medicine | Admitting: Family Medicine

## 2018-05-23 ENCOUNTER — Ambulatory Visit: Payer: Medicaid Other | Admitting: Family Medicine

## 2018-05-23 DIAGNOSIS — Z1231 Encounter for screening mammogram for malignant neoplasm of breast: Secondary | ICD-10-CM

## 2018-06-03 ENCOUNTER — Ambulatory Visit: Payer: Medicaid Other | Admitting: Family Medicine

## 2018-07-09 ENCOUNTER — Ambulatory Visit: Payer: Medicaid Other | Admitting: Family Medicine

## 2018-07-09 ENCOUNTER — Telehealth: Payer: Self-pay | Admitting: *Deleted

## 2018-07-09 ENCOUNTER — Other Ambulatory Visit: Payer: Self-pay

## 2018-07-09 VITALS — BP 128/74 | HR 77 | Temp 98.0°F | Ht 61.0 in | Wt 191.0 lb

## 2018-07-09 DIAGNOSIS — M1711 Unilateral primary osteoarthritis, right knee: Secondary | ICD-10-CM | POA: Insufficient documentation

## 2018-07-09 DIAGNOSIS — M25552 Pain in left hip: Secondary | ICD-10-CM | POA: Diagnosis not present

## 2018-07-09 MED ORDER — ACETAMINOPHEN ER 650 MG PO TBCR: 650.0000 mg | EXTENDED_RELEASE_TABLET | Freq: Three times a day (TID) | ORAL | 0 refills | Status: DC | PRN

## 2018-07-09 MED ORDER — DICLOFENAC SODIUM 1 % TD GEL: 2.0000 g | Freq: Four times a day (QID) | TRANSDERMAL | 0 refills | Status: DC

## 2018-07-09 MED ORDER — BACLOFEN 10 MG PO TABS: 5.0000 mg | ORAL_TABLET | Freq: Two times a day (BID) | ORAL | 0 refills | Status: DC

## 2018-07-09 NOTE — Patient Instructions (Addendum)
It was a pleasure to see you today! Thank you for choosing [facility] for your primary care. Megan Miller was seen for knee and hip pain.   We are starting two medicines listed below. Stop using ibuprofen. You may use tylenol.   Meds ordered this encounter  Medications  . baclofen (LIORESAL) 10 MG tablet    Sig: Take 0.5 tablets (5 mg total) by mouth 2 (two) times daily.    Dispense:  30 each    Refill:  0  . diclofenac sodium (VOLTAREN) 1 % GEL    Sig: Apply 2 g topically 4 (four) times daily.    Dispense:  1 Tube    Refill:  0  . acetaminophen (TYLENOL 8 HOUR) 650 MG CR tablet    Sig: Take 1 tablet (650 mg total) by mouth every 8 (eight) hours as needed for pain.    Dispense:  60 tablet    Refill:  0     Best,  Thomes DinningBrad , MD, MS FAMILY MEDICINE RESIDENT - PGY1 07/09/2018 10:40 AM

## 2018-07-09 NOTE — Progress Notes (Signed)
Acute Office Visit  Subjective:    Patient ID: Megan Miller, female    DOB: [birth year], 62 y.o.   MRN: [id number]  Chief Complaint  Patient presents with  . Muscle Cramps  . Fall    Patient is coming for evaluation for left hip and left knee pain that she has had for 1 month status post fall.  Patient says that she is tried ibuprofen which does help with her pain, however that the pain then comes back.  The pain is getting better as time goes forward.  She says that this is pain that is different from her ongoing osteoarthritic pain of her knees.  Patient is not fallen since then.  Patient is able to walk without a cane.  Patient says that she sometimes gets leg cramping as well and leg stiffness.  She is not tried anything for the stiffness.  Patient is no bowel or bladder dysfunction.  She has no saddle anesthesia.  She denies any weakness from fall.  Review of Systems  Constitutional: Negative for chills, fever and weight loss.  Musculoskeletal: Positive for falls and myalgias.  Neurological: Negative for dizziness, focal weakness, weakness and headaches.  All other systems reviewed and are negative.      Objective:    Physical Exam  Constitutional: She appears well-developed and well-nourished.  HENT:  Head: Normocephalic and atraumatic.  Eyes: Conjunctivae are normal. No scleral icterus.  Neck: No JVD present.  Cardiovascular: Normal rate and regular rhythm.  Pulmonary/Chest: Effort normal and breath sounds normal. No stridor.  Abdominal: Soft. Bowel sounds are normal. She exhibits no distension.  Musculoskeletal:       Left hip: Normal. She exhibits normal range of motion, normal strength, no tenderness, no swelling and no deformity.       Left knee: Normal. She exhibits normal range of motion, no swelling, no effusion and no bony tenderness. No tenderness found.  Neurological: She is alert. She exhibits normal muscle tone.  Skin: Skin is warm and dry.    Psychiatric: She has a normal mood and affect. Her behavior is normal.    BP 128/74   Pulse 77   Temp 98 F (36.7 C) (Oral)   Ht 5\' 1"  (1.549 m)   Wt 191 lb (86.6 kg)   SpO2 99%   BMI 36.09 kg/m  Wt Readings from Last 3 Encounters:  07/09/18 191 lb (86.6 kg)  04/23/18 183 lb 3.2 oz (83.1 kg)  03/25/18 185 lb (83.9 kg)      Assessment & Plan:   Problem List Items Addressed This Visit      Musculoskeletal and Integument   Primary osteoarthritis of right knee - Primary   Relevant Medications   baclofen (LIORESAL) 10 MG tablet   diclofenac sodium (VOLTAREN) 1 % GEL   acetaminophen (TYLENOL 8 HOUR) 650 MG CR tablet     Other   Pain of left hip joint    Patient presenting with ongoing pain after a fall.  Not having any more falls.  Believe the pain was mostly from mechanical trauma.  Patient has history of CVA and is not a candidate to be on NSAIDs.  Recommend topical analgesia.  Patient also having muscle cramps, recommend low-dose muscle relaxer.  Warned patient about potential sedative effects of muscle relaxer.  Patient to follow-up in 1 month.  Patient may take Tylenol as well.      Relevant Medications   baclofen (LIORESAL) 10 MG tablet   diclofenac sodium (  VOLTAREN) 1 % GEL   acetaminophen (TYLENOL 8 HOUR) 650 MG CR tablet       Meds ordered this encounter  Medications  . baclofen (LIORESAL) 10 MG tablet    Sig: Take 0.5 tablets (5 mg total) by mouth 2 (two) times daily.    Dispense:  30 each    Refill:  0  . diclofenac sodium (VOLTAREN) 1 % GEL    Sig: Apply 2 g topically 4 (four) times daily.    Dispense:  1 Tube    Refill:  0  . acetaminophen (TYLENOL 8 HOUR) 650 MG CR tablet    Sig: Take 1 tablet (650 mg total) by mouth every 8 (eight) hours as needed for pain.    Dispense:  60 tablet    Refill:  0     Garnette GunnerAaron B , MD

## 2018-07-09 NOTE — Telephone Encounter (Signed)
Received fax from [facility] requesting prior authorization of diclofenac.  Contacted pharmacy and asked to run as voltaren as this is preferred.  I was informed that voltaren was on backorder and only generic is available.  Completed PA in [employer]. Was approved until 07/09/19  PA # [id number].  Informed pharmacy. It is still saying rejected on her end.  She will try again in 30 minutes.   Fleeger, [name], CMA

## 2018-07-09 NOTE — Assessment & Plan Note (Signed)
Patient presenting with ongoing pain after a fall.  Not having any more falls.  Believe the pain was mostly from mechanical trauma.  Patient has history of CVA and is not a candidate to be on NSAIDs.  Recommend topical analgesia.  Patient also having muscle cramps, recommend low-dose muscle relaxer.  Warned patient about potential sedative effects of muscle relaxer.  Patient to follow-up in 1 month.  Patient may take Tylenol as well.

## 2018-08-01 ENCOUNTER — Other Ambulatory Visit: Payer: Self-pay | Admitting: Family Medicine

## 2018-08-01 DIAGNOSIS — K219 Gastro-esophageal reflux disease without esophagitis: Secondary | ICD-10-CM

## 2018-08-05 ENCOUNTER — Telehealth: Payer: Self-pay | Admitting: *Deleted

## 2018-08-05 NOTE — Telephone Encounter (Signed)
Patient dropped off form on 07-31-2018.  There was no previous documentation of note being in blue team folder and it was discovered today.  Clinical portion completed on form and placed in Dr. Cardell PeachFrank's box.  Candance Bohlman,CMA

## 2018-08-06 ENCOUNTER — Other Ambulatory Visit: Payer: Self-pay | Admitting: *Deleted

## 2018-08-06 DIAGNOSIS — M25552 Pain in left hip: Secondary | ICD-10-CM

## 2018-08-06 DIAGNOSIS — M1711 Unilateral primary osteoarthritis, right knee: Secondary | ICD-10-CM

## 2018-08-06 DIAGNOSIS — K219 Gastro-esophageal reflux disease without esophagitis: Secondary | ICD-10-CM

## 2018-08-06 MED ORDER — DICLOFENAC SODIUM 1 % TD GEL: 2.0000 g | Freq: Four times a day (QID) | TRANSDERMAL | 0 refills | Status: DC

## 2018-08-06 MED ORDER — RABEPRAZOLE SODIUM 20 MG PO TBEC: 20.0000 mg | DELAYED_RELEASE_TABLET | Freq: Every day | ORAL | 2 refills | Status: DC

## 2018-08-07 ENCOUNTER — Telehealth: Payer: Self-pay

## 2018-08-07 NOTE — Telephone Encounter (Signed)
Received fax from [facility] requesting prior authorization of Diclofenac gel. Prior approval for Diclofenac gel completed via [employer].  Med approved for 08/07/18 - 08/07/19  Prior approval # [id number].  [facility] informed.  Nigel Mormon, RN

## 2018-08-07 NOTE — Telephone Encounter (Signed)
Form signed and placed in folder in front office.

## 2018-08-08 NOTE — Telephone Encounter (Signed)
New handicap form filled out due to an error. Had preceptor sign. Left message for patient that form available for pick up at front desk. Copy made for batch scanning. Ples Specter, RN [facility] RN)

## 2018-08-11 ENCOUNTER — Other Ambulatory Visit: Payer: Self-pay | Admitting: Family Medicine

## 2018-08-11 DIAGNOSIS — K219 Gastro-esophageal reflux disease without esophagitis: Secondary | ICD-10-CM

## 2018-08-13 ENCOUNTER — Telehealth: Payer: Self-pay | Admitting: *Deleted

## 2018-08-13 NOTE — Telephone Encounter (Signed)
Script has been faxed to pharmacy per request.

## 2018-08-13 NOTE — Telephone Encounter (Signed)
Patient calls because she is unable to pick up her name brad of aciphex.  Called pharmacy.  They will need a handwritten script faxed to them @ [phone] for name brand medications.   Script given to Dr. Homero Fellers to complete and then I will fax. Fleeger, [name], CMA

## 2018-09-05 ENCOUNTER — Other Ambulatory Visit: Payer: Self-pay | Admitting: Family Medicine

## 2018-09-05 DIAGNOSIS — M1711 Unilateral primary osteoarthritis, right knee: Secondary | ICD-10-CM

## 2018-09-05 DIAGNOSIS — M25552 Pain in left hip: Secondary | ICD-10-CM

## 2018-09-09 ENCOUNTER — Other Ambulatory Visit: Payer: Self-pay

## 2018-09-11 MED ORDER — SPIRONOLACTONE 25 MG PO TABS: 25.0000 mg | ORAL_TABLET | Freq: Every day | ORAL | 2 refills | Status: DC

## 2018-09-26 ENCOUNTER — Other Ambulatory Visit: Payer: Self-pay

## 2018-09-26 ENCOUNTER — Encounter: Payer: Self-pay | Admitting: Family Medicine

## 2018-09-26 ENCOUNTER — Ambulatory Visit: Payer: Medicaid Other | Admitting: Family Medicine

## 2018-09-26 VITALS — BP 128/76 | HR 80 | Temp 98.0°F | Wt 187.0 lb

## 2018-09-26 DIAGNOSIS — M25552 Pain in left hip: Secondary | ICD-10-CM | POA: Diagnosis not present

## 2018-09-26 DIAGNOSIS — L659 Nonscarring hair loss, unspecified: Secondary | ICD-10-CM | POA: Diagnosis not present

## 2018-09-26 MED ORDER — MINOXIDIL 2 % EX SOLN: Freq: Two times a day (BID) | CUTANEOUS | 0 refills | Status: DC

## 2018-09-26 NOTE — Assessment & Plan Note (Addendum)
History and physical exam are highly suspicious for osteoarthritis of the left hip.  Low suspicion for sciatica or lumbar nerve impingement.  No recent trauma.  Low suspicion for fractures. -Follow-up hip x-rays -Tylenol PRN for pain -Motrin occasionally for bad pain.  Advised against daily use of Motrin due to her history of angina and CVA -Referral to physical therapy

## 2018-09-26 NOTE — Patient Instructions (Addendum)
Hair loss: -continue using Rogaine -if you are not satisfied with the Rogaine after several months, we can refer you to dermatology although I do not think that would be helpful at this time  Hip pain -We will get a hip x ray to make sure there is nothing else contributing to your hip pain -Use tylenol at home for pain, you may occasionally use NSAIDs (motrin) but try not to use it every day -I will put in a referral to physical therapy

## 2018-09-26 NOTE — Progress Notes (Signed)
    Subjective:  Megan Miller is a 63 y.o. female who presents to the [facility] today with a chief complaint of hair loss and left hip pain.   HPI: Hair loss Ms. Fogleman reports hair loss over her scalp for roughly the past year.  She notes significant hair coming out on her comb in addition to significant thinning of her hair.  She has not found any specific bald spots believes that her hair loss is general over her entire head.  She has tried Rogaine in the past (2015) but has not used it in the past several years.  Left hip pain She reports ongoing left hip pain for the past year.  She first noticed it after a fall about 1 year ago and suspects that she has a muscle injury that is never fully recovered.  She describes the pain as a 10/10 pain inside of her left hip that is sometimes stabbing and sometimes dull or throbbing in character.  She reports occasional shooting pains down her leg into her lower back.  This discomfort bothers her most in the mornings when she wakes up and when walking.  She has discussed this problem with a physician before who advised Tylenol for likely osteoarthritis.  She has a history of osteoarthritis of the right knee.  She reports additional musculoskeletal complaints that we are not able to cover today.  No evidence of fever, weight loss.  Chief Complaint noted Review of Symptoms - see HPI PMH - Smoking status noted.    Objective:  Physical Exam: BP 128/76   Pulse 80   Temp 98 F (36.7 C) (Oral)   Wt 84.8 kg   SpO2 98%   BMI 35.33 kg/m    Gen: NAD, resting comfortably in no apparent pain.  Mildly antalgic gait walking around the room.  She must prop herself with her right arm when returning to a seated position from standing. HEENT: No apparent rashes on scalp, no patches of hair loss noted mildly decreased follicular density.  No excoriations or evidence of plucked hairs. MSK: Straight leg raise negative bilaterally.  Pain elicited with flexion and  internal rotation of the left hip.  Left hip mildly tender to superficial palpation.  Antalgic gait noted around room.   No results found for this or any previous visit (from the past 72 hour(s)).   Assessment/Plan:  Pain of left hip joint History and physical exam are highly suspicious for osteoarthritis of the left hip.  Low suspicion for sciatica or lumbar nerve impingement.  No recent trauma.  Low suspicion for fractures. -Follow-up hip x-rays -Tylenol PRN for pain -Motrin occasionally for bad pain.  Advised against daily use of Motrin due to her history of angina and CVA -Referral to physical therapy  Hair loss Space consistent with typical hair loss of aging.  No evidence of trauma, rashes, fungal infection, trichotillomania.  TSH within normal limits 6 months ago. -Advised to restart Rogaine -We will consider dermatology consult if she is dissatisfied after several months of Rogaine use

## 2018-09-26 NOTE — Assessment & Plan Note (Signed)
Space consistent with typical hair loss of aging.  No evidence of trauma, rashes, fungal infection, trichotillomania.  TSH within normal limits 6 months ago. -Advised to restart Rogaine -We will consider dermatology consult if she is dissatisfied after several months of Rogaine use

## 2018-10-03 ENCOUNTER — Ambulatory Visit: Payer: Medicaid Other | Admitting: Family Medicine

## 2018-11-03 ENCOUNTER — Ambulatory Visit
Admission: RE | Admit: 2018-11-03 | Discharge: 2018-11-03 | Disposition: A | Discharge status: Home / Self Care | Payer: Medicaid Other | Source: Ambulatory Visit | Attending: Family Medicine | Admitting: Family Medicine

## 2018-11-03 ENCOUNTER — Other Ambulatory Visit: Payer: Self-pay

## 2018-11-03 DIAGNOSIS — M25552 Pain in left hip: Secondary | ICD-10-CM

## 2018-11-05 ENCOUNTER — Ambulatory Visit: Payer: Medicaid Other | Admitting: Family Medicine

## 2018-11-06 ENCOUNTER — Other Ambulatory Visit: Payer: Self-pay

## 2018-11-06 ENCOUNTER — Telehealth (INDEPENDENT_AMBULATORY_CARE_PROVIDER_SITE_OTHER): Payer: Medicaid Other | Admitting: Student in an Organized Health Care Education/Training Program

## 2018-11-06 DIAGNOSIS — L659 Nonscarring hair loss, unspecified: Secondary | ICD-10-CM | POA: Diagnosis not present

## 2018-11-06 DIAGNOSIS — M1612 Unilateral primary osteoarthritis, left hip: Secondary | ICD-10-CM

## 2018-11-06 NOTE — Progress Notes (Signed)
[facility] Telemedicine Visit  Patient consented to have visit conducted via telephone.  Encounter participants: Patient: Megan Miller  Provider: Howard Pouch  Others (if applicable): None  Chief Complaint:     2. Hair coming out  Pain when sleeps on that side Stiffness No fevers No skin changes   HPI: 1. Xrays -patient reports that she was previously seen in the office after a fall.  X-rays of her hip were ordered.  She would like a result of this today.  She reports that she has bilateral hip stiffness that is worse when she first gets out of bed or gets out of the seated position after prolonged sitting.  She reports that she has pain when she is walking.  She has pain when she sleeps on her side.  She reports that she has tried taking Tylenol, but she is unsure of the dose.  She has not had any fevers.  She has not had any skin changes.  She has not had any changes in gait.  No recent falls, her last fall was over a month ago.  2. Hair loss She reports hair loss -has been ongoing since her stroke.  She was previously seen and evaluated for this problem and recommended to start Rogaine.  She reports that she did not start Rogaine because it is $25 and she feels that this is too expensive.  ROS: No fevers, changes in gait, or skin changes  Pertinent PMHx:  HTN Overweight Hx of stroke  Exam:  Respiratory: Comfortable work of breathing, speaks in full sentences.  Oriented and appropriate  Assessment/Plan:  No problem-specific Assessment & Plan notes found for this encounter.   1.  Osteoarthritis of the bilateral hips  -patient seems to be having an acute flare of her degenerative joint disease of the hips.  She does not have any red flag symptoms of infection based on the history she is providing over the phone.  It is impossible to fully evaluate without physical exam. -No acute bony injury on her previous x-ray -Encourage standing Tylenol 1000  mg 3 times daily -Continued movement -Offered PT referral, patient refuses -Encouraged patient to give [place] a call back if her symptoms become unmanageable at home, or if she develops fevers, or change in gait  2. Hair loss -patient has history of hair loss.  She is previously evaluated for this in her last office visit.  She has not noted to have any rash or evidence of section on the skin of her scalp at that time.   We discussed that while her hair loss is important, it may not be an emergency.  She agrees that it is best to postpone further evaluation and management of hair loss until after coronavirus subsides in order to allow her to stay home and stay safe.  If she changes her mind about this she is advised to call back.  time spent on phone with patient: 15 minutes

## 2018-11-10 ENCOUNTER — Other Ambulatory Visit: Payer: Self-pay

## 2018-11-11 NOTE — Telephone Encounter (Signed)
2nd request from pharmacy. Megan Miller, CMA  

## 2018-11-14 MED ORDER — ATORVASTATIN CALCIUM 40 MG PO TABS: 40.0000 mg | ORAL_TABLET | Freq: Every day | ORAL | 3 refills | Status: DC

## 2018-11-21 ENCOUNTER — Emergency Department (HOSPITAL_COMMUNITY)
Admission: EM | Admit: 2018-11-21 | Discharge: 2018-11-21 | Disposition: A | Discharge status: Home / Self Care | Payer: Medicaid Other | Attending: Emergency Medicine | Admitting: Emergency Medicine

## 2018-11-21 ENCOUNTER — Emergency Department (HOSPITAL_COMMUNITY): Payer: Medicaid Other

## 2018-11-21 ENCOUNTER — Encounter (HOSPITAL_COMMUNITY): Payer: Self-pay | Admitting: Emergency Medicine

## 2018-11-21 ENCOUNTER — Other Ambulatory Visit: Payer: Self-pay

## 2018-11-21 DIAGNOSIS — K5909 Other constipation: Secondary | ICD-10-CM | POA: Insufficient documentation

## 2018-11-21 DIAGNOSIS — K210 Gastro-esophageal reflux disease with esophagitis: Secondary | ICD-10-CM | POA: Diagnosis not present

## 2018-11-21 DIAGNOSIS — I10 Essential (primary) hypertension: Secondary | ICD-10-CM | POA: Insufficient documentation

## 2018-11-21 DIAGNOSIS — Z79899 Other long term (current) drug therapy: Secondary | ICD-10-CM | POA: Insufficient documentation

## 2018-11-21 DIAGNOSIS — R140 Abdominal distension (gaseous): Secondary | ICD-10-CM | POA: Diagnosis present

## 2018-11-21 LAB — CBC WITH DIFFERENTIAL/PLATELET
Abs Immature Granulocytes: 0.05 10*3/uL (ref 0.00–0.07)
Basophils Absolute: 0 10*3/uL (ref 0.0–0.1)
Basophils Relative: 0 %
Eosinophils Absolute: 0 10*3/uL (ref 0.0–0.5)
Eosinophils Relative: 0 %
HCT: 36.9 % (ref 36.0–46.0)
Hemoglobin: 11.7 g/dL — ABNORMAL LOW (ref 12.0–15.0)
Immature Granulocytes: 1 %
Lymphocytes Relative: 38 %
Lymphs Abs: 3.1 10*3/uL (ref 0.7–4.0)
MCH: 27.9 pg (ref 26.0–34.0)
MCHC: 31.7 g/dL (ref 30.0–36.0)
MCV: 88.1 fL (ref 80.0–100.0)
Monocytes Absolute: 0.5 10*3/uL (ref 0.1–1.0)
Monocytes Relative: 6 %
Neutro Abs: 4.6 10*3/uL (ref 1.7–7.7)
Neutrophils Relative %: 55 %
Platelets: 278 10*3/uL (ref 150–400)
RBC: 4.19 MIL/uL (ref 3.87–5.11)
RDW: 13.8 % (ref 11.5–15.5)
WBC: 8.2 10*3/uL (ref 4.0–10.5)
nRBC: 0 % (ref 0.0–0.2)

## 2018-11-21 LAB — COMPREHENSIVE METABOLIC PANEL
ALT: 14 U/L (ref 0–44)
AST: 19 U/L (ref 15–41)
Albumin: 3.4 g/dL — ABNORMAL LOW (ref 3.5–5.0)
Alkaline Phosphatase: 75 U/L (ref 38–126)
Anion gap: 13 (ref 5–15)
BUN: 9 mg/dL (ref 8–23)
CO2: 21 mmol/L — ABNORMAL LOW (ref 22–32)
Calcium: 9 mg/dL (ref 8.9–10.3)
Chloride: 106 mmol/L (ref 98–111)
Creatinine, Ser: 0.69 mg/dL (ref 0.44–1.00)
GFR calc Af Amer: >60 mL/min (ref >60)
GFR calc non Af Amer: >60 mL/min (ref >60)
Glucose, Bld: 97 mg/dL (ref 70–99)
Potassium: 3.4 mmol/L — ABNORMAL LOW (ref 3.5–5.1)
Sodium: 140 mmol/L (ref 135–145)
Total Bilirubin: 0.8 mg/dL (ref 0.3–1.2)
Total Protein: 7.2 g/dL (ref 6.5–8.1)

## 2018-11-21 LAB — LIPASE, BLOOD: Lipase: 23 U/L (ref 11–51)

## 2018-11-21 MED ORDER — FAMOTIDINE 20 MG PO TABS
20.0000 mg | ORAL_TABLET | Freq: Once | ORAL | Status: AC
  Administered 2018-11-21: 20 mg via ORAL
  Filled 2018-11-21: qty 1

## 2018-11-21 MED ORDER — SODIUM CHLORIDE 0.9 % IV BOLUS: 1000.0000 mL | Freq: Once | INTRAVENOUS | Status: DC

## 2018-11-21 MED ORDER — LIDOCAINE VISCOUS HCL 2 % MT SOLN
15.0000 mL | Freq: Once | OROMUCOSAL | Status: AC
  Administered 2018-11-21: 15 mL via ORAL
  Filled 2018-11-21: qty 15

## 2018-11-21 MED ORDER — ALUM & MAG HYDROXIDE-SIMETH 200-200-20 MG/5ML PO SUSP
30.0000 mL | Freq: Once | ORAL | Status: AC
  Administered 2018-11-21: 30 mL via ORAL
  Filled 2018-11-21: qty 30

## 2018-11-21 MED ORDER — POLYETHYLENE GLYCOL 3350 17 G PO PACK: 17.0000 g | PACK | Freq: Two times a day (BID) | ORAL | 0 refills | Status: DC

## 2018-11-21 NOTE — ED Notes (Signed)
Patient verbalizes understanding of discharge instructions. Opportunity for questioning and answering were provided.  patient discharged from ED.  

## 2018-11-21 NOTE — Discharge Instructions (Signed)
Take miralax twice daily until you have normal bowel movements for a week   Stay hydrated.   See your doctor  Return to ER if you have worse abdominal pain, vomiting, fever

## 2018-11-21 NOTE — ED Notes (Signed)
Per dr. Silverio Lay ok to discharge without urine sample

## 2018-11-21 NOTE — ED Triage Notes (Addendum)
Patient reports a lot of gas and feeling like acid comes up in her throat after eating since monday. She also reports generalized abdominal pain and constipation, last BM last Saturday. Tried pepto bismol without relief. No nausea or vomiting. Symptoms worse at night

## 2018-11-21 NOTE — ED Provider Notes (Signed)
[facility] EMERGENCY DEPARTMENT Provider Note   CSN: [id number] Arrival date & time: 11/21/18  1102    History   Chief Complaint Chief Complaint  Patient presents with   Bloated    HPI Megan Miller is a 63 y.o. female hx of GERD, HTN, constipation, here presenting with constipation.  Patient states that her last bowel movement was 6 days ago and she felt that she is distended.  She states that she still passing gas but she was unable to have a bowel movement.  She tried some antacids with minimal relief.  Patient denies any vomiting or fevers.  Denies any recent travel.  Patient does have a history of constipation but denies any abdominal surgeries or history of bowel obstruction.      The history is provided by the patient.    Past Medical History:  Diagnosis Date   Allergic conjunctivitis    Chest pain    Chronic back pain    Constipation, chronic    Dyspnea on exertion    Dysthymic    GERD (gastroesophageal reflux disease)    Hypertension    Leg edema    Asymetric Right leg (dopplers negative)   Overweight (BMI 25.0-29.9)    Stroke [facility])    Urination frequency     Patient Active Problem List   Diagnosis Date Noted   Primary osteoarthritis of right knee 07/09/2018   Pain of left hip joint 07/09/2018   Lightheadedness 04/23/2018   Calf cramp 03/04/2018   Gait instability 01/08/2018   Chronic migraine without aura without status migrainosus, not intractable 11/13/2017   Tension headache 11/13/2017   Lumbosacral radiculopathy 11/12/2017   Cerebrovascular accident (CVA) (HCC) 11/08/2017   Spinal stenosis of lumbar region 11/08/2017   Syncope 11/15/2015   Edema of right foot 01/27/2015   Intertrigo 12/30/2014   Stable angina (HCC) 09/23/2014   Hot flashes 11/27/2013   Hair loss 09/10/2013   Depression 03/18/2013   MYOPIA 08/25/2009   OVERWEIGHT 05/05/2009   CONSTIPATION, CHRONIC 02/10/2009    DYSTHYMIC DISORDER 02/06/2008   GERD 02/06/2008   HYPERTENSION, BENIGN 10/09/2006   Chronic low back pain 10/09/2006    Past Surgical History:  Procedure Laterality Date   ABDOMINAL HYSTERECTOMY  09/28/1999   for leimyomas   BILATERAL SALPINGOOPHORECTOMY  05/08/2001   Path=extensive endometriosis   BREAST SURGERY  12/29/2003   Right breast core biopsy=benign fibrocystic change   SMALL INTESTINE SURGERY  03/30/2001   Rectosigmoid bowel resection and reanastomosis     OB History   No obstetric history on file.      Home Medications    Prior to Admission medications   Medication Sig Start Date End Date Taking? Authorizing Provider  acetaminophen (TYLENOL 8 HOUR) 650 MG CR tablet Take 1 tablet (650 mg total) by mouth every 8 (eight) hours as needed for pain. 07/09/18   Garnette Gunner, MD  aspirin 81 MG tablet Take 1 tablet (81 mg total) by mouth daily. 09/23/14   Glori Luis, MD  atorvastatin (LIPITOR) 40 MG tablet Take 1 tablet (40 mg total) by mouth daily. 11/14/18   Mirian Mo, MD  baclofen (LIORESAL) 10 MG tablet TAKE 0.5 TABLETS (5 MG TOTAL) BY MOUTH 2 (TWO) TIMES DAILY. 09/06/18   Mirian Mo, MD  carvedilol (COREG) 3.125 MG tablet Take 1 tablet (3.125 mg total) by mouth 2 (two) times daily with a meal. 05/20/18 11/16/18  Mirian Mo, MD  desonide (DESOWEN) 0.05 % cream Apply topically  2 (two) times daily. Patient taking differently: Apply 1 application topically daily. For itching 04/08/13   Hairford, Ricki MillerAmber M, MD  diclofenac sodium (VOLTAREN) 1 % GEL Apply 2 g topically 4 (four) times daily. 08/06/18   Mirian MoFrank, Peter, MD  ipratropium (ATROVENT) 0.06 % nasal spray Place 2 sprays into both nostrils 4 (four) times daily. Patient taking differently: Place 2 sprays into both nostrils 4 (four) times daily as needed (seasonal allergies).  09/26/16 03/18/18  Bonney AidHaney, Alyssa A, MD  minoxidil (ROGAINE) 2 % external solution Apply topically 2 (two) times daily. 09/26/18   Mirian MoFrank,  Peter, MD  nitroGLYCERIN (NITRODUR - DOSED IN MG/24 HR) 0.2 mg/hr patch APPLY 1/4 PATCH TO AFFECTED AREA. CHANGE PATCH EVERY 24 HOURS. Patient taking differently: Place 0.2 mg onto the skin daily.     Hairford, Ricki MillerAmber M, MD  nystatin cream (MYCOSTATIN) Apply 1 application topically 2 (two) times daily. 02/13/18   Mirian MoFrank, Peter, MD  olopatadine (PATANOL) 0.1 % ophthalmic solution Place 1 drop into both eyes 2 (two) times daily as needed (seasonal allergies/itching).     [provider]  ondansetron (ZOFRAN) 8 MG tablet Take 1 tablet (8 mg total) by mouth every 8 (eight) hours as needed for nausea or vomiting. 03/25/18   Winfrey, Harlen LabsAmanda C, MD  polyethylene glycol powder (GLYCOLAX/MIRALAX) powder Dissolve 17 grams in at least 8 ounces water/juice and drink daily Patient taking differently: Take 17 g by mouth daily as needed (constipation). Dissolve 17 grams in at least 8 ounces water/juice and drink 04/04/17   Mayo, Allyn KennerKaty Dodd, MD  RABEprazole (ACIPHEX) 20 MG tablet Take 1 tablet (20 mg total) by mouth daily. 08/06/18   Mirian MoFrank, Peter, MD  ranitidine (ZANTAC 150 MAXIMUM STRENGTH) 150 MG tablet Take 1 tablet (150 mg total) by mouth 2 (two) times daily. 03/25/18   Lennox SoldersWinfrey, Amanda C, MD  spironolactone (ALDACTONE) 25 MG tablet Take 1 tablet (25 mg total) by mouth daily. 09/11/18   Mirian MoFrank, Peter, MD    Family History Family History  Problem Relation Age of Onset   Hypertension Mother    Diabetes Father    Diabetes Sister    Asthma Brother    Diabetes Brother    Kidney disease Brother    Colon cancer Neg Hx    Colon polyps Neg Hx    Esophageal cancer Neg Hx    Breast cancer Neg Hx     Social History Social History   Tobacco Use   Smoking status: Never Smoker   Smokeless tobacco: Never Used  Substance Use Topics   Alcohol use: No    Alcohol/week: 0.0 standard drinks   Drug use: No     Allergies   Percocet [oxycodone-acetaminophen] and Codeine phosphate   Review of  Systems Review of Systems  Gastrointestinal: Positive for abdominal distention and constipation.  All other systems reviewed and are negative.    Physical Exam Updated Vital Signs BP (!) 164/75    Pulse 74    Temp 98.4 F (36.9 C) (Oral)    Resp 17    SpO2 98%   Physical Exam Vitals signs and nursing note reviewed.  HENT:     Head: Normocephalic.     Right Ear: Tympanic membrane normal.     Left Ear: Tympanic membrane normal.     Nose: Nose normal.     Mouth/Throat:     Mouth: Mucous membranes are moist.  Eyes:     Extraocular Movements: Extraocular movements intact.     Pupils:  Pupils are equal, round, and reactive to light.  Neck:     Musculoskeletal: Normal range of motion.  Cardiovascular:     Rate and Rhythm: Normal rate and regular rhythm.     Pulses: Normal pulses.     Heart sounds: Normal heart sounds.  Pulmonary:     Effort: Pulmonary effort is normal.     Breath sounds: Normal breath sounds.  Abdominal:     General: Abdomen is flat.     Palpations: Abdomen is soft.     Comments: Slightly distended, mild epigastric tenderness   Genitourinary:    Comments: Rectal- no stool at vault, no obvious external hemorrhoid  Musculoskeletal: Normal range of motion.  Skin:    General: Skin is warm.     Capillary Refill: Capillary refill takes less than 2 seconds.  Neurological:     General: No focal deficit present.     Mental Status: She is alert.  Psychiatric:        Mood and Affect: Mood normal.      ED Treatments / Results  Labs (all labs ordered are listed, but only abnormal results are displayed) Labs Reviewed  CBC WITH DIFFERENTIAL/PLATELET - Abnormal; Notable for the following components:      Result Value   Hemoglobin 11.7 (*)    All other components within normal limits  COMPREHENSIVE METABOLIC PANEL - Abnormal; Notable for the following components:   Potassium 3.4 (*)    CO2 21 (*)    Albumin 3.4 (*)    All other components within normal limits   LIPASE, BLOOD  URINALYSIS, ROUTINE W REFLEX MICROSCOPIC    EKG None  Radiology Dg Abd Acute 2+v W 1v Chest  Result Date: 11/21/2018 CLINICAL DATA:  63 year old female with generalized abdominal pain for a couple of days EXAM: DG ABDOMEN ACUTE W/ 1V CHEST COMPARISON:  Prior chest x-ray 03/18/2018 FINDINGS: Mild cardiomegaly. Mediastinal contours are within normal limits. The lungs are clear. No acute osseous abnormality. No evidence of free air on the upright film. The bowel gas pattern is not obstructed. No abnormal calcifications. IMPRESSION: Negative abdominal radiographs.  No acute cardiopulmonary disease. Electronically Signed   By: Malachy Moan M.D.   On: 11/21/2018 12:13    Procedures Procedures (including critical care time)  Medications Ordered in ED Medications  famotidine (PEPCID) tablet 20 mg (20 mg Oral Given 11/21/18 1256)  alum & mag hydroxide-simeth (MAALOX/MYLANTA) 200-200-20 MG/5ML suspension 30 mL (30 mLs Oral Given 11/21/18 1256)    And  lidocaine (XYLOCAINE) 2 % viscous mouth solution 15 mL (15 mLs Oral Given 11/21/18 1256)     Initial Impression / Assessment and Plan / ED Course  I have reviewed the triage vital signs and the nursing notes.  Pertinent labs & imaging results that were available during my care of the patient were reviewed by me and considered in my medical decision making (see chart for details).       Cortny Bambach Sasaki is a 63 y.o. female here with abdominal distention, constipation. Rectal exam showed no obvious stool impaction. Will check labs, UA, xrays. Low suspicion for SBO so if xrays are negative, will hold off on CT.  1:22 PM Labs unremarkable. Xray showed constipation but no obvious SBO. Tolerated PO at home with no vomiting. Will start on miralax for constipation.    Final Clinical Impressions(s) / ED Diagnoses   Final diagnoses:  None    ED Discharge Orders    None  Charlynne Pander, MD 11/21/18 1323

## 2018-11-27 ENCOUNTER — Telehealth: Payer: Self-pay | Admitting: Family Medicine

## 2018-11-27 ENCOUNTER — Telehealth (INDEPENDENT_AMBULATORY_CARE_PROVIDER_SITE_OTHER): Payer: Medicaid Other | Admitting: Family Medicine

## 2018-11-27 DIAGNOSIS — K219 Gastro-esophageal reflux disease without esophagitis: Secondary | ICD-10-CM | POA: Diagnosis not present

## 2018-11-27 DIAGNOSIS — M7989 Other specified soft tissue disorders: Secondary | ICD-10-CM | POA: Diagnosis not present

## 2018-11-27 MED ORDER — RABEPRAZOLE SODIUM 20 MG PO TBEC: 20.0000 mg | DELAYED_RELEASE_TABLET | Freq: Every day | ORAL | 2 refills | Status: DC

## 2018-11-27 NOTE — Telephone Encounter (Signed)
Have made two attempts to call pt to discuss results.  Voicemail does not identify pt.  No message left.  Mirian Mo, MD

## 2018-11-27 NOTE — Telephone Encounter (Signed)
[facility] Center Telemedicine Visit  Patient consented to have virtual visit. Method of visit: Telephone  Encounter participants: Patient: Megan Miller - located at home Provider: Leland Her - located at [facility] Others (if applicable): none  Chief Complaint: sore stomach and R leg cramping  HPI:  Went to ED on 4/24 for constipation, took miralax which help but ever since has been having diffuse intermittent abdominal soreness. She does have heartburn and think this might be because she had been out of her aciphex recently.  No nausea or vomiting.  Is having normal bowel movements.  She has long standing R leg cramping that is intermittent. She states that she is having unilateral R foot/leg swelling that is also not new. She denies CP or SOB.  No redness or warmth    ROS: per HPI  Pertinent PMHx: GERD  Exam:  Respiratory: Speaks in full sentences, normal speech  Assessment/Plan:  GERD Likely due to medication noncompliance.  Patient's PPI was refilled today.  Right leg swelling Discussed that cramping may be due to the low potassium as seen on last labs of K3.4 on 4/24.  Advised to increase potassium intake and good p.o. hydration.  Right leg/foot swelling may be chronic but without ability to assess via over the phone and in person visit was made for patient on 5/7 as per her preference.  Discussed red flag symptoms.  Patient voiced good understanding.    Time spent during visit with patient: 9 minutes  Leland Her, DO PGY-3, [facility] 11/27/2018 11:54 AM

## 2018-11-27 NOTE — Assessment & Plan Note (Signed)
Likely due to medication noncompliance.  Patient's PPI was refilled today.

## 2018-11-27 NOTE — Assessment & Plan Note (Signed)
Discussed that cramping may be due to the low potassium as seen on last labs of K3.4 on 4/24.  Advised to increase potassium intake and good p.o. hydration.  Right leg/foot swelling may be chronic but without ability to assess via over the phone and in person visit was made for patient on 5/7 as per her preference.  Discussed red flag symptoms.  Patient voiced good understanding.

## 2018-11-27 NOTE — Telephone Encounter (Signed)
I was the preceptor.  I have reviewed and agree.

## 2018-12-01 ENCOUNTER — Telehealth: Payer: Self-pay | Admitting: Family Medicine

## 2018-12-01 DIAGNOSIS — K219 Gastro-esophageal reflux disease without esophagitis: Secondary | ICD-10-CM

## 2018-12-01 NOTE — Telephone Encounter (Signed)
Pt called to see if she could receive the name brand Aciphex instead of the generic brand, and also is she could get a refill on her ibuprofen. Please give pt a call back.

## 2018-12-02 ENCOUNTER — Other Ambulatory Visit: Payer: Self-pay | Admitting: Family Medicine

## 2018-12-02 DIAGNOSIS — K219 Gastro-esophageal reflux disease without esophagitis: Secondary | ICD-10-CM

## 2018-12-02 DIAGNOSIS — M1612 Unilateral primary osteoarthritis, left hip: Secondary | ICD-10-CM

## 2018-12-02 MED ORDER — IBUPROFEN 400 MG PO TABS: 400.0000 mg | ORAL_TABLET | Freq: Four times a day (QID) | ORAL | 0 refills | Status: DC | PRN

## 2018-12-02 MED ORDER — RABEPRAZOLE SODIUM 20 MG PO TBEC: 20.0000 mg | DELAYED_RELEASE_TABLET | Freq: Every day | ORAL | 2 refills | Status: DC

## 2018-12-02 MED ORDER — IBUPROFEN 400 MG PO TABS: 400.0000 mg | ORAL_TABLET | Freq: Four times a day (QID) | ORAL | 3 refills | Status: DC | PRN

## 2018-12-02 NOTE — Telephone Encounter (Signed)
Please let Ms. Boling know that I have sent these medications to her pharmacy.

## 2018-12-04 ENCOUNTER — Ambulatory Visit (INDEPENDENT_AMBULATORY_CARE_PROVIDER_SITE_OTHER): Payer: Medicaid Other | Admitting: Family Medicine

## 2018-12-04 ENCOUNTER — Telehealth: Payer: Self-pay

## 2018-12-04 ENCOUNTER — Other Ambulatory Visit: Payer: Self-pay

## 2018-12-04 VITALS — BP 122/68 | HR 89 | Wt 182.0 lb

## 2018-12-04 DIAGNOSIS — M79604 Pain in right leg: Secondary | ICD-10-CM

## 2018-12-04 DIAGNOSIS — K5904 Chronic idiopathic constipation: Secondary | ICD-10-CM | POA: Diagnosis not present

## 2018-12-04 DIAGNOSIS — M79605 Pain in left leg: Secondary | ICD-10-CM

## 2018-12-04 MED ORDER — GABAPENTIN 100 MG PO CAPS: 100.0000 mg | ORAL_CAPSULE | Freq: Three times a day (TID) | ORAL | 0 refills | Status: DC | PRN

## 2018-12-04 NOTE — Patient Instructions (Signed)
  Please use the gabapentin every night for relief of pain- this may make you drowsy. You can take this up to 3 times a day for the leg pain.  If the pain continues, worsens please come back for [place] to investigate more.  You will need miralax daily- mix 1 packet with 8 oz water and drink daily.  If you have questions or concerns please do not hesitate to call at [phone].  Dolores Patty, DO PGY-3, [facility] 12/04/2018 11:29 AM

## 2018-12-04 NOTE — Progress Notes (Signed)
    Subjective:    Patient ID: Megan Miller, female    DOB: 1955/10/19, 63 y.o.   MRN: [id number]   CC: leg pain  HPI: patient reports she has been having pain in both legs for about the past week. She went to the ED on 4/24 for abdominal pain caused by constipation. She was given miralax to take. She reports she has had several BM since. She reports having stomach cramping after taking the miralax. She feels that since she took the miralax she has been having leg pains. She reports the pain goes from the back of her thighs down her legs and feels like "pins and needles". She took tylenol with no relief. She tried ibuprofen which helped for a few hours then the pain returned. She has chronic low back pain. No bladder incontinence. Denies numbness or weakness in her LE, no falls.   Smoking status reviewed- non-smoker  Review of Systems- see HPI   Objective:  BP 122/68   Pulse 89   Wt 182 lb (82.6 kg)   SpO2 97%   BMI 34.39 kg/m  Vitals and nursing note reviewed  General: well nourished, in no acute distress HEENT: normocephalic, MMM Cardiac: RRR, clear S1 and S2, no murmurs, rubs, or gallops Respiratory: no increased work of breathing Extremities: no edema or cyanosis. Skin: warm and dry, no rashes noted Back: tender to palpation of lumbar musculature, no midline lumbar tenderness Neuro: alert and oriented, no focal deficits. Strength 5/5 in upper and lower extremities bilaterally, symmetrical reflexes at L4 and S1 bilaterally   Assessment & Plan:   1. Pain in both lower extremities Pain sounds consistent with nerve pain, from review of imaging she had a Lumbar MRI in 10/2017 demonstrating L4-L5 disc extrusion w/ L4 nerve impingement. She had normal EMG of lower extremities in 11/2017. No loss of sensation, reflexes in tact, strength symmetrical bilaterally on exam today. Unchanged chronic low back pain. Rx for gabapentin 100 mg given to use TID as needed. Discussed if this  persists or worsens would have her follow up for more investigation, patient is agreeable- she had been referred to neurosurgery in the past but unclear if she went. Would refer her back there.   2. Chronic idiopathic constipation Patient with BM every 3 days on average. Told her to take miralax daily with goal of soft BM daily.    Return in about 4 weeks (around 01/01/2019), or if symptoms worsen or fail to improve.   Dolores Patty, DO Family Medicine Resident PGY-3

## 2018-12-04 NOTE — Telephone Encounter (Signed)
Pt called nurse line stating she is unable to pick up her Aciphex prescription, she is unsure why. I called the pharmacy and the pharmacist stated since she has medicaid and she needs name brand, a hard copy needs to be faxed to the pharmacy. Will have an attending due this tomorrow, as pcp is on nights.

## 2018-12-10 NOTE — Telephone Encounter (Signed)
Called pt to relay results of hip Xray.  Voicemail not set up for confidential messages.  Mirian Mo, MD

## 2018-12-11 ENCOUNTER — Telehealth: Payer: Self-pay | Admitting: Family Medicine

## 2018-12-11 NOTE — Telephone Encounter (Signed)
Pt is calling and would like to know if she could have a new medication for her leg pain. She is still in pain and aching. Please call pt with any questions at [phone].

## 2018-12-16 ENCOUNTER — Other Ambulatory Visit: Payer: Self-pay

## 2018-12-16 ENCOUNTER — Encounter: Payer: Self-pay | Admitting: Family Medicine

## 2018-12-16 ENCOUNTER — Ambulatory Visit (INDEPENDENT_AMBULATORY_CARE_PROVIDER_SITE_OTHER): Payer: Medicaid Other | Admitting: Family Medicine

## 2018-12-16 DIAGNOSIS — M25552 Pain in left hip: Secondary | ICD-10-CM

## 2018-12-16 DIAGNOSIS — K219 Gastro-esophageal reflux disease without esophagitis: Secondary | ICD-10-CM

## 2018-12-16 DIAGNOSIS — M1711 Unilateral primary osteoarthritis, right knee: Secondary | ICD-10-CM

## 2018-12-16 DIAGNOSIS — M5417 Radiculopathy, lumbosacral region: Secondary | ICD-10-CM | POA: Diagnosis not present

## 2018-12-16 MED ORDER — RABEPRAZOLE SODIUM 20 MG PO TBEC: 20.0000 mg | DELAYED_RELEASE_TABLET | Freq: Every day | ORAL | 2 refills | Status: DC

## 2018-12-16 MED ORDER — ACETAMINOPHEN ER 650 MG PO TBCR: 650.0000 mg | EXTENDED_RELEASE_TABLET | Freq: Four times a day (QID) | ORAL | 0 refills | Status: DC | PRN

## 2018-12-16 NOTE — Patient Instructions (Signed)
I suspect that the right leg pain you are experiencing today is progression of urine known back issues.  You previous back imaging showed [place] that you have something called lumbar stenosis in addition to foraminal narrowing.  These are both issues that can cause significant back and leg pain.  In addition to continuing your current pain regimen, going to reach out again to neurosurgery and physical therapy to help with pain management.  This is not a reversible problem.  It is important that we focus on managing and reducing her symptoms as much as possible.  Your pain medication includes the following:  Tylenol 650 mg-you can take this is often as 4 times a day  Voltaren gel-this is a topical pain reliever that will be most helpful for her joints you can apply as often as 4 times a day  Gabapentin (Neurontin) I want you to be more diligent in taking this medication.  Start by taking it at night and if that goes well increase to taking it 3 times daily (morning, afternoon, evening) this is something that may help with your sharp/shooting pain.  Advil-I recommend taking Advil only when your pain is worse than usual.  This medication does carry some risk because of your medical history and so you should limit its use though it can still be useful.

## 2018-12-16 NOTE — Progress Notes (Signed)
    Subjective:  ROSARIA THOMSEN is a 63 y.o. female who presents to the [facility] today with a chief complaint of left leg pain.   HPI: Bilateral leg pain Ms. Spake reports that she has been experiencing increased leg pain for about 2 weeks.  She reports that this began happening about 1 week after starting MiraLAX.  She reports that her pain is been 8-9/10 primarily on the right side.  The worst pain generally starts in her right hip and moves down toward her feet.  It is worse with ambulation.  She reports 2 episodes of falls in the past month.  Both of these falls resulted in her landing on her left knee and hip.  Neither fall resulted in head trauma.  She wanted to know if there is anything further that could be done about her lower extremity pain.  After reviewing her current medication she reports that she is not currently taking gabapentin that other medication she is not taking as frequently as possible.  Chief Complaint noted Review of Symptoms - see HPI PMH -lumbar stenosis, foraminal narrowing of L3 and L4  Objective:  Physical Exam: BP 122/70   Pulse 75   SpO2 98%    Gen: NAD, resting comfortably.  Able to ambulate in the room from the chair to the table. MSK: no edema, cyanosis, or clubbing noted.  Negative straight leg raise bilaterally.  Minor discomfort with external rotation of the hip on the right side.  No significant pain with internal or external rotation of the left hip.  Mild tenderness to palpation of the left knee.  No paraspinal tenderness.  No significant tenderness with palpation of the spine.  Mild tenderness to palpation of the left SI joint.  No tenderness with palpation of the right SI joint.  No results found for this or any previous visit (from the past 72 hour(s)).   Assessment/Plan:  Lumbosacral radiculopathy I suspect this is likely progression of her known lumbar stenosis with foraminal narrowing along L3 and L4.  We reviewed her current pain  medications we will not be making any additions although I reinforced the following: -Tylenol 650 every 6 hours as needed for pain -Voltaren gel apply topically 4 times daily for pain -Gabapentin 100 mg 3 times daily as needed (she had not been taking the gabapentin was encouraged to start taking it at night to increase to 3 times daily if he does not experience significant drowsiness) -Ibuprofen 400 mg she may use as often as 4 times daily (she was told to only use this for significantly increased pain as frequent use of this medication is contraindicated due to her previous CVA) -Follow-up regarding referral to neurosurgery -Follow-up referral to physical therapy

## 2018-12-16 NOTE — Assessment & Plan Note (Signed)
I suspect this is likely progression of her known lumbar stenosis with foraminal narrowing along L3 and L4.  We reviewed her current pain medications we will not be making any additions although I reinforced the following: -Tylenol 650 every 6 hours as needed for pain -Voltaren gel apply topically 4 times daily for pain -Gabapentin 100 mg 3 times daily as needed (she had not been taking the gabapentin was encouraged to start taking it at night to increase to 3 times daily if he does not experience significant drowsiness) -Ibuprofen 400 mg she may use as often as 4 times daily (she was told to only use this for significantly increased pain as frequent use of this medication is contraindicated due to her previous CVA) -Follow-up regarding referral to neurosurgery -Follow-up referral to physical therapy

## 2018-12-18 ENCOUNTER — Telehealth: Payer: Self-pay | Admitting: *Deleted

## 2018-12-18 DIAGNOSIS — K219 Gastro-esophageal reflux disease without esophagitis: Secondary | ICD-10-CM

## 2018-12-18 MED ORDER — RABEPRAZOLE SODIUM 20 MG PO TBEC: 20.0000 mg | DELAYED_RELEASE_TABLET | Freq: Every day | ORAL | 2 refills | Status: DC

## 2018-12-18 NOTE — Telephone Encounter (Signed)
Pt requires Name brand aciphex (PA received from [facility]) but will need script to say Brand name medically neccessary.   Will resend now, verbal from Dr. Homero Fellers. Jone Baseman, CMA

## 2018-12-23 ENCOUNTER — Telehealth: Payer: Self-pay | Admitting: *Deleted

## 2018-12-23 NOTE — Telephone Encounter (Signed)
Pt states that Dr. Homero Fellers told her he would call about a referral and who he was going to send her to.   Will forward to MD. Jone Baseman, CMA

## 2018-12-24 ENCOUNTER — Other Ambulatory Visit: Payer: Self-pay | Admitting: Family Medicine

## 2018-12-24 DIAGNOSIS — M48062 Spinal stenosis, lumbar region with neurogenic claudication: Secondary | ICD-10-CM

## 2018-12-24 DIAGNOSIS — M1711 Unilateral primary osteoarthritis, right knee: Secondary | ICD-10-CM

## 2018-12-24 DIAGNOSIS — M5417 Radiculopathy, lumbosacral region: Secondary | ICD-10-CM

## 2018-12-24 NOTE — Telephone Encounter (Signed)
She was told that I would follow up with her previously placed referrals to physical therapy and neurosurgery.  She previously declined one of these.  I have put in new referrals for each of these services.  Please let her know that she should call the clinic if she has not received a call in the next week.  Mirian Mo, MD

## 2018-12-24 NOTE — Telephone Encounter (Signed)
LM for patient that referrals have been placed. Megan Miller,CMA

## 2019-01-02 ENCOUNTER — Emergency Department (HOSPITAL_COMMUNITY)
Admission: EM | Admit: 2019-01-02 | Discharge: 2019-01-02 | Disposition: A | Discharge status: Home / Self Care | Payer: Medicaid Other | Attending: Emergency Medicine | Admitting: Emergency Medicine

## 2019-01-02 ENCOUNTER — Encounter (HOSPITAL_COMMUNITY): Payer: Self-pay | Admitting: *Deleted

## 2019-01-02 ENCOUNTER — Other Ambulatory Visit: Payer: Self-pay

## 2019-01-02 DIAGNOSIS — M5432 Sciatica, left side: Secondary | ICD-10-CM | POA: Insufficient documentation

## 2019-01-02 DIAGNOSIS — I10 Essential (primary) hypertension: Secondary | ICD-10-CM | POA: Insufficient documentation

## 2019-01-02 DIAGNOSIS — Z8673 Personal history of transient ischemic attack (TIA), and cerebral infarction without residual deficits: Secondary | ICD-10-CM | POA: Diagnosis not present

## 2019-01-02 DIAGNOSIS — Z79899 Other long term (current) drug therapy: Secondary | ICD-10-CM | POA: Diagnosis not present

## 2019-01-02 DIAGNOSIS — M545 Low back pain: Secondary | ICD-10-CM | POA: Diagnosis present

## 2019-01-02 MED ORDER — MELOXICAM 15 MG PO TABS: 15.0000 mg | ORAL_TABLET | Freq: Every day | ORAL | 0 refills | Status: DC

## 2019-01-02 MED ORDER — TRAMADOL HCL 50 MG PO TABS: 50.0000 mg | ORAL_TABLET | Freq: Four times a day (QID) | ORAL | 0 refills | Status: DC | PRN

## 2019-01-02 MED ORDER — METHYLPREDNISOLONE 4 MG PO TBPK: ORAL_TABLET | ORAL | 0 refills | Status: DC

## 2019-01-02 NOTE — Discharge Instructions (Addendum)
SEEK IMMEDIATE MEDICAL ATTENTION IF: New numbness, tingling, weakness, or problem with the use of your arms or legs.  Severe back pain not relieved with medications.  Change in bowel or bladder control.  Increasing pain in any areas of the body (such as chest or abdominal pain).  Shortness of breath, dizziness or fainting.  Nausea (feeling sick to your stomach), vomiting, fever, or sweats.  

## 2019-01-02 NOTE — ED Provider Notes (Signed)
[facility] Provider Note   CSN: [id number] Arrival date & time: 01/02/19  1321    History   Chief Complaint Chief Complaint  Patient presents with  . Back Pain    HPI :  Megan Miller is a pleasat 63 y.o. female who complains of low back pain for 3 week(s), positional with bending or lifting, with radiation down the legs. Precipitating factors: none recalled by the patient. Prior history of back problems: no prior back problems. There is no numbness in the legs.She does c/o pain radiating down the left leg in the sciatic distrubution. Pain is worse with sitting for long periods or standing for long periods . She was treated with "some pills that did not work," by her pcp for the same problem. Shedenies saddle anesthesia, urinary or bowel incontinence, fevers, dysuria or other sxs of uti.      HPI  Past Medical History:  Diagnosis Date  . Allergic conjunctivitis   . Chest pain   . Chronic back pain   . Constipation, chronic   . Dyspnea on exertion   . Dysthymic   . GERD (gastroesophageal reflux disease)   . Hypertension   . Leg edema    Asymetric Right leg (dopplers negative)  . Overweight (BMI 25.0-29.9)   . Stroke (HCC)   . Urination frequency     Patient Active Problem List   Diagnosis Date Noted  . Primary osteoarthritis of right knee 07/09/2018  . Pain of left hip joint 07/09/2018  . Lightheadedness 04/23/2018  . Gait instability 01/08/2018  . Chronic migraine without aura without status migrainosus, not intractable 11/13/2017  . Tension headache 11/13/2017  . Lumbosacral radiculopathy 11/12/2017  . Cerebrovascular accident (CVA) (HCC) 11/08/2017  . Spinal stenosis of lumbar region 11/08/2017  . Syncope 11/15/2015  . Edema of right foot 01/27/2015  . Intertrigo 12/30/2014  . Stable angina (HCC) 09/23/2014  . Hot flashes 11/27/2013  . Hair loss 09/10/2013  . Depression 03/18/2013  . 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  OVERWEIGHT 05/05/2009  . CONSTIPATION, CHRONIC 02/10/2009  . DYSTHYMIC DISORDER 02/06/2008  . GERD 02/06/2008  . HYPERTENSION, BENIGN 10/09/2006  . Chronic low back pain 10/09/2006    Past Surgical History:  Procedure Laterality Date  . 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 BILATERAL SALPINGOOPHORECTOMY  05/08/2001   Path=extensive endometriosis  . BREAST SURGERY  12/29/2003   Right breast core biopsy=benign fibrocystic change  . SMALL INTESTINE SURGERY  03/30/2001   Rectosigmoid bowel resection and reanastomosis     OB History   No obstetric history on file.      Home Medications    Prior to Admission medications   Medication Sig Start Date End Date Taking? Authorizing Provider  acetaminophen (TYLENOL 8 HOUR) 650 MG CR tablet Take 1 tablet (650 mg total) by mouth every 6 (six) hours as needed for pain. 12/16/18   Mirian MoFrank, Peter, MD  aspirin 81 MG tablet Take 1 tablet (81 mg total) by mouth daily. 09/23/14   Glori LuisSonnenberg, Eric G, MD  atorvastatin (LIPITOR) 40 MG tablet Take 1 tablet (40 mg total) by mouth daily. 11/14/18   Mirian MoFrank, Peter, MD  baclofen (LIORESAL) 10 MG tablet TAKE 0.5 TABLETS (5 MG TOTAL) BY MOUTH 2 (TWO) TIMES DAILY. 09/06/18   Mirian MoFrank, Peter, MD  carvedilol (COREG) 3.125 MG tablet Take 1 tablet (3.125 mg total) by mouth 2 (two) times daily with a meal. 05/20/18 11/16/18  Mirian MoFrank, Peter, MD  desonide (  DESOWEN) 0.05 % cream Apply topically 2 (two) times daily. Patient taking differently: Apply 1 application topically daily. For itching 04/08/13   Hairford, Ricki Miller, MD  diclofenac sodium (VOLTAREN) 1 % GEL Apply 2 g topically 4 (four) times daily. 08/06/18   Mirian Mo, MD  gabapentin (NEURONTIN) 100 MG capsule Take 1 capsule (100 mg total) by mouth 3 (three) times daily as needed. 12/04/18   Tillman Sers, DO  ibuprofen (ADVIL) 400 MG tablet Take 1 tablet (400 mg total) by mouth every 6 (six) hours as needed. 12/02/18   Mirian Mo, MD  ipratropium (ATROVENT)  0.06 % nasal spray Place 2 sprays into both nostrils 4 (four) times daily. Patient taking differently: Place 2 sprays into both nostrils 4 (four) times daily as needed (seasonal allergies).  09/26/16 03/18/18  Bonney Aid, MD  meloxicam (MOBIC) 15 MG tablet Take 1 tablet (15 mg total) by mouth daily. 01/02/19   Arthor Captain, PA-C  methylPREDNISolone (MEDROL DOSEPAK) 4 MG TBPK tablet Use as directed 01/02/19   Arthor Captain, PA-C  minoxidil (ROGAINE) 2 % external solution Apply topically 2 (two) times daily. 09/26/18   Mirian Mo, MD  nitroGLYCERIN (NITRODUR - DOSED IN MG/24 HR) 0.2 mg/hr patch APPLY 1/4 PATCH TO AFFECTED AREA. CHANGE PATCH EVERY 24 HOURS. Patient taking differently: Place 0.2 mg onto the skin daily.     Hairford, Ricki Miller, MD  nystatin cream (MYCOSTATIN) Apply 1 application topically 2 (two) times daily. 02/13/18   Mirian Mo, MD  olopatadine (PATANOL) 0.1 % ophthalmic solution Place 1 drop into both eyes 2 (two) times daily as needed (seasonal allergies/itching).     [provider]  ondansetron (ZOFRAN) 8 MG tablet Take 1 tablet (8 mg total) by mouth every 8 (eight) hours as needed for nausea or vomiting. 03/25/18   Winfrey, Harlen Labs, MD  polyethylene glycol (MIRALAX / GLYCOLAX) 17 g packet Take 17 g by mouth 2 (two) times daily. 11/21/18   Charlynne Pander, MD  RABEprazole (ACIPHEX) 20 MG tablet Take 1 tablet (20 mg total) by mouth daily. Brand Name Medically Neccessary. 12/18/18   Mirian Mo, MD  ranitidine (ZANTAC 150 MAXIMUM STRENGTH) 150 MG tablet Take 1 tablet (150 mg total) by mouth 2 (two) times daily. 03/25/18   Lennox Solders, MD  spironolactone (ALDACTONE) 25 MG tablet Take 1 tablet (25 mg total) by mouth daily. 09/11/18   Mirian Mo, MD  traMADol (ULTRAM) 50 MG tablet Take 1 tablet (50 mg total) by mouth every 6 (six) hours as needed. 01/02/19   Arthor Captain, PA-C    Family History Family History  Problem Relation Age of Onset  . Hypertension  Mother   . Diabetes Father   . Diabetes Sister   . Asthma Brother   . Diabetes Brother   . Kidney disease Brother   . Colon cancer Neg Hx   . Colon polyps Neg Hx   . Esophageal cancer Neg Hx   . Breast cancer Neg Hx     Social History Social History   Tobacco Use  . Smoking status: Never Smoker  . Smokeless tobacco: Never Used  Substance Use Topics  . Alcohol use: No    Alcohol/week: 0.0 standard drinks  . Drug use: No     Allergies   Percocet [oxycodone-acetaminophen] and Codeine phosphate   Review of Systems Review of Systems Ten systems reviewed and are negative for acute change, except as noted in the HPI.   Physical Exam Updated  Vital Signs BP (!) 162/78   Pulse 90   Temp 98.5 F (36.9 C) (Oral)   Resp 16   SpO2 99%   Physical Exam Vitals signs and nursing note reviewed.  Constitutional:      General: She is not in acute distress.    Appearance: She is well-developed. She is not diaphoretic.  HENT:     Head: Normocephalic and atraumatic.  Eyes:     General: No scleral icterus.    Conjunctiva/sclera: Conjunctivae normal.  Neck:     Musculoskeletal: Normal range of motion.  Cardiovascular:     Rate and Rhythm: Normal rate and regular rhythm.     Heart sounds: Normal heart sounds. No murmur. No friction rub. No gallop.   Pulmonary:     Effort: Pulmonary effort is normal. No respiratory distress.     Breath sounds: Normal breath sounds.  Abdominal:     General: Bowel sounds are normal. There is no distension.     Palpations: Abdomen is soft. There is no mass.     Tenderness: There is no abdominal tenderness. There is no guarding.  Musculoskeletal:     Comments: Lumbosacral spine area reveals no tenderness and no spasm.  Painful and reduced LS ROM noted. Straight leg raise is positive at 30 degrees on left. DTR's, motor strength and sensation normal, including heel and toe gait.  Peripheral pulses are palpable. Hips and knees have full range of motion  without pain. No abdominal tenderness, mass or organomegaly.   Skin:    General: Skin is warm and dry.  Neurological:     Mental Status: She is alert and oriented to person, place, and time.  Psychiatric:        Behavior: Behavior normal.      ED Treatments / Results  Labs (all labs ordered are listed, but only abnormal results are displayed) Labs Reviewed - No data to display  EKG None  Radiology No results found.  Procedures Procedures (including critical care time)  Medications Ordered in ED Medications - No data to display   Initial Impression / Assessment and Plan / ED Course  I have reviewed the triage vital signs and the nursing notes.  Pertinent labs & imaging results that were available during my care of the patient were reviewed by me and considered in my medical decision making (see chart for details).        Patient with back pain.  No neurological deficits and normal neuro exam.  Patient can walk but states is painful.  No loss of bowel or bladder control.  No concern for cauda equina.  No fever, night sweats, weight loss, h/o cancer, IVDU.  RICE protocol and pain medicine indicated and discussed with patient.   Allergies as of 01/02/2019      Reactions   Percocet [oxycodone-acetaminophen] Nausea And Vomiting   Codeine Phosphate Itching      Medication List    TAKE these medications   meloxicam 15 MG tablet Commonly known as:  Mobic Take 1 tablet (15 mg total) by mouth daily.   methylPREDNISolone 4 MG Tbpk tablet Commonly known as:  MEDROL DOSEPAK Use as directed   traMADol 50 MG tablet Commonly known as:  ULTRAM Take 1 tablet (50 mg total) by mouth every 6 (six) hours as needed.     ASK your doctor about these medications   acetaminophen 650 MG CR tablet Commonly known as:  Tylenol 8 Hour Take 1 tablet (650 mg total) by mouth  every 6 (six) hours as needed for pain.   aspirin 81 MG tablet Take 1 tablet (81 mg total) by mouth daily.    atorvastatin 40 MG tablet Commonly known as:  LIPITOR Take 1 tablet (40 mg total) by mouth daily.   baclofen 10 MG tablet Commonly known as:  LIORESAL TAKE 0.5 TABLETS (5 MG TOTAL) BY MOUTH 2 (TWO) TIMES DAILY.   carvedilol 3.125 MG tablet Commonly known as:  COREG Take 1 tablet (3.125 mg total) by mouth 2 (two) times daily with a meal.   desonide 0.05 % cream Commonly known as:  DesOwen Apply topically 2 (two) times daily.   diclofenac sodium 1 % Gel Commonly known as:  VOLTAREN Apply 2 g topically 4 (four) times daily.   gabapentin 100 MG capsule Commonly known as:  NEURONTIN Take 1 capsule (100 mg total) by mouth 3 (three) times daily as needed.   ibuprofen 400 MG tablet Commonly known as:  ADVIL Take 1 tablet (400 mg total) by mouth every 6 (six) hours as needed.   ipratropium 0.06 % nasal spray Commonly known as:  Atrovent Place 2 sprays into both nostrils 4 (four) times daily.   minoxidil 2 % external solution Commonly known as:  ROGAINE Apply topically 2 (two) times daily.   nitroGLYCERIN 0.2 mg/hr patch Commonly known as:  NITRODUR - Dosed in mg/24 hr APPLY 1/4 PATCH TO AFFECTED AREA. CHANGE PATCH EVERY 24 HOURS.   nystatin cream Commonly known as:  MYCOSTATIN Apply 1 application topically 2 (two) times daily.   olopatadine 0.1 % ophthalmic solution Commonly known as:  PATANOL Place 1 drop into both eyes 2 (two) times daily as needed (seasonal allergies/itching).   ondansetron 8 MG tablet Commonly known as:  ZOFRAN Take 1 tablet (8 mg total) by mouth every 8 (eight) hours as needed for nausea or vomiting.   polyethylene glycol 17 g packet Commonly known as:  MIRALAX / GLYCOLAX Take 17 g by mouth 2 (two) times daily.   RABEprazole 20 MG tablet Commonly known as:  Aciphex Take 1 tablet (20 mg total) by mouth daily. Brand Name Medically Neccessary.   ranitidine 150 MG tablet Commonly known as:  Zantac 150 Maximum Strength Take 1 tablet (150 mg  total) by mouth 2 (two) times daily.   spironolactone 25 MG tablet Commonly known as:  ALDACTONE Take 1 tablet (25 mg total) by mouth daily.        Final Clinical Impressions(s) / ED Diagnoses   Final diagnoses:  Sciatica of left side    ED Discharge Orders         Ordered    methylPREDNISolone (MEDROL DOSEPAK) 4 MG TBPK tablet     01/02/19 1406    meloxicam (MOBIC) 15 MG tablet  Daily     01/02/19 1406    traMADol (ULTRAM) 50 MG tablet  Every 6 hours PRN     01/02/19 1406           Arthor Captain, PA-C 01/05/19 1115    Sabas Sous, MD 01/07/19 [phone]

## 2019-01-02 NOTE — ED Triage Notes (Signed)
Pt in c/o left lower back pain that radiates down her legs, states its been going on for awhile, went to her PCP and he prescribed her some medication but it did not help, worse with movement and position change, no distress noted, ambulatory

## 2019-01-16 ENCOUNTER — Encounter: Payer: Self-pay | Admitting: Physical Therapy

## 2019-01-16 ENCOUNTER — Ambulatory Visit (INDEPENDENT_AMBULATORY_CARE_PROVIDER_SITE_OTHER): Payer: Medicaid Other | Admitting: Student in an Organized Health Care Education/Training Program

## 2019-01-16 ENCOUNTER — Ambulatory Visit: Payer: Medicaid Other | Attending: Family Medicine | Admitting: Physical Therapy

## 2019-01-16 ENCOUNTER — Encounter: Payer: Self-pay | Admitting: Student in an Organized Health Care Education/Training Program

## 2019-01-16 ENCOUNTER — Other Ambulatory Visit: Payer: Self-pay

## 2019-01-16 VITALS — BP 142/76 | HR 80

## 2019-01-16 DIAGNOSIS — R300 Dysuria: Secondary | ICD-10-CM | POA: Diagnosis present

## 2019-01-16 DIAGNOSIS — M6281 Muscle weakness (generalized): Secondary | ICD-10-CM | POA: Insufficient documentation

## 2019-01-16 DIAGNOSIS — R293 Abnormal posture: Secondary | ICD-10-CM | POA: Insufficient documentation

## 2019-01-16 DIAGNOSIS — R29898 Other symptoms and signs involving the musculoskeletal system: Secondary | ICD-10-CM | POA: Insufficient documentation

## 2019-01-16 DIAGNOSIS — R350 Frequency of micturition: Secondary | ICD-10-CM | POA: Diagnosis not present

## 2019-01-16 LAB — GLUCOSE, POCT (MANUAL RESULT ENTRY): POC Glucose: 117 mg/dl — AB (ref 70–99)

## 2019-01-16 MED ORDER — CEPHALEXIN 500 MG PO CAPS: 500.0000 mg | ORAL_CAPSULE | Freq: Two times a day (BID) | ORAL | 0 refills | Status: DC

## 2019-01-16 NOTE — Patient Instructions (Signed)
Access Code: [email or web address]  URL: [email or web address]  Date: 01/16/2019  Prepared by: Jeral Pinch   Exercises  Prone Gluteal Sets - 10 reps - 5 hold - 2x daily  Prone Scapular Retraction - 10 reps - 1-2 hold - 2x daily  Bridge - 10 reps - 2x daily  Supine Figure 4 Piriformis Stretch - 2 reps - 30 hold

## 2019-01-16 NOTE — Patient Instructions (Signed)
It was a pleasure seeing you today in our clinic. Here is the treatment plan we have discussed and agreed upon together:  We ran additional tests on your urine at today's visit. I will call or send you a letter with these results if you need a change in therapy.   Our clinic's number is [phone]. Please call with questions or concerns about what we discussed today.  Be well, Dr. Burr Medico

## 2019-01-16 NOTE — Progress Notes (Signed)
   CC: dysuria and urinary frequency  HPI: Megan Miller is a 62 y.o. female   DYSURIA Patient has had 5 days of worsening urinary frequency and tingling when she urinates. She has had a UTI in the past and feels this is similar. SHe has had no fever, vaginal discharge or mouth ulcers. She denies hematuria. No urinary urgency. She reports that she has not had antibiotics in the past month. She does not have a history of diabetes.  Review of Symptoms - see HPI PMH - Smoking status noted.    No obstetric history on file.  Review of Symptoms:  See HPI for ROS.   CC, SH/smoking status, and VS noted.  Objective: BP (!) 142/76   Pulse 80   SpO2 98%  GEN: NAD, alert, cooperative, and pleasant. RESPIRATORY: Comfortable work of breathing, speaks in full sentences CV: Regular rate noted, distal extremities well perfused and warm without edema GI: Soft, non-distended GU: No CVA tenderness SKIN: warm and dry, no rashes or lesions NEURO: II-XII grossly intact MSK: Moves 4 extremities equally PSYCH: AAOx3, appropriate affect  Assessment and plan:  Dysuria Associated with urinary frequency. UA is not indicative of UTI.  WIll send urine for culture. Patient would like empiric antibiotic coverage for a UTI. Discussed risks of over-treating with abx. CBG not suggestive of hyperglycemia as cause for urinary frequency. Her HPI is consistent with UTI. - empiric treatment for UTI - send urine for culture - cephALEXin (KEFLEX) 500 MG capsule; Take 1 capsule (500 mg total) by mouth 2 (two) times daily.  Dispense: 14 capsule; Refill: 0 -return if symptoms fail to improve - return precautions discussed  Orders Placed This Encounter  Procedures  . Urine Culture  . POCT urinalysis dipstick  . Glucose (CBG)   Meds ordered this encounter  Medications  . cephALEXin (KEFLEX) 500 MG capsule    Sig: Take 1 capsule (500 mg total) by mouth 2 (two) times daily.    Dispense:  14 capsule   Refill:  0   Everrett Coombe, MD,MS,  PGY3 01/19/2019 1:43 PM

## 2019-01-16 NOTE — Therapy (Signed)
[facility] [address] [place], [place], [address] Phone: [phone]   Fax:  [phone]  Physical Therapy Evaluation  Patient Details  Name: Megan Miller MRN: [id number] Date of Birth: [birth year] Referring Provider (PT): Dr Janit PaganKehinde Eniola   Encounter Date: 01/16/2019  PT End of Session - 01/16/19 1114    Visit Number  1    Number of Visits  4    Date for PT Re-Evaluation  02/20/19    Authorization Type  MCD requesting 3 more visits through 02/20/2019    PT Start Time  1115    PT Stop Time  1150    PT Time Calculation (min)  35 min    Activity Tolerance  Patient tolerated treatment well       Past Medical History:  Diagnosis Date  . Allergic conjunctivitis   . Chest pain   . Chronic back pain   . Constipation, chronic   . Dyspnea on exertion   . Dysthymic   . GERD (gastroesophageal reflux disease)   . Hypertension   . Leg edema    Asymetric Right leg (dopplers negative)  . Overweight (BMI 25.0-29.9)   . Stroke (HCC)   . Urination frequency     Past Surgical History:  Procedure Laterality Date  . 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 BILATERAL SALPINGOOPHORECTOMY  05/08/2001   Path=extensive endometriosis  . BREAST SURGERY  12/29/2003   Right breast core biopsy=benign fibrocystic change  . SMALL INTESTINE SURGERY  03/30/2001   Rectosigmoid bowel resection and reanastomosis    There were no vitals filed for this visit.   Subjective Assessment - 01/16/19 1115    Subjective  Pt reports she has bilat lower leg pain that is contant and intermittent back pain.  she has had these symptoms for about 3 months.    Pertinent History  CVA last April - did not have therapy and had full resolution of symptoms.    How long can you sit comfortably?  no limitations    How long can you walk comfortably?  tolerates > 5'    Diagnostic tests  none    Patient Stated Goals  walk better,    Currently in  Pain?  Yes    Pain Score  8     Pain Location  Leg    Pain Orientation  Left;Right;Lower    Pain Descriptors / Indicators  Tightness    Pain Type  Acute pain    Pain Onset  More than a month ago    Pain Frequency  Constant    Aggravating Factors   standing and walking    Pain Relieving Factors  sitting         [facility] PT Assessment - 01/16/19 0001      Assessment   Medical Diagnosis  Lumbosacral radiculopathy and Rt knee OA    Referring Provider (PT)  Dr Janit PaganKehinde Eniola    Onset Date/Surgical Date  10/16/18    Hand Dominance  Right    Next MD Visit  neurologist going to be doing more testing    Prior Therapy  none      Precautions   Precautions  None      Balance Screen   Has the patient fallen in the past 6 months  No    Has the patient had a decrease in activity level because of a fear of falling?   No    Is the patient reluctant to  hip joint 07/09/2018  . Lightheadedness 04/23/2018  . Gait instability 01/08/2018  . Chronic migraine without aura without status migrainosus, not intractable 11/13/2017  . Tension headache 11/13/2017  . Lumbosacral radiculopathy 11/12/2017  . Cerebrovascular accident (CVA) (HCC) 11/08/2017  . Spinal stenosis of lumbar region 11/08/2017  . Syncope 11/15/2015  . Edema of right foot 01/27/2015  . Intertrigo 12/30/2014  . Stable angina (HCC) 09/23/2014  . Hot flashes 11/27/2013  . Hair loss 09/10/2013  . Depression 03/18/2013  . 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 OVERWEIGHT 05/05/2009  . CONSTIPATION, CHRONIC 02/10/2009  . DYSTHYMIC DISORDER 02/06/2008  . GERD 02/06/2008  . HYPERTENSION, BENIGN 10/09/2006  . Chronic low back pain 10/09/2006    Darl PikesSusan Maryrose Colvin PT  01/16/2019, 12:08 PM  [facility] [address] [place], [place], [address] Phone: [phone]   Fax:  [phone]  Name: Megan Miller MRN: [id number] Date of Birth: 1955-12-30  [facility] [address] [place], [place], [address] Phone: [phone]   Fax:  [phone]  Physical Therapy Evaluation  Patient Details  Name: Megan Miller MRN: [id number] Date of Birth: [birth year] Referring Provider (PT): Dr Janit PaganKehinde Eniola   Encounter Date: 01/16/2019  PT End of Session - 01/16/19 1114    Visit Number  1    Number of Visits  4    Date for PT Re-Evaluation  02/20/19    Authorization Type  MCD requesting 3 more visits through 02/20/2019    PT Start Time  1115    PT Stop Time  1150    PT Time Calculation (min)  35 min    Activity Tolerance  Patient tolerated treatment well       Past Medical History:  Diagnosis Date  . Allergic conjunctivitis   . Chest pain   . Chronic back pain   . Constipation, chronic   . Dyspnea on exertion   . Dysthymic   . GERD (gastroesophageal reflux disease)   . Hypertension   . Leg edema    Asymetric Right leg (dopplers negative)  . Overweight (BMI 25.0-29.9)   . Stroke (HCC)   . Urination frequency     Past Surgical History:  Procedure Laterality Date  . 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 BILATERAL SALPINGOOPHORECTOMY  05/08/2001   Path=extensive endometriosis  . BREAST SURGERY  12/29/2003   Right breast core biopsy=benign fibrocystic change  . SMALL INTESTINE SURGERY  03/30/2001   Rectosigmoid bowel resection and reanastomosis    There were no vitals filed for this visit.   Subjective Assessment - 01/16/19 1115    Subjective  Pt reports she has bilat lower leg pain that is contant and intermittent back pain.  she has had these symptoms for about 3 months.    Pertinent History  CVA last April - did not have therapy and had full resolution of symptoms.    How long can you sit comfortably?  no limitations    How long can you walk comfortably?  tolerates > 5'    Diagnostic tests  none    Patient Stated Goals  walk better,    Currently in  Pain?  Yes    Pain Score  8     Pain Location  Leg    Pain Orientation  Left;Right;Lower    Pain Descriptors / Indicators  Tightness    Pain Type  Acute pain    Pain Onset  More than a month ago    Pain Frequency  Constant    Aggravating Factors   standing and walking    Pain Relieving Factors  sitting         [facility] PT Assessment - 01/16/19 0001      Assessment   Medical Diagnosis  Lumbosacral radiculopathy and Rt knee OA    Referring Provider (PT)  Dr Janit PaganKehinde Eniola    Onset Date/Surgical Date  10/16/18    Hand Dominance  Right    Next MD Visit  neurologist going to be doing more testing    Prior Therapy  none      Precautions   Precautions  None      Balance Screen   Has the patient fallen in the past 6 months  No    Has the patient had a decrease in activity level because of a fear of falling?   No    Is the patient reluctant to  hip joint 07/09/2018  . Lightheadedness 04/23/2018  . Gait instability 01/08/2018  . Chronic migraine without aura without status migrainosus, not intractable 11/13/2017  . Tension headache 11/13/2017  . Lumbosacral radiculopathy 11/12/2017  . Cerebrovascular accident (CVA) (HCC) 11/08/2017  . Spinal stenosis of lumbar region 11/08/2017  . Syncope 11/15/2015  . Edema of right foot 01/27/2015  . Intertrigo 12/30/2014  . Stable angina (HCC) 09/23/2014  . Hot flashes 11/27/2013  . Hair loss 09/10/2013  . Depression 03/18/2013  . 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 OVERWEIGHT 05/05/2009  . CONSTIPATION, CHRONIC 02/10/2009  . DYSTHYMIC DISORDER 02/06/2008  . GERD 02/06/2008  . HYPERTENSION, BENIGN 10/09/2006  . Chronic low back pain 10/09/2006    Darl PikesSusan Maryrose Colvin PT  01/16/2019, 12:08 PM  [facility] [address] [place], [place], [address] Phone: [phone]   Fax:  [phone]  Name: Megan Miller MRN: [id number] Date of Birth: 1955-12-30

## 2019-01-18 LAB — URINE CULTURE: Organism ID, Bacteria: NO GROWTH

## 2019-01-19 ENCOUNTER — Other Ambulatory Visit: Payer: Self-pay | Admitting: *Deleted

## 2019-01-19 LAB — POCT URINALYSIS DIP (MANUAL ENTRY)
Blood, UA: NEGATIVE
Glucose, UA: NEGATIVE mg/dL
Ketones, POC UA: NEGATIVE mg/dL
Leukocytes, UA: NEGATIVE
Nitrite, UA: NEGATIVE
Protein Ur, POC: 30 mg/dL — AB
Spec Grav, UA: >=1.03 — AB (ref 1.010–1.025)
Urobilinogen, UA: 1 E.U./dL
pH, UA: 6 (ref 5.0–8.0)

## 2019-01-19 NOTE — Assessment & Plan Note (Addendum)
Associated with urinary frequency. UA is not indicative of UTI.  WIll send urine for culture. Patient would like empiric antibiotic coverage for a UTI. Discussed risks of over-treating with abx. CBG not suggestive of hyperglycemia as cause for urinary frequency. Her HPI is consistent with UTI. - empiric treatment for UTI - send urine for culture - cephALEXin (KEFLEX) 500 MG capsule; Take 1 capsule (500 mg total) by mouth 2 (two) times daily.  Dispense: 14 capsule; Refill: 0 -return if symptoms fail to improve - return precautions discussed

## 2019-01-22 ENCOUNTER — Ambulatory Visit: Payer: Medicaid Other | Admitting: Physical Therapy

## 2019-01-22 ENCOUNTER — Encounter: Payer: Self-pay | Admitting: Physical Therapy

## 2019-01-22 ENCOUNTER — Other Ambulatory Visit: Payer: Self-pay

## 2019-01-22 DIAGNOSIS — R293 Abnormal posture: Secondary | ICD-10-CM

## 2019-01-22 DIAGNOSIS — R29898 Other symptoms and signs involving the musculoskeletal system: Secondary | ICD-10-CM

## 2019-01-22 DIAGNOSIS — M6281 Muscle weakness (generalized): Secondary | ICD-10-CM | POA: Diagnosis not present

## 2019-01-22 NOTE — Therapy (Signed)
[facility] [address] [place], [place], [address] Phone: [phone]   Fax:  [phone]  Physical Therapy Treatment  Patient Details  Name: Megan Miller MRN: [id number] Date of Birth: [birth year] Referring Provider (PT): Dr Janit PaganKehinde Eniola   Encounter Date: 01/22/2019  PT End of Session - 01/22/19 1516    Visit Number  2    Number of Visits  4    Date for PT Re-Evaluation  02/20/19    Authorization Type  MCD requesting 3 more visits through 02/20/2019    PT Start Time  1505    PT Stop Time  1550    PT Time Calculation (min)  45 min    Activity Tolerance  Patient tolerated treatment well    Behavior During Therapy  [facility] for tasks assessed/performed       Past Medical History:  Diagnosis Date  . Allergic conjunctivitis   . Chest pain   . Chronic back pain   . Constipation, chronic   . Dyspnea on exertion   . Dysthymic   . GERD (gastroesophageal reflux disease)   . Hypertension   . Leg edema    Asymetric Right leg (dopplers negative)  . Overweight (BMI 25.0-29.9)   . Stroke (HCC)   . Urination frequency     Past Surgical History:  Procedure Laterality Date  . 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 BILATERAL SALPINGOOPHORECTOMY  05/08/2001   Path=extensive endometriosis  . BREAST SURGERY  12/29/2003   Right breast core biopsy=benign fibrocystic change  . SMALL INTESTINE SURGERY  03/30/2001   Rectosigmoid bowel resection and reanastomosis    There were no vitals filed for this visit.  Subjective Assessment - 01/22/19 1512    Subjective  Pt arriving to therapy reporting no pain at present. Pt reporting she has been doing her Home Exercises.    Pertinent History  CVA last April - did not have therapy and had full resolution of symptoms.    How long can you sit comfortably?  no limitations    Patient Stated Goals  walk better,    Currently in Pain?  No/denies    Pain Onset  More than a  month ago    Aggravating Factors   standing, walking    Pain Relieving Factors  sitting                       [facility] Adult PT Treatment/Exercise - 01/22/19 0001      Exercises   Exercises  Lumbar      Lumbar Exercises: Stretches   Active Hamstring Stretch  2 reps;30 seconds    Single Knee to Chest Stretch  3 reps;30 seconds    Other Lumbar Stretch Exercise  SKTC holding 20 seconds x 5 reps each LE      Lumbar Exercises: Seated   Long Arc Quad on Chair  AROM;Strengthening;15 reps    Sit to Stand  5 reps;Limitations    Sit to Stand Limitations  Pt reporting L hamstring cramping      Lumbar Exercises: Supine   Glut Set  15 reps;5 seconds    Clam  10 reps;3 seconds    Bridge  15 reps;3 seconds;Limitations    Bridge Limitations  need tactile cues for lift off    Straight Leg Raise  10 reps;2 seconds    Other Supine Lumbar Exercises  marching x 30 reps    Other Supine Lumbar Exercises  hamstring  sets x 10 holding 5 seconds each      Lumbar Exercises: Sidelying   Clam  Both;15 reps;3 seconds    Clam Limitations  needed assist to prevent rolling onto her back      Lumbar Exercises: Prone   Other Prone Lumbar Exercises  HS curls x 15 reps on each LE             PT Education - 01/22/19 1514    Education Details  verbally reviewed HEP    Person(s) Educated  Patient    Methods  Explanation    Comprehension  Verbalized understanding          PT Long Term Goals - 01/22/19 1521      PT LONG TERM GOAL #1   Title  I with HEP to include a walking program ( 02/20/2019)    Baseline  not exercising    Time  5    Period  Weeks    Status  New      PT LONG TERM GOAL #2   Title  demo bilat hip extension and abduction strength =/> 4+/5 to assist with mobility ( 02/20/2019)    Baseline  2+ hip extension, 4-/5 abduction strength    Time  5    Period  Weeks    Status  New      PT LONG TERM GOAL #3   Title  improve ODI =/> 30% limited    Baseline  46% limited     Time  5    Period  Weeks    Status  New      PT LONG TERM GOAL #4   Title  walk in the community with upright posture and no more than 2/10 back and leg pain    Baseline  tolerates about 5' of walking with 8/10 pain    Time  5    Period  Weeks    Status  New            Plan - 01/22/19 1517    Clinical Impression Statement  Pt presenting with no pain at start of session. Pt with weakness in bilateral hips and core. Pt performed lumbar stretching, LE streching, and strengthening exercises. Pt toleraing well. Continue with skilled PT as pt tolerates toward goals set.    Personal Factors and Comorbidities  Comorbidity 3+    Examination-Activity Limitations  Sleep;Carry;Dressing;Other    Examination-Participation Restrictions  Community Activity;Shop    Stability/Clinical Decision Making  Stable/Uncomplicated    Rehab Potential  Good    PT Frequency  1x / week    PT Treatment/Interventions  Stair training;Patient/family education;Therapeutic activities;Moist Heat;Ultrasound;Cryotherapy;Scientist, product/process development;Neuromuscular re-education;Manual techniques;Therapeutic exercise;Dry needling    PT Next Visit Plan  postural strengthening and body mechanic education.       Patient will benefit from skilled therapeutic intervention in order to improve the following deficits and impairments:  Pain, Increased muscle spasms, Postural dysfunction, Decreased strength  Visit Diagnosis: 1. Other symptoms and signs involving the musculoskeletal system   2. Abnormal posture        Problem List Patient Active Problem List   Diagnosis Date Noted  . Primary osteoarthritis of right knee 07/09/2018  . Pain of left hip joint 07/09/2018  . Lightheadedness 04/23/2018  . Gait instability 01/08/2018  . Chronic migraine without aura without status migrainosus, not intractable 11/13/2017  . Tension headache 11/13/2017  . Lumbosacral radiculopathy 11/12/2017  . Cerebrovascular  accident (CVA) ([place]) 11/08/2017  . Spinal stenosis  of lumbar region 11/08/2017  . Syncope 11/15/2015  . Edema of right foot 01/27/2015  . Intertrigo 12/30/2014  . Stable angina (HCC) 09/23/2014  . Hot flashes 11/27/2013  . Hair loss 09/10/2013  . Depression 03/18/2013  . Dysuria 06/24/2012  . 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 OVERWEIGHT 05/05/2009  . CONSTIPATION, CHRONIC 02/10/2009  . DYSTHYMIC DISORDER 02/06/2008  . GERD 02/06/2008  . HYPERTENSION, BENIGN 10/09/2006  . Chronic low back pain 10/09/2006    Sharmon LeydenJennifer R , PT 01/22/2019, 3:47 PM  [facility] [address] [place], [place], [address] Phone: [phone]   Fax:  [phone]  Name: Megan Miller MRN: [id number] Date of Birth: 19-May-1956

## 2019-01-28 ENCOUNTER — Encounter: Payer: Self-pay | Admitting: Physical Therapy

## 2019-01-28 ENCOUNTER — Other Ambulatory Visit: Payer: Self-pay

## 2019-01-28 ENCOUNTER — Ambulatory Visit: Payer: Medicaid Other | Attending: Family Medicine | Admitting: Physical Therapy

## 2019-01-28 DIAGNOSIS — R29898 Other symptoms and signs involving the musculoskeletal system: Secondary | ICD-10-CM | POA: Insufficient documentation

## 2019-01-28 DIAGNOSIS — R293 Abnormal posture: Secondary | ICD-10-CM | POA: Diagnosis present

## 2019-01-28 DIAGNOSIS — M6281 Muscle weakness (generalized): Secondary | ICD-10-CM | POA: Insufficient documentation

## 2019-01-28 NOTE — Therapy (Signed)
[facility] [address] [place], [place], [address] Phone: [phone]   Fax:  [phone]  Physical Therapy Treatment  Patient Details  Name: Megan Miller MRN: [id number] Date of Birth: 12-04-1955 Referring Provider (PT): Dr Janit Pagan   Encounter Date: 01/28/2019  PT End of Session - 01/28/19 1610    Visit Number  3    Number of Visits  4    Date for PT Re-Evaluation  02/20/19    Authorization Type  MCD requesting 3 more visits through 02/20/2019    PT Start Time  1602    PT Stop Time  1645    PT Time Calculation (min)  43 min    Activity Tolerance  Patient tolerated treatment well    Behavior During Therapy  [facility] for tasks assessed/performed       Past Medical History:  Diagnosis Date  . Allergic conjunctivitis   . Chest pain   . Chronic back pain   . Constipation, chronic   . Dyspnea on exertion   . Dysthymic   . GERD (gastroesophageal reflux disease)   . Hypertension   . Leg edema    Asymetric Right leg (dopplers negative)  . Overweight (BMI 25.0-29.9)   . Stroke (HCC)   . Urination frequency     Past Surgical History:  Procedure Laterality Date  . 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 BILATERAL SALPINGOOPHORECTOMY  05/08/2001   Path=extensive endometriosis  . BREAST SURGERY  12/29/2003   Right breast core biopsy=benign fibrocystic change  . SMALL INTESTINE SURGERY  03/30/2001   Rectosigmoid bowel resection and reanastomosis    There were no vitals filed for this visit.  Subjective Assessment - 01/28/19 1608    Subjective  Patient reports no pain today. Some days she reports she has pain some days she dosent. She was sore after the last visit but it only lasted 1 day.    Pertinent History  CVA last April - did not have therapy and had full resolution of symptoms.    How long can you sit comfortably?  no limitations    How long can you walk comfortably?  tolerates > 5'     Diagnostic tests  none    Patient Stated Goals  walk better,    Currently in Pain?  No/denies                       [facility] Adult PT Treatment/Exercise - 01/28/19 0001      Lumbar Exercises: Stretches   Active Hamstring Stretch  2 reps;30 seconds    Single Knee to Chest Stretch  3 reps;30 seconds    Lower Trunk Rotation Limitations  x10 5 sec holds eac       Lumbar Exercises: Aerobic   Nustep  x5 min       Lumbar Exercises: Standing   Other Standing Lumbar Exercises  standing march 2x10; standing hip extension x5 each leg with cuing for tehcnique; standing hip extension with cuing for technique.       Lumbar Exercises: Supine   Clam  10 reps;3 seconds    Clam Limitations  yellow band     Bridge  15 reps;3 seconds;Limitations    Straight Leg Raise  10 reps;2 seconds    Other Supine Lumbar Exercises  marching x 30 reps             PT Education - 01/28/19 1609    Education  Details  reviewed home exercises and symptom mangement with exercises    Person(s) Educated  Patient    Methods  Explanation;Demonstration;Other (comment)    Comprehension  Verbalized understanding          PT Long Term Goals - 01/22/19 1521      PT LONG TERM GOAL #1   Title  I with HEP to include a walking program ( 02/20/2019)    Baseline  not exercising    Time  5    Period  Weeks    Status  New      PT LONG TERM GOAL #2   Title  demo bilat hip extension and abduction strength =/> 4+/5 to assist with mobility ( 02/20/2019)    Baseline  2+ hip extension, 4-/5 abduction strength    Time  5    Period  Weeks    Status  New      PT LONG TERM GOAL #3   Title  improve ODI =/> 30% limited    Baseline  46% limited    Time  5    Period  Weeks    Status  New      PT LONG TERM GOAL #4   Title  walk in the community with upright posture and no more than 2/10 back and leg pain    Baseline  tolerates about 5' of walking with 8/10 pain    Time  5    Period  Weeks    Status  New             Plan - 01/28/19 2125    Clinical Impression Statement  Patient tolerated treatment well. She demonstrated good hip mobility. Therapy added the nu-step. She tolerated well. Therapy also added standing exercises. She fatigued quickly. She was given standing exercises for home but adivsed to stay with her other exercises if they casued her pain. Patient will require a re-assessment for mediciad next vist    Personal Factors and Comorbidities  Comorbidity 3+    Examination-Activity Limitations  Sleep;Carry;Dressing;Other    Examination-Participation Restrictions  Community Activity;Shop    Stability/Clinical Decision Making  Stable/Uncomplicated    Clinical Decision Making  Low    Rehab Potential  Good    PT Frequency  1x / week    PT Treatment/Interventions  Stair training;Patient/family education;Therapeutic activities;Moist Heat;Ultrasound;Cryotherapy;Psychologist, educational;Neuromuscular re-education;Manual techniques;Therapeutic exercise;Dry needling    PT Next Visit Plan  postural strengthening and body mechanic education.    Consulted and Agree with Plan of Care  Patient       Patient will benefit from skilled therapeutic intervention in order to improve the following deficits and impairments:  Pain, Increased muscle spasms, Postural dysfunction, Decreased strength  Visit Diagnosis: 1. Other symptoms and signs involving the musculoskeletal system   2. Abnormal posture   3. Muscle weakness (generalized)        Problem List Patient Active Problem List   Diagnosis Date Noted  . Primary osteoarthritis of right knee 07/09/2018  . Pain of left hip joint 07/09/2018  . Lightheadedness 04/23/2018  . Gait instability 01/08/2018  . Chronic migraine without aura without status migrainosus, not intractable 11/13/2017  . Tension headache 11/13/2017  . Lumbosacral radiculopathy 11/12/2017  . Cerebrovascular accident (CVA) (HCC) 11/08/2017  . Spinal stenosis  of lumbar region 11/08/2017  . Syncope 11/15/2015  . Edema of right foot 01/27/2015  . Intertrigo 12/30/2014  . Stable angina (HCC) 09/23/2014  . Hot flashes 11/27/2013  . Hair loss  09/10/2013  . Depression 03/18/2013  . Dysuria 06/24/2012  . 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 OVERWEIGHT 05/05/2009  . CONSTIPATION, CHRONIC 02/10/2009  . DYSTHYMIC DISORDER 02/06/2008  . GERD 02/06/2008  . HYPERTENSION, BENIGN 10/09/2006  . Chronic low back pain 10/09/2006    Dessie Coma PT DPT  01/28/2019, 9:33 PM  [facility] [address] [place], [place], [address] Phone: [phone]   Fax:  [phone]  Name: Megan Miller MRN: [id number] Date of Birth: Jan 06, 1956

## 2019-02-05 ENCOUNTER — Ambulatory Visit: Payer: Medicaid Other | Admitting: Physical Therapy

## 2019-02-05 ENCOUNTER — Other Ambulatory Visit: Payer: Self-pay

## 2019-02-05 ENCOUNTER — Encounter: Payer: Self-pay | Admitting: Physical Therapy

## 2019-02-05 DIAGNOSIS — R29898 Other symptoms and signs involving the musculoskeletal system: Secondary | ICD-10-CM

## 2019-02-05 DIAGNOSIS — M6281 Muscle weakness (generalized): Secondary | ICD-10-CM

## 2019-02-05 DIAGNOSIS — R293 Abnormal posture: Secondary | ICD-10-CM

## 2019-02-05 NOTE — Therapy (Signed)
[place], [place], [address] Phone: [phone]   Fax:  [phone]  Physical Therapy Treatment Discharge   Patient Details  Name: Megan Miller MRN: [id number] Date of Birth: [birth year] Referring Provider (PT): Dr Andrena Mews   Encounter Date: 02/05/2019  PT End of Session - 02/05/19 1259    Visit Number  4    Number of Visits  4    Date for PT Re-Evaluation  02/20/19    Authorization Type  discharging pt on 02/05/2019    PT Start Time  1234    PT Stop Time  1320    PT Time Calculation (min)  46 min    Activity Tolerance  Patient tolerated treatment well    Behavior During Therapy  [facility] for tasks assessed/performed       Past Medical History:  Diagnosis Date  . Allergic conjunctivitis   . Chest pain   . Chronic back pain   . Constipation, chronic   . Dyspnea on exertion   . Dysthymic   . GERD (gastroesophageal reflux disease)   . Hypertension   . Leg edema    Asymetric Right leg (dopplers negative)  . Overweight (BMI 25.0-29.9)   . Stroke ([place])   . Urination frequency     Past Surgical History:  Procedure Laterality Date  . 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 BILATERAL SALPINGOOPHORECTOMY  05/08/2001   Path=extensive endometriosis  . BREAST SURGERY  12/29/2003   Right breast core biopsy=benign fibrocystic change  . SMALL INTESTINE SURGERY  03/30/2001   Rectosigmoid bowel resection and reanastomosis    There were no vitals filed for this visit.  Subjective Assessment - 02/05/19 1239    Subjective  Pt arriving today reporting 7-8/10 pain in her legs.    Pertinent History  CVA last April - did not have therapy and had full resolution of symptoms.    How long can you sit comfortably?  no limitations    How long can you walk comfortably?  tolerates > 5'    Diagnostic tests  none    Patient Stated Goals  walk better,    Currently in Pain?  Yes    Pain Score   8     Pain Location  Leg    Pain Orientation  Right;Left    Pain Descriptors / Indicators  Tightness    Pain Type  Acute pain    Pain Onset  More than a month ago    Pain Frequency  Constant                       [facility] Adult PT Treatment/Exercise - 02/05/19 0001      Lumbar Exercises: Stretches   Active Hamstring Stretch  2 reps;30 seconds   seated   Single Knee to Chest Stretch  3 reps;30 seconds    Lower Trunk Rotation Limitations  x10 5 sec holds eac       Lumbar Exercises: Standing   Other Standing Lumbar Exercises  standing march 2x10; standing hip extension x5 each leg with cuing for tehcnique; standing hip extension with cuing for technique.       Lumbar Exercises: Seated   Long Arc Quad on Chair  AROM;Strengthening;Right;Left;15 reps      Lumbar Exercises: Supine   Clam  10 reps;3 seconds    Clam Limitations  red band    Bridge  15 reps;3 seconds;Limitations  Straight Leg Raise  10 reps;2 seconds             PT Education - 02/05/19 1258    Education Details  HEP print out    Person(s) Educated  Patient    Methods  Explanation;Demonstration;Handout    Comprehension  Verbalized understanding          PT Long Term Goals - 02/05/19 1241      PT LONG TERM GOAL #1   Title  I with HEP to include a walking program ( 02/20/2019)    Baseline  not exercising    Time  5    Period  Weeks    Status  Achieved      PT LONG TERM GOAL #2   Title  demo bilat hip extension and abduction strength =/> 4+/5 to assist with mobility ( 02/20/2019)    Baseline  Pt with hip abduction 4+/5 bilaterally, Pt with bilateral hip extension 3/5.    Time  5    Period  Weeks    Status  Partially Met      PT LONG TERM GOAL #3   Title  improve ODI =/> 30% limited    Status  On-going      PT LONG TERM GOAL #4   Title  walk in the community with upright posture and no more than 2/10 back and leg pain    Baseline  Pt reporting that she has periods where she can walk  to her mailbox and in grocery story with pain 2/10 or less.    Time  5    Period  Weeks    Status  Achieved            Plan - 02/05/19 1253    Clinical Impression Statement  Pt tolerating exericses well. Pt reports she feels like she can contiue with her HEP and do well. Pt was instructed to call her PCP if her symptoms began to worsen or she feels like she is not able to progress on her own. Pt has met all her LTG's but one.    Personal Factors and Comorbidities  Comorbidity 3+    Examination-Activity Limitations  Sleep;Carry;Dressing;Other    Examination-Participation Restrictions  Community Activity;Shop    Stability/Clinical Decision Making  Stable/Uncomplicated    PT Treatment/Interventions  Stair training;Patient/family education;Therapeutic activities;Moist Heat;Ultrasound;Cryotherapy;Scientist, product/process development;Neuromuscular re-education;Manual techniques;Therapeutic exercise;Dry needling    PT Next Visit Plan  postural strengthening and body mechanic education.    PT Home Exercise Plan  exercise handout issued    Consulted and Agree with Plan of Care  Patient       Patient will benefit from skilled therapeutic intervention in order to improve the following deficits and impairments:  Pain, Increased muscle spasms, Postural dysfunction, Decreased strength  Visit Diagnosis: 1. Abnormal posture   2. Muscle weakness (generalized)   3. Other symptoms and signs involving the musculoskeletal system        Problem List Patient Active Problem List   Diagnosis Date Noted  . Primary osteoarthritis of right knee 07/09/2018  . Pain of left hip joint 07/09/2018  . Lightheadedness 04/23/2018  . Gait instability 01/08/2018  . Chronic migraine without aura without status migrainosus, not intractable 11/13/2017  . Tension headache 11/13/2017  . Lumbosacral radiculopathy 11/12/2017  . Cerebrovascular accident (CVA) ([place]) 11/08/2017  . Spinal stenosis of lumbar region  11/08/2017  . Syncope 11/15/2015  . Edema of right foot 01/27/2015  . Intertrigo 12/30/2014  . Stable angina (HCC)  09/23/2014  . Hot flashes 11/27/2013  . Hair loss 09/10/2013  . Depression 03/18/2013  . Dysuria 06/24/2012  . 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 OVERWEIGHT 05/05/2009  . CONSTIPATION, CHRONIC 02/10/2009  . DYSTHYMIC DISORDER 02/06/2008  . GERD 02/06/2008  . HYPERTENSION, BENIGN 10/09/2006  . Chronic low back pain 10/09/2006    Oretha Caprice, PT 02/05/2019, 1:02 PM  [facility] [address] [place], [place], [address] Phone: [phone]   Fax:  [phone]  Name: ANSLEE MICHELETTI MRN: [id number] Date of Birth: 10/25/1955

## 2019-02-13 ENCOUNTER — Other Ambulatory Visit: Payer: Self-pay | Admitting: *Deleted

## 2019-02-13 DIAGNOSIS — K219 Gastro-esophageal reflux disease without esophagitis: Secondary | ICD-10-CM

## 2019-02-16 ENCOUNTER — Telehealth: Payer: Self-pay | Admitting: *Deleted

## 2019-02-16 MED ORDER — RABEPRAZOLE SODIUM 20 MG PO TBEC: 20.0000 mg | DELAYED_RELEASE_TABLET | Freq: Every day | ORAL | 2 refills | Status: DC

## 2019-02-16 NOTE — Telephone Encounter (Signed)
Prior approval for Aciphex completed via [employer].  Med approved for 02/16/2019 - 02/16/2020.  Prior approval # [id number].   [facility] pharmacy informed.  Christen Bame, CMA

## 2019-03-11 ENCOUNTER — Encounter: Payer: Self-pay | Admitting: Family Medicine

## 2019-03-11 ENCOUNTER — Ambulatory Visit (INDEPENDENT_AMBULATORY_CARE_PROVIDER_SITE_OTHER): Payer: Medicaid Other | Admitting: Family Medicine

## 2019-03-11 ENCOUNTER — Other Ambulatory Visit: Payer: Self-pay

## 2019-03-11 VITALS — BP 132/70 | HR 84 | Ht 61.0 in | Wt 181.5 lb

## 2019-03-11 DIAGNOSIS — I10 Essential (primary) hypertension: Secondary | ICD-10-CM

## 2019-03-11 DIAGNOSIS — L659 Nonscarring hair loss, unspecified: Secondary | ICD-10-CM | POA: Diagnosis not present

## 2019-03-11 DIAGNOSIS — Z0000 Encounter for general adult medical examination without abnormal findings: Secondary | ICD-10-CM

## 2019-03-11 LAB — POCT GLYCOSYLATED HEMOGLOBIN (HGB A1C): Hemoglobin A1C: 5.5 % (ref 4.0–5.6)

## 2019-03-11 NOTE — Assessment & Plan Note (Signed)
Patient doing well.  Lipid panel and BMP pending.  Screening A1C is within normal limits.  Patient taking Rx medication as prescribed.

## 2019-03-11 NOTE — Patient Instructions (Signed)
It was great seeing you today!  I referred you to dermatology.  They will call you to schedule an appointment.     I'd like to see you back 1 year but if you need to be seen earlier than that for any new issues we're happy to fit you in, just give [place] a call!   If you have questions or concerns please do not hesitate to call at [phone].

## 2019-03-11 NOTE — Progress Notes (Signed)
    Subjective:  Megan Miller is a 63 y.o. female who presents to the [facility] today with a chief complaint of annual exam.     HPI: Megan Miller is a 63 y.o. female with history of CVA, GERD and OA presents for annual physical.  She expresses concerns about itching underneath her breasts and bilateral hands.  She also expresses concerns for ongoing hair loss.  Otherwise has been doing well.     PMHx: CVA, GERD, OA, Depression,  SHx: Hysterectomy, Right breast core biopsy (benign), rectosigmoid bowel resection and reanastomosis  Soc Hx: Megan Miller denies smoking, drinking alcohol and illicit drug use.        Objective:  Physical Exam: BP 132/70   Pulse 84   Ht 5\' 1"  (1.549 m)   Wt 181 lb 8 oz (82.3 kg)   SpO2 97%   BMI 34.29 kg/m    GEN:     alert, friendly, no distress    HENT:  mucus membranes moist, oropharyngeal without lesions or erythema,  nares patent, no nasal discharge, wears glasses   EYES: pupils equal and reactive, extra occular movements intact NECK:  normal ROM, no lymphadenopathy  RESP:  clear to auscultation bilaterally, no increased work of breathing  CVS:   regular rate and rhythm, no murmur, distal pulses intact   ABD:  soft, non-tender; bowel sounds present; no palpable masses BREAST:  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, no rash or erythema  EXT:   normal ROM, atraumatic, baseline right lower extremity edema  NEURO:  normal without focal findings,  speech normal, alert and oriented, CN 2-12 grossly intact  Skin:   warm and dry,    Results for orders placed or performed in visit on 03/11/19 (from the past 72 hour(s))  HgB A1c     Status: None   Collection Time: 03/11/19 10:54 AM  Result Value Ref Range   Hemoglobin A1C 5.5 4.0 - 5.6 %   HbA1c POC (<> result, manual entry)     HbA1c, POC (prediabetic range)     HbA1c, POC (controlled diabetic range)       Assessment/Plan:  Hair loss Patient with ongoing hair loss.  Referral placed to  dermatology.   Routine adult health maintenance Patient doing well.  Lipid panel and BMP pending.  Screening A1C is within normal limits.  Patient taking Rx medication as prescribed.    Orders Placed This Encounter  Procedures  . Lipid Panel  . Basic Metabolic Panel  . HgB A1c    No orders of the defined types were placed in this encounter.   Health Maintenance reviewed -  Patient to had Hep C screening completed in May 2016, mammogram last in 04/2018,  no PAP needed as pt had hysterectomy.  Discussed need to get influenza vaccination this year.     Lyndee Hensen, DO PGY-1, [facility] 03/11/2019 10:50 AM

## 2019-03-11 NOTE — Assessment & Plan Note (Addendum)
Patient with ongoing hair loss.  Referral placed to dermatology.

## 2019-03-12 ENCOUNTER — Encounter: Payer: Self-pay | Admitting: Family Medicine

## 2019-03-12 LAB — LIPID PANEL
Chol/HDL Ratio: 2 ratio (ref 0.0–4.4)
Cholesterol, Total: 104 mg/dL (ref 100–199)
HDL: 52 mg/dL (ref >39)
LDL Calculated: 39 mg/dL (ref 0–99)
Triglycerides: 67 mg/dL (ref 0–149)
VLDL Cholesterol Cal: 13 mg/dL (ref 5–40)

## 2019-03-12 LAB — BASIC METABOLIC PANEL
BUN/Creatinine Ratio: 13 (ref 12–28)
BUN: 10 mg/dL (ref 8–27)
CO2: 21 mmol/L (ref 20–29)
Calcium: 9.3 mg/dL (ref 8.7–10.3)
Chloride: 106 mmol/L (ref 96–106)
Creatinine, Ser: 0.79 mg/dL (ref 0.57–1.00)
GFR calc Af Amer: 92 mL/min/{1.73_m2} (ref >59)
GFR calc non Af Amer: 80 mL/min/{1.73_m2} (ref >59)
Glucose: 93 mg/dL (ref 65–99)
Potassium: 3.8 mmol/L (ref 3.5–5.2)
Sodium: 142 mmol/L (ref 134–144)

## 2019-03-23 ENCOUNTER — Telehealth: Payer: Self-pay | Admitting: *Deleted

## 2019-03-23 NOTE — Telephone Encounter (Signed)
Called patient.  She wants Tramadol for back pain.  Asked patient to contact the clinic that is following her back pain and recent imaging regarding need for pain medication.

## 2019-03-23 NOTE — Telephone Encounter (Signed)
Pt lmovm for MD.  She states that she was told to take a medication for her legs but cant remember the name.  To PCP. Christen Bame, CMA

## 2019-03-24 ENCOUNTER — Other Ambulatory Visit: Payer: Self-pay

## 2019-03-24 MED ORDER — GABAPENTIN 100 MG PO CAPS: 100.0000 mg | ORAL_CAPSULE | Freq: Three times a day (TID) | ORAL | 0 refills | Status: DC | PRN

## 2019-04-21 ENCOUNTER — Other Ambulatory Visit: Payer: Self-pay | Admitting: *Deleted

## 2019-04-22 MED ORDER — CARVEDILOL 3.125 MG PO TABS: 3.1250 mg | ORAL_TABLET | Freq: Two times a day (BID) | ORAL | 2 refills | Status: DC

## 2019-05-21 ENCOUNTER — Other Ambulatory Visit: Payer: Self-pay | Admitting: Family Medicine

## 2019-05-21 DIAGNOSIS — Z1231 Encounter for screening mammogram for malignant neoplasm of breast: Secondary | ICD-10-CM

## 2019-06-23 ENCOUNTER — Ambulatory Visit (HOSPITAL_COMMUNITY)
Admission: RE | Admit: 2019-06-23 | Discharge: 2019-06-23 | Disposition: A | Discharge status: Home / Self Care | Payer: Medicaid Other | Source: Ambulatory Visit | Attending: Family Medicine | Admitting: Family Medicine

## 2019-06-23 ENCOUNTER — Other Ambulatory Visit: Payer: Self-pay

## 2019-06-23 ENCOUNTER — Ambulatory Visit (INDEPENDENT_AMBULATORY_CARE_PROVIDER_SITE_OTHER): Payer: Medicaid Other | Admitting: Family Medicine

## 2019-06-23 VITALS — BP 140/68 | HR 97 | Ht 61.0 in | Wt 182.1 lb

## 2019-06-23 DIAGNOSIS — M5417 Radiculopathy, lumbosacral region: Secondary | ICD-10-CM

## 2019-06-23 DIAGNOSIS — R109 Unspecified abdominal pain: Secondary | ICD-10-CM

## 2019-06-23 DIAGNOSIS — R079 Chest pain, unspecified: Secondary | ICD-10-CM | POA: Diagnosis present

## 2019-06-23 DIAGNOSIS — K219 Gastro-esophageal reflux disease without esophagitis: Secondary | ICD-10-CM | POA: Diagnosis not present

## 2019-06-23 LAB — POCT URINALYSIS DIP (MANUAL ENTRY)
Bilirubin, UA: NEGATIVE
Glucose, UA: NEGATIVE mg/dL
Ketones, POC UA: NEGATIVE mg/dL
Leukocytes, UA: NEGATIVE
Nitrite, UA: NEGATIVE
Protein Ur, POC: NEGATIVE mg/dL
Spec Grav, UA: 1.025 (ref 1.010–1.025)
Urobilinogen, UA: 4 E.U./dL — AB
pH, UA: 6.5 (ref 5.0–8.0)

## 2019-06-23 LAB — POCT UA - MICROSCOPIC ONLY

## 2019-06-23 MED ORDER — FAMOTIDINE 20 MG PO TABS: 20.0000 mg | ORAL_TABLET | Freq: Every day | ORAL | 2 refills | Status: DC

## 2019-06-23 NOTE — Patient Instructions (Addendum)
Abdominal pain - I think it is most likely that your abdominal pain is related to your reflux.  In addition to your acephex, I think we can add famotidine at night to help with your symptoms.  Back pain - I am sorry that your back pain seems to be flaring up again.  I will place an order for physical therapy again but I am not sure it will be cover by your insurance.  We will have to see.  In the meantime, continue using your current medicaitons: -Tylenol as needed -Ibuprofen for bad days -gabapentin as prescribed -Voltaren gel as needed -tramadol as prescribed

## 2019-06-23 NOTE — Assessment & Plan Note (Signed)
ECG performed in the office shows no Q waves, T wave inversions or ST changes.  No evidence of any ischemic activity in the setting of current mild epigastric discomfort.  This is most likely related to her known history of GERD.  We will continue her current medication and add an H2 blocker. -Continue AcipHex daily -Start famotidine 20 mg nightly for the next week or until symptoms improve.  Once symptoms improve, use as needed.

## 2019-06-23 NOTE — Assessment & Plan Note (Signed)
She is encouraged to continue with her current pain regimen.  She is all that her referral to physical therapy would be placed but it may not be covered by current insurance. -Tylenol 650 mg every 6 hours as needed -Gabapentin 100 mg 3 times daily as needed -Ibuprofen 400 mg as often as 4 times daily (she was encouraged to only use this on particularly bad days due to her history of previous CVA) -Referral to physical therapy placed

## 2019-06-23 NOTE — Progress Notes (Signed)
Subjective:  Megan Miller is a 63 y.o. female who presents to the [facility] today with a chief complaint of epigastric pain and back pain.   HPI:  GERD Megan Miller has a history of reflux and is now coming in with several weeks of recurrent epigastric pain. The pain is difficult for her to describe but it is a mild discomfort and generally dull in quality, not sharp.  No radiation to her left arm, jaw, back. She reports that she notices this middle upper stomach discomfort primarily when lying down.  She has not noted any association with eating or particular foods.  She thinks it may be related to exertion but is not sure.  She reports that she is experiencing a dull discomfort in the office.  She denies, tachycardia diaphoresis.  Back pain, chronic For the past 2 weeks, she has had a mild worsening of her chronic back pain.  At her last visit, she was recommended to be assessed by neurosurgery.  She reports that she has been seen by neurosurgery did not recommend any intervention at this time (this note cannot be seen in her chart) she has previously been to physical therapy which she found helpful and is currently prescribed Tylenol, ibuprofen, gabapentin, Voltaren gel for her back pain.  She would like to know if she can return to physical therapy again.  Chief Complaint noted Review of Symptoms - see HPI PMH -   GERD, spinal stenosis of lumbar region  Objective:  Physical Exam: BP 140/68   Pulse 97   Ht 5\' 1"  (1.549 m)   Wt 182 lb 2 oz (82.6 kg)   SpO2 99%   BMI 34.41 kg/m    Gen: NAD, resting comfortably CV: RRR with no murmurs appreciated.  No tenderness with palpation of the costochondral junction.  No notable JVD while seated. GI: Normal bowel sounds present. Soft, Nontender, Nondistended.  No tenderness with palpation of the epigastric region. Skin: Warm, dry Extremities: No lower extremity edema.  Radial pulse intact.   Results for orders placed or performed in  visit on 06/23/19 (from the past 72 hour(s))  POCT urinalysis dipstick     Status: Abnormal   Collection Time: 06/23/19 11:40 AM  Result Value Ref Range   Color, UA yellow yellow   Clarity, UA clear clear   Glucose, UA negative negative mg/dL   Bilirubin, UA negative negative   Ketones, POC UA negative negative mg/dL   Spec Grav, UA 1.025 1.010 - 1.025   Blood, UA trace-intact (A) negative   pH, UA 6.5 5.0 - 8.0   Protein Ur, POC negative negative mg/dL   Urobilinogen, UA 4.0 (A) 0.2 or 1.0 E.U./dL   Nitrite, UA Negative Negative   Leukocytes, UA Negative Negative  POCT UA - Microscopic Only     Status: Abnormal   Collection Time: 06/23/19 11:40 AM  Result Value Ref Range   WBC, Ur, HPF, POC NONE    RBC, urine, microscopic OCCASIONAL    Bacteria, U Microscopic FEW    Epithelial cells, urine per micros 10-20      Assessment/Plan:  GERD ECG performed in the office shows no Q waves, T wave inversions or ST changes.  No evidence of any ischemic activity in the setting of current mild epigastric discomfort.  This is most likely related to her known history of GERD.  We will continue her current medication and add an H2 blocker. -Continue AcipHex daily -Start famotidine 20 mg nightly  for the next week or until symptoms improve.  Once symptoms improve, use as needed.  Lumbosacral radiculopathy She is encouraged to continue with her current pain regimen.  She is all that her referral to physical therapy would be placed but it may not be covered by current insurance. -Tylenol 650 mg every 6 hours as needed -Gabapentin 100 mg 3 times daily as needed -Ibuprofen 400 mg as often as 4 times daily (she was encouraged to only use this on particularly bad days due to her history of previous CVA) -Referral to physical therapy placed

## 2019-07-06 ENCOUNTER — Other Ambulatory Visit: Payer: Self-pay | Admitting: *Deleted

## 2019-07-06 DIAGNOSIS — K219 Gastro-esophageal reflux disease without esophagitis: Secondary | ICD-10-CM

## 2019-07-07 MED ORDER — RABEPRAZOLE SODIUM 20 MG PO TBEC: 20.0000 mg | DELAYED_RELEASE_TABLET | Freq: Every day | ORAL | 11 refills | Status: DC

## 2019-07-15 ENCOUNTER — Other Ambulatory Visit: Payer: Self-pay

## 2019-07-15 ENCOUNTER — Ambulatory Visit
Admission: RE | Admit: 2019-07-15 | Discharge: 2019-07-15 | Disposition: A | Discharge status: Home / Self Care | Payer: Medicaid Other | Source: Ambulatory Visit | Attending: *Deleted | Admitting: *Deleted

## 2019-07-15 DIAGNOSIS — Z1231 Encounter for screening mammogram for malignant neoplasm of breast: Secondary | ICD-10-CM

## 2019-09-11 ENCOUNTER — Telehealth: Payer: Self-pay | Admitting: Family Medicine

## 2019-09-11 NOTE — Telephone Encounter (Signed)
Pt dropped off application for disability license plate.  Last appt. Date 06/23/2019. Form placed in blue folder

## 2019-09-11 NOTE — Telephone Encounter (Signed)
Clinical info completed on handicap form.  Place form in Dr. Lezlie Octave box for completion.  Megan Miller, CMA

## 2019-09-15 NOTE — Telephone Encounter (Signed)
LVM informing patient of form ready for pick up. Form as been placed up front.

## 2019-09-18 ENCOUNTER — Other Ambulatory Visit: Payer: Self-pay

## 2019-09-18 ENCOUNTER — Telehealth (INDEPENDENT_AMBULATORY_CARE_PROVIDER_SITE_OTHER): Payer: Medicaid Other | Admitting: Family Medicine

## 2019-09-18 DIAGNOSIS — R0789 Other chest pain: Secondary | ICD-10-CM | POA: Insufficient documentation

## 2019-09-18 MED ORDER — CIMETIDINE 200 MG PO TABS: 200.0000 mg | ORAL_TABLET | Freq: Every day | ORAL | 0 refills | Status: DC

## 2019-09-18 NOTE — Addendum Note (Signed)
Addended by: Unknown Jim on: 09/18/2019 04:33 PM   Modules accepted: Orders

## 2019-09-18 NOTE — Progress Notes (Signed)
[facility] Telemedicine Visit  Patient consented to have virtual visit. Method of visit: Telephone  Encounter participants: Patient: Megan Miller - located at home Provider: Unknown Jim - located at [facility] Others (if applicable): none  Chief Complaint: Burning Chest Pain  HPI:  Megan Miller is a 64 y.o. female with the following complaints:  Little burning pain in chest and has been having sensation of something sitting in her throat for a few weeks No pain with swallowing Has had no improvement Has been taking AcipHex daily, but it isn't helping No fevers, difficulty breathing Has been feeling congested off and on but states that this is chronic and not part of this Has had this feeling before, in November 2020, seen by Dr. Huston Foley on famotidine and it helped, but stopped because she was having leg cramping Burning in chest comes up from her stomach, no radiation down arms, no jaw pain, no diaphoresis Pain is constant, doesn't come and go, nothing makes it better, nothing makes it worse No pain with exertion, no dyspnea on exertion Pain doesn't worsen after eating Has the sensation of acid coming up in her throat, no vomiting No cough No certain foods make it worse Reports that she usese ibuprofen very rarely, only once last week  ROS: per HPI  Pertinent PMHx: HTN, CVA, GERD  Exam:  Respiratory: Breathing comfortably on room air, no acute distress over the phone  Assessment/Plan:  Burning chest pain Patient had similar pain in November 2020, EKG performed at that time without acute changes.  This is reassuring, likely not cardiac in origin.  It is most likely that this is untreated GERD.  She reports minimal use of NSAIDs, advised her to not use as this can aggravate the problem.  Given that this seems her GERD is not well controlled and is requiring 2 agents, especially since she is greater than 28 years old, will refer to  GI for possible endoscopy.  She does not have true pain with swallowing, but does have a sensation of something sitting in her throat, therefore endoscopy is likely indicated.  Patient agrees to this and was advised to call back if she does not hear from them in the next few weeks.  ED precautions discussed with her including worsening of pain, no improvement with current regimen, associated shortness of breath.  Advised her that it is important she take this seriously if it changes, as it could be a sign of a heart attack.  She voiced understanding.  Given that she does not want to retry Pepcid, will order cimetidine as this is also an H2 blocker.    Time spent during visit with patient: 16 minutes

## 2019-09-18 NOTE — Assessment & Plan Note (Addendum)
Patient had similar pain in November 2020, EKG performed at that time without acute changes.  This is reassuring, likely not cardiac in origin.  It is most likely that this is untreated GERD.  She reports minimal use of NSAIDs, advised her to not use as this can aggravate the problem.  Given that this seems her GERD is not well controlled and is requiring 2 agents, especially since she is greater than 65 years old, will refer to GI for possible endoscopy.  She does not have true pain with swallowing, but does have a sensation of something sitting in her throat, therefore endoscopy is likely indicated.  Patient agrees to this and was advised to call back if she does not hear from them in the next few weeks.  ED precautions discussed with her including worsening of pain, no improvement with current regimen, associated shortness of breath.  Advised her that it is important she take this seriously if it changes, as it could be a sign of a heart attack.  She voiced understanding.  Given that she does not want to retry Pepcid, will order cimetidine as this is also an H2 blocker.

## 2019-09-21 ENCOUNTER — Other Ambulatory Visit: Payer: Self-pay | Admitting: Family Medicine

## 2019-09-21 ENCOUNTER — Other Ambulatory Visit: Payer: Self-pay | Admitting: *Deleted

## 2019-09-21 ENCOUNTER — Encounter: Payer: Self-pay | Admitting: Nurse Practitioner

## 2019-09-21 ENCOUNTER — Telehealth: Payer: Self-pay | Admitting: *Deleted

## 2019-09-21 DIAGNOSIS — R0789 Other chest pain: Secondary | ICD-10-CM

## 2019-09-21 MED ORDER — POLYETHYLENE GLYCOL 3350 17 G PO PACK: 17.0000 g | PACK | Freq: Two times a day (BID) | ORAL | 0 refills | Status: DC

## 2019-09-21 MED ORDER — CIMETIDINE 200 MG PO TABS: 200.0000 mg | ORAL_TABLET | Freq: Every day | ORAL | 0 refills | Status: DC

## 2019-09-21 NOTE — Telephone Encounter (Signed)
Please puruse prior auth.  Patient has not tolerated famotidine in the past and ranitidine is no longer on the market.

## 2019-09-21 NOTE — Telephone Encounter (Signed)
Done and approved.  Pharmacy is aware and copy placed in batch scanning.  Jone Baseman, CMA

## 2019-09-21 NOTE — Telephone Encounter (Signed)
Cemetidine not covered by Medicaid.  Please see below of formulary.  Let "RN Team" know if you are changing to covered medications or would like to pursue a PA. Jone Baseman, CMA

## 2019-09-21 NOTE — Addendum Note (Signed)
Addended by: Henri Medal on: 09/21/2019 12:22 PM   Modules accepted: Orders

## 2019-09-22 ENCOUNTER — Other Ambulatory Visit: Payer: Self-pay

## 2019-09-22 ENCOUNTER — Emergency Department (HOSPITAL_COMMUNITY)
Admission: EM | Admit: 2019-09-22 | Discharge: 2019-09-23 | Disposition: A | Discharge status: Home / Self Care | Payer: Medicaid Other | Attending: Emergency Medicine | Admitting: Emergency Medicine

## 2019-09-22 ENCOUNTER — Encounter (HOSPITAL_COMMUNITY): Payer: Self-pay | Admitting: Emergency Medicine

## 2019-09-22 DIAGNOSIS — R112 Nausea with vomiting, unspecified: Secondary | ICD-10-CM | POA: Diagnosis present

## 2019-09-22 DIAGNOSIS — R0789 Other chest pain: Secondary | ICD-10-CM | POA: Diagnosis not present

## 2019-09-22 DIAGNOSIS — I10 Essential (primary) hypertension: Secondary | ICD-10-CM | POA: Insufficient documentation

## 2019-09-22 DIAGNOSIS — Z79899 Other long term (current) drug therapy: Secondary | ICD-10-CM | POA: Insufficient documentation

## 2019-09-22 DIAGNOSIS — Z8673 Personal history of transient ischemic attack (TIA), and cerebral infarction without residual deficits: Secondary | ICD-10-CM | POA: Insufficient documentation

## 2019-09-22 DIAGNOSIS — R1013 Epigastric pain: Secondary | ICD-10-CM | POA: Diagnosis not present

## 2019-09-22 DIAGNOSIS — Z7982 Long term (current) use of aspirin: Secondary | ICD-10-CM | POA: Insufficient documentation

## 2019-09-22 DIAGNOSIS — K219 Gastro-esophageal reflux disease without esophagitis: Secondary | ICD-10-CM | POA: Diagnosis not present

## 2019-09-22 LAB — URINALYSIS, ROUTINE W REFLEX MICROSCOPIC
Bilirubin Urine: NEGATIVE
Glucose, UA: NEGATIVE mg/dL
Hgb urine dipstick: NEGATIVE
Ketones, ur: NEGATIVE mg/dL
Leukocytes,Ua: NEGATIVE
Nitrite: NEGATIVE
Protein, ur: 100 mg/dL — AB
Specific Gravity, Urine: 1.021 (ref 1.005–1.030)
pH: 8 (ref 5.0–8.0)

## 2019-09-22 LAB — CBC
HCT: 37.3 % (ref 36.0–46.0)
Hemoglobin: 11.9 g/dL — ABNORMAL LOW (ref 12.0–15.0)
MCH: 27.9 pg (ref 26.0–34.0)
MCHC: 31.9 g/dL (ref 30.0–36.0)
MCV: 87.4 fL (ref 80.0–100.0)
Platelets: 313 10*3/uL (ref 150–400)
RBC: 4.27 MIL/uL (ref 3.87–5.11)
RDW: 13.7 % (ref 11.5–15.5)
WBC: 10.6 10*3/uL — ABNORMAL HIGH (ref 4.0–10.5)
nRBC: 0 % (ref 0.0–0.2)

## 2019-09-22 LAB — BASIC METABOLIC PANEL
Anion gap: 11 (ref 5–15)
BUN: 9 mg/dL (ref 8–23)
CO2: 23 mmol/L (ref 22–32)
Calcium: 9 mg/dL (ref 8.9–10.3)
Chloride: 104 mmol/L (ref 98–111)
Creatinine, Ser: 0.65 mg/dL (ref 0.44–1.00)
GFR calc Af Amer: >60 mL/min (ref >60)
GFR calc non Af Amer: >60 mL/min (ref >60)
Glucose, Bld: 121 mg/dL — ABNORMAL HIGH (ref 70–99)
Potassium: 3.4 mmol/L — ABNORMAL LOW (ref 3.5–5.1)
Sodium: 138 mmol/L (ref 135–145)

## 2019-09-22 LAB — TROPONIN I (HIGH SENSITIVITY): Troponin I (High Sensitivity): 3 ng/L (ref <18)

## 2019-09-22 MED ORDER — SODIUM CHLORIDE 0.9% FLUSH
3.0000 mL | Freq: Once | INTRAVENOUS | Status: AC
  Administered 2019-09-23: 3 mL via INTRAVENOUS

## 2019-09-22 NOTE — ED Triage Notes (Signed)
Pt has multiple complains w/ weakness being the main complaint.  Also complains of acid reflux -HX

## 2019-09-23 LAB — TROPONIN I (HIGH SENSITIVITY): Troponin I (High Sensitivity): 4 ng/L (ref <18)

## 2019-09-23 MED ORDER — ALUM & MAG HYDROXIDE-SIMETH 200-200-20 MG/5ML PO SUSP
30.0000 mL | Freq: Once | ORAL | Status: AC
  Administered 2019-09-23: 30 mL via ORAL
  Filled 2019-09-23: qty 30

## 2019-09-23 MED ORDER — SODIUM CHLORIDE 0.9 % IV BOLUS
1000.0000 mL | Freq: Once | INTRAVENOUS | Status: AC
  Administered 2019-09-23: 1000 mL via INTRAVENOUS

## 2019-09-23 MED ORDER — SUCRALFATE 1 G PO TABS: 1.0000 g | ORAL_TABLET | Freq: Three times a day (TID) | ORAL | 0 refills | Status: DC

## 2019-09-23 MED ORDER — LIDOCAINE VISCOUS HCL 2 % MT SOLN
15.0000 mL | Freq: Once | OROMUCOSAL | Status: AC
  Administered 2019-09-23: 15 mL via ORAL
  Filled 2019-09-23: qty 15

## 2019-09-23 MED ORDER — PANTOPRAZOLE SODIUM 40 MG IV SOLR
40.0000 mg | Freq: Once | INTRAVENOUS | Status: AC
  Administered 2019-09-23: 40 mg via INTRAVENOUS
  Filled 2019-09-23: qty 40

## 2019-09-23 MED ORDER — ONDANSETRON 4 MG PO TBDP: 4.0000 mg | ORAL_TABLET | Freq: Three times a day (TID) | ORAL | 0 refills | Status: DC | PRN

## 2019-09-23 MED ORDER — METOCLOPRAMIDE HCL 5 MG/ML IJ SOLN
10.0000 mg | INTRAMUSCULAR | Status: AC
  Administered 2019-09-23: 10 mg via INTRAVENOUS
  Filled 2019-09-23: qty 2

## 2019-09-23 NOTE — ED Notes (Signed)
Pt reports nausea/vomiting since yesterday, headache and blurred vision today, as well as acid reflux this evening. Pt denies any abdominal pain, fever, diarrhea, chest pain or shortness of breath.

## 2019-09-23 NOTE — Discharge Instructions (Signed)
Continue your daily medications.  You have been prescribed Carafate to use in addition to Aciphex.  Limit your consumption of coffee, citrus fruits, chocolate, spicy foods.  Take Zofran as needed for persistent nausea or vomiting.  We recommend follow-up with your primary care doctor as well as with gastroenterology as planned.  Return to the ED for any new or concerning symptoms.

## 2019-09-23 NOTE — ED Notes (Signed)
Patient verbalized understanding of dc instructions, vss, ambulatory with nad.   

## 2019-09-23 NOTE — ED Notes (Signed)
Patient has sprite and crackers at bedside for PO challenge, tolerating well.

## 2019-09-23 NOTE — ED Provider Notes (Signed)
[facility] EMERGENCY DEPARTMENT Provider Note   CSN: [id number] Arrival date & time: 09/22/19  1919     History Chief Complaint  Patient presents with  . Weakness  . multiple complaints    Megan Miller is a 64 y.o. female.   64 year old female with a history of esophageal reflux, hypertension, CVA presents to the emergency department for complaints of nausea and vomiting which began yesterday.  She attributes nausea and vomiting to uncontrolled acid reflux.  She states that her acid reflux will sometimes cause her to feel as though there is something in her throat.  She has a discomfort through her chest and epigastrium which is burning in nature.  Has been taking Aciphex without relief; unable to tolerate due to vomiting.  Has sporadically tried Pepto-Bismol for persistent symptoms without relief.  No associated fevers, hematemesis, shortness of breath, diarrhea, melena, hematochezia, urinary symptoms, syncope.  She states that she is scheduled to see a gastroenterologist next week for evaluation of her "stomach issues".  The history is provided by the patient. No language interpreter was used.       Past Medical History:  Diagnosis Date  . Allergic conjunctivitis   . Chest pain   . Chronic back pain   . Constipation, chronic   . Dyspnea on exertion   . Dysthymic   . GERD (gastroesophageal reflux disease)   . Hypertension   . Leg edema    Asymetric Right leg (dopplers negative)  . Overweight (BMI 25.0-29.9)   . Stroke (HCC)   . Urination frequency     Patient Active Problem List   Diagnosis Date Noted  . Burning chest pain 09/18/2019  . Primary osteoarthritis of right knee 07/09/2018  . Pain of left hip joint 07/09/2018  . Lightheadedness 04/23/2018  . Gait instability 01/08/2018  . Chronic migraine without aura without status migrainosus, not intractable 11/13/2017  . Tension headache 11/13/2017  . Lumbosacral radiculopathy 11/12/2017  .  Cerebrovascular accident (CVA) (HCC) 11/08/2017  . Spinal stenosis of lumbar region 11/08/2017  . Routine adult health maintenance 02/13/2016  . Syncope 11/15/2015  . Edema of right foot 01/27/2015  . Intertrigo 12/30/2014  . Stable angina (HCC) 09/23/2014  . Hot flashes 11/27/2013  . Hair loss 09/10/2013  . Depression 03/18/2013  . Dysuria 06/24/2012  . 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 OVERWEIGHT 05/05/2009  . CONSTIPATION, CHRONIC 02/10/2009  . DYSTHYMIC DISORDER 02/06/2008  . GERD 02/06/2008  . HYPERTENSION, BENIGN 10/09/2006  . Chronic low back pain 10/09/2006    Past Surgical History:  Procedure Laterality Date  . 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 BILATERAL SALPINGOOPHORECTOMY  05/08/2001   Path=extensive endometriosis  . BREAST SURGERY  12/29/2003   Right breast core biopsy=benign fibrocystic change  . SMALL INTESTINE SURGERY  03/30/2001   Rectosigmoid bowel resection and reanastomosis     OB History   No obstetric history on file.     Family History  Problem Relation Age of Onset  . Hypertension Mother   . Diabetes Father   . Diabetes Sister   . Asthma Brother   . Diabetes Brother   . Kidney disease Brother   . Colon cancer Neg Hx   . Colon polyps Neg Hx   . Esophageal cancer Neg Hx   . Breast cancer Neg Hx     Social History   Tobacco Use  . Smoking status: Never Smoker  . Smokeless tobacco: Never Used  Substance Use Topics  .  Alcohol use: No    Alcohol/week: 0.0 standard drinks  . Drug use: No    Home Medications Prior to Admission medications   Medication Sig Start Date End Date Taking? Authorizing Provider  acetaminophen (TYLENOL 8 HOUR) 650 MG CR tablet Take 1 tablet (650 mg total) by mouth every 6 (six) hours as needed for pain. 12/16/18   Mirian Mo, MD  aspirin 81 MG tablet Take 1 tablet (81 mg total) by mouth daily. 09/23/14   Glori Luis, MD  atorvastatin (LIPITOR) 40 MG tablet Take 1 tablet (40 mg total) by mouth  daily. 11/14/18   Mirian Mo, MD  baclofen (LIORESAL) 10 MG tablet TAKE 0.5 TABLETS (5 MG TOTAL) BY MOUTH 2 (TWO) TIMES DAILY. 09/06/18   Mirian Mo, MD  carvedilol (COREG) 3.125 MG tablet Take 1 tablet (3.125 mg total) by mouth 2 (two) times daily with a meal. 04/22/19 10/19/19  [name], MD  cimetidine (CIMETIDINE 200) 200 MG tablet Take 1 tablet (200 mg total) by mouth daily. 09/21/19   Meccariello, Solmon Ice, DO  desonide (DESOWEN) 0.05 % cream Apply topically 2 (two) times daily. Patient taking differently: Apply 1 application topically daily. For itching 04/08/13   Hairford, Ricki Miller, MD  diclofenac sodium (VOLTAREN) 1 % GEL Apply 2 g topically 4 (four) times daily. 08/06/18   Mirian Mo, MD  gabapentin (NEURONTIN) 100 MG capsule Take 1 capsule (100 mg total) by mouth 3 (three) times daily as needed. 03/24/19   [name], MD  ibuprofen (ADVIL) 400 MG tablet Take 1 tablet (400 mg total) by mouth every 6 (six) hours as needed. 12/02/18   Mirian Mo, MD  ipratropium (ATROVENT) 0.06 % nasal spray Place 2 sprays into both nostrils 4 (four) times daily. Patient taking differently: Place 2 sprays into both nostrils 4 (four) times daily as needed (seasonal allergies).  09/26/16 03/18/18  Velora Heckler A, MD  nitroGLYCERIN (NITRODUR - DOSED IN MG/24 HR) 0.2 mg/hr patch APPLY 1/4 PATCH TO AFFECTED AREA. CHANGE PATCH EVERY 24 HOURS. Patient taking differently: Place 0.2 mg onto the skin daily.     Hairford, Ricki Miller, MD  olopatadine (PATANOL) 0.1 % ophthalmic solution Place 1 drop into both eyes 2 (two) times daily as needed (seasonal allergies/itching).     [provider]  ondansetron (ZOFRAN ODT) 4 MG disintegrating tablet Take 1 tablet (4 mg total) by mouth every 8 (eight) hours as needed for nausea or vomiting. 09/23/19   Antony Madura, PA-C  ondansetron (ZOFRAN) 8 MG tablet Take 1 tablet (8 mg total) by mouth every 8 (eight) hours as needed for nausea or vomiting. 03/25/18   Winfrey, Harlen Labs, MD  polyethylene glycol (MIRALAX / GLYCOLAX) 17 g packet Take 17 g by mouth 2 (two) times daily. 09/21/19   [name], MD  RABEprazole (ACIPHEX) 20 MG tablet Take 1 tablet (20 mg total) by mouth daily. Brand Name Medically Neccessary. 07/07/19   [name], MD  sucralfate (CARAFATE) 1 g tablet Take 1 tablet (1 g total) by mouth 4 (four) times daily -  with meals and at bedtime. 09/23/19   Antony Madura, PA-C  traMADol (ULTRAM) 50 MG tablet Take 1 tablet (50 mg total) by mouth every 6 (six) hours as needed. 01/02/19   Arthor Captain, PA-C    Allergies    Percocet [oxycodone-acetaminophen] and Codeine phosphate  Review of Systems   Review of Systems  Ten systems reviewed and are negative for acute change, except as noted in  the HPI.    Physical Exam Updated Vital Signs BP 102/84   Pulse 88   Temp 97.8 F (36.6 C) (Oral)   Resp 14   Ht 5\' 1"  (1.549 m)   Wt 81.6 kg   SpO2 98%   BMI 34.01 kg/m   Physical Exam Vitals and nursing note reviewed.  Constitutional:      General: She is not in acute distress.    Appearance: She is well-developed. She is not diaphoretic.     Comments: Nontoxic appearing and in NAD  HENT:     Head: Normocephalic and atraumatic.  Eyes:     General: No scleral icterus.    Conjunctiva/sclera: Conjunctivae normal.  Cardiovascular:     Rate and Rhythm: Normal rate and regular rhythm.     Pulses: Normal pulses.  Pulmonary:     Effort: Pulmonary effort is normal. No respiratory distress.     Breath sounds: No stridor. No wheezing.     Comments: Respirations even and unlabored Abdominal:     Comments: Benign abdominal exam. Soft, obese. No focal TTP or guarding. No peritoneal signs.  Musculoskeletal:        General: Normal range of motion.     Cervical back: Normal range of motion.  Skin:    General: Skin is warm and dry.     Coloration: Skin is not pale.     Findings: No erythema or rash.  Neurological:     Mental Status: She is alert and  oriented to person, place, and time.     Coordination: Coordination normal.  Psychiatric:        Behavior: Behavior normal.     ED Results / Procedures / Treatments   Labs (all labs ordered are listed, but only abnormal results are displayed) Labs Reviewed  BASIC METABOLIC PANEL - Abnormal; Notable for the following components:      Result Value   Potassium 3.4 (*)    Glucose, Bld 121 (*)    All other components within normal limits  CBC - Abnormal; Notable for the following components:   WBC 10.6 (*)    Hemoglobin 11.9 (*)    All other components within normal limits  URINALYSIS, ROUTINE W REFLEX MICROSCOPIC - Abnormal; Notable for the following components:   APPearance HAZY (*)    Protein, ur 100 (*)    Bacteria, UA RARE (*)    All other components within normal limits  TROPONIN I (HIGH SENSITIVITY)  TROPONIN I (HIGH SENSITIVITY)    EKG ED ECG REPORT   Date: 09/23/2019  Rate: 72  Rhythm: normal sinus rhythm  QRS Axis: left  Intervals: normal  ST/T Wave abnormalities: normal  Conduction Disutrbances:none  Narrative Interpretation: NSR; minimal voltage criteria for LVH, may be normal variant  Old EKG Reviewed: none available  I have personally reviewed the EKG tracing and agree with the computerized printout as noted.   Radiology No results found.  Procedures Procedures (including critical care time)  Medications Ordered in ED Medications  sodium chloride flush (NS) 0.9 % injection 3 mL (3 mLs Intravenous Given 09/23/19 0217)  sodium chloride 0.9 % bolus 1,000 mL (0 mLs Intravenous Stopped 09/23/19 0400)  metoCLOPramide (REGLAN) injection 10 mg (10 mg Intravenous Given 09/23/19 0217)  pantoprazole (PROTONIX) injection 40 mg (40 mg Intravenous Given 09/23/19 0216)  alum & mag hydroxide-simeth (MAALOX/MYLANTA) 200-200-20 MG/5ML suspension 30 mL (30 mLs Oral Given 09/23/19 0216)    And  lidocaine (XYLOCAINE) 2 % viscous mouth solution 15  mL (15 mLs Oral Given  09/23/19 0216)    ED Course  I have reviewed the triage vital signs and the nursing notes.  Pertinent labs & imaging results that were available during my care of the patient were reviewed by me and considered in my medical decision making (see chart for details).  Clinical Course as of Sep 22 412  Wed Sep 23, 2019  3329 Patient reporting symptomatic improvement.  She has tolerated Sprite as well as saltine crackers without persistent vomiting.  Is comfortable with plan for discharge.   [KH]    Clinical Course User Index [KH] Antony Madura, PA-C   MDM Rules/Calculators/A&P                      64 year old female presenting for symptoms consistent with likely reflux/gastritis.  She does have an ongoing history of reflux, but developed nausea and vomiting yesterday.  Her symptoms have improved with IV fluids as well as antiemetics, Protonix, GI cocktail.  Now tolerating oral fluids and crackers without difficulty.  She has a benign abdominal exam without peritoneal signs, guarding.  Vital signs stable without fever.  Plan for discharge on course of Carafate.  Patient reports having follow-up with GI this coming week.  She is able to follow-up with her primary care doctor in the interim as needed.  Return precautions discussed and provided. Patient discharged in stable condition with no unaddressed concerns.   Final Clinical Impression(s) / ED Diagnoses Final diagnoses:  Gastroesophageal reflux disease, unspecified whether esophagitis present    Rx / DC Orders ED Discharge Orders         Ordered    sucralfate (CARAFATE) 1 g tablet  3 times daily with meals & bedtime     09/23/19 0348    ondansetron (ZOFRAN ODT) 4 MG disintegrating tablet  Every 8 hours PRN     09/23/19 0349           Antony Madura, PA-C 09/23/19 0435    Geoffery Lyons, MD 09/23/19 [phone]

## 2019-09-24 NOTE — Progress Notes (Signed)
    SUBJECTIVE:   CHIEF COMPLAINT / HPI:   Vestibular neuritis Megan Miller reports that she woke up on Tuesday morning with significant vision changes.  In addition to her vision changes, she believes she is having balance issues and trouble walking.  She has been using a cane to walk since Tuesday.  On Tuesday, she went to the ED to be assessed at which time she was told that she had GERD.  Since Tuesday, she reports that her symptoms are generally improving although she still has significant vision changes balance trouble and trouble walking.  She describes her vision changes as objects in her visual field are abruptly shifting from the center of her visual field to the side.  She reports that the images sometimes appear slanted or tilted.  She does not feel that the room is spinning around her.  She is not aware of any position changes or activities that seem to exacerbate this problem.  PERTINENT  PMH / PSH: Hypertension and previous CVA in the basal ganglia region.  OBJECTIVE:   BP (!) 148/90   Pulse 92   Wt 183 lb (83 kg)   SpO2 96%   BMI 34.58 kg/m    General: 64 year old woman in no acute distress.  Some mild difficulty walking around the exam room and she uses small steps to avoid losing her balance. HEENT: Horizontal nystagmus noted especially with rightward deviation of the eyes.  Head impulse test with an abnormal result with leftward deviation of the head with her pupils deviating from their focus before a rhythmic beating movement brought them back to the appropriate focus point.  ASSESSMENT/PLAN:   Vestibular neuritis Symptoms consistent with vertigo.  Nonepisodic.  Head impulse test indicating that she has a peripheral lesion on the left side.  Low suspicion for central nervous lesion based on history of significant improvement since Tuesday and a physical exam supporting a peripheral pathology.  She was informed that she most likely has a vestibular neuritis which  will likely resolve on its own.  In the meantime, can treat any nausea with Zofran and she was referred to vestibular rehab.     Mirian Mo, MD [facility]

## 2019-09-25 ENCOUNTER — Ambulatory Visit: Payer: Medicaid Other | Admitting: Family Medicine

## 2019-09-25 ENCOUNTER — Other Ambulatory Visit: Payer: Self-pay

## 2019-09-25 VITALS — BP 148/90 | HR 92 | Wt 183.0 lb

## 2019-09-25 DIAGNOSIS — H8120 Vestibular neuronitis, unspecified ear: Secondary | ICD-10-CM | POA: Insufficient documentation

## 2019-09-25 DIAGNOSIS — H8122 Vestibular neuronitis, left ear: Secondary | ICD-10-CM

## 2019-09-25 NOTE — Patient Instructions (Addendum)
It looks like you have an issue called vestibular neuritis.  This is usually caused by inflammation in your inner ear and gets better with time.  I think this is why you are having some vision changes and balance issues.  The best thing we can do for right now is to send you to rehab.  This is called vestibular rehab.  You will get a call in the next week about setting up an appointment.  If you are feeling totally better by the time of the phone call, you do not have to go.  He can also use Zofran at home if you are feeling nauseated.  If you experience improvement in the next week or 2 please come back to clinic.

## 2019-09-25 NOTE — Assessment & Plan Note (Signed)
Symptoms consistent with vertigo.  Nonepisodic.  Head impulse test indicating that she has a peripheral lesion on the left side.  Low suspicion for central nervous lesion based on history of significant improvement since Tuesday and a physical exam supporting a peripheral pathology.  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  In the meantime, can treat any nausea with Zofran and she was referred to vestibular rehab.

## 2019-09-27 NOTE — Progress Notes (Deleted)
09/27/2019 AZLYNN MITNICK [id number] [birth year]   CHIEF COMPLAINT:   HISTORY OF PRESENT ILLNESS:  Tayla L. Welte is a 64 year old female with a past medical history of hypertension, CVA, chronic back pain and GERD. Colon resection.   She presented to to [facility] ER 09/22/2019 with nausea, vomiting, worsening reflux and epigastric pain. Her symptoms improved after receiving IV fluids, antiemetics, Protonix and a GI cocktail. She was discharged home on Sucralfate 1gm po tid and Ondansetron 4mg  one tab Q 8hrs PRN.   She was seen by PCP Dr. on 2/26 with complaints of vision changes and balance issues.  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  In the meantime, can treat any nausea with Zofran and she was referred to vestibular rehab  CBC Latest Ref Rng & Units 09/22/2019 11/21/2018 03/19/2018  WBC 4.0 - 10.5 K/uL 10.6(H) 8.2 15.8(H)  Hemoglobin 12.0 - 15.0 g/dL 11.9(L) 11.7(L) 10.7(L)  Hematocrit 36.0 - 46.0 % 37.3 36.9 34.3(L)  Platelets 150 - 400 K/uL 313 278 291   CMP Latest Ref Rng & Units 09/22/2019 03/11/2019 11/21/2018  Glucose 70 - 99 mg/dL 11/23/2018) 93 97  BUN 8 - 23 mg/dL 9 10 9   Creatinine 0.44 - 1.00 mg/dL 415(A 3.09  Sodium 135 - 145 mmol/L 138 142 140  Potassium 3.5 - 5.1 mmol/L 3.4(L) 3.8 3.4(L)  Chloride 98 - 111 mmol/L 104 106 106  CO2 22 - 32 mmol/L 23 21 21(L)  Calcium 8.9 - 10.3 mg/dL 9.0 9.3 9.0  Total Protein 6.5 - 8.1 g/dL - - 7.2  Total Bilirubin 0.3 - 1.2 mg/dL - - 0.8  Alkaline Phos 38 - 126 U/L - - 75  AST 15 - 41 U/L - - 19  ALT 0 - 44 U/L - - 14    ECHO 07/12/2017: Left ventricle: Systolic function was normal. The estimated ejection  fraction was in the range of 60% to 65%. Grade 2 diastolic dysfunction.   EGD 01/12/2015: Dr. 07/14/2017:  Normal appearing esophagus and GE junction, the stomach was well visualized and normal in appearance, normal appearing  duodenum  Colonoscopy 08/23/2011  by Dr. Rhea Belton:  External and internal hemorrhoids. A single small mouthed diverticulum was found in the recto-sigmoid colon. Bowel prep adequate.   Colonoscopy by Dr. 08/25/2011 07/09/2003: Prior surgery: Sigmoid colon. Segmental colectomy. Side to side anastomosis.       Past Medical History:  Diagnosis Date  . Allergic conjunctivitis   . Chest pain   . Chronic back pain   . Constipation, chronic   . Dyspnea on exertion   . Dysthymic   . GERD (gastroesophageal reflux disease)   . Hypertension   . Leg edema    Asymetric Right leg (dopplers negative)  . Overweight (BMI 25.0-29.9)   . Stroke (HCC)   . Urination frequency    Past Surgical History:  Procedure Laterality Date  . 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 BILATERAL SALPINGOOPHORECTOMY  05/08/2001   Path=extensive endometriosis  . BREAST SURGERY  12/29/2003   Right breast core biopsy=benign fibrocystic change  . SMALL INTESTINE SURGERY  03/30/2001   Rectosigmoid bowel resection and reanastomosis    reports that she has never smoked. She has never used smokeless tobacco. She reports that she does not drink alcohol or use drugs. family history includes Asthma in her brother; Diabetes in her brother, father,  and sister; Hypertension in her mother; Kidney disease in her brother. Allergies  Allergen Reactions  . Percocet [Oxycodone-Acetaminophen] Nausea And Vomiting  . Codeine Phosphate Itching      Outpatient Encounter Medications as of 09/28/2019  Medication Sig  . acetaminophen (TYLENOL 8 HOUR) 650 MG CR tablet Take 1 tablet (650 mg total) by mouth every 6 (six) hours as needed for pain.  Marland Kitchen aspirin 81 MG tablet Take 1 tablet (81 mg total) by mouth daily.  Marland Kitchen atorvastatin (LIPITOR) 40 MG tablet Take 1 tablet (40 mg total) by mouth daily.  . baclofen (LIORESAL) 10 MG tablet TAKE 0.5 TABLETS (5 MG TOTAL) BY MOUTH 2 (TWO) TIMES DAILY.  . carvedilol (COREG) 3.125 MG  tablet Take 1 tablet (3.125 mg total) by mouth 2 (two) times daily with a meal.  . cimetidine (CIMETIDINE 200) 200 MG tablet Take 1 tablet (200 mg total) by mouth daily.  Marland Kitchen desonide (DESOWEN) 0.05 % cream Apply topically 2 (two) times daily. (Patient taking differently: Apply 1 application topically daily. For itching)  . diclofenac sodium (VOLTAREN) 1 % GEL Apply 2 g topically 4 (four) times daily.  Marland Kitchen gabapentin (NEURONTIN) 100 MG capsule Take 1 capsule (100 mg total) by mouth 3 (three) times daily as needed.  Marland Kitchen ibuprofen (ADVIL) 400 MG tablet Take 1 tablet (400 mg total) by mouth every 6 (six) hours as needed.  Marland Kitchen ipratropium (ATROVENT) 0.06 % nasal spray Place 2 sprays into both nostrils 4 (four) times daily. (Patient taking differently: Place 2 sprays into both nostrils 4 (four) times daily as needed (seasonal allergies). )  . nitroGLYCERIN (NITRODUR - DOSED IN MG/24 HR) 0.2 mg/hr patch APPLY 1/4 PATCH TO AFFECTED AREA. CHANGE PATCH EVERY 24 HOURS. (Patient taking differently: Place 0.2 mg onto the skin daily. )  . olopatadine (PATANOL) 0.1 % ophthalmic solution Place 1 drop into both eyes 2 (two) times daily as needed (seasonal allergies/itching).   . ondansetron (ZOFRAN ODT) 4 MG disintegrating tablet Take 1 tablet (4 mg total) by mouth every 8 (eight) hours as needed for nausea or vomiting.  . ondansetron (ZOFRAN) 8 MG tablet Take 1 tablet (8 mg total) by mouth every 8 (eight) hours as needed for nausea or vomiting.  . polyethylene glycol (MIRALAX / GLYCOLAX) 17 g packet Take 17 g by mouth 2 (two) times daily.  . RABEprazole (ACIPHEX) 20 MG tablet Take 1 tablet (20 mg total) by mouth daily. Brand Name Medically Neccessary.  . sucralfate (CARAFATE) 1 g tablet Take 1 tablet (1 g total) by mouth 4 (four) times daily -  with meals and at bedtime.  . traMADol (ULTRAM) 50 MG tablet Take 1 tablet (50 mg total) by mouth every 6 (six) hours as needed.   No facility-administered encounter medications on  file as of 09/28/2019.     REVIEW OF SYSTEMS: All other systems reviewed and negative except where noted in the History of Present Illness.   PHYSICAL EXAM: There were no vitals taken for this visit. General: Well developed ... in no acute distress. Head: Normocephalic and atraumatic. Eyes:  Sclerae non-icteric, conjunctive pink. Ears: Normal auditory acuity. Mouth: Dentition intact. No ulcers or lesions.  Neck: Supple, no lymphadenopathy or thyromegaly.  Lungs: Clear bilaterally to auscultation without wheezes, crackles or rhonchi. Heart: Regular rate and rhythm. No murmur, rub or gallop appreciated.  Abdomen: Soft, nontender, non distended. No masses. No hepatosplenomegaly. Normoactive bowel sounds x 4 quadrants.  Rectal:  Musculoskeletal: Symmetrical with no gross deformities. Skin: Warm  and dry. No rash or lesions on visible extremities. Extremities: No edema. Neurological: Alert oriented x 4, no focal deficits.  Psychological:  Alert and cooperative. Normal mood and affect.  ASSESSMENT AND PLAN:    CC:  Lyndee Hensen, DO

## 2019-09-28 ENCOUNTER — Ambulatory Visit: Payer: Medicaid Other | Admitting: Nurse Practitioner

## 2019-10-12 ENCOUNTER — Telehealth: Payer: Self-pay

## 2019-10-12 NOTE — Addendum Note (Signed)
Addended by: Jone Baseman D on: 10/12/2019 04:29 PM   Modules accepted: Orders

## 2019-10-12 NOTE — Telephone Encounter (Signed)
Ms. Navarrete was contacted by telephone.  We discussed that her initial dizziness did improve in the 2 days following onset of symptoms and has now persisted at a relatively low level.  She reports that her current appointment is not until August 12 with a vestibular rehab.  She was encouraged to call and schedule a sooner appointment if possible and to return to clinic if she had significant symptoms that persisted following rehab.  Mirian Mo, MD

## 2019-10-12 NOTE — Telephone Encounter (Signed)
Patient calls nurse line regarding continuance of dizziness when moving head and changing positions. Patient saw Dr. Homero Fellers 2/26, order was placed for vestibular rehab, however, order was not placed as referral. Jone Baseman, CMA corrected order and placed as referral and processed for the [facility]. Spoke with staff member who stated that they did receive the referral and to inform patient to give office a phone call for faster scheduling.   Patient informed and given phone number to make appointment.   To referring doctor and PCP as Mckinley Jewel, RN

## 2019-10-21 ENCOUNTER — Ambulatory Visit: Payer: Medicaid Other | Attending: Family Medicine | Admitting: Physical Therapy

## 2019-10-21 ENCOUNTER — Ambulatory Visit: Payer: Medicaid Other | Admitting: Physical Therapy

## 2019-10-21 ENCOUNTER — Ambulatory Visit: Payer: Medicaid Other | Admitting: Physician Assistant

## 2019-10-21 ENCOUNTER — Other Ambulatory Visit: Payer: Self-pay

## 2019-10-21 DIAGNOSIS — R2681 Unsteadiness on feet: Secondary | ICD-10-CM | POA: Diagnosis present

## 2019-10-21 DIAGNOSIS — R42 Dizziness and giddiness: Secondary | ICD-10-CM | POA: Diagnosis present

## 2019-10-21 DIAGNOSIS — R262 Difficulty in walking, not elsewhere classified: Secondary | ICD-10-CM | POA: Diagnosis present

## 2019-10-21 NOTE — Therapy (Signed)
[facility] [address] [place], [place], [address] Phone: [phone]   Fax:  [phone]  Physical Therapy Evaluation  Patient Details  Name: Megan LloydVickie L Thammavong MRN: [id number] Date of Birth: 07/31/1955 Referring Provider (PT): Mirian MoFrank, Peter, MD - Resident - cosigned by Moses MannersHensel, William A, MD   Encounter Date: 10/21/2019  PT End of Session - 10/21/19 2122    Visit Number  1    Number of Visits  12    Authorization Type  Medicaid - awaiting approval of first 3 visits    Authorization - Visit Number  0    Authorization - Number of Visits  3    PT Start Time  1330    PT Stop Time  1408    PT Time Calculation (min)  38 min    Activity Tolerance  Patient tolerated treatment well    Behavior During Therapy  [facility] for tasks assessed/performed       Past Medical History:  Diagnosis Date  . Allergic conjunctivitis   . Chest pain   . Chronic back pain   . Constipation, chronic   . Dyspnea on exertion   . Dysthymic   . GERD (gastroesophageal reflux disease)   . Hypertension   . Leg edema    Asymetric Right leg (dopplers negative)  . Overweight (BMI 25.0-29.9)   . Stroke (HCC)   . Urination frequency     Past Surgical History:  Procedure Laterality Date  . 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 BILATERAL SALPINGOOPHORECTOMY  05/08/2001   Path=extensive endometriosis  . BREAST SURGERY  12/29/2003   Right breast core biopsy=benign fibrocystic change  . SMALL INTESTINE SURGERY  03/30/2001   Rectosigmoid bowel resection and reanastomosis    There were no vitals filed for this visit.   Subjective Assessment - 10/21/19 1339    Subjective  Pt referred to vestibular rehab for vestibular neuronitis.  Pt reports getting a headache after dizziness, lasts about 15 minutes.  Pt is getting dizzy every day - doesn't wake up with it, it starts when she is moving around.  Dizziness comes and goes and it feels  like spinning.  Denies changes in hearing, tinnitus or aural fullness.  Pt gets blurry vision at times.    Pertinent History  allergic conjunctivitis, chest pain, chronic back pain, DOE, HTN, LE edema, CVA - Old RIGHT basal ganglia, bilateral thalamus and bilateralcerebellar small infarcts; went to ED 2 years ago for sudden onset dizziness/vomiting    Patient Stated Goals  To stop the dizziness    Currently in Pain?  No/denies         [facility] - 10/21/19 1342      Assessment   Medical Diagnosis  Vestibular Neuronitis    Referring Provider (PT)  Mirian MoFrank, Peter, MD - Resident - cosigned by Moses MannersHensel, William A, MD    Onset Date/Surgical Date  10/12/19    Prior Therapy  not for dizziness      Precautions   Precautions  Other (comment)    Precaution Comments  allergic conjunctivitis, chest pain, chronic back pain, DOE, HTN, LE edema, CVA - Old RIGHT basal ganglia, bilateral thalamus and bilateralcerebellar small infarcts; went to ED 2 years ago for sudden onset dizziness/vomiting      Balance Screen   Has the patient fallen in the past 6 months  No    Has the patient had a decrease in activity level because of a fear  of falling?   Yes      Home Environment   Living Environment  Private residence    Living Arrangements  Alone    Type of Home  House    Home Access  Stairs to enter    Home Layout  One level    Home Equipment  [place] - single point    Additional Comments  before dizziness pt did not use a cane; is still driving      Prior Function   Level of Independence  Independent      Observation/Other Assessments   Focus on Therapeutic Outcomes (FOTO)   Not performed      Sensation   Light Touch  Appears Intact      ROM / Strength   AROM / PROM / Strength  Strength      Strength   Overall Strength  Deficits    Overall Strength Comments  RLE: 5/5, LLE: 4/5      Ambulation/Gait   Ambulation/Gait  Yes    Ambulation/Gait Assistance  5: Supervision    Ambulation/Gait  Assistance Details  veering to the R     Ambulation Distance (Feet)  115 Feet    Assistive device  Straight cane    Gait Pattern  Step-through pattern   veering to R   Ambulation Surface  Level;Indoor           Vestibular Assessment - 10/21/19 1346      Symptom Behavior   Subjective history of current problem  Started last month - not preceeded by any illness.  Had COVID vaccine but dizziness started before vaccine; pt states she has never had dizziness before but past encounters document an ED visit in 2019 for sudden onset dizziness and vomiting - no significant findings on CT    Type of Dizziness   Spinning    Frequency of Dizziness  daily    Duration of Dizziness  seconds    Symptom Nature  Motion provoked    Aggravating Factors  Turning body quickly;Turning head quickly;Rolling to right;Rolling to left;Supine to sit;Lying supine    Relieving Factors  Comments   eating something   Progression of Symptoms  No change since onset    History of similar episodes  this is the first time she has had dizziness      Oculomotor Exam   Oculomotor Alignment  Normal    Ocular ROM  WFL    Spontaneous  Absent    Gaze-induced   Right beating nystagmus with R gaze    Smooth Pursuits  Intact    Saccades  Undershoots;Poor trajectory    Comment  + test of skew for exophoria; history of CVA      Oculomotor Exam-Fixation Suppressed    Left Head Impulse  difficult to assess - pt guards neck and unable to keep eyes on target; appeared to be + to L    Right Head Impulse  difficult to assess - pt guards neck and unable to keep eyes on target      Vestibulo-Ocular Reflex   VOR to Slow Head Movement  Comment   abnormal, unable to maintain gaze   VOR Cancellation  Unable to maintain gaze    Comment  history of CVA      Positional Testing   Dix-Hallpike  Dix-Hallpike Right;Dix-Hallpike Left    Horizontal Canal Testing  Horizontal Canal Right;Horizontal Canal Left      Dix-Hallpike Right    Dix-Hallpike Right Duration  >60  seconds    Dix-Hallpike Right Symptoms  Right nystagmus      Dix-Hallpike Left   Dix-Hallpike Left Duration  >60 seconds    Dix-Hallpike Left Symptoms  Right nystagmus      Horizontal Canal Right   Horizontal Canal Right Duration  >60 seconds    Horizontal Canal Right Symptoms  Geotrophic      Horizontal Canal Left   Horizontal Canal Left Duration  >60 seconds    Horizontal Canal Left Symptoms  Ageotrophic          Objective measurements completed on examination: See above findings.              PT Education - 10/21/19 2122    Education Details  clinical findings, PT POC and goals    Person(s) Educated  Patient    Methods  Explanation    Comprehension  Verbalized understanding       PT Short Term Goals - 10/21/19 2141      PT SHORT TERM GOAL #1   Title  Pt will participate in further vestibular and balance assessment: DVA and FGA    Baseline  not assessed to date    Time  4    Period  Weeks    Status  New      PT SHORT TERM GOAL #2   Title  Pt will demonstrate independence with initial vestibular and balance HEP    Baseline  dependent to date    Time  4    Period  Weeks    Status  New      PT SHORT TERM GOAL #3   Title  Pt will report no dizziness with rolling in bed or during supine <> sit    Baseline  mild-moderate dizziness with bed mobility    Time  4    Period  Weeks    Status  New      PT SHORT TERM GOAL #4   Title  Pt will ambulate x 230' indoors with L and R turns with cane and no veering to R    Baseline  115' with cane with significant veering to R    Time  4    Period  Weeks    Status  New        PT Long Term Goals - 10/21/19 2145      PT LONG TERM GOAL #1   Title  Pt will be independent with final vestibular and balance HEP    Baseline  dependent to date    Time  6    Period  Weeks      PT LONG TERM GOAL #2   Title  Pt will demonstrate improved use of VOR as indicated by 2-3 line difference  on DVA    Baseline  not assessed to date    Time  6    Period  Weeks    Status  New      PT LONG TERM GOAL #3   Title  Pt will demonstrate 4 point improvement in FGA to indicate decreased falls risk    Baseline  not assessed to date    Time  6    Period  Weeks    Status  New      PT LONG TERM GOAL #4   Title  Pt will ambulate x 230' indoors with L and R turns WITHOUT CANE, independently without veering to R    Baseline  115' with cane with significant veering  to R    Time  6    Period  Weeks    Status  New             Plan - 10/21/19 2123    Clinical Impression Statement  Pt is a 64 year old female referred to Neuro OPPT for evaluation of dizziness and vestibular neuronitis.  Pt's PMH is significant for the following: allergic conjunctivitis, chest pain, chronic back pain, DOE, HTN, LE edema, CVA - Old RIGHT basal ganglia, bilateral thalamus and bilateral cerebellar small infarcts; went to ED 2 years ago for sudden onset dizziness/vomiting. The following deficits were noted during pt's exam: LLE weakness, impaired oculomotor exam, impaired VOR with + HIT to the L, gaze holding nystagmus to R and persistent R beating nystagmus during positional testing indicating L vestibular hypofunction, impaired balance and impaired gait with veering to the R placing patient at high risk for falls. Pt would benefit from skilled PT to address these impairments and functional limitations to maximize functional mobility independence and reduce falls risk.    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 ganglia, bilateral thalamus and bilateralcerebellar small infarcts; went to ED 2 years ago for sudden onset dizziness/vomiting    Examination-Activity Limitations  Bed Mobility;Locomotion Level    Examination-Participation Restrictions  Driving     Stability/Clinical Decision Making  Stable/Uncomplicated    Clinical Decision Making  Low    Rehab Potential  Good    PT Frequency  1x / week    PT Duration  Other (comment)   1x/week x 3 + 1x/week x 6   PT Treatment/Interventions  ADLs/Self Care Home Management;Canalith Repostioning;DME Instruction;Gait training;Stair training;Functional mobility training;Therapeutic activities;Therapeutic exercise;Balance training;Neuromuscular re-education;Patient/family education;Vestibular    PT Next Visit Plan  assess DVA, FGA.  Initiate x1 viewing and vestibular HEP    Consulted and Agree with Plan of Care  Patient       Patient will benefit from skilled therapeutic intervention in order to improve the following deficits and impairments:  Decreased balance, Decreased strength, Dizziness, Difficulty walking  Visit Diagnosis: Dizziness and giddiness  Unsteadiness on feet  Difficulty in walking, not elsewhere classified     Problem List Patient Active Problem List   Diagnosis Date Noted  . 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 Burning chest pain 09/18/2019  . Primary osteoarthritis of right knee 07/09/2018  . Pain of left hip joint 07/09/2018  . Lightheadedness 04/23/2018  . Gait instability 01/08/2018  . Chronic migraine without aura without status migrainosus, not intractable 11/13/2017  . Tension headache 11/13/2017  . Lumbosacral radiculopathy 11/12/2017  . Cerebrovascular accident (CVA) ([place]) 11/08/2017  . Spinal stenosis of lumbar region 11/08/2017  . Routine adult health maintenance 02/13/2016  . Syncope 11/15/2015  . Edema of right foot 01/27/2015  . Intertrigo 12/30/2014  . Stable angina ([place]) 09/23/2014  . Hot flashes 11/27/2013  . Hair loss 09/10/2013  . Depression 03/18/2013  . Dysuria 06/24/2012  . 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 OVERWEIGHT 05/05/2009  . CONSTIPATION, CHRONIC 02/10/2009  . DYSTHYMIC DISORDER 02/06/2008  . GERD 02/06/2008  . HYPERTENSION, BENIGN 10/09/2006  .  Chronic low back pain 10/09/2006    Rico Junker 10/21/2019, 9:49 PM  [place] [address] [place], [place], [address] Phone: [phone]   Fax:  [phone]  Name: SUZETTE FLAGLER MRN: [id number] Date  of Birth: 04/20/1956

## 2019-11-05 ENCOUNTER — Other Ambulatory Visit: Payer: Self-pay

## 2019-11-05 ENCOUNTER — Ambulatory Visit: Payer: Medicaid Other | Attending: Family Medicine | Admitting: Physical Therapy

## 2019-11-05 ENCOUNTER — Encounter: Payer: Self-pay | Admitting: Physical Therapy

## 2019-11-05 DIAGNOSIS — R2681 Unsteadiness on feet: Secondary | ICD-10-CM | POA: Diagnosis present

## 2019-11-05 DIAGNOSIS — R29898 Other symptoms and signs involving the musculoskeletal system: Secondary | ICD-10-CM | POA: Insufficient documentation

## 2019-11-05 DIAGNOSIS — R42 Dizziness and giddiness: Secondary | ICD-10-CM

## 2019-11-05 DIAGNOSIS — M6281 Muscle weakness (generalized): Secondary | ICD-10-CM | POA: Insufficient documentation

## 2019-11-05 DIAGNOSIS — R293 Abnormal posture: Secondary | ICD-10-CM | POA: Insufficient documentation

## 2019-11-05 DIAGNOSIS — R262 Difficulty in walking, not elsewhere classified: Secondary | ICD-10-CM

## 2019-11-05 MED ORDER — POLYETHYLENE GLYCOL 3350 17 G PO PACK: 17.0000 g | PACK | Freq: Two times a day (BID) | ORAL | 0 refills | Status: DC

## 2019-11-05 NOTE — Therapy (Signed)
[facility] [address] [place], [place], [address] Phone: [phone]   Fax:  [phone]  Physical Therapy Treatment  Patient Details  Name: Megan Miller MRN: [id number] Date of Birth: Dec 21, 1955 Referring Provider (PT): Mirian Mo, MD - Resident - cosigned by Moses Manners, MD   Encounter Date: 11/05/2019  PT End of Session - 11/05/19 1601    Visit Number  2    Number of Visits  12    Authorization Type  Medicaid - awaiting approval of first 3 visits    Authorization Time Period  11-05-19 - 11-25-19    Authorization - Visit Number  1    Authorization - Number of Visits  3    PT Start Time  1445    PT Stop Time  1530    PT Time Calculation (min)  45 min    Activity Tolerance  Patient tolerated treatment well    Behavior During Therapy  [facility] for tasks assessed/performed       Past Medical History:  Diagnosis Date  . Allergic conjunctivitis   . Chest pain   . Chronic back pain   . Constipation, chronic   . Dyspnea on exertion   . Dysthymic   . GERD (gastroesophageal reflux disease)   . Hypertension   . Leg edema    Asymetric Right leg (dopplers negative)  . Overweight (BMI 25.0-29.9)   . Stroke (HCC)   . Urination frequency     Past Surgical History:  Procedure Laterality Date  . 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 BILATERAL SALPINGOOPHORECTOMY  05/08/2001   Path=extensive endometriosis  . BREAST SURGERY  12/29/2003   Right breast core biopsy=benign fibrocystic change  . SMALL INTESTINE SURGERY  03/30/2001   Rectosigmoid bowel resection and reanastomosis    There were no vitals filed for this visit.  Subjective Assessment - 11/05/19 1448    Subjective  Pt reports things are about the same since initial eval last week; states she gets dizzy if she turns her head; also reports dizziness with lying down to sitting up; pt states she really wants to walk without the cane     Pertinent History  allergic conjunctivitis, chest pain, chronic back pain, DOE, HTN, LE edema, CVA - Old RIGHT basal ganglia, bilateral thalamus and bilateralcerebellar small infarcts; went to ED 2 years ago for sudden onset dizziness/vomiting    Patient Stated Goals  To stop the dizziness    Currently in Pain?  Yes    Pain Score  7     Pain Location  Leg    Pain Orientation  Left;Right    Pain Descriptors / Indicators  Aching    Pain Type  Chronic pain    Pain Onset  More than a month ago    Pain Frequency  Intermittent    Aggravating Factors   no specific aggravating factors    Pain Relieving Factors  medication helps         [facility] PT Assessment - 11/05/19 1501      Functional Gait  Assessment   Gait assessed   Yes    Gait Level Surface  Walks 20 ft in less than 7 sec but greater than 5.5 sec, uses assistive device, slower speed, mild gait deviations, or deviates 6-10 in outside of the 12 in walkway width.   6.29 secs with cane   Change in Gait Speed  Makes only minor adjustments to walking speed, or  accomplishes a change in speed with significant gait deviations, deviates 10-15 in outside the 12 in walkway width, or changes speed but loses balance but is able to recover and continue walking.    Gait with Horizontal Head Turns  Performs head turns with moderate changes in gait velocity, slows down, deviates 10-15 in outside 12 in walkway width but recovers, can continue to walk.   used SPC - did not want to attempt without SPC   Gait with Vertical Head Turns  Performs task with moderate change in gait velocity, slows down, deviates 10-15 in outside 12 in walkway width but recovers, can continue to walk.    Gait and Pivot Turn  Turns slowly, requires verbal cueing, or requires several small steps to catch balance following turn and stop    Step Over Obstacle  Is able to step over one shoe box (4.5 in total height) but must slow down and adjust steps to clear box safely. May require verbal  cueing.    Gait with Narrow Base of Support  Ambulates less than 4 steps heel to toe or cannot perform without assistance.    Gait with Eyes Closed  Cannot walk 20 ft without assistance, severe gait deviations or imbalance, deviates greater than 15 in outside 12 in walkway width or will not attempt task.    Ambulating Backwards  Walks 20 ft, uses assistive device, slower speed, mild gait deviations, deviates 6-10 in outside 12 in walkway width.    Steps  Two feet to a stair, must use rail.    Total Score  9    FGA comment:  pt          Vestibular Assessment - 11/05/19 1517      Visual Acuity   Static  line 8    Dynamic  line 3   no significant c/o dizziness after testing               Vestibular Treatment/Exercise - 11/05/19 0001      Vestibular Treatment/Exercise   Gaze Exercises  X1 Viewing Horizontal;X1 Viewing Vertical      X1 Viewing Horizontal   Foot Position  bil. stance    Time  --   60 secs   Reps  1    Comments  plain background - pt in standing position      X1 Viewing Vertical   Foot Position  bil. stance    Time  --   60 secs    Reps  1    Comments  plain background - pt in standing position         Balance Exercises - 11/05/19 1559      Balance Exercises: Standing   Standing Eyes Opened  Narrow base of support (BOS);Wide (BOA);Head turns;Foam/compliant surface;5 reps    Standing Eyes Closed  Narrow base of support (BOS);Wide (BOA);Head turns;Foam/compliant surface;5 reps    SLS  Eyes open;Solid surface;1 rep   10 sec hold attempted on each foot       PT Education - 11/05/19 1601    Education Details  pt instructed in x1 viewing, balance on foam with EO and EC and SLS for HEP    Person(s) Educated  Patient    Methods  Explanation;Demonstration;Handout    Comprehension  Verbalized understanding;Returned demonstration       PT Short Term Goals - 11/05/19 1606      PT SHORT TERM GOAL #1   Title  Pt will participate in further  vestibular  and balance assessment: DVA and FGA    Baseline  not assessed to date    Time  4    Period  Weeks    Status  New      PT SHORT TERM GOAL #2   Title  Pt will demonstrate independence with initial vestibular and balance HEP    Baseline  dependent to date    Time  4    Period  Weeks    Status  New      PT SHORT TERM GOAL #3   Title  Pt will report no dizziness with rolling in bed or during supine <> sit    Baseline  mild-moderate dizziness with bed mobility    Time  4    Period  Weeks    Status  New      PT SHORT TERM GOAL #4   Title  Pt will ambulate x 230' indoors with L and R turns with cane and no veering to R    Baseline  115' with cane with significant veering to R    Time  4    Period  Weeks    Status  New        PT Long Term Goals - 11/05/19 1606      PT LONG TERM GOAL #1   Title  Pt will be independent with final vestibular and balance HEP    Baseline  dependent to date    Time  6    Period  Weeks      PT LONG TERM GOAL #2   Title  Pt will demonstrate improved use of VOR as indicated by 2-3 line difference on DVA    Baseline  not assessed to date    Time  6    Period  Weeks    Status  New      PT LONG TERM GOAL #3   Title  Pt will demonstrate 4 point improvement in FGA to indicate decreased falls risk    Baseline  not assessed to date    Time  6    Period  Weeks    Status  New      PT LONG TERM GOAL #4   Title  Pt will ambulate x 230' indoors with L and R turns WITHOUT CANE, independently without veering to R    Baseline  115' with cane with significant veering to R    Time  6    Period  Weeks    Status  New            Plan - 11/05/19 1602    Clinical Impression Statement  Pt's FGA score = 9/30, indicative of high fall risk, however, score is lower due to pt using SPC during test and declining to attempt any gait activities without use of SPC.  Pt's DVA is 5 line difference from SVA, however, pt did not report significantly increased  dizziness after test completed.    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 ganglia, bilateral thalamus and bilateralcerebellar small infarcts; went to ED 2 years ago for sudden onset dizziness/vomiting    Examination-Activity Limitations  Bed Mobility;Locomotion Level    Examination-Participation Restrictions  Driving    Stability/Clinical Decision Making  Stable/Uncomplicated    Rehab Potential  Good    PT Frequency  1x / week  PT Duration  Other (comment)   1x/week x 3 + 1x/week x 6   PT Treatment/Interventions  ADLs/Self Care Home Management;Canalith Repostioning;DME Instruction;Gait training;Stair training;Functional mobility training;Therapeutic activities;Therapeutic exercise;Balance training;Neuromuscular re-education;Patient/family education;Vestibular    PT Next Visit Plan  check HEP - continue with visual/vestibular exs. - gait train without device as pt tolerates    PT Home Exercise Plan  x1 viewing, balance on foam, SLS    Consulted and Agree with Plan of Care  Patient       Patient will benefit from skilled therapeutic intervention in order to improve the following deficits and impairments:  Decreased balance, Decreased strength, Dizziness, Difficulty walking  Visit Diagnosis: Dizziness and giddiness  Unsteadiness on feet  Difficulty in walking, not elsewhere classified     Problem List Patient Active Problem List   Diagnosis Date Noted  . 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 Burning chest pain 09/18/2019  . Primary osteoarthritis of right knee 07/09/2018  . Pain of left hip joint 07/09/2018  . Lightheadedness 04/23/2018  . Gait instability 01/08/2018  . Chronic migraine without aura without status migrainosus, not intractable 11/13/2017  . Tension headache 11/13/2017  . Lumbosacral radiculopathy 11/12/2017   . Cerebrovascular accident (CVA) (HCC) 11/08/2017  . Spinal stenosis of lumbar region 11/08/2017  . Routine adult health maintenance 02/13/2016  . Syncope 11/15/2015  . Edema of right foot 01/27/2015  . Intertrigo 12/30/2014  . Stable angina (HCC) 09/23/2014  . Hot flashes 11/27/2013  . Hair loss 09/10/2013  . Depression 03/18/2013  . Dysuria 06/24/2012  . 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 OVERWEIGHT 05/05/2009  . CONSTIPATION, CHRONIC 02/10/2009  . DYSTHYMIC DISORDER 02/06/2008  . GERD 02/06/2008  . HYPERTENSION, BENIGN 10/09/2006  . Chronic low back pain 10/09/2006    Kary Kos, PT 11/05/2019, 4:08 PM  [facility] [address] [place], [place], [address] Phone: [phone]   Fax:  [phone]  Name: Megan Miller MRN: [id number] Date of Birth: 10-Nov-1955

## 2019-11-05 NOTE — Patient Instructions (Signed)
SINGLE LIMB STANCE    Stance: single leg on floor. Raise leg. Hold _10_ seconds. Repeat with other leg. __2_ reps per set, 2 sets per day, _5__ days per week     Gaze Stabilization: Tip Card  1.Target must remain in focus, not blurry, and appear stationary while head is in motion. 2.Perform exercises with small head movements (45 to either side of midline). 3.Increase speed of head motion so long as target is in focus. 4.If you wear eyeglasses, be sure you can see target through lens (therapist will give specific instructions for bifocal / progressive lenses). 5.These exercises may provoke dizziness or nausea. Work through these symptoms. If too dizzy, slow head movement slightly. Rest between each exercise. 6.Exercises demand concentration; avoid distractions. 7.For safety, perform standing exercises close to a counter, wall, corner, or next to someone.     Gaze Stabilization: Standing Feet Apart    Feet shoulder width apart, keeping eyes on target on wall _6___ feet away, tilt head down 15-30 and move head side to side for _60___ seconds. Repeat while moving head up and down for _60___ seconds. Do _3___ sessions per day.    Feet Apart (Compliant Surface) Head Motion - Eyes Closed    Stand on compliant surface: _pillows_______ with feet shoulder width apart. Close eyes and move head slowly, up and down. Repeat __1__ times per session. Do _1___ sessions per day.  Feet Together (Compliant Surface) Head Motion - Eyes Closed    Stand on compliant surface: __pillows______ with feet together. Close eyes and move head slowly, up and down. Repeat _1___ times per session. Do __1__ sessions per day.

## 2019-11-09 ENCOUNTER — Ambulatory Visit: Payer: Medicaid Other | Admitting: Physical Therapy

## 2019-11-11 ENCOUNTER — Encounter: Payer: Self-pay | Admitting: Physician Assistant

## 2019-11-11 ENCOUNTER — Ambulatory Visit (INDEPENDENT_AMBULATORY_CARE_PROVIDER_SITE_OTHER): Payer: Medicaid Other | Admitting: Physician Assistant

## 2019-11-11 VITALS — BP 144/88 | HR 80 | Temp 98.2°F | Ht 61.0 in | Wt 184.0 lb

## 2019-11-11 DIAGNOSIS — R1319 Other dysphagia: Secondary | ICD-10-CM | POA: Diagnosis not present

## 2019-11-11 DIAGNOSIS — K219 Gastro-esophageal reflux disease without esophagitis: Secondary | ICD-10-CM

## 2019-11-11 DIAGNOSIS — K5909 Other constipation: Secondary | ICD-10-CM

## 2019-11-11 MED ORDER — POLYETHYLENE GLYCOL 3350 17 GM/SCOOP PO POWD: ORAL | 3 refills | Status: DC

## 2019-11-11 MED ORDER — RABEPRAZOLE SODIUM 20 MG PO TBEC: 20.0000 mg | DELAYED_RELEASE_TABLET | Freq: Every day | ORAL | 11 refills | Status: DC

## 2019-11-11 NOTE — Patient Instructions (Signed)
We have sent the following medications to your pharmacy for you to pick up at your convenience: Aciphex and Miralax   You have been scheduled for an abdominal ultrasound at [facility] (1st floor of hospital) on 11/25/2019 at 9:30am. Please arrive 15 minutes prior to your appointment for registration. Make certain not to have anything to eat or drink 6 hours prior to your appointment. Should you need to reschedule your appointment, please contact radiology at [phone]. This test typically takes about 30 minutes to perform.  You have been scheduled for a Barium Esophogram at [facility] (1st floor of the hospital) on 11/25/2019 at 10:30am. Please arrive 15 minutes prior to your appointment for registration. Make certain not to have anything to eat or drink 3 hours prior to your test. If you need to reschedule for any reason, please contact radiology at [phone] to do so. __________________________________________________________________ A barium swallow is an examination that concentrates on views of the esophagus. This tends to be a double contrast exam (barium and two liquids which, when combined, create a gas to distend the wall of the oesophagus) or single contrast (non-ionic iodine based). The study is usually tailored to your symptoms so a good history is essential. Attention is paid during the study to the form, structure and configuration of the esophagus, looking for functional disorders (such as aspiration, dysphagia, achalasia, motility and reflux) EXAMINATION You may be asked to change into a gown, depending on the type of swallow being performed. A radiologist and radiographer will perform the procedure. The radiologist will advise you of the type of contrast selected for your procedure and direct you during the exam. You will be asked to stand, sit or lie in several different positions and to hold a small amount of fluid in your mouth before being asked to swallow  while the imaging is performed .In some instances you may be asked to swallow barium coated marshmallows to assess the motility of a solid food bolus. The exam can be recorded as a digital or video fluoroscopy procedure. POST PROCEDURE It will take 1-2 days for the barium to pass through your system. To facilitate this, it is important, unless otherwise directed, to increase your fluids for the next 24-48hrs and to resume your normal diet.  This test typically takes about 30 minutes to perform. __________________________________________________________________________________      Due to recent changes in healthcare laws, you may see the results of your imaging and laboratory studies on MyChart before your provider has had a chance to review them.  We understand that in some cases there may be results that are confusing or concerning to you. Not all laboratory results come back in the same time frame and the provider may be waiting for multiple results in order to interpret others.  Please give [place] 48 hours in order for your provider to thoroughly review all the results before contacting the office for clarification of your results.   Thank you for choosing me and [facility].  Amy Esterwood-PA

## 2019-11-11 NOTE — Progress Notes (Signed)
Subjective:    Patient ID: Megan Miller, female    DOB: 01-Jul-1956, 64 y.o.   MRN: [id number]  HPI [name] is a pleasant 64 year old African-American female, established with Dr. Rhea Belton who was last seen in our office in 2016.  She is referred today by Kristen Cardinal DO.  For complaints of GERD and epigastric discomfort.  Patient has history of hypertension, prior CVA, spinal stenosis and chronic migraine. She last underwent EGD in June 2016 which was a normal exam.  She had colonoscopy in 2013 per Dr. May God with finding of a single diverticulum and small internal and external hemorrhoids, no polyps. Upper abdominal ultrasound in 2016 showed coarsened hepatic echotexture question early cirrhosis otherwise negative exam. Most recent labs February 2021 hemoglobin 11.9 hematocrit of 37.3 platelets 313, lipid panel within normal limits August 2020 and LFTs normal April 2020. Patient says that she is currently taking AcipHex 20 mg p.o. daily and as long as she has the medicine and takes it regularly she feels fine.  When she runs out of medicine then she will have flareup with epigastric/subxiphoid discomfort.  She denies any regular heartburn or indigestion.  She does admit to some solid food dysphagia intermittently, but says this does not happen very often and does not require regurgitation.  Appetite has been fine, weight has been stable, no nausea or vomiting.  Bowel movements have been fairly normal as long as she takes MiraLAX on a regular basis.  She would like a prescription for the MiraLAX. She has ibuprofen listed on her med list but says she does not take this regularly. She has no history of EtOH use, no prior history of hepatitis, no family history of liver disease that she is aware of.   Review of Systems Pertinent positive and negative review of systems were noted in the above HPI section.  All other review of systems was otherwise negative.  Outpatient Encounter Medications as of  11/11/2019  Medication Sig  . acetaminophen (TYLENOL 8 HOUR) 650 MG CR tablet Take 1 tablet (650 mg total) by mouth every 6 (six) hours as needed for pain.  Marland Kitchen aspirin 81 MG tablet Take 1 tablet (81 mg total) by mouth daily.  Marland Kitchen atorvastatin (LIPITOR) 40 MG tablet Take 1 tablet (40 mg total) by mouth daily.  . baclofen (LIORESAL) 10 MG tablet TAKE 0.5 TABLETS (5 MG TOTAL) BY MOUTH 2 (TWO) TIMES DAILY.  . cimetidine (CIMETIDINE 200) 200 MG tablet Take 1 tablet (200 mg total) by mouth daily.  Marland Kitchen desonide (DESOWEN) 0.05 % cream Apply topically 2 (two) times daily. (Patient taking differently: Apply 1 application topically daily. For itching)  . diclofenac sodium (VOLTAREN) 1 % GEL Apply 2 g topically 4 (four) times daily.  Marland Kitchen gabapentin (NEURONTIN) 100 MG capsule Take 1 capsule (100 mg total) by mouth 3 (three) times daily as needed.  Marland Kitchen ibuprofen (ADVIL) 400 MG tablet Take 1 tablet (400 mg total) by mouth every 6 (six) hours as needed.  . nitroGLYCERIN (NITRODUR - DOSED IN MG/24 HR) 0.2 mg/hr patch APPLY 1/4 PATCH TO AFFECTED AREA. CHANGE PATCH EVERY 24 HOURS.  Marland Kitchen olopatadine (PATANOL) 0.1 % ophthalmic solution Place 1 drop into both eyes 2 (two) times daily as needed (seasonal allergies/itching).   . ondansetron (ZOFRAN ODT) 4 MG disintegrating tablet Take 1 tablet (4 mg total) by mouth every 8 (eight) hours as needed for nausea or vomiting.  . ondansetron (ZOFRAN) 8 MG tablet Take 1 tablet (8 mg total) by  mouth every 8 (eight) hours as needed for nausea or vomiting.  . polyethylene glycol (MIRALAX / GLYCOLAX) 17 g packet Take 17 g by mouth 2 (two) times daily.  . RABEprazole (ACIPHEX) 20 MG tablet Take 1 tablet (20 mg total) by mouth daily. Brand Name Medically Neccessary.  . sucralfate (CARAFATE) 1 g tablet Take 1 tablet (1 g total) by mouth 4 (four) times daily -  with meals and at bedtime.  . traMADol (ULTRAM) 50 MG tablet Take 1 tablet (50 mg total) by mouth every 6 (six) hours as needed.  .  [DISCONTINUED] RABEprazole (ACIPHEX) 20 MG tablet Take 1 tablet (20 mg total) by mouth daily. Brand Name Medically Neccessary.  . carvedilol (COREG) 3.125 MG tablet Take 1 tablet (3.125 mg total) by mouth 2 (two) times daily with a meal.  . ipratropium (ATROVENT) 0.06 % nasal spray Place 2 sprays into both nostrils 4 (four) times daily. (Patient taking differently: Place 2 sprays into both nostrils 4 (four) times daily as needed (seasonal allergies). )  . polyethylene glycol powder (GLYCOLAX/MIRALAX) 17 GM/SCOOP powder Dissolve 17grams in at at least 8 ounces of water or juice daily as needed.   No facility-administered encounter medications on file as of 11/11/2019.   Allergies  Allergen Reactions  . Percocet [Oxycodone-Acetaminophen] Nausea And Vomiting  . Codeine Phosphate Itching   Patient Active Problem List   Diagnosis Date Noted  . 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 Burning chest pain 09/18/2019  . Primary osteoarthritis of right knee 07/09/2018  . Pain of left hip joint 07/09/2018  . Lightheadedness 04/23/2018  . Gait instability 01/08/2018  . Chronic migraine without aura without status migrainosus, not intractable 11/13/2017  . Tension headache 11/13/2017  . Lumbosacral radiculopathy 11/12/2017  . Cerebrovascular accident (CVA) ([facility]CC) 11/08/2017  . Spinal stenosis of lumbar region 11/08/2017  . Routine adult health maintenance 02/13/2016  . Syncope 11/15/2015  . Edema of right foot 01/27/2015  . Intertrigo 12/30/2014  . Stable angina (HCC) 09/23/2014  . Hot flashes 11/27/2013  . Hair loss 09/10/2013  . Depression 03/18/2013  . Dysuria 06/24/2012  . 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 OVERWEIGHT 05/05/2009  . CONSTIPATION, CHRONIC 02/10/2009  . DYSTHYMIC DISORDER 02/06/2008  . GERD 02/06/2008  . HYPERTENSION, BENIGN 10/09/2006  . Chronic low back pain 10/09/2006   Social History   Socioeconomic History  . Marital status: Divorced    Spouse name: Not on file  . Number of  children: 0  . Years of education: Not on file  . Highest education level: Not on file  Occupational History  . Occupation: Disability  Tobacco Use  . Smoking status: Never Smoker  . Smokeless tobacco: Never Used  Substance and Sexual Activity  . Alcohol use: No    Alcohol/week: 0.0 standard drinks  . Drug use: No  . Sexual activity: Never    Birth control/protection: Abstinence  Other Topics Concern  . Not on file  Social History Narrative  . Not on file   Social Determinants of Health   Financial Resource Strain:   . Difficulty of Paying Living Expenses:   Food Insecurity:   . Worried About Programme researcher, broadcasting/film/video in the Last Year:   . Barista in the Last Year:   Transportation Needs:   . Freight forwarder (Medical):   Marland Kitchen Lack of Transportation (Non-Medical):   Physical Activity:   . Days of Exercise per Week:   . Minutes of Exercise per Session:   Stress:   .  Feeling of Stress :   Social Connections:   . Frequency of Communication with Friends and Family:   . Frequency of Social Gatherings with Friends and Family:   . Attends Religious Services:   . Active Member of Clubs or Organizations:   . Attends Banker Meetings:   Marland Kitchen Marital Status:   Intimate Partner Violence:   . Fear of Current or Ex-Partner:   . Emotionally Abused:   Marland Kitchen Physically Abused:   . Sexually Abused:     Ms. Masi family history includes Asthma in her brother; Diabetes in her brother, father, and sister; Hypertension in her mother; Kidney disease in her brother.      Objective:    Vitals:   11/11/19 1042  BP: (!) 144/88  Pulse: 80  Temp: 98.2 F (36.8 C)    Physical Exam Well-developed well-nourished older African-American female in no acute distress.  Height, Weight, 184 BMI 34.7  HEENT; nontraumatic normocephalic, EOMI, PER R LA, sclera anicteric. Oropharynx; not examined Neck; supple, no JVD Cardiovascular; regular rate and rhythm with S1-S2, no  murmur rub or gallop Pulmonary; Clear bilaterally Abdomen; soft, nontender, nondistended, no palpable mass or hepatosplenomegaly, bowel sounds are active Rectal; not done today Skin; benign exam, no jaundice rash or appreciable lesions Extremities; no clubbing cyanosis or edema skin warm and dry Neuro/Psych; alert and oriented x4, grossly nonfocal mood and affect appropriate       Assessment & Plan:   #70 64 year old African-American female with GERD she is fairly well controlled on daily AcipHex 20 mg.  Patient does develop subxiphoid/epigastric discomfort if she runs out of medication. She also has some vague intermittent solid food dysphagia.  Will rule out esophageal ring or peptic stricture. Colon cancer surveillance-up-to-date with negative colonoscopy 2013. 3.  Fatty liver versus early cirrhosis on prior ultrasound 2016.  LFTs and lipid panel unremarkable 2020  #4 history of spinal stenosis 5.  Prior CVA 6.  Hypertension  Plan Schedule for barium swallow with tablet.  If this shows any evidence of stricture or ring then she may require EGD with dilation. Continue AcipHex 20 mg p.o. daily on a regular basis, refills x1 year sent. Have sent prescription for MiraLAX 17 g in 18 ounces of water daily. Schedule for upper abdominal ultrasound. Further recommendations pending findings of above.  Ephrem Carrick S Fareeha Evon PA-C 11/11/2019   Cc: [name], DO

## 2019-11-12 ENCOUNTER — Other Ambulatory Visit: Payer: Self-pay

## 2019-11-12 ENCOUNTER — Ambulatory Visit: Payer: Medicaid Other | Admitting: Physical Therapy

## 2019-11-12 DIAGNOSIS — R42 Dizziness and giddiness: Secondary | ICD-10-CM

## 2019-11-12 DIAGNOSIS — R262 Difficulty in walking, not elsewhere classified: Secondary | ICD-10-CM

## 2019-11-12 DIAGNOSIS — R29898 Other symptoms and signs involving the musculoskeletal system: Secondary | ICD-10-CM

## 2019-11-12 DIAGNOSIS — M6281 Muscle weakness (generalized): Secondary | ICD-10-CM

## 2019-11-12 DIAGNOSIS — R2681 Unsteadiness on feet: Secondary | ICD-10-CM

## 2019-11-12 DIAGNOSIS — R293 Abnormal posture: Secondary | ICD-10-CM

## 2019-11-13 NOTE — Therapy (Addendum)
[facility] [address] [place], [place], [address] Phone: [phone]   Fax:  [phone]  Physical Therapy Treatment  Patient Details  Name: Megan Miller MRN: [id number] Date of Birth: 1955-09-27 Referring Provider (PT): Matilde Haymaker, MD - Resident - cosigned by Zenia Resides, MD   Encounter Date: 11/12/2019  PT End of Session - 11/13/19 1225    Visit Number  3    Number of Visits  12    Authorization Type  Medicaid - awaiting approval of first 3 visits    Authorization Time Period  11-05-19 - 11-25-19    Authorization - Visit Number  2    Authorization - Number of Visits  3    PT Start Time  9485    PT Stop Time  1315    PT Time Calculation (min)  39 min    Activity Tolerance  Patient tolerated treatment well    Behavior During Therapy  [facility] for tasks assessed/performed       Past Medical History:  Diagnosis Date  . Allergic conjunctivitis   . Chest pain   . Chronic back pain   . Constipation, chronic   . Dyspnea on exertion   . Dysthymic   . GERD (gastroesophageal reflux disease)   . Hypertension   . Leg edema    Asymetric Right leg (dopplers negative)  . Overweight (BMI 25.0-29.9)   . Stroke ([place])   . Urination frequency     Past Surgical History:  Procedure Laterality Date  . 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 BILATERAL SALPINGOOPHORECTOMY  05/08/2001   Path=extensive endometriosis  . BREAST SURGERY  12/29/2003   Right breast core biopsy=benign fibrocystic change  . SMALL INTESTINE SURGERY  03/30/2001   Rectosigmoid bowel resection and reanastomosis    There were no vitals filed for this visit.  Subjective Assessment - 11/12/19 1240    Subjective  Pt states dizziness is about the same - no significant changes    Pertinent History  allergic conjunctivitis, chest pain, chronic back pain, DOE, HTN, LE edema, CVA - Old RIGHT basal ganglia, bilateral thalamus and  bilateralcerebellar small infarcts; went to ED 2 years ago for sudden onset dizziness/vomiting    Patient Stated Goals  To stop the dizziness    Currently in Pain?  No/denies    Pain Onset  More than a month ago                       [facility] Adult PT Treatment/Exercise - 11/13/19 0001      Ambulation/Gait   Ambulation/Gait  Yes    Ambulation/Gait Assistance  5: Supervision    Ambulation/Gait Assistance Details  tactile cues to raise Rt shoulder & lower Lt shoulder     Ambulation Distance (Feet)  230 Feet    Assistive device  None    Gait Pattern  Step-through pattern    Ambulation Surface  Level;Indoor      High Level Balance   High Level Balance Activities  Backward walking;Turns;Head turns;Tandem walking;Marching forwards   tandem walking inside // bars with UE support prn         Balance Exercises - 11/13/19 1223      Balance Exercises: Standing   Standing Eyes Opened  Narrow base of support (BOS);Wide (BOA);Head turns;Foam/compliant surface;5 reps    Standing Eyes Closed  Narrow base of support (BOS);Wide (BOA);Head turns;Foam/compliant surface;5 reps    SLS  Eyes open;2 reps;Intermittent upper extremity support   10 sec hold   Rockerboard  Anterior/posterior;EO;10 reps;Intermittent UE support    Retro Gait  2 reps   inside // bars    Turning  Right;Left    Other Standing Exercises  marching on incline - EO and EC with head turns with CGA          PT Short Term Goals - 11/13/19 1229      PT SHORT TERM GOAL #1   Title  Pt will participate in further vestibular and balance assessment: DVA and FGA    Baseline  achieved: FGA: 9/30, DVA - 5 line difference    Time  4    Period  Weeks    Status  Achieved      PT SHORT TERM GOAL #2   Title  Pt will demonstrate independence with initial vestibular and balance HEP    Baseline  demonstrates independence with initial HEP    Time  4    Period  Weeks    Status  Achieved      PT SHORT TERM GOAL #3    Title  Pt will report no dizziness with rolling in bed or during supine <> sit    Baseline  mild-moderate dizziness with bed mobility    Time  4    Period  Weeks    Status  On-going      PT SHORT TERM GOAL #4   Title  Pt will ambulate x 230' indoors with L and R turns with cane and no veering to R    Baseline  115' with cane with significant veering to R    Time  4    Period  Weeks    Status  On-going        PT Long Term Goals - 11/13/19 1229      PT LONG TERM GOAL #1   Title  Pt will be independent with final vestibular and balance HEP    Baseline  dependent to date    Time  6    Period  Weeks    Status  On-going      PT LONG TERM GOAL #2   Title  Pt will demonstrate improved use of VOR as indicated by 2-3 line difference on DVA    Baseline  5 line difference    Time  6    Period  Weeks    Status  Revised      PT LONG TERM GOAL #3   Title  Pt will demonstrate 4 point improvement in FGA to indicate decreased falls risk    Baseline  9/30    Time  6    Period  Weeks    Status  New      PT LONG TERM GOAL #4   Title  Pt will ambulate x 230' indoors with L and R turns WITHOUT CANE, independently without veering to R    Baseline  115' with cane with significant veering to R    Time  6    Period  Weeks    Status  On-going               11/12/19 1242  Plan  Clinical Impression Statement Pt did well with gait training without use of SPC in today's session; pt demonstrates postural abnormality with Rt shoulder significantly lower than Lt shoulder in standing position - pt able to partially correct with tactile cues.  No dizziness reported in today's  session with any activities.  Addendum: Pt is making slow but steady progress and has met 2 STG with other 2 goals ongoing - unable to assess final 2 STG due to pt having to cancel her third visit due to a conflict in scheduling.  LTG have been updated.  Pt continues to present with dizziness, balance and gait impairments  that place her at high risk for falls.  Pt will benefit from continued skilled PT services to address these impairments to maximize functional mobility independence and decrease falls risk.  Personal Factors and Comorbidities Comorbidity 3+;Finances;Past/Current Experience  Comorbidities allergic conjunctivitis, chest pain, chronic back pain, DOE, HTN, LE edema, CVA - Old RIGHT basal ganglia, bilateral thalamus and bilateralcerebellar small infarcts; went to ED 2 years ago for sudden onset dizziness/vomiting  Examination-Activity Limitations Bed Mobility;Locomotion Level  Examination-Participation Restrictions Driving  Pt will benefit from skilled therapeutic intervention in order to improve on the following deficits Decreased balance;Decreased strength;Dizziness;Difficulty walking  Stability/Clinical Decision Making Stable/Uncomplicated  Rehab Potential Good  PT Frequency 1x / week  PT Duration Other (comment) (1x/week x 3 + 1x/week x 6)  PT Treatment/Interventions ADLs/Self Care Home Management;Canalith Repostioning;DME Instruction;Gait training;Stair training;Functional mobility training;Therapeutic activities;Therapeutic exercise;Balance training;Neuromuscular re-education;Patient/family education;Vestibular  PT Next Visit Plan Have requested 6 more visits from [facility];  continue with visual/vestibular exs. - gait train without device as pt tolerates  PT Home Exercise Plan x1 viewing, balance on foam, SLS  Consulted and Agree with Plan of Care Patient    Patient will benefit from skilled therapeutic intervention in order to improve the following deficits and impairments:  Decreased balance, Decreased strength, Dizziness, Difficulty walking  Visit Diagnosis: Dizziness and giddiness  Unsteadiness on feet  Abnormal posture     Problem List Patient Active Problem List   Diagnosis Date Noted  . 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 Burning chest pain 09/18/2019  . Primary osteoarthritis of  right knee 07/09/2018  . Pain of left hip joint 07/09/2018  . Lightheadedness 04/23/2018  . Gait instability 01/08/2018  . Chronic migraine without aura without status migrainosus, not intractable 11/13/2017  . Tension headache 11/13/2017  . Lumbosacral radiculopathy 11/12/2017  . Cerebrovascular accident (CVA) ([place]) 11/08/2017  . Spinal stenosis of lumbar region 11/08/2017  . Routine adult health maintenance 02/13/2016  . Syncope 11/15/2015  . Edema of right foot 01/27/2015  . Intertrigo 12/30/2014  . Stable angina ([place]) 09/23/2014  . Hot flashes 11/27/2013  . Hair loss 09/10/2013  . Depression 03/18/2013  . Dysuria 06/24/2012  . 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 OVERWEIGHT 05/05/2009  . CONSTIPATION, CHRONIC 02/10/2009  . DYSTHYMIC DISORDER 02/06/2008  . GERD 02/06/2008  . HYPERTENSION, BENIGN 10/09/2006  . Chronic low back pain 10/09/2006    Alda Lea, PT 11/13/2019, 12:33 PM   Addendum:  Rico Junker, PT, DPT 11/18/19    1:38 PM    [facility] [address] [place] [place], [place], [address] Phone: [phone]   Fax:  [phone]  Name: SANAZ SCARLETT MRN: [id number] Date of Birth: 12-17-1955

## 2019-11-16 NOTE — Progress Notes (Signed)
Addendum: Reviewed and agree with assessment and management plan. Raylin Diguglielmo M, MD  

## 2019-11-18 ENCOUNTER — Ambulatory Visit: Payer: Medicaid Other | Admitting: Physical Therapy

## 2019-11-20 ENCOUNTER — Ambulatory Visit: Payer: Medicaid Other | Admitting: Physical Therapy

## 2019-11-20 ENCOUNTER — Other Ambulatory Visit: Payer: Self-pay

## 2019-11-20 DIAGNOSIS — R42 Dizziness and giddiness: Secondary | ICD-10-CM | POA: Diagnosis not present

## 2019-11-20 DIAGNOSIS — R293 Abnormal posture: Secondary | ICD-10-CM

## 2019-11-20 DIAGNOSIS — M6281 Muscle weakness (generalized): Secondary | ICD-10-CM

## 2019-11-20 DIAGNOSIS — R29898 Other symptoms and signs involving the musculoskeletal system: Secondary | ICD-10-CM

## 2019-11-20 DIAGNOSIS — R262 Difficulty in walking, not elsewhere classified: Secondary | ICD-10-CM

## 2019-11-20 DIAGNOSIS — R2681 Unsteadiness on feet: Secondary | ICD-10-CM

## 2019-11-20 NOTE — Patient Instructions (Signed)
Gaze Stabilization: Sitting    Keeping eyes on target on wall 3 feet away, and move head side to side for _30_ seconds. Repeat while moving head up and down for __30__ seconds. Do __2-3__ sessions per day.  Copyright  VHI. All rights reserved.   Gaze Stabilization: Tip Card  1.Target must remain in focus, not blurry, and appear stationary while head is in motion. 2.Perform exercises with small head movements (45 to either side of midline). 3.Increase speed of head motion so long as target is in focus. 4.If you wear eyeglasses, be sure you can see target through lens (therapist will give specific instructions for bifocal / progressive lenses). 5.These exercises may provoke dizziness or nausea. Work through these symptoms. If too dizzy, slow head movement slightly. Rest between each exercise. 6.Exercises demand concentration; avoid distractions.  Copyright  VHI. All rights reserved.       Feet Apart (Compliant Surface) Head Motion - Eyes Closed    Stand on compliant surface: _pillows_______ with feet shoulder width apart. Close eyes and move head slowly, up and down. Repeat __1__ times per session. Do _1___ sessions per day.  Feet Together (Compliant Surface) Head Motion - Eyes Closed    Stand on compliant surface: __pillows______ with feet together. Close eyes and move head slowly, up and down. Repeat _1___ times per session. Do __1__ sessions per day.

## 2019-11-21 ENCOUNTER — Encounter: Payer: Self-pay | Admitting: Physical Therapy

## 2019-11-21 NOTE — Therapy (Signed)
[facility] [address] [place], [place], [address] Phone: [phone]   Fax:  [phone]  Physical Therapy Treatment  Patient Details  Name: Megan Miller MRN: [id number] Date of Birth: [birth year] Referring Provider (PT): Matilde Haymaker, MD - Resident - cosigned by Zenia Resides, MD   Encounter Date: 11/20/2019  PT End of Session - 11/21/19 1604    Visit Number  4    Number of Visits  12    Authorization Type  Medicaid - first three visits approved; awaiting approval of 6 more visits    Authorization Time Period  11-05-19 - 11-25-19    Authorization - Visit Number  3    Authorization - Number of Visits  3    PT Start Time  1027    PT Stop Time  1105    PT Time Calculation (min)  38 min    Activity Tolerance  Patient tolerated treatment well    Behavior During Therapy  [facility] for tasks assessed/performed       Past Medical History:  Diagnosis Date  . Allergic conjunctivitis   . Chest pain   . Chronic back pain   . Constipation, chronic   . Dyspnea on exertion   . Dysthymic   . GERD (gastroesophageal reflux disease)   . Hypertension   . Leg edema    Asymetric Right leg (dopplers negative)  . Overweight (BMI 25.0-29.9)   . Stroke ([place])   . Urination frequency     Past Surgical History:  Procedure Laterality Date  . 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 BILATERAL SALPINGOOPHORECTOMY  05/08/2001   Path=extensive endometriosis  . BREAST SURGERY  12/29/2003   Right breast core biopsy=benign fibrocystic change  . SMALL INTESTINE SURGERY  03/30/2001   Rectosigmoid bowel resection and reanastomosis    There were no vitals filed for this visit.  Subjective Assessment - 11/20/19 1033    Subjective  Dizziness is about the same.  Pt reports yesterday she was getting out of bed and her left leg gave out and she fell on the floor - no injuries.  No questions or issues with exercises.     Pertinent History  allergic conjunctivitis, chest pain, chronic back pain, DOE, HTN, LE edema, CVA - Old RIGHT basal ganglia, bilateral thalamus and bilateralcerebellar small infarcts; went to ED 2 years ago for sudden onset dizziness/vomiting    Patient Stated Goals  To stop the dizziness    Currently in Pain?  No/denies    Pain Onset  More than a month ago             Vestibular Assessment - 11/20/19 1036      Positional Sensitivities   Sit to Supine  No dizziness    Supine to Left Side  No dizziness    Supine to Right Side  No dizziness    Supine to Sitting  No dizziness    Nose to Right Knee  No dizziness    Right Knee to Sitting  No dizziness    Nose to Left Knee  No dizziness    Left Knee to Sitting  No dizziness    Head Turning x 5  No dizziness    Head Nodding x 5  No dizziness    Pivot Right in Standing  No dizziness    Pivot Left in Standing  No dizziness    Rolling Right  No dizziness    Rolling Left  No dizziness    Positional Sensitivities Comments  Walking with head turns and head nods induced dizziness, settled quickly                Vestibular Treatment/Exercise - 11/20/19 1058      Vestibular Treatment/Exercise   Gaze Exercises  X1 Viewing Horizontal;X1 Viewing Vertical      X1 Viewing Horizontal   Foot Position  started in standing with feet apart and then changed to sitting due to pt inability to perform/sequence in standing without LOB    Reps  2    Comments  30 seconds with verbal cues for technique      X1 Viewing Vertical   Foot Position  seated    Reps  1    Comments  30 seconds, no difficulty, no symptoms         Balance Exercises - 11/20/19 1100      Balance Exercises: Standing   Gait with Head Turns  Forward;Upper extremity support;4 reps   dizziness over ground; difficulty coordinating   Other Standing Exercises  did not add walking with head turns to HEP right now due to inability to coordinate or sequence; will review at  next session and will add to HEP at a later date        PT Education - 11/21/19 1601    Education Details  downgraded x1 viewing to sitting; pt to continue balance exercises    Person(s) Educated  Patient    Methods  Explanation;Demonstration;Handout    Comprehension  Verbalized understanding;Returned demonstration       PT Short Term Goals - 11/20/19 1047      PT SHORT TERM GOAL #1   Title  Pt will participate in further vestibular and balance assessment: DVA and FGA    Baseline  achieved: FGA: 9/30, DVA - 5 line difference    Time  4    Period  Weeks    Status  Achieved      PT SHORT TERM GOAL #2   Title  Pt will demonstrate independence with initial vestibular and balance HEP    Baseline  demonstrates independence with initial HEP    Time  4    Period  Weeks    Status  Achieved      PT SHORT TERM GOAL #3   Title  Pt will report no dizziness with rolling in bed or during supine <> sit    Baseline  none on 4/23    Time  4    Period  Weeks    Status  Achieved      PT SHORT TERM GOAL #4   Title  Pt will ambulate x 230' indoors with L and R turns with cane and no veering to R    Baseline  230' with cane with dizziness with head turns and head nods    Time  4    Period  Weeks    Status  Not Met        PT Long Term Goals - 11/13/19 1229      PT LONG TERM GOAL #1   Title  Pt will be independent with final vestibular and balance HEP    Baseline  dependent to date    Time  6    Period  Weeks    Status  On-going      PT LONG TERM GOAL #2   Title  Pt will demonstrate improved use of VOR as indicated by 2-3 line  difference on DVA    Baseline  5 line difference    Time  6    Period  Weeks    Status  Revised      PT LONG TERM GOAL #3   Title  Pt will demonstrate 4 point improvement in FGA to indicate decreased falls risk    Baseline  9/30    Time  6    Period  Weeks    Status  New      PT LONG TERM GOAL #4   Title  Pt will ambulate x 230' indoors with L and R  turns WITHOUT CANE, independently without veering to R    Baseline  115' with cane with significant veering to R    Time  6    Period  Weeks    Status  On-going            Plan - 11/21/19 1606    Clinical Impression Statement  Continued assessment of progress towards STG.  Pt did meet 3/4 STG but did not meet final goal.  Pt demonstrating improvement in motion sensitivity with bed mobility and with head movements in static position but continues to experience dizziness and imbalance when performing head movements when ambulating.  Pt also noted to be performing x1 viewing incorrectly which may be affecting patient's progress.  Adjusted HEP.  PT to continue to address and progress towards LTG.  6 more visits requested from [place].    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 ganglia, bilateral thalamus and bilateral cerebellar small infarcts; went to ED 2 years ago for sudden onset dizziness/vomiting    Examination-Activity Limitations  Bed Mobility;Locomotion Level    Examination-Participation Restrictions  Driving    Stability/Clinical Decision Making  Stable/Uncomplicated    Rehab Potential  Good    PT Frequency  1x / week    PT Duration  Other (comment)   1x/week x 3 + 1x/week x 6   PT Treatment/Interventions  ADLs/Self Care Home Management;Canalith Repostioning;DME Instruction;Gait training;Stair training;Functional mobility training;Therapeutic activities;Therapeutic exercise;Balance training;Neuromuscular re-education;Patient/family education;Vestibular    PT Next Visit Plan  review and continue to progress x1 viewing; gait training without AD.  walking with head turns with UE support on counter - add to HEP    PT Home Exercise Plan  x1 viewing, balance on foam, SLS    Consulted and Agree with Plan of Care  Patient        Patient will benefit from skilled therapeutic intervention in order to improve the following deficits and impairments:  Decreased balance, Decreased strength, Dizziness, Difficulty walking  Visit Diagnosis: Dizziness and giddiness  Unsteadiness on feet  Abnormal posture  Difficulty in walking, not elsewhere classified  Muscle weakness (generalized)  Other symptoms and signs involving the musculoskeletal system     Problem List Patient Active Problem List   Diagnosis Date Noted  . 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 Burning chest pain 09/18/2019  . Primary osteoarthritis of right knee 07/09/2018  . Pain of left hip joint 07/09/2018  . Lightheadedness 04/23/2018  . Gait instability 01/08/2018  . Chronic migraine without aura without status migrainosus, not intractable 11/13/2017  . Tension headache 11/13/2017  . Lumbosacral radiculopathy 11/12/2017  . Cerebrovascular accident (CVA) ([place]) 11/08/2017  . Spinal stenosis of lumbar region 11/08/2017  . Routine adult health maintenance 02/13/2016  . Syncope 11/15/2015  .  Edema of right foot 01/27/2015  . Intertrigo 12/30/2014  . Stable angina ([place]) 09/23/2014  . Hot flashes 11/27/2013  . Hair loss 09/10/2013  . Depression 03/18/2013  . Dysuria 06/24/2012  . 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 OVERWEIGHT 05/05/2009  . CONSTIPATION, CHRONIC 02/10/2009  . DYSTHYMIC DISORDER 02/06/2008  . GERD 02/06/2008  . HYPERTENSION, BENIGN 10/09/2006  . Chronic low back pain 10/09/2006    Rico Junker, PT, DPT 11/21/19    4:23 PM    [facility] [address] [place], [place], [address] Phone: [phone]   Fax:  [phone]  Name: AZRIEL DANCY MRN: [id number] Date of Birth: 08/06/1955

## 2019-11-25 ENCOUNTER — Ambulatory Visit (HOSPITAL_COMMUNITY)
Admission: RE | Admit: 2019-11-25 | Discharge: 2019-11-25 | Disposition: A | Discharge status: Home / Self Care | Payer: Medicaid Other | Source: Ambulatory Visit | Attending: Physician Assistant | Admitting: Physician Assistant

## 2019-11-25 ENCOUNTER — Other Ambulatory Visit: Payer: Self-pay

## 2019-11-25 DIAGNOSIS — K5909 Other constipation: Secondary | ICD-10-CM

## 2019-11-25 DIAGNOSIS — K219 Gastro-esophageal reflux disease without esophagitis: Secondary | ICD-10-CM

## 2019-11-25 DIAGNOSIS — R1319 Other dysphagia: Secondary | ICD-10-CM

## 2019-11-30 ENCOUNTER — Telehealth: Payer: Self-pay | Admitting: Family Medicine

## 2019-11-30 NOTE — Telephone Encounter (Signed)
Pt is calling and would like the doctor to either write an order or prescription so she can get a walker she can use to get around. jw

## 2019-11-30 NOTE — Telephone Encounter (Signed)
Routed to PCP. Laurella Tull, CMA  

## 2019-12-01 NOTE — Telephone Encounter (Signed)
Attempted to reach pt to inform of note. No answer. LVM for pt to call the office. Aquilla Solian, CMA

## 2019-12-01 NOTE — Telephone Encounter (Signed)
Patient informed via front office.  Graden Hoshino,CMA

## 2019-12-03 ENCOUNTER — Ambulatory Visit: Payer: Medicaid Other | Admitting: Physical Therapy

## 2019-12-10 ENCOUNTER — Ambulatory Visit: Payer: Medicaid Other | Attending: Family Medicine | Admitting: Physical Therapy

## 2019-12-10 ENCOUNTER — Other Ambulatory Visit: Payer: Self-pay

## 2019-12-10 ENCOUNTER — Encounter: Payer: Self-pay | Admitting: Physical Therapy

## 2019-12-10 DIAGNOSIS — R262 Difficulty in walking, not elsewhere classified: Secondary | ICD-10-CM | POA: Diagnosis present

## 2019-12-10 DIAGNOSIS — R293 Abnormal posture: Secondary | ICD-10-CM

## 2019-12-10 DIAGNOSIS — R2681 Unsteadiness on feet: Secondary | ICD-10-CM | POA: Diagnosis present

## 2019-12-10 DIAGNOSIS — M6281 Muscle weakness (generalized): Secondary | ICD-10-CM

## 2019-12-10 DIAGNOSIS — R42 Dizziness and giddiness: Secondary | ICD-10-CM

## 2019-12-10 DIAGNOSIS — R29898 Other symptoms and signs involving the musculoskeletal system: Secondary | ICD-10-CM | POA: Insufficient documentation

## 2019-12-10 NOTE — Therapy (Signed)
[facility] [address] [place], [place], [address] Phone: [phone]   Fax:  [phone]  Physical Therapy Treatment  Patient Details  Name: Megan Miller MRN: [id number] Date of Birth: [birth year] Referring Provider (PT): Matilde Haymaker, MD - Resident - cosigned by Zenia Resides, MD   Encounter Date: 12/10/2019  PT End of Session - 12/10/19 1245    Visit Number  5    Number of Visits  12    Date for PT Re-Evaluation  01/13/20    Authorization Type  Medicaid    Authorization Time Period  6 visits approved 12/03/19 > 01/13/20    Authorization - Visit Number  1    Authorization - Number of Visits  6    PT Start Time  1155   arrived late   PT Stop Time  1230    PT Time Calculation (min)  35 min    Activity Tolerance  Patient tolerated treatment well    Behavior During Therapy  [facility] for tasks assessed/performed       Past Medical History:  Diagnosis Date  . Allergic conjunctivitis   . Chest pain   . Chronic back pain   . Constipation, chronic   . Dyspnea on exertion   . Dysthymic   . GERD (gastroesophageal reflux disease)   . Hypertension   . Leg edema    Asymetric Right leg (dopplers negative)  . Overweight (BMI 25.0-29.9)   . Stroke ([place])   . Urination frequency     Past Surgical History:  Procedure Laterality Date  . 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 BILATERAL SALPINGOOPHORECTOMY  05/08/2001   Path=extensive endometriosis  . BREAST SURGERY  12/29/2003   Right breast core biopsy=benign fibrocystic change  . SMALL INTESTINE SURGERY  03/30/2001   Rectosigmoid bowel resection and reanastomosis    There were no vitals filed for this visit.  Subjective Assessment - 12/10/19 1159    Subjective  Reports dizziness is a little bit better.  Had another episode of her LE giving out and falling.  Reports it is due to a muscle cramp.  Reports when she goes to the bathroom and urinates  the spasm goes away.    Pertinent History  allergic conjunctivitis, chest pain, chronic back pain, DOE, HTN, LE edema, CVA - Old RIGHT basal ganglia, bilateral thalamus and bilateralcerebellar small infarcts; went to ED 2 years ago for sudden onset dizziness/vomiting    Patient Stated Goals  To stop the dizziness    Currently in Pain?  No/denies    Pain Onset  More than a month ago                        [facility] Adult PT Treatment/Exercise - 12/10/19 1237      Transfers   Transfers  Sit to Stand;Stand to Sit;Stand Pivot Transfers    Sit to Stand  6: Modified independent (Device/Increase time)    Stand to Sit  6: Modified independent (Device/Increase time)    Stand Pivot Transfers  6: Modified independent (Device/Increase time)    Comments  performed transfer with rollator educating pt on how to sequence and use brakes for sit <> stand; also reviewed safety when using rollator as a seat - advised pt to push rollator up against a stable object before sitting on rollator seat due to tendency for rollator to slide even with brakes on.  Ambulation/Gait   Ambulation/Gait  Yes    Ambulation/Gait Assistance  6: Modified independent (Device/Increase time)    Ambulation/Gait Assistance Details  ambulated with rollator; pt demonstrated significant improvement in gait velocity.  One episode of running into an object on L when turning to the L but second time pt able to negotiate around obstacles without difficulty    Ambulation Distance (Feet)  230 Feet    Assistive device  Rollator    Gait Pattern  Step-through pattern    Ambulation Surface  Level;Indoor      Therapeutic Activites    Therapeutic Activities  Other Therapeutic Activities    Other Therapeutic Activities  provided pt with the following recommendations to decrease bladder irritation and spasms in her LE in the morning and for fall prevention:      Exercises   Exercises  Knee/Hip      Knee/Hip Exercises: Stretches    Active Hamstring Stretch  Both    Active Hamstring Stretch Limitations  2 sets x 10 reps ankle pumps with hamstring stretch in supine.         TO DECREASE IRRITATION ON THE BLADDER  TRY to stop drinking fluids by 8-9 pm and go to the bathroom before going to bed at night.  When you wake up in the morning do this stretch to help with spasms:  Hamstring Stretch: Active    Support behind right knee. Starting with knee bent, attempt to straighten knee until a comfortable stretch is felt in back of thigh. Perform 10 times on the Left leg.  Perform 10 times on the Right leg.  Stand at the edge of your bed holding on to the walker for support.  Let your legs relax before trying to walk to the bathroom.    Gaze Stabilization - Tip Card  1.Target must remain in focus, not blurry, and appear stationary while head is in motion. 2.Perform exercises with small head movements (45 to either side of midline). 3.Increase speed of head motion so long as target is in focus. 4.If you wear eyeglasses, be sure you can see target through lens (therapist will give specific instructions for bifocal / progressive lenses). 5.These exercises may provoke dizziness or nausea. Work through these symptoms. If too dizzy, slow head movement slightly. Rest between each exercise. 6.Exercises demand concentration; avoid distractions. 7.For safety, perform standing exercises close to a counter, wall, corner, or next to someone.  Copyright  VHI. All rights reserved.   Gaze Stabilization - Standing Feet Apart   Feet shoulder width apart, keeping eyes on target on wall 3 feet away, tilt head down slightly and move head side to side for 60 seconds. Repeat while moving head up and down for 60 seconds.  Do 2-3 sessions per day.     Feet Apart (Compliant Surface) Head Motion - Eyes Closed    Stand on compliant surface: _pillows_______ with feet shoulder width apart. Close eyes and move head slowly, up and  down. Repeat __1__ times per session. Do _1___ sessions per day.  Feet Together (Compliant Surface) Head Motion - Eyes Closed    Stand on compliant surface: __pillows______ with feet together. Close eyes and move head slowly, up and down. Repeat _1___ times per session. Do __1__ sessions per day.       Vestibular Treatment/Exercise - 12/10/19 1242      Vestibular Treatment/Exercise   Gaze Exercises  X1 Viewing Horizontal;X1 Viewing Vertical      X1 Viewing Horizontal   Foot Position  seated progressed back to standing with feet apart    Reps  2    Comments  30 seconds progressed to 60 seconds; no dizziness but reports feeling off balance      X1 Viewing Vertical   Foot Position  seated progressed back to standing with feet apart    Reps  2    Comments  30 seconds progressed to 60 seconds; no dizziness but reports feeling off balance            PT Education - 12/10/19 1244    Education Details  see TA; progressed HEP    Person(s) Educated  Patient    Methods  Explanation;Demonstration;Handout    Comprehension  Verbalized understanding;Returned demonstration       PT Short Term Goals - 11/20/19 1047      PT SHORT TERM GOAL #1   Title  Pt will participate in further vestibular and balance assessment: DVA and FGA    Baseline  achieved: FGA: 9/30, DVA - 5 line difference    Time  4    Period  Weeks    Status  Achieved      PT SHORT TERM GOAL #2   Title  Pt will demonstrate independence with initial vestibular and balance HEP    Baseline  demonstrates independence with initial HEP    Time  4    Period  Weeks    Status  Achieved      PT SHORT TERM GOAL #3   Title  Pt will report no dizziness with rolling in bed or during supine <> sit    Baseline  none on 4/23    Time  4    Period  Weeks    Status  Achieved      PT SHORT TERM GOAL #4   Title  Pt will ambulate x 230' indoors with L and R turns with cane and no veering to R    Baseline  230' with cane with  dizziness with head turns and head nods    Time  4    Period  Weeks    Status  Not Met        PT Long Term Goals - 12/10/19 1254      PT LONG TERM GOAL #1   Title  Pt will be independent with final vestibular and balance HEP  (01/13/20 LTG due)    Baseline  dependent to date    Time  6    Period  Weeks    Status  On-going      PT LONG TERM GOAL #2   Title  Pt will demonstrate improved use of VOR as indicated by 2-3 line difference on DVA    Baseline  5 line difference    Time  6    Period  Weeks    Status  Revised      PT LONG TERM GOAL #3   Title  Pt will demonstrate 4 point improvement in FGA to indicate decreased falls risk    Baseline  9/30    Time  6    Period  Weeks    Status  New      PT LONG TERM GOAL #4   Title  Pt will ambulate x 230' indoors with L and R turns WITHOUT CANE, independently without veering to R    Baseline  115' with cane with significant veering to R    Time  6    Period  Weeks  Status  On-going            Plan - 12/10/19 1247    Clinical Impression Statement  Pt continues to experience falls in the morning when first standing due to spasms that resolve after she urinates.  Discussed ways to reduce bladder irritation and reduce spasms in the AM but if this continues to occur will recommend pt follow up with MD.  Performed gait assessment and training with rollator; pt demonstrated faster and more efficient, safer gait with rollator; will alert MD for order.  Pt able to progress x1 viewing back up to standing with no symptoms of dizziness but just mild sway.  Will continue to progress towards LTG.    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 ganglia, bilateral thalamus and bilateral cerebellar small infarcts; went to ED 2 years ago for sudden onset dizziness/vomiting     Examination-Activity Limitations  Bed Mobility;Locomotion Level    Examination-Participation Restrictions  Driving    Stability/Clinical Decision Making  Stable/Uncomplicated    Rehab Potential  Good    PT Frequency  1x / week    PT Duration  Other (comment)   1x/week x 3 + 1x/week x 6   PT Treatment/Interventions  ADLs/Self Care Home Management;Canalith Repostioning;DME Instruction;Gait training;Stair training;Functional mobility training;Therapeutic activities;Therapeutic exercise;Balance training;Neuromuscular re-education;Patient/family education;Vestibular    PT Next Visit Plan  any further leg spasms or falls in the morning?  did she get rollator?  gait training with rollator on ramp/curb/outside.  Progress x1 viewing, corner balance, gait with head turns.    PT Home Exercise Plan  x1 viewing, balance on foam, SLS    Consulted and Agree with Plan of Care  Patient       Patient will benefit from skilled therapeutic intervention in order to improve the following deficits and impairments:  Decreased balance, Decreased strength, Dizziness, Difficulty walking  Visit Diagnosis: Dizziness and giddiness  Unsteadiness on feet  Abnormal posture  Difficulty in walking, not elsewhere classified  Muscle weakness (generalized)     Problem List Patient Active Problem List   Diagnosis Date Noted  . 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 Burning chest pain 09/18/2019  . Primary osteoarthritis of right knee 07/09/2018  . Pain of left hip joint 07/09/2018  . Lightheadedness 04/23/2018  . Gait instability 01/08/2018  . Chronic migraine without aura without status migrainosus, not intractable 11/13/2017  . Tension headache 11/13/2017  . Lumbosacral radiculopathy 11/12/2017  . Cerebrovascular accident (CVA) ([place]) 11/08/2017  . Spinal stenosis of lumbar region 11/08/2017  . Routine adult health maintenance 02/13/2016  . Syncope 11/15/2015  . Edema of right foot 01/27/2015  . Intertrigo  12/30/2014  . Stable angina ([place]) 09/23/2014  . Hot flashes 11/27/2013  . Hair loss 09/10/2013  . Depression 03/18/2013  . Dysuria 06/24/2012  . 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 OVERWEIGHT 05/05/2009  . CONSTIPATION, CHRONIC 02/10/2009  . DYSTHYMIC DISORDER 02/06/2008  . GERD 02/06/2008  . HYPERTENSION, BENIGN 10/09/2006  . Chronic low back pain 10/09/2006    Rico Junker, PT, DPT 12/10/19    12:59 PM    [facility] [address] [place], [place], [address] Phone: [phone]   Fax:  [phone]  Name: Megan Miller MRN: [id number] Date of Birth: 1956-04-02

## 2019-12-10 NOTE — Patient Instructions (Addendum)
TO DECREASE IRRITATION ON THE BLADDER  TRY to stop drinking fluids by 8-9 pm and go to the bathroom before going to bed at night.  When you wake up in the morning do this stretch to help with spasms:  Hamstring Stretch: Active    Support behind right knee. Starting with knee bent, attempt to straighten knee until a comfortable stretch is felt in back of thigh. Perform 10 times on the Left leg.  Perform 10 times on the Right leg.  Stand at the edge of your bed holding on to the walker for support.  Let your legs relax before trying to walk to the bathroom.    Gaze Stabilization - Tip Card  1.Target must remain in focus, not blurry, and appear stationary while head is in motion. 2.Perform exercises with small head movements (45 to either side of midline). 3.Increase speed of head motion so long as target is in focus. 4.If you wear eyeglasses, be sure you can see target through lens (therapist will give specific instructions for bifocal / progressive lenses). 5.These exercises may provoke dizziness or nausea. Work through these symptoms. If too dizzy, slow head movement slightly. Rest between each exercise. 6.Exercises demand concentration; avoid distractions. 7.For safety, perform standing exercises close to a counter, wall, corner, or next to someone.  Copyright  VHI. All rights reserved.   Gaze Stabilization - Standing Feet Apart   Feet shoulder width apart, keeping eyes on target on wall 3 feet away, tilt head down slightly and move head side to side for 60 seconds. Repeat while moving head up and down for 60 seconds.  Do 2-3 sessions per day.     Feet Apart (Compliant Surface) Head Motion - Eyes Closed    Stand on compliant surface: _pillows_______ with feet shoulder width apart. Close eyes and move head slowly, up and down. Repeat __1__ times per session. Do _1___ sessions per day.  Feet Together (Compliant Surface) Head Motion - Eyes Closed    Stand on compliant  surface: __pillows______ with feet together. Close eyes and move head slowly, up and down. Repeat _1___ times per session. Do __1__ sessions per day.

## 2019-12-17 ENCOUNTER — Telehealth: Payer: Self-pay | Admitting: Physical Therapy

## 2019-12-17 ENCOUNTER — Ambulatory Visit: Payer: Medicaid Other | Admitting: Physical Therapy

## 2019-12-17 ENCOUNTER — Encounter: Payer: Self-pay | Admitting: Physical Therapy

## 2019-12-17 ENCOUNTER — Other Ambulatory Visit: Payer: Self-pay

## 2019-12-17 DIAGNOSIS — R293 Abnormal posture: Secondary | ICD-10-CM

## 2019-12-17 DIAGNOSIS — M545 Low back pain, unspecified: Secondary | ICD-10-CM

## 2019-12-17 DIAGNOSIS — M6281 Muscle weakness (generalized): Secondary | ICD-10-CM

## 2019-12-17 DIAGNOSIS — R2681 Unsteadiness on feet: Secondary | ICD-10-CM

## 2019-12-17 DIAGNOSIS — R42 Dizziness and giddiness: Secondary | ICD-10-CM | POA: Diagnosis not present

## 2019-12-17 DIAGNOSIS — R29898 Other symptoms and signs involving the musculoskeletal system: Secondary | ICD-10-CM

## 2019-12-17 DIAGNOSIS — R262 Difficulty in walking, not elsewhere classified: Secondary | ICD-10-CM

## 2019-12-17 NOTE — Patient Instructions (Addendum)
TO DECREASE IRRITATION ON THE BLADDER  TRY to stop drinking fluids by 8-9 pm and go to the bathroom before going to bed at night.  When you wake up in the morning do this stretch to help with spasms:  Hamstring Stretch: Active    Support behind right knee. Starting with knee bent, attempt to straighten knee until a comfortable stretch is felt in back of thigh. Perform 10 times on the Left leg.  Perform 10 times on the Right leg.  Stand at the edge of your bed holding on to the walker for support.  Let your legs relax before trying to walk to the bathroom.   Gaze Stabilization: Standing Feet Together    Feet together, keeping eyes on target on wall __3__ feet away, tilt head down 15-30 and move head side to side for __60__ seconds. Repeat while moving head up and down for ___60_ seconds. Do __2__ sessions per day.   Feet Partial Heel-Toe (Compliant Surface) Head Motion - Eyes Open    With eyes open, standing on compliant surface: pillow, right foot partially in front of the other, move head slowly: up and down 5 times.  Side to side 5 times.  Move right foot back, left foot forwards and repeat 5 head nods slowly, 5 head turns slowly.   Repeat 2 times per session. Do 2 sessions per day.

## 2019-12-17 NOTE — Therapy (Signed)
[facility] [address] [place], [place], [address] Phone: [phone]   Fax:  [phone]  Physical Therapy Treatment  Patient Details  Name: Megan Miller MRN: [id number] Date of Birth: 1956/04/04 Referring Provider (PT): Matilde Haymaker, MD - Resident - cosigned by Zenia Resides, MD   Encounter Date: 12/17/2019  PT End of Session - 12/17/19 1246    Visit Number  6    Number of Visits  12    Date for PT Re-Evaluation  01/13/20    Authorization Type  Medicaid    Authorization Time Period  6 visits approved 12/03/19 > 01/13/20    Authorization - Visit Number  2    Authorization - Number of Visits  6    PT Start Time  1157    PT Stop Time  2094    PT Time Calculation (min)  38 min    Activity Tolerance  Patient tolerated treatment well    Behavior During Therapy  [facility] for tasks assessed/performed       Past Medical History:  Diagnosis Date  . Allergic conjunctivitis   . Chest pain   . Chronic back pain   . Constipation, chronic   . Dyspnea on exertion   . Dysthymic   . GERD (gastroesophageal reflux disease)   . Hypertension   . Leg edema    Asymetric Right leg (dopplers negative)  . Overweight (BMI 25.0-29.9)   . Stroke ([place])   . Urination frequency     Past Surgical History:  Procedure Laterality Date  . 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 BILATERAL SALPINGOOPHORECTOMY  05/08/2001   Path=extensive endometriosis  . BREAST SURGERY  12/29/2003   Right breast core biopsy=benign fibrocystic change  . SMALL INTESTINE SURGERY  03/30/2001   Rectosigmoid bowel resection and reanastomosis    There were no vitals filed for this visit.  Subjective Assessment - 12/17/19 1202    Subjective  Recommendations for fluid intake are helping, no further falls or spasms.  Dizziness is getting better.  Has not heard back from physician office about order for rollator.    Pertinent History   allergic conjunctivitis, chest pain, chronic back pain, DOE, HTN, LE edema, CVA - Old RIGHT basal ganglia, bilateral thalamus and bilateralcerebellar small infarcts; went to ED 2 years ago for sudden onset dizziness/vomiting    Patient Stated Goals  To stop the dizziness    Currently in Pain?  No/denies    Pain Onset  More than a month ago                        [facility] Adult PT Treatment/Exercise - 12/17/19 1216      Ambulation/Gait   Ambulation/Gait  Yes    Ambulation/Gait Assistance  6: Modified independent (Device/Increase time)    Ambulation/Gait Assistance Details  with rollator over outdoor surfaces to practice for community mobility with rollator; no safety issues noted    Ambulation Distance (Feet)  500 Feet    Assistive device  Rollator    Gait Pattern  Step-through pattern    Ambulation Surface  Unlevel;Outdoor;Paved;Grass    Ramp  6: Modified independent (Device)   with rollator   Curb  5: Supervision    Curb Details (indicate cue type and reason)  with rollator and verbal cues for safe sequence    Gait Comments  Also reviewed how to pick Rollator up and over obstacles to safely negotiate  and step over larger obstacles.  Pt asking how to get rollator up/down stairs; demonstrated to pt how to fold rollator and carry up/down stairs with one rail but pt did not wish to return demonstrate.      Vestibular Treatment/Exercise - 12/17/19 1218      Vestibular Treatment/Exercise   Vestibular Treatment Provided  Gaze    Gaze Exercises  X1 Viewing Horizontal;X1 Viewing Vertical      X1 Viewing Horizontal   Foot Position  standing with feet together    Reps  2    Comments  60 seconds each, with and then without UE support.  Mild sway but no dizziness      X1 Viewing Vertical   Foot Position  standing with feet together    Reps  2    Comments  60 seconds each, with and then without UE support.  Mild sway but no dizziness         Balance Exercises - 12/17/19  1245      Balance Exercises: Standing   Standing Eyes Opened  Narrow base of support (BOS);Head turns;Foam/compliant surface;Other reps (comment)   10 reps head turns/nods, partial tandem stance   Standing Eyes Closed  Narrow base of support (BOS);Head turns;Foam/compliant surface;Other reps (comment)   feet together, 10 reps head nods/turns       PT Education - 12/17/19 1246    Education Details  will contact physician again about order for rollator.  Rollator training outside    [employer]) Educated  Patient    Methods  Explanation;Demonstration    Comprehension  Verbalized understanding;Returned demonstration       PT Short Term Goals - 11/20/19 1047      PT SHORT TERM GOAL #1   Title  Pt will participate in further vestibular and balance assessment: DVA and FGA    Baseline  achieved: FGA: 9/30, DVA - 5 line difference    Time  4    Period  Weeks    Status  Achieved      PT SHORT TERM GOAL #2   Title  Pt will demonstrate independence with initial vestibular and balance HEP    Baseline  demonstrates independence with initial HEP    Time  4    Period  Weeks    Status  Achieved      PT SHORT TERM GOAL #3   Title  Pt will report no dizziness with rolling in bed or during supine <> sit    Baseline  none on 4/23    Time  4    Period  Weeks    Status  Achieved      PT SHORT TERM GOAL #4   Title  Pt will ambulate x 230' indoors with L and R turns with cane and no veering to R    Baseline  230' with cane with dizziness with head turns and head nods    Time  4    Period  Weeks    Status  Not Met        PT Long Term Goals - 12/10/19 1254      PT LONG TERM GOAL #1   Title  Pt will be independent with final vestibular and balance HEP  (01/13/20 LTG due)    Baseline  dependent to date    Time  6    Period  Weeks    Status  On-going      PT LONG TERM GOAL #2   Title  Pt will demonstrate improved use of VOR as indicated by 2-3 line difference on DVA    Baseline  5 line  difference    Time  6    Period  Weeks    Status  Revised      PT LONG TERM GOAL #3   Title  Pt will demonstrate 4 point improvement in FGA to indicate decreased falls risk    Baseline  9/30    Time  6    Period  Weeks    Status  New      PT LONG TERM GOAL #4   Title  Pt will ambulate x 230' indoors with L and R turns WITHOUT CANE, independently without veering to R    Baseline  115' with cane with significant veering to R    Time  6    Period  Weeks    Status  On-going            Plan - 12/17/19 1247    Clinical Impression Statement  No further falls and pt reports dizziness has significantly improved.  Pt still reports imbalance on compliant surfaces.  Progressed x1 viewing to narrow BOS and corner balance to partial tandem stance on compliant surface to further progress gaze adaptation and balance training.  Pt demonstrated safe gait outside over variety of surfaces with rollator.  Will continue to address in order to progress towards LTG.    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 ganglia, bilateral thalamus and bilateral cerebellar small infarcts; went to ED 2 years ago for sudden onset dizziness/vomiting    Examination-Activity Limitations  Bed Mobility;Locomotion Level    Examination-Participation Restrictions  Driving    Stability/Clinical Decision Making  Stable/Uncomplicated    Rehab Potential  Good    PT Frequency  1x / week    PT Duration  Other (comment)   1x/week x 3 + 1x/week x 6   PT Treatment/Interventions  ADLs/Self Care Home Management;Canalith Repostioning;DME Instruction;Gait training;Stair training;Functional mobility training;Therapeutic activities;Therapeutic exercise;Balance training;Neuromuscular re-education;Patient/family education;Vestibular    PT Next Visit Plan  Did order for rollator come  in?  balance on compliant surfaces. Progress x1 viewing to compliant surface    PT Home Exercise Plan  x1 viewing, balance on foam, SLS    Consulted and Agree with Plan of Care  Patient       Patient will benefit from skilled therapeutic intervention in order to improve the following deficits and impairments:  Decreased balance, Decreased strength, Dizziness, Difficulty walking  Visit Diagnosis: Dizziness and giddiness  Unsteadiness on feet  Abnormal posture  Difficulty in walking, not elsewhere classified  Muscle weakness (generalized)  Other symptoms and signs involving the musculoskeletal system     Problem List Patient Active Problem List   Diagnosis Date Noted  . 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 Burning chest pain 09/18/2019  . Primary osteoarthritis of right knee 07/09/2018  . Pain of left hip joint 07/09/2018  . Lightheadedness 04/23/2018  . Gait instability 01/08/2018  . Chronic migraine without aura without status migrainosus, not intractable 11/13/2017  . Tension headache 11/13/2017  . Lumbosacral radiculopathy 11/12/2017  . Cerebrovascular accident (CVA) ([place]) 11/08/2017  . Spinal stenosis of lumbar region 11/08/2017  . Routine adult health maintenance 02/13/2016  . Syncope 11/15/2015  . Edema of right foot 01/27/2015  . Intertrigo 12/30/2014  . Stable angina (  [place]) 09/23/2014  . Hot flashes 11/27/2013  . Hair loss 09/10/2013  . Depression 03/18/2013  . Dysuria 06/24/2012  . 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 OVERWEIGHT 05/05/2009  . CONSTIPATION, CHRONIC 02/10/2009  . DYSTHYMIC DISORDER 02/06/2008  . GERD 02/06/2008  . HYPERTENSION, BENIGN 10/09/2006  . Chronic low back pain 10/09/2006    Rico Junker, PT, DPT 12/17/19    12:52 PM    [facility] [address] [place], [place], [address] Phone: [phone]   Fax:  [phone]  Name: Megan Miller MRN: [id number] Date of Birth:  02/16/1956

## 2019-12-17 NOTE — Telephone Encounter (Signed)
Hello Dr. Rachael Darby, I just wanted to make sure you received my response regarding the walker recommendations for [facility] last week.  PT is recommending the use of a four wheeled walker for Ms. Olexa.  With the 4WW she demonstrates increased gait velocity and safety when on uneven, outdoor surfaces.  If you agree, please enter a DME order for a four wheeled walker with seat/brakes.  Thank you for your assistance, Dierdre Highman, PT, DPT 12/17/19    12:56 PM

## 2019-12-21 ENCOUNTER — Telehealth: Payer: Self-pay

## 2019-12-21 NOTE — Telephone Encounter (Signed)
Community message sent to [employer] for rolling walker processing.

## 2019-12-24 ENCOUNTER — Telehealth: Payer: Self-pay | Admitting: Physician Assistant

## 2019-12-24 ENCOUNTER — Ambulatory Visit: Payer: Medicaid Other | Admitting: Physical Therapy

## 2019-12-24 DIAGNOSIS — K219 Gastro-esophageal reflux disease without esophagitis: Secondary | ICD-10-CM

## 2019-12-24 MED ORDER — ACIPHEX 20 MG PO TBEC: 20.0000 mg | DELAYED_RELEASE_TABLET | Freq: Every day | ORAL | 10 refills | Status: DC

## 2019-12-24 NOTE — Telephone Encounter (Signed)
Evern Core, CMA; Geroge Baseman, Barnie Mort, Melissa   Got it, thanks

## 2019-12-24 NOTE — Telephone Encounter (Signed)
New script for brand name Aciphex was faxed to the patient's pharmacy.

## 2019-12-31 ENCOUNTER — Other Ambulatory Visit: Payer: Self-pay

## 2019-12-31 ENCOUNTER — Ambulatory Visit: Payer: Medicaid Other | Attending: Family Medicine | Admitting: Physical Therapy

## 2019-12-31 DIAGNOSIS — R29898 Other symptoms and signs involving the musculoskeletal system: Secondary | ICD-10-CM | POA: Insufficient documentation

## 2019-12-31 DIAGNOSIS — R2681 Unsteadiness on feet: Secondary | ICD-10-CM | POA: Diagnosis present

## 2019-12-31 DIAGNOSIS — R42 Dizziness and giddiness: Secondary | ICD-10-CM

## 2019-12-31 DIAGNOSIS — R262 Difficulty in walking, not elsewhere classified: Secondary | ICD-10-CM | POA: Insufficient documentation

## 2019-12-31 DIAGNOSIS — M6281 Muscle weakness (generalized): Secondary | ICD-10-CM | POA: Diagnosis present

## 2020-01-01 NOTE — Therapy (Signed)
[facility] [address] [place], [place], [address] Phone: [phone]   Fax:  [phone]  Physical Therapy Treatment  Patient Details  Name: Megan Miller MRN: [id number] Date of Birth: 1956-05-26 Referring Provider (PT): Matilde Haymaker, MD - Resident - cosigned by Zenia Resides, MD   Encounter Date: 12/31/2019  PT End of Session - 01/01/20 1333    Visit Number  7    Number of Visits  12    Date for PT Re-Evaluation  01/13/20    Authorization Type  Medicaid    Authorization Time Period  6 visits approved 12/03/19 > 01/13/20    Authorization - Visit Number  3    Authorization - Number of Visits  6    PT Start Time  1202   pt arrived 15" late for 11:45 appt   PT Stop Time  1234    PT Time Calculation (min)  32 min    Activity Tolerance  Patient tolerated treatment well    Behavior During Therapy  [facility] for tasks assessed/performed       Past Medical History:  Diagnosis Date  . Allergic conjunctivitis   . Chest pain   . Chronic back pain   . Constipation, chronic   . Dyspnea on exertion   . Dysthymic   . GERD (gastroesophageal reflux disease)   . Hypertension   . Leg edema    Asymetric Right leg (dopplers negative)  . Overweight (BMI 25.0-29.9)   . Stroke ([place])   . Urination frequency     Past Surgical History:  Procedure Laterality Date  . 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 BILATERAL SALPINGOOPHORECTOMY  05/08/2001   Path=extensive endometriosis  . BREAST SURGERY  12/29/2003   Right breast core biopsy=benign fibrocystic change  . SMALL INTESTINE SURGERY  03/30/2001   Rectosigmoid bowel resection and reanastomosis    There were no vitals filed for this visit.  Subjective Assessment - 12/31/19 1203    Subjective  Pt has obtained rollator for use with long distances - but is not using it today (using SPC) - pt arrives 15" late for appt    Pertinent History  allergic  conjunctivitis, chest pain, chronic back pain, DOE, HTN, LE edema, CVA - Old RIGHT basal ganglia, bilateral thalamus and bilateralcerebellar small infarcts; went to ED 2 years ago for sudden onset dizziness/vomiting    Patient Stated Goals  To stop the dizziness    Currently in Pain?  No/denies    Pain Onset  More than a month ago                        [facility] Adult PT Treatment/Exercise - 01/01/20 0001      Transfers   Transfers  Sit to Stand    Sit to Stand  5: Supervision    Stand to Sit  5: Supervision    Number of Reps  Other reps (comment)   5 reps   Comments  no UE support - feet on floor      Knee/Hip Exercises: Standing   Forward Step Up  Right;Left;1 set;10 reps;Hand Hold: 2   more difficult with LLE than RLE         Balance Exercises - 01/01/20 0001      Balance Exercises: Standing   Standing Eyes Opened  Wide (BOA);Narrow base of support (BOS);Head turns;Foam/compliant surface;5 reps   horizontal & vertical head turns  Standing Eyes Closed  Narrow base of support (BOS);Wide (BOA);Head turns;Foam/compliant surface;5 reps    Rockerboard  Anterior/posterior;Head turns;EO;10 reps;Intermittent UE support    Marching  Foam/compliant surface;Static;Head turns;5 reps   EO 5 reps no Head turns; head turns horizontal & vertical    Other Standing Exercises  trunk rotations standing on pilows in corner 5 reps Rt and Lt sides EO with SBA    Other Standing Exercises Comments  pt performed alternate tap ups to 1st step 5 reps each and then to 2nd step 5 reps each with UE support prn with CGA; performed stepping over and back of balance beam 5 reps each leg with UE support prn          PT Short Term Goals - 01/01/20 1338      PT SHORT TERM GOAL #1   Title  Pt will participate in further vestibular and balance assessment: DVA and FGA    Baseline  achieved: FGA: 9/30, DVA - 5 line difference    Time  4    Period  Weeks    Status  Achieved      PT SHORT  TERM GOAL #2   Title  Pt will demonstrate independence with initial vestibular and balance HEP    Baseline  demonstrates independence with initial HEP    Time  4    Period  Weeks    Status  Achieved      PT SHORT TERM GOAL #3   Title  Pt will report no dizziness with rolling in bed or during supine <> sit    Baseline  none on 4/23    Time  4    Period  Weeks    Status  Achieved      PT SHORT TERM GOAL #4   Title  Pt will ambulate x 230' indoors with L and R turns with cane and no veering to R    Baseline  230' with cane with dizziness with head turns and head nods    Time  4    Period  Weeks    Status  Not Met        PT Long Term Goals - 01/01/20 1338      PT LONG TERM GOAL #1   Title  Pt will be independent with final vestibular and balance HEP  (01/13/20 LTG due)    Baseline  dependent to date    Time  6    Period  Weeks    Status  On-going      PT LONG TERM GOAL #2   Title  Pt will demonstrate improved use of VOR as indicated by 2-3 line difference on DVA    Baseline  5 line difference    Time  6    Period  Weeks    Status  Revised      PT LONG TERM GOAL #3   Title  Pt will demonstrate 4 point improvement in FGA to indicate decreased falls risk    Baseline  9/30    Time  6    Period  Weeks    Status  New      PT LONG TERM GOAL #4   Title  Pt will ambulate x 230' indoors with L and R turns WITHOUT CANE, independently without veering to R    Baseline  115' with cane with significant veering to R    Time  6    Period  Weeks    Status  On-going            Plan - 01/01/20 1335    Clinical Impression Statement  Pt reports she has obtained rollator but is not using this device at today's session - continues to use [facility]; pt states she plans on using rollator when she is going to be ambulating prolonged distances.  Pt reported no dizziness with any of the balance/vestibular exercises - only c/o imbalance and unsteadiness was noted.  LLE noted to be slightly  weaker than RLE with step up exercise.    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 ganglia, bilateral thalamus and bilateral cerebellar small infarcts; went to ED 2 years ago for sudden onset dizziness/vomiting    Examination-Activity Limitations  Bed Mobility;Locomotion Level    Examination-Participation Restrictions  Driving    Stability/Clinical Decision Making  Stable/Uncomplicated    Rehab Potential  Good    PT Frequency  1x / week    PT Duration  Other (comment)   1x/week x 3 + 1x/week x 6   PT Treatment/Interventions  ADLs/Self Care Home Management;Canalith Repostioning;DME Instruction;Gait training;Stair training;Functional mobility training;Therapeutic activities;Therapeutic exercise;Balance training;Neuromuscular re-education;Patient/family education;Vestibular    PT Next Visit Plan  Pt now has rollator but did not bring to today's session - pt only has 1 appt remaining (end date 01-13-20) - D/C next session?    PT Home Exercise Plan  x1 viewing, balance on foam, SLS    Consulted and Agree with Plan of Care  Patient       Patient will benefit from skilled therapeutic intervention in order to improve the following deficits and impairments:  Decreased balance, Decreased strength, Dizziness, Difficulty walking  Visit Diagnosis: Unsteadiness on feet  Dizziness and giddiness     Problem List Patient Active Problem List   Diagnosis Date Noted  . 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 Burning chest pain 09/18/2019  . Primary osteoarthritis of right knee 07/09/2018  . Pain of left hip joint 07/09/2018  . Lightheadedness 04/23/2018  . Gait instability 01/08/2018  . Chronic migraine without aura without status migrainosus, not intractable 11/13/2017  . Tension headache 11/13/2017  . Lumbosacral radiculopathy 11/12/2017  .  Cerebrovascular accident (CVA) ([place]) 11/08/2017  . Spinal stenosis of lumbar region 11/08/2017  . Routine adult health maintenance 02/13/2016  . Syncope 11/15/2015  . Edema of right foot 01/27/2015  . Intertrigo 12/30/2014  . Stable angina ([place]) 09/23/2014  . Hot flashes 11/27/2013  . Hair loss 09/10/2013  . Depression 03/18/2013  . Dysuria 06/24/2012  . 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 OVERWEIGHT 05/05/2009  . CONSTIPATION, CHRONIC 02/10/2009  . DYSTHYMIC DISORDER 02/06/2008  . GERD 02/06/2008  . HYPERTENSION, BENIGN 10/09/2006  . Chronic low back pain 10/09/2006    Alda Lea, PT 01/01/2020, 1:43 PM  [facility] [address] [place], [place], [address] Phone: [phone]   Fax:  [phone]  Name: Megan Miller MRN: [id number] Date of Birth: 1955-09-21

## 2020-01-07 ENCOUNTER — Other Ambulatory Visit: Payer: Self-pay

## 2020-01-07 ENCOUNTER — Ambulatory Visit: Payer: Medicaid Other | Admitting: Physical Therapy

## 2020-01-07 ENCOUNTER — Encounter: Payer: Self-pay | Admitting: Physical Therapy

## 2020-01-07 DIAGNOSIS — R2681 Unsteadiness on feet: Secondary | ICD-10-CM | POA: Diagnosis not present

## 2020-01-07 DIAGNOSIS — M6281 Muscle weakness (generalized): Secondary | ICD-10-CM

## 2020-01-07 DIAGNOSIS — R42 Dizziness and giddiness: Secondary | ICD-10-CM

## 2020-01-07 DIAGNOSIS — R29898 Other symptoms and signs involving the musculoskeletal system: Secondary | ICD-10-CM

## 2020-01-07 DIAGNOSIS — R262 Difficulty in walking, not elsewhere classified: Secondary | ICD-10-CM

## 2020-01-07 NOTE — Therapy (Signed)
[facility] [address] [place], [place], [address] Phone: [phone]   Fax:  [phone]  Physical Therapy Treatment  Patient Details  Name: Megan Miller MRN: [id number] Date of Birth: Apr 07, 1956 Referring Provider (PT): Matilde Haymaker, MD - Resident - cosigned by Zenia Resides, MD  Encounter Date: 01/07/2020   PT End of Session - 01/07/20 1256    Visit Number 8    Number of Visits 12    Date for PT Re-Evaluation 01/13/20    Authorization Type Medicaid    Authorization Time Period 6 visits approved 12/03/19 > 01/13/20    Authorization - Visit Number 4    Authorization - Number of Visits 6    PT Start Time 4166   arrived late   PT Stop Time 1230    PT Time Calculation (min) 26 min    Activity Tolerance Patient tolerated treatment well    Behavior During Therapy [facility] for tasks assessed/performed           Past Medical History:  Diagnosis Date  . Allergic conjunctivitis   . Chest pain   . Chronic back pain   . Constipation, chronic   . Dyspnea on exertion   . Dysthymic   . GERD (gastroesophageal reflux disease)   . Hypertension   . Leg edema    Asymetric Right leg (dopplers negative)  . Overweight (BMI 25.0-29.9)   . Stroke ([place])   . Urination frequency     Past Surgical History:  Procedure Laterality Date  . 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 BILATERAL SALPINGOOPHORECTOMY  05/08/2001   Path=extensive endometriosis  . BREAST SURGERY  12/29/2003   Right breast core biopsy=benign fibrocystic change  . SMALL INTESTINE SURGERY  03/30/2001   Rectosigmoid bowel resection and reanastomosis    There were no vitals filed for this visit.   Subjective Assessment - 01/07/20 1212    Subjective Arrivd 15 minutes late.  Still using cane due to rain today.  Would like to bring rollator in to check the height.  Will schedule one more visit for next week and then will take a break.  Pt now  has medicare part B and would like to do therapy again after a break to work on balance.    Pertinent History allergic conjunctivitis, chest pain, chronic back pain, DOE, HTN, LE edema, CVA - Old RIGHT basal ganglia, bilateral thalamus and bilateralcerebellar small infarcts; went to ED 2 years ago for sudden onset dizziness/vomiting    Patient Stated Goals To stop the dizziness    Currently in Pain? No/denies    Pain Onset More than a month ago              [facility] PT Assessment - 01/07/20 1214      Assessment   Medical Diagnosis Vestibular Neuronitis      Ambulation/Gait   Ambulation/Gait Yes    Ambulation/Gait Assistance 6: Modified independent (Device/Increase time);5: Supervision    Ambulation/Gait Assistance Details with cane and rollator pt is Mod I with little to no veering; without cane pt is supervision due to increased veering    Ambulation Distance (Feet) 230 Feet    Assistive device Straight cane;None    Gait Pattern Step-through pattern;Lateral trunk lean to right;Narrow base of support    Ambulation Surface Level;Indoor    Stairs Yes    Stairs Assistance 5: Supervision;4: Min assist    Stairs Assistance Details (indicate cue type and reason) min  A when performing alternating sequence due to L knee instability and weakness; supervision with step to sequence    Stair Management Technique One rail Right;One rail Left;Alternating pattern;Step to pattern;Forwards;With cane    Number of Stairs 8    Height of Stairs 6      Functional Gait  Assessment   Gait assessed  Yes    Gait Level Surface Walks 20 ft in less than 7 sec but greater than 5.5 sec, uses assistive device, slower speed, mild gait deviations, or deviates 6-10 in outside of the 12 in walkway width.    Change in Gait Speed Able to change speed, demonstrates mild gait deviations, deviates 6-10 in outside of the 12 in walkway width, or no gait deviations, unable to achieve a major change in velocity, or uses a change  in velocity, or uses an assistive device.    Gait with Horizontal Head Turns Performs head turns smoothly with slight change in gait velocity (eg, minor disruption to smooth gait path), deviates 6-10 in outside 12 in walkway width, or uses an assistive device.    Gait with Vertical Head Turns Performs task with slight change in gait velocity (eg, minor disruption to smooth gait path), deviates 6 - 10 in outside 12 in walkway width or uses assistive device    Gait and Pivot Turn Pivot turns safely in greater than 3 sec and stops with no loss of balance, or pivot turns safely within 3 sec and stops with mild imbalance, requires small steps to catch balance.    Step Over Obstacle Is able to step over one shoe box (4.5 in total height) without changing gait speed. No evidence of imbalance.    Gait with Narrow Base of Support Ambulates less than 4 steps heel to toe or cannot perform without assistance.    Gait with Eyes Closed Walks 20 ft, uses assistive device, slower speed, mild gait deviations, deviates 6-10 in outside 12 in walkway width. Ambulates 20 ft in less than 9 sec but greater than 7 sec.    Ambulating Backwards Walks 20 ft, uses assistive device, slower speed, mild gait deviations, deviates 6-10 in outside 12 in walkway width.    Steps Two feet to a stair, must use rail.    Total Score 17    FGA comment: 17/30               Vestibular Assessment - 01/07/20 1225      Visual Acuity   Static 7    Dynamic 4   3 line difference                           PT Education - 01/07/20 1257    Education Details progress towards goals, focus of next session and then will place pt on hold.  Pt would like to continue to work on balance but would like to take a break from therapy for a bit    Person(s) Educated Patient    Methods Explanation    Comprehension Verbalized understanding            PT Short Term Goals - 01/01/20 1338      PT SHORT TERM GOAL #1   Title Pt  will participate in further vestibular and balance assessment: DVA and FGA    Baseline achieved: FGA: 9/30, DVA - 5 line difference    Time 4    Period Weeks    Status Achieved  PT SHORT TERM GOAL #2   Title Pt will demonstrate independence with initial vestibular and balance HEP    Baseline demonstrates independence with initial HEP    Time 4    Period Weeks    Status Achieved      PT SHORT TERM GOAL #3   Title Pt will report no dizziness with rolling in bed or during supine <> sit    Baseline none on 4/23    Time 4    Period Weeks    Status Achieved      PT SHORT TERM GOAL #4   Title Pt will ambulate x 230' indoors with L and R turns with cane and no veering to R    Baseline 230' with cane with dizziness with head turns and head nods    Time 4    Period Weeks    Status Not Met             PT Long Term Goals - 01/07/20 1213      PT LONG TERM GOAL #1   Title Pt will be independent with final vestibular and balance HEP  (01/13/20 LTG due)    Baseline dependent to date    Time 6    Period Weeks    Status On-going      PT LONG TERM GOAL #2   Title Pt will demonstrate improved use of VOR as indicated by 2-3 line difference on DVA    Baseline 5 line difference > 3 line difference    Time 6    Period Weeks    Status Achieved      PT LONG TERM GOAL #3   Title Pt will demonstrate 4 point improvement in FGA to indicate decreased falls risk    Baseline 9/30 > 17/30    Time 6    Period Weeks    Status Achieved      PT LONG TERM GOAL #4   Title Pt will ambulate x 230' indoors with L and R turns WITHOUT CANE, independently without veering to R    Baseline can ambulate now without cane but continues to veer to R; has rollator for community gait    Time 6    Period Weeks    Status Not Met                 Plan - 01/07/20 1258    Clinical Impression Statement Initiated assessment of progress towards LTG.  Pt has met FGA goal and DVA goal demonstrating  improved balance, decreased falls risk and improve gaze stabiltiy.  Pt has also obtained a rollator for safer community mobility but did not bring rollator today.  Scheduled one more visit for next week to check height of rollator, perform further education and to finalize HEP for pt to perform while on hold from therapy.  Pt would like to continue balance training but would like to take a break from therapy for a while.  No dizziness today.  Continues to demonstrate LLE weakness > RLE.    Personal Factors and Comorbidities Comorbidity 3+;Finances;Past/Current Experience    Comorbidities allergic conjunctivitis, chest pain, chronic back pain, DOE, HTN, LE edema, CVA - Old RIGHT basal ganglia, bilateral thalamus and bilateral cerebellar small infarcts; went to ED 2 years ago for sudden onset dizziness/vomiting    Examination-Activity Limitations Bed Mobility;Locomotion Level    Examination-Participation Restrictions Driving    Stability/Clinical Decision Making Stable/Uncomplicated    Rehab Potential Good    PT Frequency  1x / week    PT Duration Other (comment)   1x/week x 3 + 1x/week x 6   PT Treatment/Interventions ADLs/Self Care Home Management;Canalith Repostioning;DME Instruction;Gait training;Stair training;Functional mobility training;Therapeutic activities;Therapeutic exercise;Balance training;Neuromuscular re-education;Patient/family education;Vestibular    PT Next Visit Plan check height of rollator, finalize HEP and place on hold.  work on LLE strength, standing balance with decreased UE support, gait with rollator    PT Home Exercise Plan x1 viewing, balance on foam, SLS    Consulted and Agree with Plan of Care Patient           Patient will benefit from skilled therapeutic intervention in order to improve the following deficits and impairments:  Decreased balance, Decreased strength, Dizziness, Difficulty walking  Visit Diagnosis: Unsteadiness on feet  Dizziness and  giddiness  Difficulty in walking, not elsewhere classified  Muscle weakness (generalized)  Other symptoms and signs involving the musculoskeletal system     Problem List Patient Active Problem List   Diagnosis Date Noted  . 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 Burning chest pain 09/18/2019  . Primary osteoarthritis of right knee 07/09/2018  . Pain of left hip joint 07/09/2018  . Lightheadedness 04/23/2018  . Gait instability 01/08/2018  . Chronic migraine without aura without status migrainosus, not intractable 11/13/2017  . Tension headache 11/13/2017  . Lumbosacral radiculopathy 11/12/2017  . Cerebrovascular accident (CVA) ([place]) 11/08/2017  . Spinal stenosis of lumbar region 11/08/2017  . Routine adult health maintenance 02/13/2016  . Syncope 11/15/2015  . Edema of right foot 01/27/2015  . Intertrigo 12/30/2014  . Stable angina ([place]) 09/23/2014  . Hot flashes 11/27/2013  . Hair loss 09/10/2013  . Depression 03/18/2013  . Dysuria 06/24/2012  . 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 OVERWEIGHT 05/05/2009  . CONSTIPATION, CHRONIC 02/10/2009  . DYSTHYMIC DISORDER 02/06/2008  . GERD 02/06/2008  . HYPERTENSION, BENIGN 10/09/2006  . Chronic low back pain 10/09/2006    Rico Junker, PT, DPT 01/07/20    1:02 PM    [facility] [address] [place], [place], [address] Phone: [phone]   Fax:  [phone]  Name: Megan Miller MRN: [id number] Date of Birth: 22-Jan-1956

## 2020-01-13 ENCOUNTER — Ambulatory Visit: Payer: Medicaid Other | Admitting: Physical Therapy

## 2020-01-13 ENCOUNTER — Encounter: Payer: Self-pay | Admitting: Physical Therapy

## 2020-01-13 ENCOUNTER — Other Ambulatory Visit: Payer: Self-pay

## 2020-01-13 DIAGNOSIS — R2681 Unsteadiness on feet: Secondary | ICD-10-CM | POA: Diagnosis not present

## 2020-01-13 DIAGNOSIS — R262 Difficulty in walking, not elsewhere classified: Secondary | ICD-10-CM

## 2020-01-13 DIAGNOSIS — M6281 Muscle weakness (generalized): Secondary | ICD-10-CM

## 2020-01-13 DIAGNOSIS — R42 Dizziness and giddiness: Secondary | ICD-10-CM

## 2020-01-13 DIAGNOSIS — R29898 Other symptoms and signs involving the musculoskeletal system: Secondary | ICD-10-CM

## 2020-01-13 NOTE — Therapy (Signed)
[facility] [address] [place], [place], [address] Phone: [phone]   Fax:  [phone]  Physical Therapy Treatment  Patient Details  Name: Megan Miller MRN: [id number] Date of Birth: Jul 18, 1956 Referring Provider (PT): Matilde Haymaker, MD - Resident - cosigned by Zenia Resides, MD   Encounter Date: 01/13/2020   PT End of Session - 01/13/20 1230    Visit Number 9    Number of Visits 12    Date for PT Re-Evaluation 01/13/20    Authorization Type Medicaid    Authorization Time Period 6 visits approved 12/03/19 > 01/13/20    Authorization - Visit Number 5    Authorization - Number of Visits 6    PT Start Time 1146    PT Stop Time 1225    PT Time Calculation (min) 39 min    Activity Tolerance Patient tolerated treatment well    Behavior During Therapy [facility] for tasks assessed/performed           Past Medical History:  Diagnosis Date  . Allergic conjunctivitis   . Chest pain   . Chronic back pain   . Constipation, chronic   . Dyspnea on exertion   . Dysthymic   . GERD (gastroesophageal reflux disease)   . Hypertension   . Leg edema    Asymetric Right leg (dopplers negative)  . Overweight (BMI 25.0-29.9)   . Stroke ([place])   . Urination frequency     Past Surgical History:  Procedure Laterality Date  . 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 BILATERAL SALPINGOOPHORECTOMY  05/08/2001   Path=extensive endometriosis  . BREAST SURGERY  12/29/2003   Right breast core biopsy=benign fibrocystic change  . SMALL INTESTINE SURGERY  03/30/2001   Rectosigmoid bowel resection and reanastomosis    There were no vitals filed for this visit.   Subjective Assessment - 01/13/20 1154    Subjective Brought rollator today to have it adjusted.  "Can I leave a little bit early, I have to go somewhere else."    Pertinent History allergic conjunctivitis, chest pain, chronic back pain, DOE, HTN, LE edema,  CVA - Old RIGHT basal ganglia, bilateral thalamus and bilateralcerebellar small infarcts; went to ED 2 years ago for sudden onset dizziness/vomiting    Patient Stated Goals To stop the dizziness    Currently in Pain? No/denies    Pain Onset More than a month ago                             [facility] Adult PT Treatment/Exercise - 01/13/20 1227      Ambulation/Gait   Ambulation/Gait Yes    Ambulation/Gait Assistance 6: Modified independent (Device/Increase time);5: Supervision    Ambulation/Gait Assistance Details with rollator at beginning of session pt demonstrated more flexed posture with rollator at current height    Ambulation Distance (Feet) 230 Feet    Assistive device Rollator    Gait Pattern Step-through pattern;Trunk flexed    Ambulation Surface Level;Indoor    Pre-Gait Activities evaluated current height of rollator and adjusted to appropriate height for pt. Pt demonstrated improved upright posture with height adjusted           TO DECREASE IRRITATION ON THE BLADDER  TRY to stop drinking fluids by 8-9 pm and go to the bathroom before going to bed at night.  When you wake up in the morning do this stretch to help with  spasms:  Hamstring Stretch: Active    Support behind right knee. Starting with knee bent, attempt to straighten knee until a comfortable stretch is felt in back of thigh. Perform 10 times on the Left leg.  Perform 10 times on the Right leg.  Stand at the edge of your bed holding on to the walker for support.  Let your legs relax before trying to walk to the bathroom.   Ankle Stretch at [employer] in a standing upright position at your counter, lower cabinet open and toes propped up on lower shelf.  Movement Keep your knee straight, stand up straight and bring your hips forwards until you feel a stretch in the back of your lower legs, and hold 10-15 seconds.  Switch feet and repeat.  Perform 2-3 times each side.  Hip  Abduction (Standing)    Stand with support. Lift right leg out to side, keeping toe forward.  Perform 10 on each side.  Remember to stand up straight.  Repeat _10__ times   Squat holding counter top    Feet shoulder width apart, holding counter for support, sit hips back (like you are reaching for a chair).  Hold squat 2-3 seconds and then return to standing. Repeat _10__ times per set.    Feet Partial Heel-Toe (Compliant Surface) Head Motion - Eyes Open    With eyes open, standing on compliant surface: pillow, right foot partially in front of the other, move head slowly: up and down 10 times.  Side to side 10 times.  Move right foot back, left foot forwards and repeat 5 head nods slowly, 5 head turns slowly.   Repeat 2 times per session. Do 2 sessions per day.   Gaze Stabilization: Standing Feet Together (Compliant Surface)    Feet together on pillow, keeping eyes on target on wall __3__ feet away, tilt head down 15-30 and move head side to side for __60__ seconds. Repeat while moving head up and down for __60__ seconds. Do __2__ sessions per day.    PT Education - 01/13/20 1229    Education Details rollator height; final HEP, PT on hold until pt ready to return to continue to address balance    Person(s) Educated Patient    Methods Explanation;Demonstration;Handout    Comprehension Verbalized understanding;Returned demonstration            PT Short Term Goals - 01/01/20 1338      PT SHORT TERM GOAL #1   Title Pt will participate in further vestibular and balance assessment: DVA and FGA    Baseline achieved: FGA: 9/30, DVA - 5 line difference    Time 4    Period Weeks    Status Achieved      PT SHORT TERM GOAL #2   Title Pt will demonstrate independence with initial vestibular and balance HEP    Baseline demonstrates independence with initial HEP    Time 4    Period Weeks    Status Achieved      PT SHORT TERM GOAL #3   Title Pt will report no dizziness  with rolling in bed or during supine <> sit    Baseline none on 4/23    Time 4    Period Weeks    Status Achieved      PT SHORT TERM GOAL #4   Title Pt will ambulate x 230' indoors with L and R turns with cane and no veering to R    Baseline  59' with cane with dizziness with head turns and head nods    Time 4    Period Weeks    Status Not Met             PT Long Term Goals - 01/13/20 1231      PT LONG TERM GOAL #1   Title Pt will be independent with final vestibular and balance HEP  (01/13/20 LTG due)    Baseline dependent to date    Time 6    Period Weeks    Status Achieved      PT LONG TERM GOAL #2   Title Pt will demonstrate improved use of VOR as indicated by 2-3 line difference on DVA    Baseline 5 line difference > 3 line difference    Time 6    Period Weeks    Status Achieved      PT LONG TERM GOAL #3   Title Pt will demonstrate 4 point improvement in FGA to indicate decreased falls risk    Baseline 9/30 > 17/30    Time 6    Period Weeks    Status Achieved      PT LONG TERM GOAL #4   Title Pt will ambulate x 230' indoors with L and R turns WITHOUT CANE, independently without veering to R    Baseline can ambulate now without cane but continues to veer to R; has rollator for community gait    Time 6    Period Weeks    Status Not Met                 Plan - 01/13/20 1231    Clinical Impression Statement Pt has made good progress and has met all LTG with review and upgrade of HEP today.  Provided pt with tactile and visual cues for correct performance of new LE strengthening exercises and upgraded balance challenge for x1 viewing.  Pt demonstrates independent gait with rollator.  Pt continues to demonstrate higher level balance impairments and LE weakness and would benefit from further skilled PT interventions but pt requested to take a break from PT for now but would like to return for further PT.  Will place pt on hold for now; will re-assess and restart  PT when pt is able.    Personal Factors and Comorbidities Comorbidity 3+;Finances;Past/Current Experience    Comorbidities allergic conjunctivitis, chest pain, chronic back pain, DOE, HTN, LE edema, CVA - Old RIGHT basal ganglia, bilateral thalamus and bilateral cerebellar small infarcts; went to ED 2 years ago for sudden onset dizziness/vomiting    Examination-Activity Limitations Bed Mobility;Locomotion Level    Examination-Participation Restrictions Driving    Stability/Clinical Decision Making Stable/Uncomplicated    Rehab Potential Good    PT Frequency 1x / week    PT Duration Other (comment)   1x/week x 3 + 1x/week x 6   PT Treatment/Interventions ADLs/Self Care Home Management;Canalith Repostioning;DME Instruction;Gait training;Stair training;Functional mobility training;Therapeutic activities;Therapeutic exercise;Balance training;Neuromuscular re-education;Patient/family education;Vestibular    PT Next Visit Plan Pt on hold. when returns - re-assess and reset goals, recert, revise HEP.  Bill under Medicare    PT Home Exercise Plan x1 viewing, balance on foam, SLS    Consulted and Agree with Plan of Care Patient           Patient will benefit from skilled therapeutic intervention in order to improve the following deficits and impairments:  Decreased balance, Decreased strength, Dizziness, Difficulty walking  Visit Diagnosis: Unsteadiness on feet  Dizziness and giddiness  Difficulty in walking, not elsewhere classified  Muscle weakness (generalized)  Other symptoms and signs involving the musculoskeletal system     Problem List Patient Active Problem List   Diagnosis Date Noted  . 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 Burning chest pain 09/18/2019  . Primary osteoarthritis of right knee 07/09/2018  . Pain of left hip joint 07/09/2018  . Lightheadedness 04/23/2018  . Gait instability 01/08/2018  . Chronic migraine without aura without status migrainosus, not intractable  11/13/2017  . Tension headache 11/13/2017  . Lumbosacral radiculopathy 11/12/2017  . Cerebrovascular accident (CVA) ([place]) 11/08/2017  . Spinal stenosis of lumbar region 11/08/2017  . Routine adult health maintenance 02/13/2016  . Syncope 11/15/2015  . Edema of right foot 01/27/2015  . Intertrigo 12/30/2014  . Stable angina ([place]) 09/23/2014  . Hot flashes 11/27/2013  . Hair loss 09/10/2013  . Depression 03/18/2013  . Dysuria 06/24/2012  . 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 OVERWEIGHT 05/05/2009  . CONSTIPATION, CHRONIC 02/10/2009  . DYSTHYMIC DISORDER 02/06/2008  . GERD 02/06/2008  . HYPERTENSION, BENIGN 10/09/2006  . Chronic low back pain 10/09/2006    Rico Junker, PT, DPT 01/13/20    12:37 PM    [facility] [address] [place], [place], [address] Phone: [phone]   Fax:  [phone]  Name: AMARIE VILES MRN: [id number] Date of Birth: [birth year]

## 2020-01-13 NOTE — Patient Instructions (Addendum)
TO DECREASE IRRITATION ON THE BLADDER  TRY to stop drinking fluids by 8-9 pm and go to the bathroom before going to bed at night.  When you wake up in the morning do this stretch to help with spasms:  Hamstring Stretch: Active    Support behind right knee. Starting with knee bent, attempt to straighten knee until a comfortable stretch is felt in back of thigh. Perform 10 times on the Left leg.  Perform 10 times on the Right leg.  Stand at the edge of your bed holding on to the walker for support.  Let your legs relax before trying to walk to the bathroom.   Ankle Stretch at [employer] Begin in a standing upright position at your counter, lower cabinet open and toes propped up on lower shelf.  Movement Keep your knee straight, stand up straight and bring your hips forwards until you feel a stretch in the back of your lower legs, and hold 10-15 seconds.  Switch feet and repeat.  Perform 2-3 times each side.  Hip Abduction (Standing)    Stand with support. Lift right leg out to side, keeping toe forward.  Perform 10 on each side.  Remember to stand up straight.  Repeat _10__ times   Squat holding counter top    Feet shoulder width apart, holding counter for support, sit hips back (like you are reaching for a chair).  Hold squat 2-3 seconds and then return to standing. Repeat _10__ times per set.    Feet Partial Heel-Toe (Compliant Surface) Head Motion - Eyes Open    With eyes open, standing on compliant surface: pillow, right foot partially in front of the other, move head slowly: up and down 10 times.  Side to side 10 times.  Move right foot back, left foot forwards and repeat 5 head nods slowly, 5 head turns slowly.   Repeat 2 times per session. Do 2 sessions per day.   Gaze Stabilization: Standing Feet Together (Compliant Surface)    Feet together on pillow, keeping eyes on target on wall __3__ feet away, tilt head down 15-30 and move head side to  side for __60__ seconds. Repeat while moving head up and down for __60__ seconds. Do __2__ sessions per day.

## 2020-01-29 ENCOUNTER — Ambulatory Visit (INDEPENDENT_AMBULATORY_CARE_PROVIDER_SITE_OTHER): Payer: Medicaid Other | Admitting: Family Medicine

## 2020-01-29 ENCOUNTER — Encounter: Payer: Self-pay | Admitting: Family Medicine

## 2020-01-29 ENCOUNTER — Other Ambulatory Visit: Payer: Self-pay

## 2020-01-29 VITALS — BP 138/85 | HR 89 | Ht 61.0 in | Wt 184.0 lb

## 2020-01-29 DIAGNOSIS — R252 Cramp and spasm: Secondary | ICD-10-CM | POA: Diagnosis present

## 2020-01-29 LAB — POCT GLYCOSYLATED HEMOGLOBIN (HGB A1C): Hemoglobin A1C: 5.3 % (ref 4.0–5.6)

## 2020-01-29 MED ORDER — GABAPENTIN 100 MG PO CAPS: 100.0000 mg | ORAL_CAPSULE | Freq: Three times a day (TID) | ORAL | 0 refills | Status: DC | PRN

## 2020-01-29 NOTE — Patient Instructions (Signed)
It was great seeing you today!  Please check-out at the front desk before leaving the clinic. I'd like to see you back in 6 months for follow up but if you need to be seen earlier than that for any new issues we're happy to fit you in, just give [place] a call!  Visit Remembers: - Stop by the pharmacy to pick up your prescriptions  - Continue to work on your healthy eating habits and incorporating exercise into your daily life.  - Medicine Changes: Start Gabapentin for leg cramping   Regarding lab work today:  Due to recent changes in healthcare laws, you may see the results of your imaging and laboratory studies on MyChart before your provider has had a chance to review them.  I understand that in some cases there may be results that are confusing or concerning to you. Not all laboratory results come back in the same time frame and you may be waiting for multiple results in order to interpret others.  Please give [place] 72 hours in order for your provider to thoroughly review all the results before contacting the office for clarification of your results. If everything is normal, you will get a letter in the mail or a message in My Chart. Please give [place] a call if you do not hear from [place] after 2 weeks.  Please bring all of your medications with you to each visit.    If you haven't already, sign up for My Chart to have easy access to your labs results, and communication with your primary care physician.  Feel free to call with any questions or concerns at any time, at [phone].   Take care,  Dr. Katherina [facility]

## 2020-01-29 NOTE — Progress Notes (Signed)
    SUBJECTIVE:   CHIEF COMPLAINT / HPI:   CC: leg cramps   Leg Cramping Patient with intermittent leg cramping for the past 6 months. Burning type pain occurs in both legs. Reports it can occur while driving and walking. Does not wake her up out of her sleep but feels it when she is in bed at night.  Denies leg swelling, hx of smoking or diabetes. Uses a cane to ambulate and does exercises with physical therapy.  Takes Asprin for cramping. Denies recent falls.     PERTINENT  PMH / PSH:  Gait instability, hypokalemia    OBJECTIVE:   BP 138/85   Pulse 89   Ht 5\' 1"  (1.549 m)   Wt 184 lb (83.5 kg)   SpO2 98%   BMI 34.77 kg/m    GEN: pleasant elderly female, in no acute distress  CV: regular rate and rhythm, no murmurs appreciated  RESP: no increased work of breathing, clear to ascultation bilaterally MSK:  Has cane, no lower extremity edema, mild LE tenderness bilaterally  SKIN: warm, dry, no extremity rash or redness  NEURO: grossly normal, moves all extremities appropriately PSYCH: Normal affect, appropriate speech and behavior    ASSESSMENT/PLAN:   Bilateral leg cramps Patient with hx of hypokalemia which could be the cause of her leg cramps. Per chart review, leg cramping is not new.  Has been Rx Baclofen and Gabapentin in the past.  Patient is not currently taking this medication.   Will obtain CMP, CBC and a1c.  DDx: hypokalemia, diabetic neuropathy, anemia, rhabdomyolysis, PAD,age related deconditioning  - Consider ABIs  - Consider CK  - Follow up on CMP, CBC, a1c  - Restart Gabapentin 100 mg TID     , DO [place] [facility]

## 2020-01-29 NOTE — Assessment & Plan Note (Addendum)
Patient with hx of hypokalemia which could be the cause of her leg cramps. Per chart review, leg cramping is not new.  Has been Rx Baclofen and Gabapentin in the past.  Patient is not currently taking this medication.   Will obtain CMP, CBC and a1c.  DDx: hypokalemia, diabetic neuropathy, anemia, rhabdomyolysis, PAD,age related deconditioning  - Consider ABIs  - Consider CK  - Follow up on CMP, CBC, a1c  - Restart Gabapentin 100 mg TID

## 2020-01-30 LAB — COMPREHENSIVE METABOLIC PANEL
ALT: 10 IU/L (ref 0–32)
AST: 16 IU/L (ref 0–40)
Albumin/Globulin Ratio: 1.2 (ref 1.2–2.2)
Albumin: 4 g/dL (ref 3.8–4.8)
Alkaline Phosphatase: 117 IU/L (ref 48–121)
BUN/Creatinine Ratio: 12 (ref 12–28)
BUN: 13 mg/dL (ref 8–27)
Bilirubin Total: 0.5 mg/dL (ref 0.0–1.2)
CO2: 21 mmol/L (ref 20–29)
Calcium: 9.3 mg/dL (ref 8.7–10.3)
Chloride: 106 mmol/L (ref 96–106)
Creatinine, Ser: 1.05 mg/dL — ABNORMAL HIGH (ref 0.57–1.00)
GFR calc Af Amer: 65 mL/min/{1.73_m2} (ref >59)
GFR calc non Af Amer: 56 mL/min/{1.73_m2} — ABNORMAL LOW (ref >59)
Globulin, Total: 3.3 g/dL (ref 1.5–4.5)
Glucose: 98 mg/dL (ref 65–99)
Potassium: 4.4 mmol/L (ref 3.5–5.2)
Sodium: 141 mmol/L (ref 134–144)
Total Protein: 7.3 g/dL (ref 6.0–8.5)

## 2020-01-30 LAB — CBC
Hematocrit: 37.2 % (ref 34.0–46.6)
Hemoglobin: 12 g/dL (ref 11.1–15.9)
MCH: 27 pg (ref 26.6–33.0)
MCHC: 32.3 g/dL (ref 31.5–35.7)
MCV: 84 fL (ref 79–97)
Platelets: 298 10*3/uL (ref 150–450)
RBC: 4.44 x10E6/uL (ref 3.77–5.28)
RDW: 13.3 % (ref 11.7–15.4)
WBC: 6.7 10*3/uL (ref 3.4–10.8)

## 2020-02-11 ENCOUNTER — Telehealth: Payer: Self-pay

## 2020-02-11 NOTE — Telephone Encounter (Signed)
Patient calls nurse line requesting referral to Dermatology. Patient reports she "mentioned" this briefly to PCP on 7/2 OV, however I see no notes on this. Patient reports she has been "very" itching in the middle of her chest, in between her breasts. Patient has not tried anything for relief. Patient denies changes detergents or soaps. Will send to PCP for next steps.

## 2020-03-18 ENCOUNTER — Other Ambulatory Visit: Payer: Self-pay

## 2020-03-18 ENCOUNTER — Ambulatory Visit (INDEPENDENT_AMBULATORY_CARE_PROVIDER_SITE_OTHER): Payer: Medicare Other | Admitting: Family Medicine

## 2020-03-18 ENCOUNTER — Encounter: Payer: Self-pay | Admitting: Family Medicine

## 2020-03-18 VITALS — BP 130/82 | HR 75 | Wt 183.0 lb

## 2020-03-18 DIAGNOSIS — R252 Cramp and spasm: Secondary | ICD-10-CM | POA: Diagnosis not present

## 2020-03-18 DIAGNOSIS — L659 Nonscarring hair loss, unspecified: Secondary | ICD-10-CM

## 2020-03-18 DIAGNOSIS — L299 Pruritus, unspecified: Secondary | ICD-10-CM

## 2020-03-18 DIAGNOSIS — I120 Hypertensive chronic kidney disease with stage 5 chronic kidney disease or end stage renal disease: Secondary | ICD-10-CM

## 2020-03-18 DIAGNOSIS — N183 Chronic kidney disease, stage 3 unspecified: Secondary | ICD-10-CM | POA: Diagnosis not present

## 2020-03-18 DIAGNOSIS — R7989 Other specified abnormal findings of blood chemistry: Secondary | ICD-10-CM

## 2020-03-18 DIAGNOSIS — I10 Essential (primary) hypertension: Secondary | ICD-10-CM | POA: Diagnosis not present

## 2020-03-18 MED ORDER — FAMOTIDINE 20 MG PO TABS: 20.0000 mg | ORAL_TABLET | Freq: Two times a day (BID) | ORAL | 3 refills | Status: DC

## 2020-03-18 MED ORDER — TRIAMCINOLONE ACETONIDE 0.1 % EX CREA: 1.0000 | TOPICAL_CREAM | Freq: Two times a day (BID) | CUTANEOUS | 0 refills | Status: DC

## 2020-03-18 MED ORDER — GABAPENTIN 100 MG PO CAPS: 100.0000 mg | ORAL_CAPSULE | Freq: Three times a day (TID) | ORAL | 0 refills | Status: DC | PRN

## 2020-03-18 NOTE — Progress Notes (Signed)
   SUBJECTIVE:   CHIEF COMPLAINT / HPI:   Chief Complaint  Patient presents with  . skin concerns     Megan Miller is a 64 y.o. female here for itching and hair loss.   Itching Patient reports bilateral arm itching and itching between her breasts.  There has not been any rash in this location. She has tried a cream in the past that has helped somewhat.  She requested referral to dermatology.  Denies any bug bites.   Hair loss Patient reports progressive hair loss.  Does not note any bald spots.  Reports thinning of hair and hair coming out in her comb.    PERTINENT  PMH / PSH:  reviewed and updated as appropriate   OBJECTIVE:   BP 130/82   Pulse 75   Wt 183 lb (83 kg)   SpO2 97%   BMI 34.58 kg/m    GEN: Well-appearing older female, in no acute distress  HENT: Normal hair spacing. Minimal strands of hair removed on inspection .  CV: regular rate and rhythm, no murmurs appreciated  RESP: no increased work of breathing, clear to ascultation bilaterally ABD: Bowel sounds present. Soft, Nontender, Nondistended.  No hepatosplenomegaly SKIN: warm, dry, no lesion appreciated, no excoriations      ASSESSMENT/PLAN:   Hair loss Patient with ongoing hair loss.  Previously used Rogaine.  Etiology likely due to age.  Most recent CBC without evidence of anemia.  No known trauma.  No evidence of fungal infection seen on scalp.  Appears this is been an ongoing problem since 2015. -Dermatology referral placed, per patient request   Leg cramping Uncontrolled  - Obtain BMP to check electrolytes  - Restart Gabapentin 100 mg TID.   Itching Uncontrolled.  No lesions / rash on exam.  LFTs in July were unremarkable.  Etiology unclear. DDX: Drug-induced, insect related, psych induced / formication, RUQ pathology.  Contact dermatitis, eczema, dry skin -Start Pepcid -Gabapentin 100 mg 3 times daily -Kenalog 0.1% cream -Referral placed for dermatology       [name],  DO PGY-2, [facility] 03/18/2020

## 2020-03-18 NOTE — Patient Instructions (Signed)
It was great seeing you today!  Please check-out at the front desk before leaving the clinic.   Visit Remembers: - Stop by the pharmacy to pick up your prescription; take Pepcid twice a day, Gabapentin three times a day for pain and itching  - Continue to work on your healthy eating habits and incorporating exercise into your daily life. - Your goal is to have an BP <120/80 - I placed a referral to dermatology to discuss hair loss, chronic skin itching    Regarding lab work today:  Due to recent changes in healthcare laws, you may see the results of your imaging and laboratory studies on MyChart before your provider has had a chance to review them.  I understand that in some cases there may be results that are confusing or concerning to you. Not all laboratory results come back in the same time frame and you may be waiting for multiple results in order to interpret others.  Please give [place] 72 hours in order for your provider to thoroughly review all the results before contacting the office for clarification of your results. If everything is normal, you will get a letter in the mail or a message in My Chart. Please give [place] a call if you do not hear from [place] after 2 weeks.  Please bring all of your medications with you to each visit.    If you haven't already, sign up for My Chart to have easy access to your labs results, and communication with your primary care physician.  Feel free to call with any questions or concerns at any time, at [phone].   Take care,  Dr. Katherina [facility]

## 2020-03-19 LAB — BASIC METABOLIC PANEL
BUN/Creatinine Ratio: 14 (ref 12–28)
BUN: 11 mg/dL (ref 8–27)
CO2: 22 mmol/L (ref 20–29)
Calcium: 9.2 mg/dL (ref 8.7–10.3)
Chloride: 105 mmol/L (ref 96–106)
Creatinine, Ser: 0.81 mg/dL (ref 0.57–1.00)
GFR calc Af Amer: 88 mL/min/{1.73_m2} (ref >59)
GFR calc non Af Amer: 76 mL/min/{1.73_m2} (ref >59)
Glucose: 98 mg/dL (ref 65–99)
Potassium: 4.1 mmol/L (ref 3.5–5.2)
Sodium: 141 mmol/L (ref 134–144)

## 2020-03-19 LAB — LIPID PANEL
Chol/HDL Ratio: 1.9 ratio (ref 0.0–4.4)
Cholesterol, Total: 101 mg/dL (ref 100–199)
HDL: 53 mg/dL (ref >39)
LDL Chol Calc (NIH): 33 mg/dL (ref 0–99)
Triglycerides: 69 mg/dL (ref 0–149)
VLDL Cholesterol Cal: 15 mg/dL (ref 5–40)

## 2020-03-22 ENCOUNTER — Encounter: Payer: Self-pay | Admitting: Family Medicine

## 2020-03-22 DIAGNOSIS — L299 Pruritus, unspecified: Secondary | ICD-10-CM | POA: Insufficient documentation

## 2020-03-22 NOTE — Assessment & Plan Note (Signed)
Patient with ongoing hair loss.  Previously used Rogaine.  Etiology likely due to age.  Most recent CBC without evidence of anemia.  No known trauma.  No evidence of fungal infection seen on scalp.  Appears this is been an ongoing problem since 2015. -Dermatology referral placed, per patient request

## 2020-03-22 NOTE — Assessment & Plan Note (Signed)
Uncontrolled.  No lesions / rash on exam.  LFTs in July were unremarkable.  Etiology unclear. DDX: Drug-induced, insect related, psych induced / formication, RUQ pathology.  Contact dermatitis, eczema, dry skin -Start Pepcid -Gabapentin 100 mg 3 times daily -Kenalog 0.1% cream -Referral placed for dermatology

## 2020-03-22 NOTE — Assessment & Plan Note (Signed)
Uncontrolled  - Obtain BMP to check electrolytes  - Restart Gabapentin 100 mg TID.

## 2020-04-07 ENCOUNTER — Telehealth: Payer: Self-pay

## 2020-04-07 DIAGNOSIS — R2681 Unsteadiness on feet: Secondary | ICD-10-CM

## 2020-04-07 DIAGNOSIS — M5417 Radiculopathy, lumbosacral region: Secondary | ICD-10-CM

## 2020-04-07 DIAGNOSIS — M48062 Spinal stenosis, lumbar region with neurogenic claudication: Secondary | ICD-10-CM

## 2020-04-07 DIAGNOSIS — M545 Low back pain, unspecified: Secondary | ICD-10-CM

## 2020-04-07 NOTE — Telephone Encounter (Signed)
Patient calls nurse line requesting DME order for folding cane. If appropriate, please place order and let "RN Team" know when order is complete for processing.   To PCP  Veronda Prude, RN

## 2020-04-08 NOTE — Telephone Encounter (Signed)
Received following message from [employer]  received, thanks    Veronda Prude, RN

## 2020-04-08 NOTE — Telephone Encounter (Signed)
Community message sent to [employer]. Will await response.   Valton Schwartz C Mishayla Sliwinski, RN  

## 2020-04-18 ENCOUNTER — Emergency Department (HOSPITAL_COMMUNITY)
Admission: EM | Admit: 2020-04-18 | Discharge: 2020-04-18 | Disposition: A | Discharge status: Home / Self Care | Payer: Medicare Other | Attending: Emergency Medicine | Admitting: Emergency Medicine

## 2020-04-18 ENCOUNTER — Other Ambulatory Visit: Payer: Self-pay

## 2020-04-18 ENCOUNTER — Ambulatory Visit (HOSPITAL_BASED_OUTPATIENT_CLINIC_OR_DEPARTMENT_OTHER): Payer: Medicare Other

## 2020-04-18 ENCOUNTER — Encounter (HOSPITAL_COMMUNITY): Payer: Self-pay | Admitting: *Deleted

## 2020-04-18 DIAGNOSIS — I10 Essential (primary) hypertension: Secondary | ICD-10-CM | POA: Insufficient documentation

## 2020-04-18 DIAGNOSIS — M79605 Pain in left leg: Secondary | ICD-10-CM | POA: Insufficient documentation

## 2020-04-18 DIAGNOSIS — R52 Pain, unspecified: Secondary | ICD-10-CM

## 2020-04-18 DIAGNOSIS — Z7982 Long term (current) use of aspirin: Secondary | ICD-10-CM | POA: Diagnosis not present

## 2020-04-18 DIAGNOSIS — Z79899 Other long term (current) drug therapy: Secondary | ICD-10-CM | POA: Diagnosis not present

## 2020-04-18 DIAGNOSIS — M79604 Pain in right leg: Secondary | ICD-10-CM | POA: Diagnosis not present

## 2020-04-18 DIAGNOSIS — M79661 Pain in right lower leg: Secondary | ICD-10-CM | POA: Diagnosis present

## 2020-04-18 NOTE — ED Provider Notes (Signed)
[facility] EMERGENCY DEPARTMENT Provider Note   CSN: [id number] Arrival date & time: 04/18/20  1319     History Chief Complaint  Patient presents with  . Leg Pain    Megan Miller is a 64 y.o. female.  HPI   Patient significant medical history of chronic back pain, GERD, hypertension, leg edema, stroke presents emergency department chief with complaint of bilateral calf and back knee pain x2 weeks.  Patient states she has had this pain since the beginning of this month, she describes the pain as a sharp burning pain which she feels up and down her legs.  She admits that the pain is constant and exertion does not make it worse.  She denies seeing increased swelling, has no history of DVTs or PEs, not on hormone therapy, does not smoke.  She denies any alleviating factors at this time.  Patient denies headache, fever, chills, shortness of breath, chest pain, abdominal pain, nausea, vomiting, diarrhea, worsening pedal edema.   Past Medical History:  Diagnosis Date  . Allergic conjunctivitis   . Chest pain   . Chronic back pain   . Constipation, chronic   . Dyspnea on exertion   . Dysthymic   . GERD (gastroesophageal reflux disease)   . Hypertension   . Leg edema    Asymetric Right leg (dopplers negative)  . Overweight (BMI 25.0-29.9)   . Stroke (HCC)   . Urination frequency     Patient Active Problem List   Diagnosis Date Noted  . Itching 03/22/2020  . Leg cramping 01/29/2020  . 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 Primary osteoarthritis of right knee 07/09/2018  . Gait instability 01/08/2018  . Chronic migraine without aura without status migrainosus, not intractable 11/13/2017  . Lumbosacral radiculopathy 11/12/2017  . Cerebrovascular accident (CVA) (HCC) 11/08/2017  . Spinal stenosis of lumbar region 11/08/2017  . Routine adult health maintenance 02/13/2016  . Syncope 11/15/2015  . Edema of right foot 01/27/2015  . Intertrigo 12/30/2014  .  Stable angina (HCC) 09/23/2014  . Hot flashes 11/27/2013  . Hair loss 09/10/2013  . Depression 03/18/2013  . Dysuria 06/24/2012  . 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 OVERWEIGHT 05/05/2009  . CONSTIPATION, CHRONIC 02/10/2009  . DYSTHYMIC DISORDER 02/06/2008  . GERD 02/06/2008  . HYPERTENSION, BENIGN 10/09/2006  . Chronic low back pain 10/09/2006    Past Surgical History:  Procedure Laterality Date  . 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 BILATERAL SALPINGOOPHORECTOMY  05/08/2001   Path=extensive endometriosis  . BREAST SURGERY  12/29/2003   Right breast core biopsy=benign fibrocystic change  . SMALL INTESTINE SURGERY  03/30/2001   Rectosigmoid bowel resection and reanastomosis     OB History   No obstetric history on file.     Family History  Problem Relation Age of Onset  . Hypertension Mother   . Diabetes Father   . Diabetes Sister   . Asthma Brother   . Diabetes Brother   . Kidney disease Brother   . Colon cancer Neg Hx   . Colon polyps Neg Hx   . Esophageal cancer Neg Hx   . Breast cancer Neg Hx   . Pancreatic cancer Neg Hx   . Stomach cancer Neg Hx     Social History   Tobacco Use  . Smoking status: Never Smoker  . Smokeless tobacco: Never Used  Vaping Use  . Vaping Use: Never used  Substance Use Topics  . Alcohol use: No    Alcohol/week:  0.0 standard drinks  . Drug use: No    Home Medications Prior to Admission medications   Medication Sig Start Date End Date Taking? Authorizing Provider  acetaminophen (TYLENOL 8 HOUR) 650 MG CR tablet Take 1 tablet (650 mg total) by mouth every 6 (six) hours as needed for pain. 12/16/18   Mirian Mo, MD  ACIPHEX 20 MG tablet Take 1 tablet (20 mg total) by mouth daily. Brand Name Medically Neccessary. 12/24/19   Esterwood, Amy S, PA-C  aspirin 81 MG tablet Take 1 tablet (81 mg total) by mouth daily. 09/23/14   Glori Luis, MD  atorvastatin (LIPITOR) 40 MG tablet Take 1 tablet (40 mg total) by mouth daily.  11/14/18   Mirian Mo, MD  carvedilol (COREG) 3.125 MG tablet Take 1 tablet (3.125 mg total) by mouth 2 (two) times daily with a meal. 04/22/19 10/19/19  Brimage, Seward Meth, DO  desonide (DESOWEN) 0.05 % cream Apply topically 2 (two) times daily. Patient taking differently: Apply 1 application topically daily. For itching 04/08/13   Hairford, Ricki Miller, MD  diclofenac sodium (VOLTAREN) 1 % GEL Apply 2 g topically 4 (four) times daily. 08/06/18   Mirian Mo, MD  famotidine (PEPCID) 20 MG tablet Take 1 tablet (20 mg total) by mouth 2 (two) times daily. 03/18/20 07/16/20  [name], DO  gabapentin (NEURONTIN) 100 MG capsule Take 1 capsule (100 mg total) by mouth 3 (three) times daily as needed. 03/18/20   [name], DO  ibuprofen (ADVIL) 400 MG tablet Take 1 tablet (400 mg total) by mouth every 6 (six) hours as needed. 12/02/18   Mirian Mo, MD  ipratropium (ATROVENT) 0.06 % nasal spray Place 2 sprays into both nostrils 4 (four) times daily. Patient taking differently: Place 2 sprays into both nostrils 4 (four) times daily as needed (seasonal allergies).  09/26/16 03/18/18  Velora Heckler A, MD  nitroGLYCERIN (NITRODUR - DOSED IN MG/24 HR) 0.2 mg/hr patch APPLY 1/4 PATCH TO AFFECTED AREA. CHANGE PATCH EVERY 24 HOURS.    Hairford, Ricki Miller, MD  olopatadine (PATANOL) 0.1 % ophthalmic solution Place 1 drop into both eyes 2 (two) times daily as needed (seasonal allergies/itching).     [provider]  ondansetron (ZOFRAN ODT) 4 MG disintegrating tablet Take 1 tablet (4 mg total) by mouth every 8 (eight) hours as needed for nausea or vomiting. 09/23/19   Antony Madura, PA-C  polyethylene glycol powder (GLYCOLAX/MIRALAX) 17 GM/SCOOP powder Dissolve 17grams in at at least 8 ounces of water or juice daily as needed. 11/11/19   Esterwood, Amy S, PA-C  traMADol (ULTRAM) 50 MG tablet Take 1 tablet (50 mg total) by mouth every 6 (six) hours as needed. 01/02/19   Arthor Captain, PA-C  triamcinolone cream  (KENALOG) 0.1 % Apply 1 application topically 2 (two) times daily. 03/18/20   [name], DO    Allergies    Percocet [oxycodone-acetaminophen] and Codeine phosphate  Review of Systems   Review of Systems  Constitutional: Negative for chills and fever.  HENT: Negative for congestion.   Respiratory: Negative for cough and shortness of breath.   Cardiovascular: Negative for chest pain and leg swelling.  Gastrointestinal: Negative for abdominal pain, diarrhea, nausea and vomiting.  Genitourinary: Negative for enuresis, flank pain and frequency.  Musculoskeletal: Negative for back pain.       Patient admits to bilateral calf pain and back of the knee pain.  Skin: Negative for rash.  Neurological: Negative for dizziness, facial asymmetry and headaches.  Hematological:  Does not bruise/bleed easily.    Physical Exam Updated Vital Signs BP (!) 165/82 (BP Location: Right Arm)   Pulse 86   Temp 98.8 F (37.1 C)   Resp 16   Ht 5\' 1"  (1.549 m)   Wt 83 kg   SpO2 99%   BMI 34.58 kg/m   Physical Exam Vitals and nursing note reviewed.  Constitutional:      General: She is not in acute distress.    Appearance: She is not ill-appearing.  HENT:     Head: Normocephalic and atraumatic.     Nose: No congestion.     Mouth/Throat:     Mouth: Mucous membranes are moist.     Pharynx: Oropharynx is clear.  Eyes:     General: No scleral icterus. Cardiovascular:     Rate and Rhythm: Normal rate and regular rhythm.     Pulses: Normal pulses.     Heart sounds: No murmur heard.  No friction rub. No gallop.   Pulmonary:     Effort: No respiratory distress.     Breath sounds: No wheezing, rhonchi or rales.  Abdominal:     General: There is no distension.     Palpations: Abdomen is soft.     Tenderness: There is no abdominal tenderness. There is no right CVA tenderness, left CVA tenderness or guarding.  Musculoskeletal:        General: No swelling.     Right lower leg: No edema.      Left lower leg: No edema.     Comments: Patient's legs were visualized, they were none erythematous, no edema noted, slight tender to palpation along the upper posterior aspect of the calves bilaterally.  She had full range of motion and equal strength at the knees and ankles, neurovascular fully intact.  Skin:    General: Skin is warm and dry.     Findings: No rash.  Neurological:     Mental Status: She is alert.  Psychiatric:        Mood and Affect: Mood normal.     ED Results / Procedures / Treatments   Labs (all labs ordered are listed, but only abnormal results are displayed) Labs Reviewed - No data to display  EKG None  Radiology VAS LOWER EXTREMITY VENOUS (DVT) (ONLY [facility] & [facility])  Result Date: 04/18/2020  Lower Venous DVTStudy Indications: Pain.  Comparison Study: 03/18/18 previous Performing Technologist: 03/20/18 RVS  Examination Guidelines: A complete evaluation includes B-mode imaging, spectral Doppler, color Doppler, and power Doppler as needed of all accessible portions of each vessel. Bilateral testing is considered an integral part of a complete examination. Limited examinations for reoccurring indications may be performed as noted. The reflux portion of the exam is performed with the patient in reverse Trendelenburg.  +---------+---------------+---------+-----------+----------+--------------+ RIGHT    CompressibilityPhasicitySpontaneityPropertiesThrombus Aging +---------+---------------+---------+-----------+----------+--------------+ CFV      Full           Yes      Yes                                 +---------+---------------+---------+-----------+----------+--------------+ SFJ      Full                                                        +---------+---------------+---------+-----------+----------+--------------+  FV Prox  Full                                                         +---------+---------------+---------+-----------+----------+--------------+ FV Mid   Full                                                        +---------+---------------+---------+-----------+----------+--------------+ FV DistalFull                                                        +---------+---------------+---------+-----------+----------+--------------+ PFV      Full                                                        +---------+---------------+---------+-----------+----------+--------------+ POP      Full           Yes      Yes                                 +---------+---------------+---------+-----------+----------+--------------+ PTV      Full                                                        +---------+---------------+---------+-----------+----------+--------------+ PERO     Full                                                        +---------+---------------+---------+-----------+----------+--------------+   +---------+---------------+---------+-----------+----------+--------------+ LEFT     CompressibilityPhasicitySpontaneityPropertiesThrombus Aging +---------+---------------+---------+-----------+----------+--------------+ CFV      Full           Yes      Yes                                 +---------+---------------+---------+-----------+----------+--------------+ SFJ      Full                                                        +---------+---------------+---------+-----------+----------+--------------+ FV Prox  Full                                                        +---------+---------------+---------+-----------+----------+--------------+  FV Mid   Full                                                        +---------+---------------+---------+-----------+----------+--------------+ FV DistalFull                                                         +---------+---------------+---------+-----------+----------+--------------+ PFV      Full                                                        +---------+---------------+---------+-----------+----------+--------------+ POP      Full           Yes      Yes                                 +---------+---------------+---------+-----------+----------+--------------+ PTV      Full                                                        +---------+---------------+---------+-----------+----------+--------------+ PERO     Full                                                        +---------+---------------+---------+-----------+----------+--------------+     Summary: BILATERAL: - No evidence of deep vein thrombosis seen in the lower extremities, bilaterally. - No evidence of superficial venous thrombosis in the lower extremities, bilaterally. -   *See table(s) above for measurements and observations.    Preliminary     Procedures Procedures (including critical care time)  Medications Ordered in ED Medications - No data to display  ED Course  I have reviewed the triage vital signs and the nursing notes.  Pertinent labs & imaging results that were available during my care of the patient were reviewed by me and considered in my medical decision making (see chart for details).    MDM Rules/Calculators/A&P                          I have personally reviewed all imaging, labs and have interpreted them.  Patient presents to the emergency department with chief complaint of bilateral calf pain x2 weeks.  On exam she was alert and oriented, did not appear in acute distress, vital signs reassuring.  Lung sounds were clear bilaterally, abdomen was soft nontender to palpation, she had tenderness upon palpation bilaterally on her calves, no pedal edema, neurovascular fully intact.  Will order DVT study for further evaluation.  DVT study came back unremarkable.  I have low suspicion  patient suffering from  a PE as she denies pleuritic chest pain, shortness of breath, no pedal edema noted on exam, no history of DVTs or PEs, not on hormone therapy, no recent surgeries or prolonged immobilization.  Low suspicion for DVT as DVT study came back unremarkable.  Low suspicion for septic joint as patient denies IV drug use, fevers, chills, rashes,  on exam no erythema or edema noted.  Low suspicion for fracture or dislocation as patient denies recent trauma to the area and there were no abnormalities palpated on exam.  Low suspicion for PAD as neurovascular is fully intact, no ulcers or other gross numbers noted on skin exam.  I suspect patient's pain is more acute on chronic muscle strains. After further review of patient's record, she has been evaluated for this same complaint multiple times without finding any abnormalities.  Will recommend patient uses over-the-counter pain medications, provide exercises for muscle stretching and have her follow-up with her primary care provider for further management.  Patient appears resting calmly, vital signs reassuring, no indication for hospital admission.  Patient was given at home care she will strict return precautions.  Patient verbalized that she understood and agreed to plan.   Final Clinical Impression(s) / ED Diagnoses Final diagnoses:  Bilateral leg pain    Rx / DC Orders ED Discharge Orders    None       Carroll Sage, PA-C 04/18/20 1645    Little, Ambrose Finland, MD 04/18/20 2037

## 2020-04-18 NOTE — Discharge Instructions (Addendum)
You have been seen here for bilateral leg cramps.  Lab work and imaging all look reassuring.  I recommend taking over-the-counter pain medication like ibuprofen and or Tylenol every 6 as needed for pain.  I also recommend staying hydrated as this can help decrease cramps and pain.  I would recommend stretching your calves as this can also help relieve some pain as well.  I want you to follow-up with your primary care provider for further evaluation management.  If you do not have 1 please contact community health and wellness they can provide with a primary care provider.  I want to come back to emergency department if develop chest pain, shortness of breath, severe abdominal pain, nausea, vomiting, diarrhea, pedal edema systems as these symtoms require further evaluation management.

## 2020-04-18 NOTE — ED Triage Notes (Signed)
Pt reports bilateral knee pain. Denies injury. Ambulatory at triage with a cane.

## 2020-04-18 NOTE — Progress Notes (Signed)
Lower extremity venous has been completed.   Preliminary results in CV Proc.   Blanch Media 04/18/2020 4:32 PM

## 2020-04-21 ENCOUNTER — Other Ambulatory Visit: Payer: Self-pay

## 2020-04-21 ENCOUNTER — Ambulatory Visit (INDEPENDENT_AMBULATORY_CARE_PROVIDER_SITE_OTHER): Payer: Medicare Other | Admitting: Family Medicine

## 2020-04-21 VITALS — BP 135/80 | HR 90 | Ht 61.0 in | Wt 186.8 lb

## 2020-04-21 DIAGNOSIS — R350 Frequency of micturition: Secondary | ICD-10-CM

## 2020-04-21 DIAGNOSIS — L299 Pruritus, unspecified: Secondary | ICD-10-CM

## 2020-04-21 LAB — POCT URINALYSIS DIP (MANUAL ENTRY)
Bilirubin, UA: NEGATIVE
Blood, UA: NEGATIVE
Glucose, UA: NEGATIVE mg/dL
Ketones, POC UA: NEGATIVE mg/dL
Leukocytes, UA: NEGATIVE
Nitrite, UA: NEGATIVE
Protein Ur, POC: NEGATIVE mg/dL
Spec Grav, UA: 1.025 (ref 1.010–1.025)
Urobilinogen, UA: 1 E.U./dL
pH, UA: 6 (ref 5.0–8.0)

## 2020-04-21 MED ORDER — HYDROXYZINE HCL 10 MG PO TABS: 10.0000 mg | ORAL_TABLET | Freq: Three times a day (TID) | ORAL | 0 refills | Status: DC | PRN

## 2020-04-21 MED ORDER — CETIRIZINE HCL 10 MG PO TABS: 10.0000 mg | ORAL_TABLET | Freq: Every day | ORAL | 3 refills | Status: DC

## 2020-04-21 NOTE — Patient Instructions (Signed)
Thank you for coming to see me today. It was a pleasure. Today we talked about:   Your itching.  Stop taking gabapentin.  Continue to take pepcid daily.  We will also add Zyrtec and you should take it daily.  Use the triamcinolone (or kenalog cream) that you were given and mix it with cream like what is pictured below (Eucerin cream for Eczema).  Use this daily.  Your urine did no show signs of infection.  Please follow-up with PCP as needed.  If you have any questions or concerns, please do not hesitate to call the office at [phone].  Best,   Luis Abed, DO

## 2020-04-21 NOTE — Progress Notes (Signed)
SUBJECTIVE:   CHIEF COMPLAINT / HPI:   Itching Was seen by PCP on 8/20 for itching in various locations without overlying rash Prescribed pepcid, gabapentin 100mg  TID, Kenalog 1% cream States that she has been taking the medication and and using the cream without any improvement Using kenalog once a day, calms it down for about a few hours, then itching comes back Overall has been occurring for a year Head, back, arms, under breasts Legs don't itch Comes and goes Nothing makes it better, goes away on its own Nothing makes it worse Sometimes she will notices scabs, but thinks that it is from her scratching No dry peeling skin, no redness No new detergents in the last year No changes in soap, medications Uses lotion sometimes, doesn't think that it is scented Hasn't noticed if lotion makes it worse or better Itching is worse during the day, sometimes can sleep, but thinks when she can't that it isn't from itching Had something similar happen two years Itching happens every day, but it comes and goes throughout the day Sometimes has fatigue No RUQ pain No new focal weakness, has persistent left weakness in leg No dizziness associated with the itching No night sweats, weight loss   DYSURIA Pain urinating started 2-3 weeks ago. Pain is: discomfort Medications tried: none Any antibiotics in the last 30 days: none More than 3 UTIs in the last 12 months: no STD exposure: none Possibly pregnant: no, LMP in 30s Symptoms Urgency: yes Frequency: yes Blood in urine: no Pain in back:no Fever: no Vaginal discharge: no Mouth Ulcers: no     PERTINENT  PMH / PSH: Hx CVA, HTN, Stable Angina, GERD, Depression  OBJECTIVE:   BP 135/80   Pulse 90   Ht 5\' 1"  (1.549 m)   Wt 186 lb 12.8 oz (84.7 kg)   SpO2 97%   BMI 35.30 kg/m    Physical Exam:  General: 64 y.o. female in NAD HEENT: NCAT Lungs: Breathing comfortably on room air Skin: warm and dry, well moisturized,  multiple linear excoriations on back, see image below, no overlying erythema or rash noted, no rash under breasts, no rash noted on arms Abd: No CVA tenderness Extremities: No edema      ASSESSMENT/PLAN:   Generalized pruritus Most likely cause is secondary to allergies.  Patient does not have any overlying rashes.  Previously has had normal renal function and liver panel in July.  CBCs have also been within normal limits, no evidence of polycythemia vera, IDA, elevated white blood cell count.  TSH has been within normal limits, most recently checked in 2019.  Hemoglobin A1c's have also previously been normal.  She has a prior negative hepatitis C and HIV screening.  Given that she has not had improvement with gabapentin and that she is at age 38, will go ahead and discontinue.  We will continue with Pepcid, add on Zyrtec for additional antihistamine benefit.  We will also instruct patient to mix triamcinolone cream with Eucerin eczema cream and use daily.  Hydroxyzine prescribed as needed with 30 tablets, instructed to only use when itching is not tolerable.  Would limit her supply of hydroxyzine given age of 44.  If no improvement, patient to follow-up with PCP as needed.  Urine frequency UA negative for acute infection.  No signs of systemic disease, without fever and CVA pain or tenderness.  Follow-up with PCP if no improvement.     75, DO [facility]

## 2020-04-21 NOTE — Assessment & Plan Note (Signed)
UA negative for acute infection.  No signs of systemic disease, without fever and CVA pain or tenderness.  Follow-up with PCP if no improvement.

## 2020-04-21 NOTE — Assessment & Plan Note (Addendum)
Most likely cause is secondary to allergies.  Patient does not have any overlying rashes.  Previously has had normal renal function and liver panel in July.  CBCs have also been within normal limits, no evidence of polycythemia vera, IDA, elevated white blood cell count.  TSH has been within normal limits, most recently checked in 2019.  Hemoglobin A1c's have also previously been normal.  She has a prior negative hepatitis C and HIV screening.  Given that she has not had improvement with gabapentin and that she is at age 9, will go ahead and discontinue.  We will continue with Pepcid, add on Zyrtec for additional antihistamine benefit.  We will also instruct patient to mix triamcinolone cream with Eucerin eczema cream and use daily.  Hydroxyzine prescribed as needed with 30 tablets, instructed to only use when itching is not tolerable.  Would limit her supply of hydroxyzine given age of 64.  If no improvement, patient to follow-up with PCP as needed.

## 2020-04-25 NOTE — Telephone Encounter (Signed)
Patient returns call to nurse line to follow up on status of order. Messaged [employer] to follow up. Will await response.    Veronda Prude, RN

## 2020-05-08 ENCOUNTER — Other Ambulatory Visit: Payer: Self-pay | Admitting: Family Medicine

## 2020-05-19 ENCOUNTER — Ambulatory Visit (INDEPENDENT_AMBULATORY_CARE_PROVIDER_SITE_OTHER): Payer: Medicare Other | Admitting: Cardiology

## 2020-05-19 ENCOUNTER — Encounter: Payer: Self-pay | Admitting: Cardiology

## 2020-05-19 ENCOUNTER — Other Ambulatory Visit: Payer: Self-pay

## 2020-05-19 VITALS — BP 120/80 | HR 86 | Ht 61.0 in | Wt 187.0 lb

## 2020-05-19 DIAGNOSIS — I10 Essential (primary) hypertension: Secondary | ICD-10-CM | POA: Diagnosis not present

## 2020-05-19 DIAGNOSIS — M79605 Pain in left leg: Secondary | ICD-10-CM
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
## 2020-05-19 NOTE — Patient Instructions (Signed)
Medication Instructions:  ?The current medical regimen is effective;  continue present plan and medications. ? ?*If you need a refill on your cardiac medications before your next appointment, please call your pharmacy* ? ?Follow-Up: ?At [facility], you and your health needs are our priority.  As part of our continuing mission to provide you with exceptional heart care, we have created designated Provider Care Teams.  These Care Teams include your primary Cardiologist (physician) and Advanced Practice Providers (APPs -  Physician Assistants and Nurse Practitioners) who all work together to provide you with the care you need, when you need it. ? ?We recommend signing up for the patient portal called "MyChart".  Sign up information is provided on this After Visit Summary.  MyChart is used to connect with patients for Virtual Visits (Telemedicine).  Patients are able to view lab/test results, encounter notes, upcoming appointments, etc.  Non-urgent messages can be sent to your provider as well.   ?To learn more about what you can do with MyChart, go to [email or web address].   ? ?Your next appointment:   ?Follow up as needed with Dr Skains ? ?Thank you for choosing [facility]!! ? ? ? ?

## 2020-05-23 ENCOUNTER — Telehealth: Payer: Self-pay | Admitting: Physician Assistant

## 2020-05-23 ENCOUNTER — Other Ambulatory Visit: Payer: Self-pay

## 2020-05-23 DIAGNOSIS — K219 Gastro-esophageal reflux disease without esophagitis: Secondary | ICD-10-CM

## 2020-05-23 MED ORDER — ACIPHEX 20 MG PO TBEC: 20.0000 mg | DELAYED_RELEASE_TABLET | Freq: Every day | ORAL | 10 refills | Status: DC

## 2020-05-23 NOTE — Telephone Encounter (Signed)
Refill transmitted

## 2020-05-23 NOTE — Telephone Encounter (Signed)
Pt still not able to pick up medication because it has not been called in.

## 2020-05-25 ENCOUNTER — Other Ambulatory Visit: Payer: Self-pay

## 2020-05-25 ENCOUNTER — Telehealth: Payer: Self-pay | Admitting: Physician Assistant

## 2020-05-25 DIAGNOSIS — K219 Gastro-esophageal reflux disease without esophagitis: Secondary | ICD-10-CM

## 2020-05-25 MED ORDER — RABEPRAZOLE SODIUM 20 MG PO TBEC: 20.0000 mg | DELAYED_RELEASE_TABLET | Freq: Every day | ORAL | 10 refills | Status: DC

## 2020-05-25 NOTE — Telephone Encounter (Addendum)
Megan Miller, I re-sent the rx to pharmacy because I did not see were PA had been faxed to our office. I tried returning pt's call but had leave a voicemail. Not sure why they sent this to me, but I attempted to take care of it. Do you know anything about this?

## 2020-05-25 NOTE — Telephone Encounter (Signed)
Pt called stating that her pharmacy told her that rf for Aciphex needs her doctor's approval. Pt was not sure of what this means. Maybe her insurance does not cover medication? She is requesting if we can call her pharmacy to find out.

## 2020-05-26 NOTE — Telephone Encounter (Signed)
Prior Authorization has been faxed to [facility]

## 2020-05-26 NOTE — Telephone Encounter (Signed)
Pt called again stating that her pharmacy told her that we need to contact her insurance about this prescription. Pt would like a call back with the status.

## 2020-05-30 ENCOUNTER — Other Ambulatory Visit: Payer: Self-pay | Admitting: Family Medicine

## 2020-06-01 ENCOUNTER — Telehealth: Payer: Self-pay | Admitting: Internal Medicine

## 2020-06-01 NOTE — Telephone Encounter (Signed)
Contacted Medicaid this morning they stated that they denied the patient's medication due to her having a part D plan. After contacting the patient's pharmacy I got the information for her part D plan and started a new prior authorization. She has since been approved.  Status:Approved Review Type:Prior Auth Coverage Start Date:05/02/2020; Coverage End Date:06/01/2021  A voicemail has been left for the patient, to notify her of approval status.

## 2020-06-01 NOTE — Telephone Encounter (Signed)
Pt calling back to check on status of med. Looks like you have been working on this.

## 2020-06-01 NOTE — Telephone Encounter (Signed)
See previous phone note.  

## 2020-06-21 ENCOUNTER — Ambulatory Visit (INDEPENDENT_AMBULATORY_CARE_PROVIDER_SITE_OTHER): Payer: Medicare Other | Admitting: Family Medicine

## 2020-06-21 ENCOUNTER — Other Ambulatory Visit: Payer: Self-pay

## 2020-06-21 DIAGNOSIS — K219 Gastro-esophageal reflux disease without esophagitis: Secondary | ICD-10-CM

## 2020-06-21 NOTE — Progress Notes (Signed)
    SUBJECTIVE:   CHIEF COMPLAINT / HPI: sore throat  64 yo woman presents today with 2 weeks of sore throat. She denies other symptoms such as cough, fever, runny nose, nasal congestion, SOB, CP, n/v/d. She has not lost sense of taste or smell, no funny or sour tastes. No sick contacts. She is fully vaccinated for COVID-19. Patient does have GERD and recently restarted aciphex in the last few days, as she had trouble getting the medication due to insurance. She reports that she eats her last meal at 11 PM-12 AM. She has not noticed any specific trigger foods, does not avoid tomato/citrus/chocolate/etoh.   PERTINENT  PMH / PSH: GERD  OBJECTIVE:  Nursing note and vitals reviewed BP 138/70   Pulse 80   Temp 98.3 F (36.8 C) (Oral)   Wt 196 lb (88.9 kg) Comment: Patient reported  SpO2 98%   BMI 37.03 kg/m   HEENT: PERRL. Sclera without injection or icterus. MMM. Oropharynx clear, normal mucosal surfaces.  Cardiac: Regular rate and rhythm. Normal S1/S2. No murmurs, rubs, or gallops appreciated. Lungs: Clear bilaterally to ascultation.  Neuro: AOx3, at baseline Psych: Pleasant and appropriate  ASSESSMENT/PLAN:   GERD Patient with known GERD, has not yet instituted lifestyle changes to reduce symptoms, recently started PPI again. In the absence of physical exam findings and ROS negative for other symptoms of infectious origin, suspect most likely etiology for sore throat is GERD. Recommend patient have her last meal prior to 7 PM, reduce evening snack as much as possible. Eliminate/reduce/avoid trigger foods such as tomato/citrus/chocolate/etoh. Add wedge to mattress/pillow to elevate head, continue PPI. If no improvement with this regimen in 1-2 weeks, recommend follow up with PCP. RTC precautions given.     Shirlean Mylar, MD [facility]

## 2020-06-21 NOTE — Patient Instructions (Addendum)
It was a pleasure to see you today!  Your sore throat could be due to several things, you may have a little upper respiratory virus that is contributing, but it is more likely that this is a symptom of your acid reflux. I recommend eating food much earlier (ideally before 7 PM, but changing to before 9 PM would help, too). You can consider placing a wedge pillow under your mattress to raise the head of your bed, or sleeping with more than one pillow. Keep taking the aciphex and I recommend staying away from trigger foods like tomato-based sauces, citrus, alcohol, etc. If your throat does not improve with these changes, please return for a follow up.   Be Well,  Dr. Leary Roca

## 2020-06-26 NOTE — Assessment & Plan Note (Signed)
Patient with known GERD, has not yet instituted lifestyle changes to reduce symptoms, recently started PPI again. In the absence of physical exam findings and ROS negative for other symptoms of infectious origin, suspect most likely etiology for sore throat is GERD. Recommend patient have her last meal prior to 7 PM, reduce evening snack as much as possible. Eliminate/reduce/avoid trigger foods such as tomato/citrus/chocolate/etoh. Add wedge to mattress/pillow to elevate head, continue PPI. If no improvement with this regimen in 1-2 weeks, recommend follow up with PCP. RTC precautions given.

## 2020-06-30 ENCOUNTER — Other Ambulatory Visit: Payer: Self-pay | Admitting: Family Medicine

## 2020-06-30 DIAGNOSIS — Z1231 Encounter for screening mammogram for malignant neoplasm of breast: Secondary | ICD-10-CM

## 2020-07-18 ENCOUNTER — Ambulatory Visit
Admission: RE | Admit: 2020-07-18 | Discharge: 2020-07-18 | Disposition: A | Discharge status: Home / Self Care | Payer: Medicare Other | Source: Ambulatory Visit | Attending: Family Medicine | Admitting: Family Medicine

## 2020-07-18 ENCOUNTER — Other Ambulatory Visit: Payer: Self-pay

## 2020-07-18 DIAGNOSIS — Z1231 Encounter for screening mammogram for malignant neoplasm of breast: Secondary | ICD-10-CM

## 2020-08-23 ENCOUNTER — Ambulatory Visit (INDEPENDENT_AMBULATORY_CARE_PROVIDER_SITE_OTHER): Payer: Medicare Other

## 2020-08-23 ENCOUNTER — Other Ambulatory Visit: Payer: Self-pay

## 2020-08-23 DIAGNOSIS — Z23 Encounter for immunization: Secondary | ICD-10-CM

## 2020-08-23 NOTE — Progress Notes (Signed)
   Covid-19 Vaccination Clinic  Name:  Megan Miller    MRN: [id number] DOB: Apr 29, 1956  08/23/2020   Patient presents to nurse clinic for COVID booster. Administered in RD, site unremarkable, tolerated injection well.   Ms. Wilensky was observed post Covid-19 immunization for 15 minutes without incident. She was provided with Vaccine Information Sheet and instruction to access the V-Safe system.   Ms. Gafford was instructed to call 911 with any severe reactions post vaccine: Marland Kitchen Difficulty breathing  . Swelling of face and throat  . A fast heartbeat  . A bad rash all over body  . Dizziness and weakness  Provided patient with updated immunization card and record.   Veronda Prude, RN

## 2020-08-26 ENCOUNTER — Encounter: Payer: Self-pay | Admitting: Family Medicine

## 2020-08-26 ENCOUNTER — Other Ambulatory Visit: Payer: Self-pay

## 2020-08-26 ENCOUNTER — Ambulatory Visit (INDEPENDENT_AMBULATORY_CARE_PROVIDER_SITE_OTHER): Payer: Medicare Other | Admitting: Family Medicine

## 2020-08-26 VITALS — BP 112/80 | HR 74 | Wt 189.8 lb

## 2020-08-26 DIAGNOSIS — K219 Gastro-esophageal reflux disease without esophagitis: Secondary | ICD-10-CM

## 2020-08-26 MED ORDER — FAMOTIDINE 20 MG PO TABS: 20.0000 mg | ORAL_TABLET | Freq: Every day | ORAL | 0 refills | Status: DC

## 2020-08-26 NOTE — Progress Notes (Signed)
    SUBJECTIVE:   CHIEF COMPLAINT / HPI:   "Feels like something is stuck in throat" Patient reports globus sensation in her throat. It is located in her upper substernal area, not her anterior neck. This has been present for many months, but it comes and goes. It seems to be worse over the past 1 week. She has a long hx of GERD and takes aciphex 20mg  daily, but feels lately it has not provided as much relief. She tries to avoid spicy foods, acidic foods, alcohol.  Admits she often eats at night before going to bed.  No difficulty swallowing food or liquid.  Of note, patient was evaluated by GI in April 2021. Barium swallow was performed at that time to rule out esophageal ring or peptic stricture.   PERTINENT  PMH / PSH: GERD, HTN, HLD  OBJECTIVE:   BP 112/80   Pulse 74   Wt 189 lb 12.8 oz (86.1 kg)   SpO2 98%   BMI 35.86 kg/m   Gen: Alert, well-appearing, NAD HEENT: Moist mucous membranes, oropharynx clear, normal elevation of palate CV: RRR, normal S1/S2 Resp: Speaking in full sentences, breathing comfortably, no respiratory distress   ASSESSMENT/PLAN:   GERD Patient's symptoms of globus sensation consistent with her prior GERD. Barium swallow eval on 11/25/2019 was normal. Last EGD in June 2016 was normal. Reassured by the chronicity of her symptoms and that there is no difficulty swallowing solid foods or liquids. -We will trial H2 blocker (Pepcid 20 mg daily) x2 weeks -Recommend follow-up with GI -Continue Aciphex -Counseled on staying upright for at least 1hr after eating and avoiding spicy/acidic foods -Discussed return precautions  Case discussed with Dr. July 2016, MD [facility]

## 2020-08-26 NOTE — Patient Instructions (Addendum)
It was great to meet you!  Our plans for today:  - I suspect your symptoms are related to your reflux - Keep taking your Aciphex - I have also prescribed Pepcid for you to take at bedtime for the next 2 weeks -Try to avoid spicy foods, acidic foods (citrus, tomatoes, etc), and alcohol. - Avoid eating within 1 hour of laying down  Please call you GI doctor at [phone] to schedule an appointment.  Take care and seek immediate care sooner if you develop any concerns.   Dr. [facility]

## 2020-08-27 NOTE — Assessment & Plan Note (Addendum)
Patient's symptoms of globus sensation consistent with her prior GERD. Barium swallow eval on 11/25/2019 was normal. Last EGD in June 2016 was normal. Reassured by the chronicity of her symptoms and that there is no difficulty swallowing solid foods or liquids. -We will trial H2 blocker (Pepcid 20 mg daily) x2 weeks -Recommend follow-up with GI -Continue Aciphex -Counseled on staying upright for at least 1hr after eating and avoiding spicy/acidic foods

## 2020-08-31 ENCOUNTER — Encounter: Payer: Self-pay | Admitting: Physician Assistant

## 2020-08-31 ENCOUNTER — Ambulatory Visit (INDEPENDENT_AMBULATORY_CARE_PROVIDER_SITE_OTHER): Payer: Medicare Other | Admitting: Physician Assistant

## 2020-08-31 VITALS — BP 132/76 | HR 92 | Ht 61.0 in | Wt 187.5 lb

## 2020-08-31 DIAGNOSIS — R1319 Other dysphagia: Secondary | ICD-10-CM

## 2020-08-31 DIAGNOSIS — K219 Gastro-esophageal reflux disease without esophagitis: Secondary | ICD-10-CM | POA: Diagnosis not present

## 2020-08-31 MED ORDER — PANTOPRAZOLE SODIUM 40 MG PO TBEC
40.0000 mg | DELAYED_RELEASE_TABLET | Freq: Every day | ORAL | 11 refills | Status: DC
Start: 2020-08-31 — End: 2021-04-27

## 2020-08-31 NOTE — Progress Notes (Signed)
Subjective:    Patient ID: Megan Miller, female    DOB: 1956-07-12, 65 y.o.   MRN: [id number]  HPI Megan Miller is a pleasant 64 year old African-American female, established with Dr. Rhea Belton with history of chronic GERD who comes in today with complaints of dysphagia. She was last seen in our office in April 2021 by myself at that time with similar complaints of intermittent solid food dysphagia.  Barium swallow was done April 2021 which showed no evidence of hiatal hernia, there was some spasm noted in the distal esophagus, barium tablet passed without difficulty, also noted small duodenal diverticulum. EGD in 2016 was normal. Last colonoscopy in 2013 per Dr. Ewing Schlein  negative other than internal and external hemorrhoids.  Patient has been maintained on AcipHex 20 mg p.o. every morning and recently had Pepcid 20 mg added at bedtime per her PCP. Today she says the Pepcid has not made any difference.  Despite taking the AcipHex she has been having fairly frequent intermittent heartburn and indigestion.  She continues to complain of dysphagia.  He says occasionally this will happen with liquids, notices on a regular basis most days with solid food.  She has a sensation that food is just sitting in the esophagus and not traversing to the stomach.  She gets a full feeling in the upper esophageal area.  She does not have any episodes requiring regurgitation and generally does not have to stop eating to complete a meal.  Appetite has been fine, weight has been stable.  She denies any current lower GI issues. No regular NSAID use.   Review of Systems Pertinent positive and negative review of systems were noted in the above HPI section.  All other review of systems was otherwise negative.  Outpatient Encounter Medications as of 08/31/2020  Medication Sig  . acetaminophen (TYLENOL 8 HOUR) 650 MG CR tablet Take 1 tablet (650 mg total) by mouth every 6 (six) hours as needed for pain.  Marland Kitchen aspirin 81 MG tablet  Take 1 tablet (81 mg total) by mouth daily.  Marland Kitchen atorvastatin (LIPITOR) 40 MG tablet TAKE 1 TABLET BY MOUTH EVERY DAY  . carvedilol (COREG) 3.125 MG tablet TAKE 1 TABLET (3.125 MG TOTAL) BY MOUTH 2 (TWO) TIMES DAILY WITH A MEAL.  . cetirizine (ZYRTEC) 10 MG tablet Take 1 tablet (10 mg total) by mouth daily.  Marland Kitchen desonide (DESOWEN) 0.05 % cream Apply topically 2 (two) times daily.  . diclofenac sodium (VOLTAREN) 1 % GEL Apply 2 g topically 4 (four) times daily.  . famotidine (PEPCID) 20 MG tablet Take 1 tablet (20 mg total) by mouth at bedtime.  . hydrOXYzine (ATARAX/VISTARIL) 10 MG tablet Take 1 tablet (10 mg total) by mouth 3 (three) times daily as needed.  Marland Kitchen ipratropium (ATROVENT) 0.06 % nasal spray Place 2 sprays into both nostrils 4 (four) times daily.  . nitroGLYCERIN (NITRODUR - DOSED IN MG/24 HR) 0.2 mg/hr patch APPLY 1/4 PATCH TO AFFECTED AREA. CHANGE PATCH EVERY 24 HOURS.  Marland Kitchen olopatadine (PATANOL) 0.1 % ophthalmic solution Place 1 drop into both eyes 2 (two) times daily as needed (seasonal allergies/itching).   . ondansetron (ZOFRAN ODT) 4 MG disintegrating tablet Take 1 tablet (4 mg total) by mouth every 8 (eight) hours as needed for nausea or vomiting.  . pantoprazole (PROTONIX) 40 MG tablet Take 1 tablet (40 mg total) by mouth daily.  . polyethylene glycol powder (GLYCOLAX/MIRALAX) 17 GM/SCOOP powder Dissolve 17grams in at at least 8 ounces of water or juice daily as  needed.  . triamcinolone cream (KENALOG) 0.1 % Apply 1 application topically 2 (two) times daily.  . [DISCONTINUED] RABEprazole (ACIPHEX) 20 MG tablet Take 1 tablet (20 mg total) by mouth daily. Brand Name Medically Neccessary.   No facility-administered encounter medications on file as of 08/31/2020.   Allergies  Allergen Reactions  . Percocet [Oxycodone-Acetaminophen] Nausea And Vomiting  . Codeine Phosphate Itching   Patient Active Problem List   Diagnosis Date Noted  . Urine frequency 04/21/2020  . Generalized  pruritus 03/22/2020  . Leg cramping 01/29/2020  . 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 Primary osteoarthritis of right knee 07/09/2018  . Gait instability 01/08/2018  . Chronic migraine without aura without status migrainosus, not intractable 11/13/2017  . Lumbosacral radiculopathy 11/12/2017  . Cerebrovascular accident (CVA) (HCC) 11/08/2017  . Spinal stenosis of lumbar region 11/08/2017  . Routine adult health maintenance 02/13/2016  . Syncope 11/15/2015  . Edema of right foot 01/27/2015  . Intertrigo 12/30/2014  . Stable angina (HCC) 09/23/2014  . Hot flashes 11/27/2013  . Hair loss 09/10/2013  . Depression 03/18/2013  . Dysuria 06/24/2012  . 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 OVERWEIGHT 05/05/2009  . CONSTIPATION, CHRONIC 02/10/2009  . DYSTHYMIC DISORDER 02/06/2008  . GERD 02/06/2008  . HYPERTENSION, BENIGN 10/09/2006  . Chronic low back pain 10/09/2006   Social History   Socioeconomic History  . Marital status: Divorced    Spouse name: Not on file  . Number of children: 0  . Years of education: Not on file  . Highest education level: Not on file  Occupational History  . Occupation: Disability  Tobacco Use  . Smoking status: Never Smoker  . Smokeless tobacco: Never Used  Vaping Use  . Vaping Use: Never used  Substance and Sexual Activity  . Alcohol use: No    Alcohol/week: 0.0 standard drinks  . Drug use: No  . Sexual activity: Never    Birth control/protection: Abstinence  Other Topics Concern  . Not on file  Social History Narrative  . Not on file   Social Determinants of Health   Financial Resource Strain: Not on file  Food Insecurity: Not on file  Transportation Needs: Not on file  Physical Activity: Not on file  Stress: Not on file  Social Connections: Not on file  Intimate Partner Violence: Not on file    Megan Miller's family history includes Asthma in her brother; Diabetes in her brother, father, and sister; Hypertension in her mother; Kidney disease in her  brother.      Objective:    Vitals:   08/31/20 1510  BP: 132/76  Pulse: 92    Physical Exam Well-developed well-nourished older in the African-American female in no acute distress.  Height, Weight, BMI 35  HEENT; nontraumatic normocephalic, EOMI, PE R LA, sclera anicteric. Oropharynx; not examined Neck; supple, no JVD Cardiovascular; regular rate and rhythm with S1-S2, no murmur rub or gallop Pulmonary; Clear bilaterally Abdomen; soft, nontender, nondistended, no palpable mass or hepatosplenomegaly, bowel sounds are active Rectal; not done today Skin; benign exam, no jaundice rash or appreciable lesions Extremities; no clubbing cyanosis or edema skin warm and dry Neuro/Psych; alert and oriented x4, grossly nonfocal mood and affect appropriate       Assessment & Plan:   #54 65 year old African-American female with history of chronic GERD and persistent complaints of frequent dysphagia primarily to solids with sensation of fullness in the throat, and food "sitting" in the esophagus.  No episodes of regurgitation. EGD 2016 - Barium swallow April  2021 revealed some spasm in the distal esophagus, no hiatal hernia and barium tablet passed without difficulty.  Rule out subtle esophageal stricture, rule out nonspecific dysmotility though not noted on barium swallow  Current poor control of GERD symptoms on current regimen  #2 colon cancer surveillance-up-to-date with negative colonoscopy 2013 will be due for follow-up 2023 #3 hypertension #4 prior CVA  Plan; stop AcipHex Start Protonix 40 mg p.o. every morning AC breakfast If Protonix is effective patient may discontinue p.m. Pepcid. Tighten up on antireflux diet and regimen, this was discussed with patient and she was given Transport planner. Patient will be scheduled for upper endoscopy with Dr. Rhea Belton with possible dilation, she may benefit from empiric dilation which was also discussed with patient.  Procedure was  discussed in detail with patient including indications risk and benefits and she is agreeable to proceed. Patient has completed COVID-19 vaccination.   Yosgar Demirjian S Mailey Landstrom PA-C 08/31/2020   Cc: [name], DO

## 2020-08-31 NOTE — Patient Instructions (Signed)
If you are age 66 or older, your body mass index should be between 23-30. Your Body mass index is 35.43 kg/m. If this is out of the aforementioned range listed, please consider follow up with your Primary Care Provider.  If you are age 27 or younger, your body mass index should be between 19-25. Your Body mass index is 35.43 kg/m. If this is out of the aformentioned range listed, please consider follow up with your Primary Care Provider.   You have been scheduled for an endoscopy. Please follow written instructions given to you at your visit today. If you use inhalers (even only as needed), please bring them with you on the day of your procedure.  STOP your Aciphex START Pantoprazole 40 mg 1 tablet every morning. This has been sent to your pharmacy.  You may stop the Famotidine if the Pantoprazole is working well for you.  Follow and anti-reflux regimen.  Thank you for entrusting me with your care and choosing [facility].  Amy Esterwood PA-C

## 2020-09-01 NOTE — Progress Notes (Signed)
Addendum: Reviewed and agree with assessment and management plan. Rufus Beske M, MD  

## 2020-09-06 NOTE — Progress Notes (Addendum)
     SUBJECTIVE:   CHIEF COMPLAINT / HPI:   Megan Miller is a 65 y.o. female presents with a fall and arm pain   Fall Pt was at [employer] yesterday, she lost balance and then she slipped on left side.  She fell on her outstretched left arm.  She was on the floor for a few minutes. Since then left shoulder, left hip and left hand has been hurting.  She has been able to weight bear but it does hurt when she walks.  Denies preceding symptoms such as  chest pain, palpitations, dizziness, slurring of speech, limb weakness or vision changes.  Denies loss of consciousness or head injury after the fall.  Has not taken any pain medications yet.   PERTINENT  PMH / PSH: CVA, HTN, angina   OBJECTIVE:   BP 132/68   Pulse 91   Ht 5\' 1"  (1.549 m)   Wt 188 lb 8 oz (85.5 kg)   SpO2 98%   BMI 35.62 kg/m    General: Alert, no acute distress, well appearing Cardio: Normal S1 and S2, RRR, no r/m/g Pulm: CTAB, normal work of breathing Abdomen: Bowel sounds normal. Abdomen soft and non-tender.  Extremities: No peripheral edema.  Neuro: Cranial nerves grossly intact   Antalgic gait  Left shoulder Inspection: No obvious deformities, erythema, skin change Palpation: Mild tenderness on palpation of glenohumeral joint ROM: Full range of motion however tenderness on flexion, extension and abduction at 90 degrees Strength: 5/5 Neurovascular: Normal  Left wrist  Inspection: No obvious deformities, erythema, skin changes Palpation: Mild tenderness on palpation of radiocarpal joint, no anatomical snuffbox tenderness  ROM: Full range of motion on flexion, extension, supination pronation Strength: 5/5 Neurovascular: Normal  Left hip Inspection: No obvious deformities, no external rotation of LE Palpation: Tenderness on palpation of left greater trochanter ROM: Normal hip flexion and extension Strength: 5/5  Neurovascular: Normal Special test: Negative straight leg  raise.  ASSESSMENT/PLAN:   Fall Pt has musculoskeletal pain following her fall. Physical exam is reassuring with no obvious deformities but with tenderness on palpation to left glenohumeral joint, wrist joint and left greater trochanter.  No anatomical snuffbox pain or deformity. Fractures unlikely however will obtain left hip, left shoulder, left wrist and forearm x-rays.  No preceding symptoms prior to fall, no loss of consciousness or head injury which is reassuring.  Recommended rest, Tylenol, ice and compression for the pain.  Patient will follow up with PCP for results.     , MD PGY-2 [facility]

## 2020-09-07 ENCOUNTER — Encounter: Payer: Self-pay | Admitting: Family Medicine

## 2020-09-07 ENCOUNTER — Ambulatory Visit (INDEPENDENT_AMBULATORY_CARE_PROVIDER_SITE_OTHER): Payer: Medicare Other | Admitting: Family Medicine

## 2020-09-07 ENCOUNTER — Other Ambulatory Visit: Payer: Self-pay

## 2020-09-07 VITALS — BP 132/68 | HR 91 | Ht 61.0 in | Wt 188.5 lb

## 2020-09-07 DIAGNOSIS — Z23 Encounter for immunization: Secondary | ICD-10-CM | POA: Diagnosis not present

## 2020-09-07 DIAGNOSIS — M25552 Pain in left hip: Secondary | ICD-10-CM

## 2020-09-07 DIAGNOSIS — M1711 Unilateral primary osteoarthritis, right knee: Secondary | ICD-10-CM | POA: Diagnosis not present

## 2020-09-07 DIAGNOSIS — W19XXXA Unspecified fall, initial encounter: Secondary | ICD-10-CM | POA: Insufficient documentation

## 2020-09-07 MED ORDER — ACETAMINOPHEN ER 650 MG PO TBCR: 650.0000 mg | EXTENDED_RELEASE_TABLET | Freq: Four times a day (QID) | ORAL | 0 refills | Status: DC | PRN

## 2020-09-07 NOTE — Assessment & Plan Note (Addendum)
Pt has musculoskeletal pain following her fall. Physical exam is reassuring with no obvious deformities but with tenderness on palpation to left glenohumeral joint, wrist joint and left greater trochanter.  No anatomical snuffbox pain or deformity. Fractures unlikely however will obtain left hip, left shoulder, left wrist and forearm x-rays.  No preceding symptoms prior to fall, no loss of consciousness or head injury which is reassuring.  Recommended rest, Tylenol, ice and compression for the pain.  Patient will follow up with PCP for results.

## 2020-09-07 NOTE — Patient Instructions (Signed)
Thank you for coming to see me today. It was a pleasure. Today we discussed your fall. I recommend getting some xrays of the left arm and hip to see  If there is a fracture. I dont think there is. I think it is soft tissue injury.   I have placed an order for xrays  Please go to [facility] at [employer] [facility] or at [facility] to have this completed.  You do not need an appointment, but if you would like to call them beforehand, their number is [phone].  We will contact you with your results afterwards.  Please follow-up with me or your PCP in 1-2 weeks.  If you have any questions or concerns, please do not hesitate to call the office at [phone].  Best wishes,   Dr Allena Katz

## 2020-09-09 ENCOUNTER — Other Ambulatory Visit: Payer: Self-pay | Admitting: Family Medicine

## 2020-09-09 ENCOUNTER — Other Ambulatory Visit: Payer: Self-pay

## 2020-09-09 ENCOUNTER — Ambulatory Visit
Admission: RE | Admit: 2020-09-09 | Discharge: 2020-09-09 | Disposition: A | Discharge status: Home / Self Care | Payer: Medicare Other | Source: Ambulatory Visit | Attending: Family Medicine | Admitting: Family Medicine

## 2020-09-09 DIAGNOSIS — W19XXXA Unspecified fall, initial encounter: Secondary | ICD-10-CM

## 2020-09-12 ENCOUNTER — Telehealth: Payer: Self-pay

## 2020-09-12 ENCOUNTER — Telehealth: Payer: Self-pay | Admitting: Family Medicine

## 2020-09-12 ENCOUNTER — Other Ambulatory Visit: Payer: Self-pay | Admitting: Family Medicine

## 2020-09-12 DIAGNOSIS — S62102A Fracture of unspecified carpal bone, left wrist, initial encounter for closed fracture: Secondary | ICD-10-CM

## 2020-09-12 NOTE — Telephone Encounter (Signed)
Thank you I have notified pt. Referred to orthopedics and ATC app later this week.

## 2020-09-12 NOTE — Telephone Encounter (Signed)
Received phone call from [facility] at [facility] regarding call report for wrist X-ray from 2/11. See below.   Findings suspicious for nondisplaced impaction fracture of the distal radial metaphysis.  Will forward to ordering provider.   Veronda Prude, RN

## 2020-09-12 NOTE — Telephone Encounter (Signed)
Called pt to inform her that she has a wrist fracture and that it needs to be immobilized in a cast. Recommended going to urgent care however pt does not know where to go and cannot get a ride. She would rather come into the clinic. Booked ATC app on Wednesday 16th Feb. Referred to orthopedics. Will forward to PCP.  Towanda Octave MD

## 2020-09-14 ENCOUNTER — Other Ambulatory Visit: Payer: Self-pay | Admitting: Family Medicine

## 2020-09-14 ENCOUNTER — Ambulatory Visit (INDEPENDENT_AMBULATORY_CARE_PROVIDER_SITE_OTHER): Payer: Medicare Other | Admitting: Family Medicine

## 2020-09-14 ENCOUNTER — Other Ambulatory Visit: Payer: Self-pay

## 2020-09-14 ENCOUNTER — Encounter: Payer: Self-pay | Admitting: Family Medicine

## 2020-09-14 VITALS — BP 140/80 | Ht 61.0 in | Wt 187.0 lb

## 2020-09-14 VITALS — BP 136/72 | HR 97 | Ht 61.0 in | Wt 187.6 lb

## 2020-09-14 DIAGNOSIS — S6992XA Unspecified injury of left wrist, hand and finger(s), initial encounter: Secondary | ICD-10-CM

## 2020-09-14 DIAGNOSIS — M25512 Pain in left shoulder: Secondary | ICD-10-CM | POA: Diagnosis not present

## 2020-09-14 DIAGNOSIS — S52502A Unspecified fracture of the lower end of left radius, initial encounter for closed fracture: Secondary | ICD-10-CM | POA: Insufficient documentation

## 2020-09-14 DIAGNOSIS — S52502D Unspecified fracture of the lower end of left radius, subsequent encounter for closed fracture with routine healing: Secondary | ICD-10-CM

## 2020-09-14 NOTE — Progress Notes (Signed)
PCP: [name], DO  Subjective:   HPI: Patient is a 65 y.o. female here for left wrist injury.  Patient reports about a week ago she was coming out of [place] and missed a step coming off the ramp. Fell down to her left side on left shoulder, hand. She reports overall she has improved since that fall though left shoulder laterally bothers her the most. Worse reaching overhead. Taking tylenol with minimal benefit, not icing. She reports wrist has minimal pain and only when fully bending back. Radiographs showed possible distal radius fracture so referred to [place].  Past Medical History:  Diagnosis Date  . Allergic conjunctivitis   . Chest pain   . Chronic back pain   . Constipation, chronic   . Dyspnea on exertion   . Dysthymic   . GERD (gastroesophageal reflux disease)   . Hypertension   . Leg edema    Asymetric Right leg (dopplers negative)  . Overweight (BMI 25.0-29.9)   . Stroke (HCC)   . Urination frequency     Current Outpatient Medications on File Prior to Visit  Medication Sig Dispense Refill  . acetaminophen (TYLENOL 8 HOUR) 650 MG CR tablet Take 1 tablet (650 mg total) by mouth every 6 (six) hours as needed for pain. 60 tablet 0  . ACIPHEX 20 MG tablet Take 20 mg by mouth daily.    Marland Kitchen aspirin 81 MG tablet Take 1 tablet (81 mg total) by mouth daily. 90 tablet 3  . atorvastatin (LIPITOR) 40 MG tablet TAKE 1 TABLET BY MOUTH EVERY DAY 90 tablet 3  . carvedilol (COREG) 3.125 MG tablet TAKE 1 TABLET (3.125 MG TOTAL) BY MOUTH 2 (TWO) TIMES DAILY WITH A MEAL. 120 tablet 2  . cetirizine (ZYRTEC) 10 MG tablet Take 1 tablet (10 mg total) by mouth daily. 30 tablet 3  . desonide (DESOWEN) 0.05 % cream Apply topically 2 (two) times daily. 30 g 0  . diclofenac sodium (VOLTAREN) 1 % GEL Apply 2 g topically 4 (four) times daily. 1 Tube 0  . famotidine (PEPCID) 20 MG tablet Take 1 tablet (20 mg total) by mouth at bedtime. 14 tablet 0  . hydrOXYzine (ATARAX/VISTARIL) 10 MG  tablet Take 1 tablet (10 mg total) by mouth 3 (three) times daily as needed. 30 tablet 0  . ipratropium (ATROVENT) 0.06 % nasal spray Place 2 sprays into both nostrils 4 (four) times daily. 15 mL 0  . nitroGLYCERIN (NITRODUR - DOSED IN MG/24 HR) 0.2 mg/hr patch APPLY 1/4 PATCH TO AFFECTED AREA. CHANGE PATCH EVERY 24 HOURS. 30 patch 1  . olopatadine (PATANOL) 0.1 % ophthalmic solution Place 1 drop into both eyes 2 (two) times daily as needed (seasonal allergies/itching).     . ondansetron (ZOFRAN ODT) 4 MG disintegrating tablet Take 1 tablet (4 mg total) by mouth every 8 (eight) hours as needed for nausea or vomiting. 10 tablet 0  . pantoprazole (PROTONIX) 40 MG tablet Take 1 tablet (40 mg total) by mouth daily. 30 tablet 11  . polyethylene glycol powder (GLYCOLAX/MIRALAX) 17 GM/SCOOP powder Dissolve 17grams in at at least 8 ounces of water or juice daily as needed. 255 g 3  . triamcinolone cream (KENALOG) 0.1 % Apply 1 application topically 2 (two) times daily. 30 g 0   No current facility-administered medications on file prior to visit.    Past Surgical History:  Procedure Laterality Date  . 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 BILATERAL SALPINGOOPHORECTOMY  05/08/2001  Path=extensive endometriosis  . BREAST SURGERY  12/29/2003   Right breast core biopsy=benign fibrocystic change  . SMALL INTESTINE SURGERY  03/30/2001   Rectosigmoid bowel resection and reanastomosis    Allergies  Allergen Reactions  . Percocet [Oxycodone-Acetaminophen] Nausea And Vomiting  . Codeine Phosphate Itching    Social History   Socioeconomic History  . Marital status: Divorced    Spouse name: Not on file  . Number of children: 0  . Years of education: Not on file  . Highest education level: Not on file  Occupational History  . Occupation: Disability  Tobacco Use  . Smoking status: Never Smoker  . Smokeless tobacco: Never Used  Vaping Use  . Vaping Use: Never used  Substance and  Sexual Activity  . Alcohol use: No    Alcohol/week: 0.0 standard drinks  . Drug use: No  . Sexual activity: Never    Birth control/protection: Abstinence  Other Topics Concern  . Not on file  Social History Narrative  . Not on file   Social Determinants of Health   Financial Resource Strain: Not on file  Food Insecurity: Not on file  Transportation Needs: Not on file  Physical Activity: Not on file  Stress: Not on file  Social Connections: Not on file  Intimate Partner Violence: Not on file    Family History  Problem Relation Age of Onset  . Hypertension Mother   . Diabetes Father   . Diabetes Sister   . Asthma Brother   . Diabetes Brother   . Kidney disease Brother   . Colon cancer Neg Hx   . Colon polyps Neg Hx   . Esophageal cancer Neg Hx   . Breast cancer Neg Hx   . Pancreatic cancer Neg Hx   . Stomach cancer Neg Hx     BP 140/80   Ht 5\' 1"  (1.549 m)   Wt 187 lb (84.8 kg)   BMI 35.33 kg/m   [facility] 09/14/2020  Frequency of aerobic exercise (# of days/week) 0  Average time in minutes 0  Frequency of strengthening activities (# of days/week) 0    No flowsheet data found.  Review of Systems: See HPI above.     Objective:  Physical Exam:  Gen: NAD, comfortable in exam room  Left wrist: No deformity, swelling, bruising. FROM with 5/5 strength.  Pain with full dorsiflexion. No tenderness to palpation distal radius, scaphoid. NVI distally.   Left shoulder: No swelling, ecchymoses.  No gross deformity. No TTP. FROM with painful arc. Negative Hawkins, Neers. Negative Yergasons. Strength 5/5 with empty can and resisted internal/external rotation. Negative apprehension. NV intact distally.  Assessment & Plan:  1. Left wrist injury - independently reviewed radiographs, correlation with exam indicate this is not an acute fracture.  Reassured patient.  Consistent with wrist sprain.  Tylenol, aleve.  Discussed  consideration of wrist brace but do not think she needs at this point and encouraged to do easy motion.  2. Left shoulder pain - radiographs negative.  2/2 rotator cuff contusion.  Tylenol, aleve, icing.  Shown home exercise program to start in 1-2 weeks.  F/u in 4 weeks.

## 2020-09-14 NOTE — Patient Instructions (Signed)
It was nice to see you today,  You have an appointment at the sports medicine center at 11:30 AM this morning.  It is important that you get this cast done today.  If you need money for a ride home after, we can provide that for you.  Please follow-up with the orthopedic surgeon for further management of this.  Have a great day,  Frederic Jericho, MD

## 2020-09-14 NOTE — Patient Instructions (Signed)
Your x-rays are normal - the area in question is just where you have a little more bone formation but it's not from a fracture. Tylenol 500mg  1-2 tabs three times a day. Aleve 2 tabs twice a day with food - take for 7 days then as needed.  You bruised your rotator cuff on the left side. Medications as above. Icing 15 minutes at a time 3-4 times a day. Wait a week before starting the exercises. Follow up with in 4 weeks for reevaluation.

## 2020-09-14 NOTE — Progress Notes (Signed)
    SUBJECTIVE:   CHIEF COMPLAINT / HPI:  Patient is here for follow-up of her wrist fracture and to get it splinted until she can see the orthopedist.  Patient came to appointment 17 minutes late.  Was unable to apply cast for patient today.  Called sports medicine clinic and scheduled her for an appointment today for casting.  Advised patient to follow-up with this appointment today and to follow through with orthopedist referral.  Sandre Kitty, MD [facility]

## 2020-09-16 ENCOUNTER — Other Ambulatory Visit: Payer: Self-pay | Admitting: Family Medicine

## 2020-09-20 ENCOUNTER — Ambulatory Visit: Payer: Medicare Other | Admitting: Orthopaedic Surgery

## 2020-09-27 ENCOUNTER — Encounter: Payer: Self-pay | Admitting: Orthopaedic Surgery

## 2020-09-27 ENCOUNTER — Other Ambulatory Visit: Payer: Self-pay

## 2020-09-27 ENCOUNTER — Ambulatory Visit (INDEPENDENT_AMBULATORY_CARE_PROVIDER_SITE_OTHER): Payer: Medicare Other | Admitting: Orthopaedic Surgery

## 2020-09-27 ENCOUNTER — Ambulatory Visit (INDEPENDENT_AMBULATORY_CARE_PROVIDER_SITE_OTHER): Payer: Medicare Other

## 2020-09-27 DIAGNOSIS — S52502A Unspecified fracture of the lower end of left radius, initial encounter for closed fracture: Secondary | ICD-10-CM

## 2020-09-27 MED ORDER — TRAMADOL HCL 50 MG PO TABS: 50.0000 mg | ORAL_TABLET | Freq: Three times a day (TID) | ORAL | 0 refills | Status: DC | PRN

## 2020-09-27 NOTE — Progress Notes (Signed)
Office Visit Note   Patient: Megan Miller           Date of Birth: 09-Jul-1956           MRN: [id number] Visit Date: 09/27/2020              Requested by: Moses Manners, MD [address] [place],  [place] [address] PCP: [name], DO   Assessment & Plan: Visit Diagnoses:  1. Closed fracture of distal end of left radius, unspecified fracture morphology, initial encounter     Plan: Impression is several weeks status post left wrist impaction fracture with radiographic and clinical evidence of healing.  I do not believe the patient needs therapy at this point as she has near full range of motion.  I have agreed to call in tramadol to take as needed.  She will advance with activity as tolerated let pain be her guide.  Follow-up with [place] in 6 weeks for recheck and three-view x-rays of the left wrist..  Call with concerns or questions.  Follow-Up Instructions: Return in about 6 weeks (around 11/08/2020).   Orders:  Orders Placed This Encounter  Procedures   XR Wrist Complete Left   No orders of the defined types were placed in this encounter.     Procedures: No procedures performed   Clinical Data: No additional findings.   Subjective: Chief Complaint  Patient presents with   Left Wrist - Pain    HPI patient is a pleasant 65 year old right-hand-dominant female who comes in today with an injury to her left hand.  She sustained a mechanical fall back in January where she landed on her left outstretched hand.  She is unsure exactly when in January this occurred.  She notes that her pain has mildly improved since.  She had x-rays 2 weeks ago which showed a possible impaction fracture.  She comes in today for further evaluation treatment recommendation.  The pain she has is throughout the entire wrist worse when she is holding her tablet.  She has been taking aspirin without significant relief of symptoms.  No paresthesias.  Review of Systems as detailed in  HPI.  All others reviewed and are negative.   Objective: Vital Signs: There were no vitals taken for this visit.  Physical Exam well-developed well-nourished female no acute distress.  Alert oriented x3.  Ortho Exam left wrist exam shows no tenderness.  No swelling.  Full range of motion.  She is neurovascular intact distally.  Specialty Comments:  No specialty comments available.  Imaging: XR Wrist Complete Left  Result Date: 09/27/2020 X-rays demonstrate impaction fracture with bony consolidation    PMFS History: Patient Active Problem List   Diagnosis Date Noted   Distal radius fracture, left 09/14/2020   Fall 09/07/2020   Urine frequency 04/21/2020   Generalized pruritus 03/22/2020   Leg cramping 01/29/2020   Vestibular neuritis 09/25/2019   Primary osteoarthritis of right knee 07/09/2018   Gait instability 01/08/2018   Chronic migraine without aura without status migrainosus, not intractable 11/13/2017   Lumbosacral radiculopathy 11/12/2017   Cerebrovascular accident (CVA) (HCC) 11/08/2017   Spinal stenosis of lumbar region 11/08/2017   Routine adult health maintenance 02/13/2016   Syncope 11/15/2015   Edema of right foot 01/27/2015   Intertrigo 12/30/2014   Stable angina (HCC) 09/23/2014   Hot flashes 11/27/2013   Hair loss 09/10/2013   Depression 03/18/2013   Dysuria 06/24/2012   MYOPIA 08/25/2009   OVERWEIGHT 05/05/2009   CONSTIPATION,  CHRONIC 02/10/2009   DYSTHYMIC DISORDER 02/06/2008   GERD 02/06/2008   HYPERTENSION, BENIGN 10/09/2006   Chronic low back pain 10/09/2006   Past Medical History:  Diagnosis Date   Allergic conjunctivitis    Chest pain    Chronic back pain    Constipation, chronic    Dyspnea on exertion    Dysthymic    GERD (gastroesophageal reflux disease)    Hypertension    Leg edema    Asymetric Right leg (dopplers negative)   Overweight (BMI 25.0-29.9)    Stroke (HCC)    Urination  frequency     Family History  Problem Relation Age of Onset   Hypertension Mother    Diabetes Father    Diabetes Sister    Asthma Brother    Diabetes Brother    Kidney disease Brother    Colon cancer Neg Hx    Colon polyps Neg Hx    Esophageal cancer Neg Hx    Breast cancer Neg Hx    Pancreatic cancer Neg Hx    Stomach cancer Neg Hx     Past Surgical History:  Procedure Laterality Date   ABDOMINAL HYSTERECTOMY  09/28/1999   for leimyomas   BILATERAL SALPINGOOPHORECTOMY  05/08/2001   Path=extensive endometriosis   BREAST SURGERY  12/29/2003   Right breast core biopsy=benign fibrocystic change   SMALL INTESTINE SURGERY  03/30/2001   Rectosigmoid bowel resection and reanastomosis   Social History   Occupational History   Occupation: Disability  Tobacco Use   Smoking status: Never Smoker   Smokeless tobacco: Never Used  Building services engineer Use: Never used  Substance and Sexual Activity   Alcohol use: No    Alcohol/week: 0.0 standard drinks   Drug use: No   Sexual activity: Never    Birth control/protection: Abstinence

## 2020-09-30 ENCOUNTER — Encounter: Payer: Self-pay | Admitting: Internal Medicine

## 2020-09-30 ENCOUNTER — Other Ambulatory Visit: Payer: Self-pay

## 2020-09-30 ENCOUNTER — Ambulatory Visit (AMBULATORY_SURGERY_CENTER): Payer: Medicare Other | Admitting: Internal Medicine

## 2020-09-30 VITALS — BP 143/68 | HR 73 | Temp 97.4°F | Resp 17 | Ht 61.0 in | Wt 187.0 lb

## 2020-09-30 DIAGNOSIS — K222 Esophageal obstruction: Secondary | ICD-10-CM

## 2020-09-30 DIAGNOSIS — K449 Diaphragmatic hernia without obstruction or gangrene: Secondary | ICD-10-CM | POA: Diagnosis not present

## 2020-09-30 DIAGNOSIS — R1319 Other dysphagia: Secondary | ICD-10-CM | POA: Diagnosis not present

## 2020-09-30 DIAGNOSIS — K219 Gastro-esophageal reflux disease without esophagitis: Secondary | ICD-10-CM

## 2020-09-30 MED ORDER — SODIUM CHLORIDE 0.9 % IV SOLN: 500.0000 mL | Freq: Once | INTRAVENOUS | Status: DC

## 2020-09-30 NOTE — Progress Notes (Signed)
To PACU, VSS. Report to [email or web address] 

## 2020-09-30 NOTE — Patient Instructions (Signed)
Start post dilation diet today Continue current medications Await pathology results  YOU HAD AN ENDOSCOPIC PROCEDURE TODAY AT THE [facility]:   Refer to the procedure report that was given to you for any specific questions about what was found during the examination.  If the procedure report does not answer your questions, please call your gastroenterologist to clarify.  If you requested that your care partner not be given the details of your procedure findings, then the procedure report has been included in a sealed envelope for you to review at your convenience later.  YOU SHOULD EXPECT: Some feelings of bloating in the abdomen. Passage of more gas than usual.  Walking can help get rid of the air that was put into your GI tract during the procedure and reduce the bloating. If you had a lower endoscopy (such as a colonoscopy or flexible sigmoidoscopy) you may notice spotting of blood in your stool or on the toilet paper. If you underwent a bowel prep for your procedure, you may not have a normal bowel movement for a few days.  Please Note:  You might notice some irritation and congestion in your nose or some drainage.  This is from the oxygen used during your procedure.  There is no need for concern and it should clear up in a day or so.  SYMPTOMS TO REPORT IMMEDIATELY:   following upper endoscopy (EGD)  Vomiting of blood or coffee ground material  New chest pain or pain under the shoulder blades  Painful or persistently difficult swallowing  New shortness of breath  Fever of 100F or higher  Black, tarry-looking stools  For urgent or emergent issues, a gastroenterologist can be reached at any hour by calling [phone]. Do not use MyChart messaging for urgent concerns.   DIET:  We do recommend a small meal at first, but then you may proceed to your regular diet.  Drink plenty of fluids but you should avoid alcoholic beverages for 24 hours.  ACTIVITY:  You should plan to  take it easy for the rest of today and you should NOT DRIVE or use heavy machinery until tomorrow (because of the sedation medicines used during the test).    FOLLOW UP: Our staff will call the number listed on your records 48-72 hours following your procedure to check on you and address any questions or concerns that you may have regarding the information given to you following your procedure. If we do not reach you, we will leave a message.  We will attempt to reach you two times.  During this call, we will ask if you have developed any symptoms of COVID 19. If you develop any symptoms (ie: fever, flu-like symptoms, shortness of breath, cough etc.) before then, please call [phone].  If you test positive for Covid 19 in the 2 weeks post procedure, please call and report this information to [place].    If any biopsies were taken you will be contacted by phone or by letter within the next 1-3 weeks.  Please call us at [phone] if you have not heard about the biopsies in 3 weeks.   SIGNATURES/CONFIDENTIALITY: You and/or your care partner have signed paperwork which will be entered into your electronic medical record.  These signatures attest to the fact that that the information above on your After Visit Summary has been reviewed and is understood.  Full responsibility of the confidentiality of this discharge information lies with you and/or your care-partner.For urgent or emergent issues,  a gastroenterologist can be reached at any hour by calling [phone]. Do not use MyChart messaging for urgent concerns.   DIET:  We do recommend a small meal at first, but then you may proceed to your regular diet.  Drink plenty of fluids but you should avoid alcoholic beverages for 24 hours.  ACTIVITY:  You should plan to take it easy for the rest of today and you should NOT DRIVE or use heavy machinery until tomorrow (because of the sedation medicines used during the test).    FOLLOW UP: Our staff will  call the number listed on your records 48-72 hours following your procedure to check on you and address any questions or concerns that you may have regarding the information given to you following your procedure. If we do not reach you, we will leave a message.  We will attempt to reach you two times.  During this call, we will ask if you have developed any symptoms of COVID 19. If you develop any symptoms (ie: fever, flu-like symptoms, shortness of breath, cough etc.) before then, please call [phone].  If you test positive for Covid 19 in the 2 weeks post procedure, please call and report this information to [place].    If any biopsies were taken you will be contacted by phone or by letter within the next 1-3 weeks.  Please call us at [phone] if you have not heard about the biopsies in 3 weeks.   SIGNATURES/CONFIDENTIALITY: You and/or your care partner have signed paperwork which will be entered into your electronic medical record.  These signatures attest to the fact that that the information above on your After Visit Summary has been reviewed and is understood.  Full responsibility of the confidentiality of this discharge information lies with you and/or your care-partner.

## 2020-09-30 NOTE — Progress Notes (Signed)
VS-CW  Pt has several chipped teeth on upper and lower. Also has multiple missing teeth.

## 2020-09-30 NOTE — Op Note (Signed)
[facility] Patient Name: Gwynn Hein Procedure Date: 09/30/2020 10:26 AM MRN: [id number] Endoscopist: Beverley Fiedler , MD Age: 65 Referring MD:  Date of Birth: [birth year] Gender: Female Account #: [id number] Procedure:                Upper GI endoscopy Indications:              Dysphagia, Gastro-esophageal reflux disease Medicines:                Monitored Anesthesia Care Procedure:                Pre-Anesthesia Assessment:                           - Prior to the procedure, a History and Physical                            was performed, and patient medications and                            allergies were reviewed. The patient's tolerance of                            previous anesthesia was also reviewed. The risks                            and benefits of the procedure and the sedation                            options and risks were discussed with the patient.                            All questions were answered, and informed consent                            was obtained. Prior Anticoagulants: The patient has                            taken no previous anticoagulant or antiplatelet                            agents. ASA Grade Assessment: III - A patient with                            severe systemic disease. After reviewing the risks                            and benefits, the patient was deemed in                            satisfactory condition to undergo the procedure.                           After obtaining informed consent, the endoscope was  passed under direct vision. Throughout the                            procedure, the patient's blood pressure, pulse, and                            oxygen saturations were monitored continuously. The                            Endoscope was introduced through the mouth, and                            advanced to the second part of duodenum. The upper                            GI  endoscopy was accomplished without difficulty.                            The patient tolerated the procedure well. Scope In: Scope Out: Findings:                 Mucosal changes including ringed esophagus and                            feline appearance were found in the middle third of                            the esophagus and in the lower third of the                            esophagus. Esophageal findings were graded using                            the Eosinophilic Esophagitis Endoscopic Reference                            Score (EoE-EREFS) as: Edema Grade 0 Normal                            (distinct vascular markings), Rings Grade 2                            Moderate (distinct rings that do not occlude                            passage of diagnostic 8-10 mm endoscope), Exudates                            Grade 0 None (no white lesions seen), Furrows Grade                            0 None (no vertical lines seen) and stricture  present (discovered after dilation). Biopsies were                            obtained from the proximal and distal esophagus                            with cold forceps for histology to exclude                            eosinophilic esophagitis. The scope was withdrawn.                            Dilation was performed with a Maloney dilator with                            mild resistance at 50 Fr. The dilation site was                            examined and showed moderate mucosal disruption in                            the proximal esophagus (consistent with an occult                            stricture).                           A small, 1-2 cm, hiatal hernia was present.                           The entire examined stomach was normal.                           The examined duodenum was normal. Complications:            No immediate complications. Estimated Blood Loss:     Estimated blood loss was  minimal. Impression:               - Esophageal mucosal changes suspicious for                            eosinophilic esophagitis. Dilated with 50 Fr                            Maloney and then biopsied on relook.                           - Small hiatal hernia.                           - Normal stomach.                           - Normal examined duodenum. Recommendation:           - Patient has a contact number available for  emergencies. The signs and symptoms of potential                            delayed complications were discussed with the                            patient. Return to normal activities tomorrow.                            Written discharge instructions were provided to the                            patient.                           - Post-dilation diet and then advance diet as                            tolerated.                           - Continue present medications.                           - Await pathology results.                           - Office follow-up in 3 months or so with me or Amy                            [place], PA-C. Beverley Fiedler, MD 09/30/2020 10:49:20 AM This report has been signed electronically.

## 2020-10-04 ENCOUNTER — Telehealth: Payer: Self-pay

## 2020-10-04 NOTE — Telephone Encounter (Signed)
  Follow up Call-  Call back number 09/30/2020  Post procedure Call Back phone  # [phone]  Permission to leave phone message Yes  Some recent data might be hidden     Patient questions:  Do you have a fever, pain , or abdominal swelling? No. Pain Score  0 *  Have you tolerated food without any problems? Yes.    Have you been able to return to your normal activities? Yes.    Do you have any questions about your discharge instructions: Diet   No. Medications  No. Follow up visit  No.  Do you have questions or concerns about your Care? No.  Actions: * If pain score is 4 or above: No action needed, pain <4. 1. Have you developed a fever since your procedure? no  2.   Have you had an respiratory symptoms (SOB or cough) since your procedure? no  3.   Have you tested positive for COVID 19 since your procedure no  4.   Have you had any family members/close contacts diagnosed with the COVID 19 since your procedure?  no   If yes to any of these questions please route to Laverna Peace, RN and Karlton Lemon, RN

## 2020-10-06 ENCOUNTER — Encounter: Payer: Self-pay | Admitting: Internal Medicine

## 2020-11-08 ENCOUNTER — Ambulatory Visit: Payer: Medicare Other | Admitting: Orthopaedic Surgery

## 2020-11-14 ENCOUNTER — Other Ambulatory Visit: Payer: Self-pay | Admitting: Family Medicine

## 2020-11-16 ENCOUNTER — Telehealth: Payer: Self-pay

## 2020-11-16 NOTE — Telephone Encounter (Signed)
Patient LVM on nurse line requesting new referral. Called patient to gather more information. Patient did not answer, left HIPAA compliant VM for patient to return call to office to discuss further.   Veronda Prude, RN

## 2020-11-18 NOTE — Telephone Encounter (Signed)
Patient LVM on nurse line requesting that we refer her to a different throat doctor. I was not able to find where she has been referred to an ENT. Attempted to call patient to gather more information. Patient did not answer, left VM for patient to return call to office.   Veronda Prude, RN

## 2020-11-22 ENCOUNTER — Encounter: Payer: Self-pay | Admitting: Orthopaedic Surgery

## 2020-11-22 ENCOUNTER — Ambulatory Visit (INDEPENDENT_AMBULATORY_CARE_PROVIDER_SITE_OTHER): Payer: Medicare Other | Admitting: Orthopaedic Surgery

## 2020-11-22 ENCOUNTER — Ambulatory Visit (INDEPENDENT_AMBULATORY_CARE_PROVIDER_SITE_OTHER): Payer: Medicare Other

## 2020-11-22 DIAGNOSIS — S52502A Unspecified fracture of the lower end of left radius, initial encounter for closed fracture: Secondary | ICD-10-CM

## 2020-11-22 NOTE — Progress Notes (Signed)
Office Visit Note   Patient: Megan Miller           Date of Birth: March 01, 1956           MRN: [id number] Visit Date: 11/22/2020              Requested by: [name], DO [address] [place],  [place] [address] PCP: [name], DO   Assessment & Plan: Visit Diagnoses:  1. Closed fracture of distal end of left radius, unspecified fracture morphology, initial encounter     Plan: At this point [name] has demonstrated clinical and radiographic healing of the radial styloid fracture.  She may use her hand and wrist as tolerated based on discomfort.  We will see her back as needed.  Follow-Up Instructions: Return if symptoms worsen or fail to improve.   Orders:  Orders Placed This Encounter  Procedures  . XR Wrist Complete Left   No orders of the defined types were placed in this encounter.     Procedures: No procedures performed   Clinical Data: No additional findings.   Subjective: Chief Complaint  Patient presents with  . Left Wrist - Pain, Follow-up    [name] returns today for follow-up of left radial styloid fracture.  Overall she is doing okay and pain is improving.  She takes tramadol as needed.   Review of Systems   Objective: Vital Signs: There were no vitals taken for this visit.  Physical Exam  Ortho Exam Left wrist shows no swelling or tenderness to palpation of the fracture site.  Range of motion is essentially normal. Specialty Comments:  No specialty comments available.  Imaging: XR Wrist Complete Left  Result Date: 11/22/2020 Healed radial styloid fracture.    PMFS History: Patient Active Problem List   Diagnosis Date Noted  . Distal radius fracture, left 09/14/2020  . Fall 09/07/2020  . Urine frequency 04/21/2020  . Generalized pruritus 03/22/2020  . Leg cramping 01/29/2020  . 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 Primary osteoarthritis of right knee 07/09/2018  . Gait instability 01/08/2018  . Chronic migraine  without aura without status migrainosus, not intractable 11/13/2017  . Lumbosacral radiculopathy 11/12/2017  . Cerebrovascular accident (CVA) (HCC) 11/08/2017  . Spinal stenosis of lumbar region 11/08/2017  . Routine adult health maintenance 02/13/2016  . Syncope 11/15/2015  . Edema of right foot 01/27/2015  . Intertrigo 12/30/2014  . Stable angina (HCC) 09/23/2014  . Hot flashes 11/27/2013  . Hair loss 09/10/2013  . Depression 03/18/2013  . Dysuria 06/24/2012  . 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 OVERWEIGHT 05/05/2009  . CONSTIPATION, CHRONIC 02/10/2009  . DYSTHYMIC DISORDER 02/06/2008  . GERD 02/06/2008  . HYPERTENSION, BENIGN 10/09/2006  . Chronic low back pain 10/09/2006   Past Medical History:  Diagnosis Date  . Allergic conjunctivitis   . Arthritis   . Chest pain   . Chronic back pain   . Constipation, chronic   . Dyspnea on exertion   . Dysthymic   . GERD (gastroesophageal reflux disease)   . Hyperlipidemia   . Hypertension   . Leg edema    Asymetric Right leg (dopplers negative)  . Overweight (BMI 25.0-29.9)   . Stroke (HCC)   . Urination frequency     Family History  Problem Relation Age of Onset  . Hypertension Mother   . Diabetes Father   . Diabetes Sister   . Asthma Brother   . Diabetes Brother   . Kidney disease Brother   . Colon cancer  Neg Hx   . Colon polyps Neg Hx   . Esophageal cancer Neg Hx   . Breast cancer Neg Hx   . Pancreatic cancer Neg Hx   . Stomach cancer Neg Hx   . Rectal cancer Neg Hx     Past Surgical History:  Procedure Laterality Date  . 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 BILATERAL SALPINGOOPHORECTOMY  05/08/2001   Path=extensive endometriosis  . BREAST SURGERY  12/29/2003   Right breast core biopsy=benign fibrocystic change  . SMALL INTESTINE SURGERY  03/30/2001   Rectosigmoid bowel resection and reanastomosis  . UPPER GASTROINTESTINAL ENDOSCOPY     Social History   Occupational History  . Occupation: Disability   Tobacco Use  . Smoking status: Never Smoker  . Smokeless tobacco: Never Used  Vaping Use  . Vaping Use: Never used  Substance and Sexual Activity  . Alcohol use: No    Alcohol/week: 0.0 standard drinks  . Drug use: No  . Sexual activity: Never    Birth control/protection: Abstinence

## 2020-12-07 ENCOUNTER — Encounter: Payer: Self-pay | Admitting: Family Medicine

## 2020-12-07 ENCOUNTER — Other Ambulatory Visit: Payer: Self-pay

## 2020-12-07 ENCOUNTER — Ambulatory Visit (INDEPENDENT_AMBULATORY_CARE_PROVIDER_SITE_OTHER): Payer: Medicare Other | Admitting: Family Medicine

## 2020-12-07 VITALS — BP 126/64 | HR 86 | Ht 61.0 in | Wt 188.1 lb

## 2020-12-07 DIAGNOSIS — Z13820 Encounter for screening for osteoporosis: Secondary | ICD-10-CM

## 2020-12-07 DIAGNOSIS — M1711 Unilateral primary osteoarthritis, right knee: Secondary | ICD-10-CM

## 2020-12-07 DIAGNOSIS — M5417 Radiculopathy, lumbosacral region: Secondary | ICD-10-CM

## 2020-12-07 DIAGNOSIS — L989 Disorder of the skin and subcutaneous tissue, unspecified: Secondary | ICD-10-CM | POA: Diagnosis present

## 2020-12-07 DIAGNOSIS — M25552 Pain in left hip: Secondary | ICD-10-CM

## 2020-12-07 DIAGNOSIS — M79604 Pain in right leg: Secondary | ICD-10-CM

## 2020-12-07 MED ORDER — MUPIROCIN CALCIUM 2 % EX CREA: 1.0000 "application " | TOPICAL_CREAM | Freq: Two times a day (BID) | CUTANEOUS | 0 refills | Status: DC

## 2020-12-07 MED ORDER — TRAMADOL HCL 50 MG PO TABS: 50.0000 mg | ORAL_TABLET | Freq: Three times a day (TID) | ORAL | 0 refills | Status: DC | PRN

## 2020-12-07 MED ORDER — DICLOFENAC SODIUM 1 % EX GEL: 2.0000 g | Freq: Four times a day (QID) | CUTANEOUS | 1 refills | Status: DC

## 2020-12-07 NOTE — Patient Instructions (Addendum)
It was great seeing you today!  As discusssed, stop by the pharmacy to pick up your prescriptions. Continue using topical gel up to four times daily. When you have severe pain take a tablet of Tramadol .  Be [facility] test.    Take care,   [facility]

## 2020-12-07 NOTE — Progress Notes (Addendum)
   SUBJECTIVE:   CHIEF COMPLAINT / HPI:   Chief Complaint  Patient presents with  . Leg Pain    both     Megan Miller is a 65 y.o. female here for "funny pain" in both her legs for the past 2 weeks. Pain also described as burning. Episodes last about 30 minutes then resolve. Ran out of Tramadol and topical gel that helps her pain. Denies leg weakness, swelling, new lower back pain or trauma. Reports pain buttock pain.    PERTINENT  PMH / PSH: reviewed and updated as appropriate   OBJECTIVE:   BP 126/64   Pulse 86   Ht 5\' 1"  (1.549 m)   Wt 188 lb 2 oz (85.3 kg)   SpO2 98%   BMI 35.55 kg/m    GEN: well appearing older female in no acute distress  CVS: well perfused  RESP: speaking in full sentences without pause, no respiratory distress  MSK: slow gait, ROM at baseline, mild tenderness to palpation, no focal tenderness  SKIN: natal cleft vertical abrasions (1 x 0.2 cm; 0.5 x 0.3 cm), no visible sinus, no erythema, no drainage, no fluctuance, no induration, no hair tuft       ASSESSMENT/PLAN:   Lumbosacral radiculopathy Pt with hx of spinal stenosis with claudication. Refilled tramadol for severe pain and diclofenac gel for mild-moderate pain with Tylenol. If not improving consider discontinuing Tramadol and restarting Gabapentin. Red flag sx discussed and non-identified. ED and return precautions given.   Skin lesions cleft with vertical abrasions. No appreciable sinus to indicate pilonidal sinus/abcess. Trial mupirocin ointment. If not improving consider fungal cause, though atypical presentation vs soft tissue injury.       [place], DO PGY-2, [facility] 12/07/2020

## 2020-12-09 DIAGNOSIS — L989 Disorder of the skin and subcutaneous tissue, unspecified: Secondary | ICD-10-CM | POA: Insufficient documentation

## 2020-12-09 NOTE — Assessment & Plan Note (Signed)
Natal cleft with vertical abrasions. No appreciable sinus to indicate pilonidal sinus/abcess. Trial mupirocin ointment. If not improving consider fungal cause, though atypical presentation vs soft tissue injury.

## 2020-12-09 NOTE — Assessment & Plan Note (Signed)
Pt with hx of spinal stenosis with claudication. Refilled tramadol for severe pain and diclofenac gel for mild-moderate pain with Tylenol. If not improving consider discontinuing Tramadol and restarting Gabapentin. Red flag sx discussed and non-identified. ED and return precautions given.

## 2021-02-09 ENCOUNTER — Other Ambulatory Visit: Payer: Self-pay | Admitting: Physician Assistant

## 2021-02-09 DIAGNOSIS — M5417 Radiculopathy, lumbosacral region: Secondary | ICD-10-CM

## 2021-02-16 ENCOUNTER — Telehealth: Payer: Self-pay

## 2021-02-16 NOTE — Telephone Encounter (Signed)
Patient called she is requesting a rx refill for tramadol call [phone]

## 2021-02-17 ENCOUNTER — Ambulatory Visit (INDEPENDENT_AMBULATORY_CARE_PROVIDER_SITE_OTHER): Payer: 59 | Admitting: Family Medicine

## 2021-02-17 ENCOUNTER — Other Ambulatory Visit: Payer: Self-pay

## 2021-02-17 ENCOUNTER — Other Ambulatory Visit: Payer: Self-pay | Admitting: Physician Assistant

## 2021-02-17 VITALS — BP 120/60 | HR 80 | Ht 61.0 in | Wt 187.2 lb

## 2021-02-17 DIAGNOSIS — M62831 Muscle spasm of calf: Secondary | ICD-10-CM | POA: Diagnosis not present

## 2021-02-17 DIAGNOSIS — R0989 Other specified symptoms and signs involving the circulatory and respiratory systems: Secondary | ICD-10-CM

## 2021-02-17 DIAGNOSIS — R198 Other specified symptoms and signs involving the digestive system and abdomen: Secondary | ICD-10-CM

## 2021-02-17 DIAGNOSIS — R252 Cramp and spasm: Secondary | ICD-10-CM

## 2021-02-17 DIAGNOSIS — M5417 Radiculopathy, lumbosacral region: Secondary | ICD-10-CM

## 2021-02-17 MED ORDER — TRAMADOL HCL 50 MG PO TABS: 50.0000 mg | ORAL_TABLET | Freq: Every day | ORAL | 0 refills | Status: DC | PRN

## 2021-02-17 MED ORDER — BACLOFEN 10 MG PO TABS: 10.0000 mg | ORAL_TABLET | Freq: Three times a day (TID) | ORAL | 0 refills | Status: DC

## 2021-02-17 NOTE — Assessment & Plan Note (Signed)
Patient with left lower extremity leg cramping affecting her in the morning, not affecting gait once the area has been warmed up.  Patient has had no falls.  Spasticity could possibly be related to prior stroke, previous checks for electrolytes were within normal limits. - Baclofen 10 mg 3 times daily x10 days - Encourage patient to use heating pad in the morning followed by calf stretches and light exercises.

## 2021-02-17 NOTE — Patient Instructions (Addendum)
For your muscle pain/cramps I recommend getting a heating pad and using that, stretching in the morning and some strengthening exercises for those muscles.  I am can a send in a prescription for medication called baclofen, we will do a short trial of this medication and follow-up with your PCP.  If you start to have the inability to move your leg, decreased weakness or inability to walk well or fear of falls please come back sooner    Muscle Cramps and Spasms Muscle cramps and spasms are when muscles tighten by themselves. They usually get better within minutes. Muscle cramps are painful. They are usually stronger and last longer than muscle spasms. Muscle spasms may or may not be painful.They can last a few seconds or much longer. Cramps and spasms can affect any muscle, but they occur most often in the calf muscles of the leg. They are usually not caused by a serious problem. In many cases, the cause is not known. Some common causes include: Doing more physical work or exercise than your body is ready for. Using the muscles too much (overuse) by repeating certain movements too many times. Staying in a certain position for a long time. Playing a sport or doing an activity without preparing properly. Using bad form or technique while playing a sport or doing an activity. Not having enough water in your body (dehydration). Injury. Side effects of some medicines. Low levels of the salts and minerals in your blood (electrolytes), such as low potassium or calcium. Follow these instructions at home: Managing pain and stiffness     Massage, stretch, and relax the muscle. Do this for many minutes at a time. If told, put heat on tight or tense muscles as often as told by your doctor. Use the heat source that your doctor recommends, such as a moist heat pack or a heating pad. Place a towel between your skin and the heat source. Leave the heat on for 20-30 minutes. Remove the heat if your skin turns  bright red. This is very important if you are not able to feel pain, heat, or cold. You may have a greater risk of getting burned. If told, put ice on the affected area. This may help if you are sore or have pain after a cramp or spasm. Put ice in a plastic bag. Place a towel between your skin and the bag. Leave the ice on for 20 minutes, 2-3 times a day. Try taking hot showers or baths to help relax tight muscles. Eating and drinking Drink enough fluid to keep your pee (urine) pale yellow. Eat a healthy diet to help ensure that your muscles work well. This should include: Fruits and vegetables. Lean protein. Whole grains. Low-fat or nonfat dairy products. General instructions If you are having cramps often, avoid intense exercise for several days. Take over-the-counter and prescription medicines only as told by your doctor. Watch for any changes in your symptoms. Keep all follow-up visits as told by your doctor. This is important. Contact a doctor if: Your cramps or spasms get worse or happen more often. Your cramps or spasms do not get better with time. Summary Muscle cramps and spasms are when muscles tighten by themselves. They usually get better within minutes. Cramps and spasms occur most often in the calf muscles of the leg. Massage, stretch, and relax the muscle. This may help the cramp or spasm go away. Drink enough fluid to keep your pee (urine) pale yellow. This information is not intended to  replace advice given to you by your health care provider. Make sure you discuss any questions you have with your healthcare provider. Document Revised: 12/09/2017 Document Reviewed: 12/09/2017 Elsevier Patient Education  2022 [employer].

## 2021-02-17 NOTE — Telephone Encounter (Signed)
done

## 2021-02-17 NOTE — Progress Notes (Signed)
    SUBJECTIVE:   CHIEF COMPLAINT / HPI:   Left leg cramps A few months of cramping sensation in the back her left leg. Feels that the pain is now more intense. First thing in the morning is when the pain happens. Does not awaken from sleep. After warming up she is able to walk well. Does have a history of stroke on the left side. Hot water in the tub will calm down the muscle spasms.  Globus sensation Feels like there is something sitting in her throat that affects her swallowing. No known history of thyroid issues. Has had this sensation previously.  Had an EGD performed in March of this year.   PERTINENT  PMH / PSH: Reviewed  OBJECTIVE:   BP 120/60   Pulse 80   Ht 5\' 1"  (1.549 m)   Wt 187 lb 4 oz (84.9 kg)   SpO2 99%   BMI 35.38 kg/m   General: NAD, well-appearing, well-nourished HEENT: missing several teeth, neck supple with full ROM, thyroid non-enlarged with no nodules palpated during exam. Respiratory: No respiratory distress, breathing comfortably, able to speak in full sentences Skin: warm and dry, no rashes noted on exposed skin Psych: Appropriate affect and mood Neuro: Decreased muscle mass of the lower left leg, mildly hyperreflexic in the left patellar reflex. All other reflexes 2+ and strength 5/5. Calf non-tender to palpation with no erythema or swelling  ASSESSMENT/PLAN:   Leg cramping Patient with left lower extremity leg cramping affecting her in the morning, not affecting gait once the area has been warmed up.  Patient has had no falls.  Spasticity could possibly be related to prior stroke, previous checks for electrolytes were within normal limits. - Baclofen 10 mg 3 times daily x10 days - Encourage patient to use heating pad in the morning followed by calf stretches and light exercises.  Globus sensation Globus sensation that has been present for an unknown amount of time.  Has a history of an EGD and has felt her throat has been affected since then.  EGD  was completed in March of this year, unlikely to still be causing patient issues at this time, but cannot completely rule this out.  No thyromegaly and no history of thyroid disease.  May be related to patient's GERD, considered tonsillar hypertrophy.  Patient counseled on return precautions and to check back in the next several weeks if worsening or if no improvement.     April, DO [facility]

## 2021-02-17 NOTE — Assessment & Plan Note (Signed)
Globus sensation that has been present for an unknown amount of time.  Has a history of an EGD and has felt her throat has been affected since then.  EGD was completed in March of this year, unlikely to still be causing patient issues at this time, but cannot completely rule this out.  No thyromegaly and no history of thyroid disease.  May be related to patient's GERD, considered tonsillar hypertrophy.  Patient counseled on return precautions and to check back in the next several weeks if worsening or if no improvement.

## 2021-02-22 ENCOUNTER — Other Ambulatory Visit: Payer: Self-pay

## 2021-02-22 DIAGNOSIS — M1711 Unilateral primary osteoarthritis, right knee: Secondary | ICD-10-CM

## 2021-02-22 DIAGNOSIS — M25552 Pain in left hip: Secondary | ICD-10-CM

## 2021-02-22 NOTE — Telephone Encounter (Signed)
Patient calls nurse line requesting refill of acetaminophen. Patient is also requesting alternative medication to bactroban cream, as she broke out when using this medication.   Please advise.   Veronda Prude, RN

## 2021-02-24 MED ORDER — ACETAMINOPHEN ER 650 MG PO TBCR: 650.0000 mg | EXTENDED_RELEASE_TABLET | Freq: Four times a day (QID) | ORAL | 0 refills | Status: AC | PRN

## 2021-03-09 ENCOUNTER — Other Ambulatory Visit: Payer: Self-pay

## 2021-03-09 ENCOUNTER — Ambulatory Visit (INDEPENDENT_AMBULATORY_CARE_PROVIDER_SITE_OTHER): Payer: 59

## 2021-03-09 DIAGNOSIS — Z0000 Encounter for general adult medical examination without abnormal findings: Secondary | ICD-10-CM

## 2021-03-09 NOTE — Progress Notes (Signed)
Call to pt to start AWV - LVM at 5:01pm Call to pt to start AWV - LVM at 5:12pm Call to pt to start AWV - LVM at 5:17pm 4th and final attempt to call pt to start AWV - LVM at 5:25pm stating that the visit would need to be rescheduled for another date.

## 2021-03-09 NOTE — Progress Notes (Deleted)
Subjective:   Megan Miller is a 65 y.o. female who presents for an Initial Medicare Annual Wellness Visit.  Review of Systems    ***       Objective:    There were no vitals filed for this visit. There is no height or weight on file to calculate BMI.  Advanced Directives 02/17/2021 12/07/2020 09/07/2020 08/26/2020 03/18/2020 01/29/2020 10/21/2019  Does Patient Have a Medical Advance Directive? No No No No No No No  Would patient like information on creating a medical advance directive? No - Patient declined No - Patient declined No - Patient declined No - Patient declined No - Patient declined No - Patient declined -    Current Medications (verified) Outpatient Encounter Medications as of 03/09/2021  Medication Sig   acetaminophen (TYLENOL 8 HOUR) 650 MG CR tablet Take 1 tablet (650 mg total) by mouth every 6 (six) hours as needed for pain.   ACIPHEX 20 MG tablet Take 20 mg by mouth daily.   aspirin 81 MG tablet Take 1 tablet (81 mg total) by mouth daily.   atorvastatin (LIPITOR) 40 MG tablet TAKE 1 TABLET BY MOUTH EVERY DAY   baclofen (LIORESAL) 10 MG tablet Take 1 tablet (10 mg total) by mouth 3 (three) times daily.   carvedilol (COREG) 3.125 MG tablet TAKE 1 TABLET BY MOUTH TWICE A DAY WITH A MEAL   cetirizine (ZYRTEC) 10 MG tablet Take 1 tablet (10 mg total) by mouth daily.   desonide (DESOWEN) 0.05 % cream Apply topically 2 (two) times daily.   diclofenac Sodium (VOLTAREN) 1 % GEL Apply 2 g topically 4 (four) times daily.   famotidine (PEPCID) 20 MG tablet Take 1 tablet (20 mg total) by mouth at bedtime. (Patient not taking: Reported on 02/17/2021)   hydrOXYzine (ATARAX/VISTARIL) 10 MG tablet Take 1 tablet (10 mg total) by mouth 3 (three) times daily as needed.   ipratropium (ATROVENT) 0.06 % nasal spray Place 2 sprays into both nostrils 4 (four) times daily.   mupirocin cream (BACTROBAN) 2 % Apply 1 application topically 2 (two) times daily. (Patient not taking: Reported on  02/17/2021)   nitroGLYCERIN (NITRODUR - DOSED IN MG/24 HR) 0.2 mg/hr patch APPLY 1/4 PATCH TO AFFECTED AREA. CHANGE PATCH EVERY 24 HOURS. (Patient not taking: Reported on 02/17/2021)   olopatadine (PATANOL) 0.1 % ophthalmic solution Place 1 drop into both eyes 2 (two) times daily as needed (seasonal allergies/itching).    ondansetron (ZOFRAN ODT) 4 MG disintegrating tablet Take 1 tablet (4 mg total) by mouth every 8 (eight) hours as needed for nausea or vomiting.   pantoprazole (PROTONIX) 40 MG tablet Take 1 tablet (40 mg total) by mouth daily. (Patient not taking: No sig reported)   polyethylene glycol powder (GLYCOLAX/MIRALAX) 17 GM/SCOOP powder Dissolve 17grams in at at least 8 ounces of water or juice daily as needed.   traMADol (ULTRAM) 50 MG tablet Take 1 tablet (50 mg total) by mouth daily as needed.   triamcinolone cream (KENALOG) 0.1 % Apply 1 application topically 2 (two) times daily. (Patient not taking: Reported on 02/17/2021)   No facility-administered encounter medications on file as of 03/09/2021.    Allergies (verified) Percocet [oxycodone-acetaminophen] and Codeine phosphate   History: Past Medical History:  Diagnosis Date   Allergic conjunctivitis    Arthritis    Chest pain    Chronic back pain    Constipation, chronic    Dyspnea on exertion    Dysthymic    GERD (gastroesophageal  reflux disease)    Hyperlipidemia    Hypertension    Leg edema    Asymetric Right leg (dopplers negative)   Overweight (BMI 25.0-29.9)    Stroke [facility])    Urination frequency    Past Surgical History:  Procedure Laterality Date   ABDOMINAL HYSTERECTOMY  09/28/1999   for leimyomas   BILATERAL SALPINGOOPHORECTOMY  05/08/2001   Path=extensive endometriosis   BREAST SURGERY  12/29/2003   Right breast core biopsy=benign fibrocystic change   SMALL INTESTINE SURGERY  03/30/2001   Rectosigmoid bowel resection and reanastomosis   UPPER GASTROINTESTINAL ENDOSCOPY     Family History  Problem  Relation Age of Onset   Hypertension Mother    Diabetes Father    Diabetes Sister    Asthma Brother    Diabetes Brother    Kidney disease Brother    Colon cancer Neg Hx    Colon polyps Neg Hx    Esophageal cancer Neg Hx    Breast cancer Neg Hx    Pancreatic cancer Neg Hx    Stomach cancer Neg Hx    Rectal cancer Neg Hx    Social History   Socioeconomic History   Marital status: Divorced    Spouse name: Not on file   Number of children: 0   Years of education: Not on file   Highest education level: Not on file  Occupational History   Occupation: Disability  Tobacco Use   Smoking status: Never   Smokeless tobacco: Never  Vaping Use   Vaping Use: Never used  Substance and Sexual Activity   Alcohol use: No    Alcohol/week: 0.0 standard drinks   Drug use: No   Sexual activity: Never    Birth control/protection: Abstinence  Other Topics Concern   Not on file  Social History Narrative   Not on file   Social Determinants of Health   Financial Resource Strain: Not on file  Food Insecurity: Not on file  Transportation Needs: Not on file  Physical Activity: Not on file  Stress: Not on file  Social Connections: Not on file    Tobacco Counseling Counseling given: Not Answered   Clinical Intake:                 Diabetic? NO         Activities of Daily Living No flowsheet data found.  Patient Care Team: [name], DO as PCP - General Jake Bathe, MD as PCP - Cardiology (Cardiology)  Indicate any recent Medical Services you may have received from other than [facility] providers in the past year (date may be approximate).     Assessment:   This is a routine wellness examination for Megan Miller.  Hearing/Vision screen No results found.  Dietary issues and exercise activities discussed:     Goals Addressed   None    Depression Screen PHQ 2/9 Scores 02/17/2021 12/07/2020 09/14/2020 09/07/2020 08/26/2020 04/21/2020 03/18/2020  PHQ - 2 Score 2 1 0 0  2 0 0  PHQ- 9 Score 5 2 0 0 7 0 3    Fall Risk Fall Risk  12/07/2020 09/07/2020 08/26/2020 06/21/2020 03/18/2020  Falls in the past year? 1 1 0 0 0  Number falls in past yr: 0 0 0 0 -  Injury with Fall? 1 1 0 0 -  Risk for fall due to : - - - - -  Follow up - - - Falls evaluation completed -    FALL RISK PREVENTION PERTAINING TO  THE HOME:  Any stairs in or around the home? {YES/NO:21197} If so, are there any without handrails? {YES/NO:21197} Home free of loose throw rugs in walkways, pet beds, electrical cords, etc? {YES/NO:21197} Adequate lighting in your home to reduce risk of falls? {YES/NO:21197}  ASSISTIVE DEVICES UTILIZED TO PREVENT FALLS:  Life alert? {YES/NO:21197} Use of a cane, walker or w/c? {YES/NO:21197} Grab bars in the bathroom? {YES/NO:21197} Shower chair or bench in shower? {YES/NO:21197} Elevated toilet seat or a handicapped toilet? {YES/NO:21197}  TIMED UP AND GO:  Was the test performed? {YES/NO:21197}.  Length of time to ambulate 10 feet: *** sec.   {Appearance of [id number]  Cognitive Function:        Immunizations Immunization History  Administered Date(s) Administered   Fluad Quad(high Dose 65+) 09/07/2020   Influenza Split 04/18/2011, 06/19/2012   Influenza Whole 05/27/2009   Influenza,inj,Quad PF,6+ Mos 09/09/2013, 04/22/2014, 09/28/2016, 05/09/2017   PFIZER Comirnaty(Gray Top)Covid-19 Tri-Sucrose Vaccine 08/23/2020   PFIZER(Purple Top)SARS-COV-2 Vaccination 10/08/2019, 10/30/2019   Td 02/27/2002   Tdap 03/18/2013    {TDAP status:2101805}  {Flu Vaccine status:2101806}  {Pneumococcal vaccine status:2101807}  {Covid-19 vaccine status:2101808}  Qualifies for Shingles Vaccine? {YES/NO:21197}  Zostavax completed {YES/NO:21197}  {Shingrix Completed?:2101804}  Screening Tests Health Maintenance  Topic Date Due   Zoster Vaccines- Shingrix (1 of 2) Never done   PNA vac Low Risk Adult (1 of 2 - PCV13) Never done   COVID-19 Vaccine (4  - Booster for Pfizer series) 12/21/2020   INFLUENZA VACCINE  02/27/2021   COLONOSCOPY (Pts 45-58yrs Insurance coverage will need to be confirmed)  08/22/2021   MAMMOGRAM  07/18/2022   TETANUS/TDAP  03/19/2023   DEXA SCAN  Completed   Hepatitis C Screening  Completed   HIV Screening  Completed   HPV VACCINES  Aged Out    Health Maintenance  Health Maintenance Due  Topic Date Due   Zoster Vaccines- Shingrix (1 of 2) Never done   PNA vac Low Risk Adult (1 of 2 - PCV13) Never done   COVID-19 Vaccine (4 - Booster for Pfizer series) 12/21/2020   INFLUENZA VACCINE  02/27/2021    {Colorectal cancer screening:2101809}  {Mammogram status:21018020}  {Bone Density status:21018021}  Lung Cancer Screening: (Low Dose CT Chest recommended if Age 6-80 years, 30 pack-year currently smoking OR have quit w/in 15years.) {DOES NOT does:27190::"does not"} qualify.   Lung Cancer Screening Referral: ***  Additional Screening:  Hepatitis C Screening: {DOES NOT does:27190::"does not"} qualify; Completed ***  Vision Screening: Recommended annual ophthalmology exams for early detection of glaucoma and other disorders of the eye. Is the patient up to date with their annual eye exam?  {YES/NO:21197} Who is the provider or what is the name of the office in which the patient attends annual eye exams? *** If pt is not established with a provider, would they like to be referred to a provider to establish care? {YES/NO:21197}.   Dental Screening: Recommended annual dental exams for proper oral hygiene  Community Resource Referral / Chronic Care Management: CRR required this visit?  {YES/NO:21197}  CCM required this visit?  {YES/NO:21197}     Plan:     I have personally reviewed and noted the following in the patient's chart:   Medical and social history Use of alcohol, tobacco or illicit drugs  Current medications and supplements including opioid prescriptions. {Opioid  Prescriptions:703 516 [id number]} Functional ability and status Nutritional status Physical activity Advanced directives List of other physicians Hospitalizations, surgeries, and ER visits in previous 12 months Vitals Screenings to  include cognitive, depression, and falls Referrals and appointments  In addition, I have reviewed and discussed with patient certain preventive protocols, quality metrics, and best practice recommendations. A written personalized care plan for preventive services as well as general preventive health recommendations were provided to patient.     Dennis Bast, CMA   03/09/2021   Nurse Notes: ***

## 2021-03-16 ENCOUNTER — Ambulatory Visit: Payer: Medicare Other | Admitting: Physician Assistant

## 2021-03-17 ENCOUNTER — Ambulatory Visit (INDEPENDENT_AMBULATORY_CARE_PROVIDER_SITE_OTHER): Payer: 59

## 2021-03-17 ENCOUNTER — Telehealth: Payer: Self-pay

## 2021-03-17 DIAGNOSIS — Z0000 Encounter for general adult medical examination without abnormal findings: Secondary | ICD-10-CM

## 2021-03-17 MED ORDER — SHINGRIX 50 MCG/0.5ML IM SUSR: 0.5000 mL | Freq: Once | INTRAMUSCULAR | 1 refills | Status: AC

## 2021-03-17 NOTE — Progress Notes (Addendum)
Subjective:   Megan Miller is a 65 y.o. female who presents for an Initial Medicare Annual Wellness Visit.  I connected with  Suellyn L Miller on 03/17/21 by audio enabled telemedicine application and verified that I am speaking with the correct person using two identifiers.   I discussed the limitations of evaluation and management by telemedicine. The patient expressed understanding and agreed to proceed.  Location of patient: Home Location of provider: Office   Review of Systems     Defer to PCP       Objective:    There were no vitals filed for this visit. There is no height or weight on file to calculate BMI.  Advanced Directives 02/17/2021 12/07/2020 09/07/2020 08/26/2020 03/18/2020 01/29/2020 10/21/2019  Does Patient Have a Medical Advance Directive? No No No No No No No  Would patient like information on creating a medical advance directive? No - Patient declined No - Patient declined No - Patient declined No - Patient declined No - Patient declined No - Patient declined -    Current Medications (verified) Outpatient Encounter Medications as of 03/17/2021  Medication Sig   acetaminophen (TYLENOL 8 HOUR) 650 MG CR tablet Take 1 tablet (650 mg total) by mouth every 6 (six) hours as needed for pain.   ACIPHEX 20 MG tablet Take 20 mg by mouth daily.   aspirin 81 MG tablet Take 1 tablet (81 mg total) by mouth daily.   atorvastatin (LIPITOR) 40 MG tablet TAKE 1 TABLET BY MOUTH EVERY DAY   baclofen (LIORESAL) 10 MG tablet Take 1 tablet (10 mg total) by mouth 3 (three) times daily.   carvedilol (COREG) 3.125 MG tablet TAKE 1 TABLET BY MOUTH TWICE A DAY WITH A MEAL   cetirizine (ZYRTEC) 10 MG tablet Take 1 tablet (10 mg total) by mouth daily.   desonide (DESOWEN) 0.05 % cream Apply topically 2 (two) times daily.   diclofenac Sodium (VOLTAREN) 1 % GEL Apply 2 g topically 4 (four) times daily.   famotidine (PEPCID) 20 MG tablet Take 1 tablet (20 mg total) by mouth at bedtime.  (Patient not taking: Reported on 02/17/2021)   hydrOXYzine (ATARAX/VISTARIL) 10 MG tablet Take 1 tablet (10 mg total) by mouth 3 (three) times daily as needed.   ipratropium (ATROVENT) 0.06 % nasal spray Place 2 sprays into both nostrils 4 (four) times daily.   mupirocin cream (BACTROBAN) 2 % Apply 1 application topically 2 (two) times daily. (Patient not taking: Reported on 02/17/2021)   nitroGLYCERIN (NITRODUR - DOSED IN MG/24 HR) 0.2 mg/hr patch APPLY 1/4 PATCH TO AFFECTED AREA. CHANGE PATCH EVERY 24 HOURS. (Patient not taking: Reported on 02/17/2021)   olopatadine (PATANOL) 0.1 % ophthalmic solution Place 1 drop into both eyes 2 (two) times daily as needed (seasonal allergies/itching).    ondansetron (ZOFRAN ODT) 4 MG disintegrating tablet Take 1 tablet (4 mg total) by mouth every 8 (eight) hours as needed for nausea or vomiting.   pantoprazole (PROTONIX) 40 MG tablet Take 1 tablet (40 mg total) by mouth daily. (Patient not taking: No sig reported)   polyethylene glycol powder (GLYCOLAX/MIRALAX) 17 GM/SCOOP powder Dissolve 17grams in at at least 8 ounces of water or juice daily as needed.   traMADol (ULTRAM) 50 MG tablet Take 1 tablet (50 mg total) by mouth daily as needed.   triamcinolone cream (KENALOG) 0.1 % Apply 1 application topically 2 (two) times daily. (Patient not taking: Reported on 02/17/2021)   No facility-administered encounter medications on file  as of 03/17/2021.    Allergies (verified) Percocet [oxycodone-acetaminophen] and Codeine phosphate   History: Past Medical History:  Diagnosis Date   Allergic conjunctivitis    Arthritis    Chest pain    Chronic back pain    Constipation, chronic    Dyspnea on exertion    Dysthymic    GERD (gastroesophageal reflux disease)    Hyperlipidemia    Hypertension    Leg edema    Asymetric Right leg (dopplers negative)   Overweight (BMI 25.0-29.9)    Stroke [facility])    Urination frequency    Past Surgical History:  Procedure  Laterality Date   ABDOMINAL HYSTERECTOMY  09/28/1999   for leimyomas   BILATERAL SALPINGOOPHORECTOMY  05/08/2001   Path=extensive endometriosis   BREAST SURGERY  12/29/2003   Right breast core biopsy=benign fibrocystic change   SMALL INTESTINE SURGERY  03/30/2001   Rectosigmoid bowel resection and reanastomosis   UPPER GASTROINTESTINAL ENDOSCOPY     Family History  Problem Relation Age of Onset   Hypertension Mother    Diabetes Father    Diabetes Sister    Asthma Brother    Diabetes Brother    Kidney disease Brother    Colon cancer Neg Hx    Colon polyps Neg Hx    Esophageal cancer Neg Hx    Breast cancer Neg Hx    Pancreatic cancer Neg Hx    Stomach cancer Neg Hx    Rectal cancer Neg Hx    Social History   Socioeconomic History   Marital status: Divorced    Spouse name: Not on file   Number of children: 0   Years of education: Not on file   Highest education level: Not on file  Occupational History   Occupation: Disability  Tobacco Use   Smoking status: Never   Smokeless tobacco: Never  Vaping Use   Vaping Use: Never used  Substance and Sexual Activity   Alcohol use: No    Alcohol/week: 0.0 standard drinks   Drug use: No   Sexual activity: Never    Birth control/protection: Abstinence  Other Topics Concern   Not on file  Social History Narrative   Not on file   Social Determinants of Health   Financial Resource Strain: Not on file  Food Insecurity: Not on file  Transportation Needs: Not on file  Physical Activity: Not on file  Stress: Not on file  Social Connections: Not on file    Tobacco Counseling Counseling given: Not Answered   Clinical Intake:                 Diabetic?No         Activities of Daily Living No flowsheet data found.  Patient Care Team: [name], DO as PCP - General Jake Bathe, MD as PCP - Cardiology (Cardiology)  Indicate any recent Medical Services you may have received from other than [facility]  providers in the past year (date may be approximate).     Assessment:   This is a routine wellness examination for Megan Miller.  Hearing/Vision screen No results found.  Dietary issues and exercise activities discussed:     Goals Addressed   None   Depression Screen PHQ 2/9 Scores 02/17/2021 12/07/2020 09/14/2020 09/07/2020 08/26/2020 04/21/2020 03/18/2020  PHQ - 2 Score 2 1 0 0 2 0 0  PHQ- 9 Score 5 2 0 0 7 0 3    Fall Risk Fall Risk  12/07/2020 09/07/2020 08/26/2020 06/21/2020 03/18/2020  Falls  in the past year? 1 1 0 0 0  Number falls in past yr: 0 0 0 0 -  Injury with Fall? 1 1 0 0 -  Risk for fall due to : - - - - -  Follow up - - - Falls evaluation completed -    FALL RISK PREVENTION PERTAINING TO THE HOME:  Any stairs in or around the home? Yes  If so, are there any without handrails? Yes  Home free of loose throw rugs in walkways, pet beds, electrical cords, etc? No  Adequate lighting in your home to reduce risk of falls? Yes   ASSISTIVE DEVICES UTILIZED TO PREVENT FALLS:  Life alert? No  Use of a cane, walker or w/c? No  Grab bars in the bathroom? No  Shower chair or bench in shower? No  Elevated toilet seat or a handicapped toilet? No   TIMED UP AND GO:  Was the test performed?  N/A .  Length of time to ambulate 10 feet: N/A sec.   Per patient statement: Gait slow and steady with assistive device  Cognitive Function:        Immunizations Immunization History  Administered Date(s) Administered   Fluad Quad(high Dose 65+) 09/07/2020   Influenza Split 04/18/2011, 06/19/2012   Influenza Whole 05/27/2009   Influenza,inj,Quad PF,6+ Mos 09/09/2013, 04/22/2014, 09/28/2016, 05/09/2017   PFIZER Comirnaty(Gray Top)Covid-19 Tri-Sucrose Vaccine 08/23/2020   PFIZER(Purple Top)SARS-COV-2 Vaccination 10/08/2019, 10/30/2019   Td 02/27/2002   Tdap 03/18/2013    TDAP status: Up to date  Flu Vaccine status: Due, Education has been provided regarding the importance of this  vaccine. Advised may receive this vaccine at local pharmacy or Health Dept. Aware to provide a copy of the vaccination record if obtained from local pharmacy or Health Dept. Verbalized acceptance and understanding.  Pneumococcal vaccine status: Due, Education has been provided regarding the importance of this vaccine. Advised may receive this vaccine at local pharmacy or Health Dept. Aware to provide a copy of the vaccination record if obtained from local pharmacy or Health Dept. Verbalized acceptance and understanding.  Covid-19 vaccine status: Information provided on how to obtain vaccines.   Qualifies for Shingles Vaccine? Yes   Zostavax completed No   Shingrix Completed?: No.    Education has been provided regarding the importance of this vaccine. Patient has been advised to call insurance company to determine out of pocket expense if they have not yet received this vaccine. Advised may also receive vaccine at local pharmacy or Health Dept. Verbalized acceptance and understanding.  Screening Tests Health Maintenance  Topic Date Due   Zoster Vaccines- Shingrix (1 of 2) Never done   PNA vac Low Risk Adult (1 of 2 - PCV13) Never done   COVID-19 Vaccine (4 - Booster for Pfizer series) 12/21/2020   INFLUENZA VACCINE  02/27/2021   COLONOSCOPY (Pts 45-57yrs Insurance coverage will need to be confirmed)  08/22/2021   MAMMOGRAM  07/18/2022   TETANUS/TDAP  03/19/2023   DEXA SCAN  Completed   Hepatitis C Screening  Completed   HPV VACCINES  Aged Out    Health Maintenance  Health Maintenance Due  Topic Date Due   Zoster Vaccines- Shingrix (1 of 2) Never done   PNA vac Low Risk Adult (1 of 2 - PCV13) Never done   COVID-19 Vaccine (4 - Booster for Pfizer series) 12/21/2020   INFLUENZA VACCINE  02/27/2021    Colorectal cancer screening: Type of screening: Colonoscopy. Completed 08/23/2011. Repeat every 10 years  Mammogram status: Completed 07/18/2020. Repeat every year  Bone Density  status: Ordered 12/07/2020. Pt provided with contact info and advised to call to schedule appt.  Lung Cancer Screening: (Low Dose CT Chest recommended if Age 66-80 years, 30 pack-year currently smoking OR have quit w/in 15years.) does not qualify.   Lung Cancer Screening Referral: N/A  Additional Screening:  Hepatitis C Screening: does qualify; Completed 12/23/2020  Vision Screening: Recommended annual ophthalmology exams for early detection of glaucoma and other disorders of the eye. Is the patient up to date with their annual eye exam?  Yes  Who is the provider or what is the name of the office in which the patient attends annual eye exams? [facility] If pt is not established with a provider, would they like to be referred to a provider to establish care?  N/A .   Dental Screening: Recommended annual dental exams for proper oral hygiene  Community Resource Referral / Chronic Care Management: CRR required this visit?  No   CCM required this visit?  Yes      Plan:     I have personally reviewed and noted the following in the patient's chart:   Medical and social history Use of alcohol, tobacco or illicit drugs  Current medications and supplements including opioid prescriptions. Patient is not currently taking opioid prescriptions. Functional ability and status Nutritional status Physical activity Advanced directives List of other physicians Hospitalizations, surgeries, and ER visits in previous 12 months Vitals Screenings to include cognitive, depression, and falls Referrals and appointments  In addition, I have reviewed and discussed with patient certain preventive protocols, quality metrics, and best practice recommendations. A written personalized care plan for preventive services as well as general preventive health recommendations were provided to patient.     Dennis BastStefannie C Davidson, CMA   03/17/2021   Nurse Notes:   Patient and I discussed ACP and the  importance of this planning. Patient agrees to learn more about it and would like a visit with PCP to discuss the further and to determine the next steps to complete this. I have sent a message to the scheduling staff to have patient come in for a F2F visit with PCP.     Ms. Ephriam KnucklesChristian , Thank you for taking time to come for your Medicare Wellness Visit. I appreciate your ongoing commitment to your health goals. Please review the following plan we discussed and let me know if I can assist you in the future.   These are the goals we discussed:  Goals      Keep or Improve My Strength-Stroke     Timeframe:  Long-Range Goal Priority:  High Start Date:     03/28/2021                        Expected End Date:   On going     Follow Up Date 03/17/2021    - arrange in-home help services - increase activity or exercise time a little every week - join an activity or exercise group in community    Why is this important?   Before the stroke you probably did not think much about being safe when you are up and about.  Now, it may be harder for you to get around.  It may also be easier for you to trip or fall.  It is common to have muscle weakness after a stroke. You may also feel like you cannot control an arm  or leg.  It will be helpful to work with a physical therapist to get your strength and muscle control back.  It is good to stay as active as you can. Walking and stretching help you stay strong and flexible.  The physical therapist will develop an exercise program just for you.     Notes: Patient states that she would like to be more active.         This is a list of the screening recommended for you and due dates:  Health Maintenance  Topic Date Due   Zoster (Shingles) Vaccine (1 of 2) Never done   Pneumonia vaccines (1 of 2 - PCV13) Never done   COVID-19 Vaccine (4 - Booster for Pfizer series) 12/21/2020   Flu Shot  02/27/2021   Colon Cancer Screening  08/22/2021   Mammogram   07/18/2022   Tetanus Vaccine  03/19/2023   DEXA scan (bone density measurement)  Completed   Hepatitis C Screening: USPSTF Recommendation to screen - Ages 18-79 yo.  Completed   HPV Vaccine  Aged Out

## 2021-03-17 NOTE — Telephone Encounter (Signed)
Completed AWV with patient and she would benefit from a visit with a provider for the following:    PCS - form.  Housing assistance - the home she is living in now is 'unlivable" per patient per section 8 inspection. She is not sure where to go and needs assistance to find a home that accepts section 8. Patient stated that landlord is evicting her because of this.   Interested in becoming more active and is requesting a referral for services that can help her become more active.   Patient is feeling stressed and troubled - mostly due to the housing situation that she is in.   Patient agrees to learn more about it and would like a visit with PCP to discuss the further and to determine the next steps to complete this.

## 2021-03-17 NOTE — Patient Instructions (Signed)
  Megan Miller , Thank you for taking time to come for your Medicare Wellness Visit. I appreciate your ongoing commitment to your health goals. Please review the following plan we discussed and let me know if I can assist you in the future.   These are the goals we discussed:  Goals      Keep or Improve My Strength-Stroke     Timeframe:  Long-Range Goal Priority:  High Start Date:     03/28/2021                        Expected End Date:   On going     Follow Up Date 03/17/2021    - arrange in-home help services - increase activity or exercise time a little every week - join an activity or exercise group in community    Why is this important?   Before the stroke you probably did not think much about being safe when you are up and about.  Now, it may be harder for you to get around.  It may also be easier for you to trip or fall.  It is common to have muscle weakness after a stroke. You may also feel like you cannot control an arm or leg.  It will be helpful to work with a physical therapist to get your strength and muscle control back.  It is good to stay as active as you can. Walking and stretching help you stay strong and flexible.  The physical therapist will develop an exercise program just for you.     Notes: Patient states that she would like to be more active.         This is a list of the screening recommended for you and due dates:  Health Maintenance  Topic Date Due   Zoster (Shingles) Vaccine (1 of 2) Never done   Pneumonia vaccines (1 of 2 - PCV13) Never done   COVID-19 Vaccine (4 - Booster for Pfizer series) 12/21/2020   Flu Shot  02/27/2021   Colon Cancer Screening  08/22/2021   Mammogram  07/18/2022   Tetanus Vaccine  03/19/2023   DEXA scan (bone density measurement)  Completed   Hepatitis C Screening: USPSTF Recommendation to screen - Ages 18-79 yo.  Completed   HPV Vaccine  Aged Out

## 2021-03-20 ENCOUNTER — Ambulatory Visit: Payer: 59 | Admitting: Family Medicine

## 2021-03-23 ENCOUNTER — Ambulatory Visit (INDEPENDENT_AMBULATORY_CARE_PROVIDER_SITE_OTHER): Payer: 59 | Admitting: Physician Assistant

## 2021-03-23 ENCOUNTER — Encounter: Payer: Self-pay | Admitting: Physician Assistant

## 2021-03-23 VITALS — BP 140/76 | HR 90 | Ht 61.0 in | Wt 189.0 lb

## 2021-03-23 DIAGNOSIS — R0989 Other specified symptoms and signs involving the circulatory and respiratory systems: Secondary | ICD-10-CM

## 2021-03-23 DIAGNOSIS — R1319 Other dysphagia: Secondary | ICD-10-CM | POA: Diagnosis not present

## 2021-03-23 DIAGNOSIS — K219 Gastro-esophageal reflux disease without esophagitis: Secondary | ICD-10-CM | POA: Diagnosis not present

## 2021-03-23 MED ORDER — ACIPHEX 20 MG PO TBEC: 20.0000 mg | DELAYED_RELEASE_TABLET | Freq: Two times a day (BID) | ORAL | 11 refills | Status: DC

## 2021-03-23 MED ORDER — FAMOTIDINE 40 MG PO TABS
40.0000 mg | ORAL_TABLET | Freq: Every day | ORAL | 6 refills | Status: DC
Start: 2021-03-23 — End: 2021-04-27

## 2021-03-23 NOTE — Progress Notes (Signed)
Subjective:    Patient ID: Megan Miller, female    DOB: 02/09/1956, 65 y.o.   MRN: [id number]  HPI  Megan Miller is a pleasant 65 year old African-American female, established with Dr. Rhea Miller who comes in today for 66-month follow-up after EGD.  She had been seen in February 2022 with complaints of chronic GERD, frequent dysphagia to solids and sensation of fullness in her throat. She had EGD in March 2022 which showed a ringed esophagus with feline appearance in the middle and lower third of the esophagus, there was a distal stricture present which was dilated to 49 [place] and noted to have a small 1 to 2 cm hiatal hernia.  By appearance of the esophagus there was concern for eosinophilic esophagitis however biopsies were done and were negative for any increase in intraepithelial eosinophils. Patient had prior colonoscopy done in 2013 per Dr. Kym Miller with finding of internal and external hemorrhoids a single diverticulum and no polyps. Patient has history of prior CVA, hypertension, migraines, spinal stenosis, hepatic steatosis.  She has been on Aciphex 20 mg daily and famotidine 20 mg nightly.  She did not have any changes in medications after the EGD.  She does feel that the dilation helped her for a couple of months somewhat but symptoms have recurred and she is having persistent sensation of fullness in the throat as she was having previously and a sensation of food "sitting" in the esophagus and being slow to traverse.  She also has been having ongoing acid reflux type symptoms and heartburn.  No regurgitation.  No difficulty with liquids.  No current complaints of abdominal discomfort.  Review of Systems Pertinent positive and negative review of systems were noted in the above HPI section.  All other review of systems was otherwise negative.   Outpatient Encounter Medications as of 03/23/2021  Medication Sig   acetaminophen (TYLENOL 8 HOUR) 650 MG CR tablet Take 1 tablet (650 mg total) by  mouth every 6 (six) hours as needed for pain.   aspirin 81 MG tablet Take 1 tablet (81 mg total) by mouth daily.   atorvastatin (LIPITOR) 40 MG tablet TAKE 1 TABLET BY MOUTH EVERY DAY   baclofen (LIORESAL) 10 MG tablet Take 1 tablet (10 mg total) by mouth 3 (three) times daily.   carvedilol (COREG) 3.125 MG tablet TAKE 1 TABLET BY MOUTH TWICE A DAY WITH A MEAL   cetirizine (ZYRTEC) 10 MG tablet Take 1 tablet (10 mg total) by mouth daily.   desonide (DESOWEN) 0.05 % cream Apply topically 2 (two) times daily.   diclofenac Sodium (VOLTAREN) 1 % GEL Apply 2 g topically 4 (four) times daily.   Emollient (EUCERIN) lotion Apply topically as needed for dry skin.   hydrOXYzine (ATARAX/VISTARIL) 10 MG tablet Take 1 tablet (10 mg total) by mouth 3 (three) times daily as needed.   mupirocin cream (BACTROBAN) 2 % Apply 1 application topically 2 (two) times daily.   nitroGLYCERIN (NITRODUR - DOSED IN MG/24 HR) 0.2 mg/hr patch APPLY 1/4 PATCH TO AFFECTED AREA. CHANGE PATCH EVERY 24 HOURS.   olopatadine (PATANOL) 0.1 % ophthalmic solution Place 1 drop into both eyes 2 (two) times daily as needed (seasonal allergies/itching).    ondansetron (ZOFRAN ODT) 4 MG disintegrating tablet Take 1 tablet (4 mg total) by mouth every 8 (eight) hours as needed for nausea or vomiting.   pantoprazole (PROTONIX) 40 MG tablet Take 1 tablet (40 mg total) by mouth daily.   polyethylene glycol powder (GLYCOLAX/MIRALAX) 17  GM/SCOOP powder Dissolve 17grams in at at least 8 ounces of water or juice daily as needed.   Skin Protectants, Misc. (EUCERIN) cream Apply topically as needed for dry skin.   traMADol (ULTRAM) 50 MG tablet Take 1 tablet (50 mg total) by mouth daily as needed.   triamcinolone cream (KENALOG) 0.1 % Apply 1 application topically 2 (two) times daily.   [DISCONTINUED] ACIPHEX 20 MG tablet Take 20 mg by mouth daily.   [DISCONTINUED] famotidine (PEPCID) 20 MG tablet Take 1 tablet (20 mg total) by mouth at bedtime.    ACIPHEX 20 MG tablet Take 1 tablet (20 mg total) by mouth in the morning and at bedtime.   famotidine (PEPCID) 40 MG tablet Take 1 tablet (40 mg total) by mouth at bedtime.   ipratropium (ATROVENT) 0.06 % nasal spray Place 2 sprays into both nostrils 4 (four) times daily.   No facility-administered encounter medications on file as of 03/23/2021.   Allergies  Allergen Reactions   Percocet [Oxycodone-Acetaminophen] Nausea And Vomiting   Codeine Phosphate Itching   Patient Active Problem List   Diagnosis Date Noted   Globus sensation 02/17/2021   Skin lesions 12/09/2020   Leg cramping 01/29/2020   Vestibular neuritis 09/25/2019   Primary osteoarthritis of right knee 07/09/2018   Chronic migraine without aura without status migrainosus, not intractable 11/13/2017   Lumbosacral radiculopathy 11/12/2017   Cerebrovascular accident (CVA) (HCC) 11/08/2017   Spinal stenosis of lumbar region 11/08/2017   Routine adult health maintenance 02/13/2016   Stable angina (HCC) 09/23/2014   Depression 03/18/2013   MYOPIA 08/25/2009   CONSTIPATION, CHRONIC 02/10/2009   GERD 02/06/2008   HYPERTENSION, BENIGN 10/09/2006   Chronic low back pain 10/09/2006   Social History   Socioeconomic History   Marital status: Divorced    Spouse name: Not on file   Number of children: 0   Years of education: 12   Highest education level: 12th grade  Occupational History   Occupation: Disability  Tobacco Use   Smoking status: Never   Smokeless tobacco: Never  Vaping Use   Vaping Use: Never used  Substance and Sexual Activity   Alcohol use: Yes    Comment: 1 drink very rarely   Drug use: No   Sexual activity: Not Currently    Birth control/protection: Abstinence  Other Topics Concern   Not on file  Social History Narrative   Patient lives alone. Patient does not have any animals.    Social Determinants of Health   Financial Resource Strain: Low Risk    Difficulty of Paying Living Expenses: Not  hard at all  Food Insecurity: No Food Insecurity   Worried About Programme researcher, broadcasting/film/video in the Last Year: Never true   Ran Out of Food in the Last Year: Never true  Transportation Needs: No Transportation Needs   Lack of Transportation (Medical): No   Lack of Transportation (Non-Medical): No  Physical Activity: Inactive   Days of Exercise per Week: 0 days   Minutes of Exercise per Session: 0 min  Stress: Stress Concern Present   Feeling of Stress : To some extent  Social Connections: Moderately Isolated   Frequency of Communication with Friends and Family: More than three times a week   Frequency of Social Gatherings with Friends and Family: Three times a week   Attends Religious Services: More than 4 times per year   Active Member of Clubs or Organizations: No   Attends Banker Meetings: Never  Marital Status: Divorced  Catering manager Violence: Not At Risk   Fear of Current or Ex-Partner: No   Emotionally Abused: No   Physically Abused: No   Sexually Abused: No    Megan Miller's family history includes Asthma in her brother and brother; Diabetes in her brother, brother, father, and sister; Hypertension in her mother; Kidney disease in her brother.      Objective:    Vitals:   03/23/21 1110  BP: 140/76  Pulse: 90    Physical Exam Well-developed well-nourished AA female  in no acute distress.   Weight 189, BMI 35.7  HEENT; nontraumatic normocephalic, EOMI, PE R LA, sclera anicteric. Oropharynx;not done Neck; supple, no JVD Cardiovascular; regular rate and rhythm with S1-S2, no murmur rub or gallop Pulmonary; Clear bilaterally Abdomen; soft, nontender, nondistended, no palpable mass or hepatosplenomegaly, bowel sounds are active Rectal;not done Skin; benign exam, no jaundice rash or appreciable lesions Extremities; no clubbing cyanosis or edema skin warm and dry Neuro/Psych; alert and oriented x4, grossly nonfocal mood and affect appropriate         Assessment & Plan:   #52 65 year old African-American female with chronic GERD, history of distal stricture status post [facility] dilation March 2022, and ringed appearance of the esophagus with feline appearance concerning for eosinophilic esophagitis, however biopsies were negative.  On follow-up today she is having persistence of symptoms though does feel that the dilation helped her dysphagia symptoms somewhat.  She continues to have heartburn and indigestion and a persistent sensation of fullness, and sense of food sitting in the esophagus.  #2 colon cancer surveillance-negative colonoscopy 2013 will be indicated for follow-up in January 2023 #3 prior history of CVA #4.  History of migraines #5.  History of spinal stenosis #6  hepatic steatosis ultrasound April 2021 showing a mildly echogenic liver similar to 2016, no cirrhosis.  Plan; will increase Aciphex to 20 mg p.o. twice daily AC breakfast and AC dinner Change famotidine to 40 mg nightly, prescription sent We discussed a strict antireflux regimen including n.p.o. for 3 hours prior to bedtime and elevation of her back for sleep at least 45 degrees. Will schedule for barium swallow with a tablet Further recommendations pending results of barium swallow and response to increasing acid suppression. Plan;   Sammuel Cooper PA-C 03/23/2021   Cc: [name], DO

## 2021-03-23 NOTE — Patient Instructions (Signed)
If you are age 66 or older, your body mass index should be between 23-30. Your Body mass index is 35.71 kg/m. If this is out of the aforementioned range listed, please consider follow up with your Primary Care Provider. __________________________________________________________  The [facility] providers would like to encourage you to use [facility] to communicate with providers for non-urgent requests or questions.  Due to long hold times on the telephone, sending your provider a message by [facility] may be a faster and more efficient way to get a response.  Please allow 48 business hours for a response.  Please remember that this is for non-urgent requests.   You have been scheduled for a Barium Esophogram at [facility] (1st floor of the hospital) on 04/04/2021 at 9:30 am. Please arrive 15 minutes prior to your appointment for registration. Make certain not to have anything to eat or drink 3 hours prior to your test. If you need to reschedule for any reason, please contact radiology at [phone] to do so. __________________________________________________________________ A barium swallow is an examination that concentrates on views of the esophagus. This tends to be a double contrast exam (barium and two liquids which, when combined, create a gas to distend the wall of the oesophagus) or single contrast (non-ionic iodine based). The study is usually tailored to your symptoms so a good history is essential. Attention is paid during the study to the form, structure and configuration of the esophagus, looking for functional disorders (such as aspiration, dysphagia, achalasia, motility and reflux) EXAMINATION You may be asked to change into a gown, depending on the type of swallow being performed. A radiologist and radiographer will perform the procedure. The radiologist will advise you of the type of contrast selected for your procedure and direct you during the exam. You will be asked to stand, sit  or lie in several different positions and to hold a small amount of fluid in your mouth before being asked to swallow while the imaging is performed .In some instances you may be asked to swallow barium coated marshmallows to assess the motility of a solid food bolus. The exam can be recorded as a digital or video fluoroscopy procedure. POST PROCEDURE It will take 1-2 days for the barium to pass through your system. To facilitate this, it is important, unless otherwise directed, to increase your fluids for the next 24-48hrs and to resume your normal diet.  This test typically takes about 30 minutes to perform. __________________________________________________________________________________  Increase your Aciphex 20 mg to twice daily Increase Famotidine to 40 mg daily  Follow an Anti-reflux diet.  Follow up pending the results of your Barium Swallow.  Thank you for entrusting me with your care and choosing [facility].  Amy Esterwood, PA-C

## 2021-03-23 NOTE — Progress Notes (Signed)
Addendum: Reviewed and agree with assessment and management plan. Evy Lutterman M, MD  

## 2021-04-04 ENCOUNTER — Ambulatory Visit (HOSPITAL_COMMUNITY): Payer: 59

## 2021-04-10 ENCOUNTER — Ambulatory Visit (HOSPITAL_COMMUNITY): Admission: RE | Admit: 2021-04-10 | Payer: 59 | Source: Ambulatory Visit

## 2021-04-14 ENCOUNTER — Ambulatory Visit (HOSPITAL_COMMUNITY)
Admission: RE | Admit: 2021-04-14 | Discharge: 2021-04-14 | Disposition: A | Discharge status: Home / Self Care | Payer: 59 | Source: Ambulatory Visit | Attending: Physician Assistant | Admitting: Physician Assistant

## 2021-04-14 ENCOUNTER — Other Ambulatory Visit: Payer: Self-pay

## 2021-04-14 DIAGNOSIS — R0989 Other specified symptoms and signs involving the circulatory and respiratory systems: Secondary | ICD-10-CM | POA: Diagnosis present

## 2021-04-14 DIAGNOSIS — K219 Gastro-esophageal reflux disease without esophagitis: Secondary | ICD-10-CM | POA: Diagnosis present

## 2021-04-14 DIAGNOSIS — R1319 Other dysphagia: Secondary | ICD-10-CM | POA: Diagnosis present

## 2021-04-27 ENCOUNTER — Telehealth: Payer: Self-pay | Admitting: Physician Assistant

## 2021-04-27 ENCOUNTER — Other Ambulatory Visit: Payer: Self-pay

## 2021-04-27 MED ORDER — FAMOTIDINE 40 MG PO TABS: 40.0000 mg | ORAL_TABLET | Freq: Every day | ORAL | 6 refills | Status: DC

## 2021-04-27 NOTE — Telephone Encounter (Signed)
Spoke with the patient. She has had minimal improvement of her symptoms. Reviewed the imaging results. Reviewed her medications. Patient does not recognized Famotidine. Rx transmitted to her pharmacy. She will make sure she is taking both Aciphex and Famotidine as directed.

## 2021-04-27 NOTE — Telephone Encounter (Signed)
Inbound call from pt requesting a call back stated that she was returning a phone call to [place], However Bonita Quin advise me to send to you. Pt is calling regarding results. Please advise. Thank you.

## 2021-05-02 ENCOUNTER — Telehealth: Payer: Self-pay | Admitting: Physician Assistant

## 2021-05-02 NOTE — Telephone Encounter (Signed)
Inbound call from patient. States she called her pharmacy and they do not have a prescription for Pepcid. Pharmacy is [facility] on [address]

## 2021-05-02 NOTE — Telephone Encounter (Signed)
Called patient's pharmacy they advised that the patient is trying to pick up her Famotidine too soon. She had picked it up along with Aciphex and Pantoprazole on 04/07/2021. I reviewed her chart and it looks like we stopped her Aciphex and replaced it with Pantoprazole on 08/31/2020. Then it looks like we increased her Aciphex on 03/23/2021 without advising her to stop the Pantoprazole. She is going to look to see if she has picked up the Famotidine.

## 2021-05-03 NOTE — Telephone Encounter (Signed)
Left voicemail for patient to call back to verify the medications that she picked up from the pharmacy.

## 2021-05-04 NOTE — Telephone Encounter (Signed)
Tried to call patient, no answer. Will send My Chart message to patient in order to follow up.

## 2021-05-15 ENCOUNTER — Other Ambulatory Visit: Payer: Self-pay | Admitting: Physician Assistant

## 2021-05-16 NOTE — Telephone Encounter (Signed)
Prior Authorization has been started 

## 2021-05-18 NOTE — Telephone Encounter (Signed)
[id number]. ACIPHEX TAB 20MG  is approved through 07/29/2022

## 2021-06-01 ENCOUNTER — Ambulatory Visit (INDEPENDENT_AMBULATORY_CARE_PROVIDER_SITE_OTHER): Payer: 59

## 2021-06-01 ENCOUNTER — Ambulatory Visit (INDEPENDENT_AMBULATORY_CARE_PROVIDER_SITE_OTHER): Payer: 59 | Admitting: Family Medicine

## 2021-06-01 ENCOUNTER — Other Ambulatory Visit: Payer: Self-pay

## 2021-06-01 VITALS — BP 137/66 | HR 75 | Ht 61.0 in | Wt 193.0 lb

## 2021-06-01 DIAGNOSIS — M1711 Unilateral primary osteoarthritis, right knee: Secondary | ICD-10-CM

## 2021-06-01 DIAGNOSIS — G8929 Other chronic pain: Secondary | ICD-10-CM

## 2021-06-01 DIAGNOSIS — M545 Low back pain, unspecified: Secondary | ICD-10-CM

## 2021-06-01 DIAGNOSIS — Z23 Encounter for immunization: Secondary | ICD-10-CM | POA: Diagnosis not present

## 2021-06-01 DIAGNOSIS — M5417 Radiculopathy, lumbosacral region: Secondary | ICD-10-CM

## 2021-06-01 MED ORDER — LIDOCAINE 5 % EX OINT: 1.0000 "application " | TOPICAL_OINTMENT | CUTANEOUS | 0 refills | Status: DC | PRN

## 2021-06-01 MED ORDER — DICLOFENAC SODIUM 1 % EX GEL: 2.0000 g | Freq: Four times a day (QID) | CUTANEOUS | 1 refills | Status: DC

## 2021-06-01 NOTE — Patient Instructions (Signed)
It was great seeing you today! Your back pain is likely due to strain on your muscles.   As discusssed for your back pain, Take 1500 mg Tylenol twice a day and use Tramadol as needed. Apply Voltragen gel and liodocaine up to four times a day. Be sure to stop by the pharmacy to pick up Lidocaine gel.   Watch for worsening symptoms such as an increasing weakness or loss of sensation legs, increasing pain and the loss of bladder or bowel function. Should any of these occur, go to the emergency department immediately.      I'd like to see you back 6 weeks if your pain does not improve but if you need to be seen earlier than that for any new issues we're happy to fit you in, just give [place] a call!   Call the [facility] office: Address: [address] [place], [place], [place] [address] Phone: [phone]  Take care,   [facility]

## 2021-06-01 NOTE — Progress Notes (Signed)
   SUBJECTIVE:   CHIEF COMPLAINT / HPI:   Megan Miller is a 65 y.o. female here for mild-moderate left sided lower back pain for the past month or so. Pain is intermittent. First noticed after lifting item at home. Denies trauma or previous injuries.  Tried Bayer aspirin without relief.  Previously used Voltaren gel and Tramadol that helped. Seen by Dr. Clayborne Artist in July for similar pain and treated with Baclofen.   No perianal numbness, cancer, unexplained weight loss, immunosuppression, prolonged use of steroids, history of IV drug use, dysuria, pain that is increased or unrelieved by rest, fever, new bladder or bowel incontinence, significant trauma related to age.    PERTINENT  PMH / PSH: reviewed and updated as appropriate   OBJECTIVE:   BP 137/66   Pulse 75   Ht 5\' 1"  (1.549 m)   Wt 193 lb (87.5 kg)   SpO2 100%   BMI 36.47 kg/m   GEN: well appearing older female in no acute distress  CVS: well perfused  RESP: speaking in full sentences without pause, no respiratory distress  Lumbar spine: - Inspection: no gross deformity or asymmetry, swelling or ecchymosis. No overlying skin changes - Palpation: No TTP over the spinous processes, paraspinal muscles, or SI joints b/l - ROM: good active ROM of the lumbar spine in flexion and extension  - Strength: 5/5 strength of lower extremity in L4-S1 nerve root distributions b/l - Neuro: sensation intact in the L4-S1 nerve root distribution b/l,  - Special testing: Negative straight leg raise   ASSESSMENT/PLAN:   Chronic low back pain Tylenol 1500 mg twice a day and use Tramadol as needed. Apply Voltragen gel up to four times a day, Lidocaine gel PRN.    Watch for worsening symptoms such as an increasing weakness or loss of sensation legs, increasing pain and the loss of bladder or bowel function. Should any of these occur, go to the emergency department immediately.      Follow up in 6 weeks, consider imaging at that time       , DO PGY-3, [facility] 06/01/2021

## 2021-06-10 ENCOUNTER — Encounter: Payer: Self-pay | Admitting: Family Medicine

## 2021-06-10 NOTE — Assessment & Plan Note (Signed)
Tylenol 1500 mg twice a day and use Tramadol as needed. Apply Voltragen gel up to four times a day, Lidocaine gel PRN.   Watch for worsening symptoms such as an increasing weakness or loss of sensation legs, increasing pain and the loss of bladder or bowel function. Should any of these occur, go to the emergency department immediately.     Follow up in 6 weeks, consider imaging at that time

## 2021-07-04 ENCOUNTER — Other Ambulatory Visit: Payer: Self-pay | Admitting: Family Medicine

## 2021-07-04 DIAGNOSIS — Z13820 Encounter for screening for osteoporosis: Secondary | ICD-10-CM

## 2021-07-04 DIAGNOSIS — Z1231 Encounter for screening mammogram for malignant neoplasm of breast: Secondary | ICD-10-CM

## 2021-07-13 ENCOUNTER — Other Ambulatory Visit: Payer: Self-pay | Admitting: Family Medicine

## 2021-07-13 DIAGNOSIS — G8929 Other chronic pain: Secondary | ICD-10-CM

## 2021-07-20 ENCOUNTER — Other Ambulatory Visit: Payer: Self-pay

## 2021-07-20 ENCOUNTER — Encounter: Payer: Self-pay | Admitting: Family Medicine

## 2021-07-20 ENCOUNTER — Other Ambulatory Visit: Payer: Self-pay | Admitting: Family Medicine

## 2021-07-20 ENCOUNTER — Ambulatory Visit (INDEPENDENT_AMBULATORY_CARE_PROVIDER_SITE_OTHER): Payer: 59 | Admitting: Family Medicine

## 2021-07-20 VITALS — BP 142/77 | HR 88 | Ht 61.0 in | Wt 197.1 lb

## 2021-07-20 DIAGNOSIS — R0789 Other chest pain: Secondary | ICD-10-CM | POA: Diagnosis not present

## 2021-07-20 DIAGNOSIS — G8929 Other chronic pain: Secondary | ICD-10-CM

## 2021-07-20 DIAGNOSIS — M5417 Radiculopathy, lumbosacral region: Secondary | ICD-10-CM | POA: Diagnosis not present

## 2021-07-20 DIAGNOSIS — R0609 Other forms of dyspnea: Secondary | ICD-10-CM

## 2021-07-20 DIAGNOSIS — M5442 Lumbago with sciatica, left side: Secondary | ICD-10-CM | POA: Diagnosis not present

## 2021-07-20 MED ORDER — TRAMADOL HCL 50 MG PO TABS: 50.0000 mg | ORAL_TABLET | Freq: Two times a day (BID) | ORAL | 0 refills | Status: AC | PRN

## 2021-07-20 NOTE — Assessment & Plan Note (Signed)
Patient is a 65 year old female with atypical chest pain dyspnea on exertion for the past week.  History of stable angina had a normal stress test in 2016.  Echo in August 2019 showed EF of 60-65% and G1DD.   She does have rales on exam.  Possibly pneumonia, angina, new heart failure. Doubt ACS. No hx of PE/DVT.  Advised patient to go to the emergency department but she declined.  Obtain stat D-dimer, chest x-ray, BNP, CBC, CMP, echo.  Follows with cardiologist Dr. Anne Fu has not seen since October 2021.  Encouraged her to follow up with Dr. Anne Fu.  ED precautions given and patient voiced understanding.  EKG: Heart rate 78, normal axis, NSR, no STEMI or ischemic changed  Age Adjusted D-dimer for VTE : D-dimer 0.33, VTE unlikely

## 2021-07-20 NOTE — Assessment & Plan Note (Addendum)
Continue Tylenol 1500 mg twice daily, diclofenac gel and lidocaine gel.  Refilled tramadol.  Reviewed MR lumbar spine April 2019 that shows L4 nerve impingement, moderate canal stenosis and neural foraminal narrowing at L3-L4.   -Consider repeat MRI to evaluate advancing disease

## 2021-07-20 NOTE — Progress Notes (Signed)
° °  SUBJECTIVE:   CHIEF COMPLAINT / HPI:   Chief Complaint  Patient presents with   Chest Pain    sob   Shortness of Breath     Megan Miller is a 65 y.o. female here for chest pain for the past week.  This is lasted a few seconds and then go away.  She got out of car and started having wheezing and substernal chest pain.  Never had any pain like this before. Not having pain currently.  Notes dyspnea on exertion but not always having chest pain.  Denies nausea, vomiting, sweating, leg swelling, fever, cough, sore throat. Has a sharp chest pain at times at night.  Notes some left leg pain that is been there for a while.  No history of blood clots in her leg or lung.  Continues to have low back pain that radiates into her left leg.  Has been taking Tylenol twice a day and using lidocaine gel.  She has ran out of her tramadol and would like a refill.   PERTINENT  PMH / PSH: reviewed and updated as appropriate   OBJECTIVE:   BP (!) 142/77    Pulse 88    Ht 5\' 1"  (1.549 m)    Wt 197 lb 2 oz (89.4 kg)    SpO2 100%    BMI 37.25 kg/m    GEN: pleasant well appearing female, in no acute distress  CV: regular rate and rhythm, well-perfused, no JVP Chest: Chest wall tenderness RESP: no increased work of breathing, basilar rales on the left, no wheezing or high MSK: no lower extremity edema, tenderness in thigh and calf (baseline per patient) SKIN: warm, dry, no rash on visible skin    ASSESSMENT/PLAN:   Atypical chest pain Patient is a 65 year old female with atypical chest pain dyspnea on exertion for the past week.  History of stable angina had a normal stress test in 2016.  Echo in August 2019 showed EF of 60-65% and G1DD.   She does have rales on exam.  Possibly pneumonia, angina, new heart failure. Doubt ACS. No hx of PE/DVT.  Advised patient to go to the emergency department but she declined.  Obtain stat D-dimer, chest x-ray, BNP, CBC, CMP, echo.  Follows with cardiologist Dr.  September 2019 has not seen since October 2021.  Encouraged her to follow up with Dr. November 2021.  ED precautions given and patient voiced understanding.  EKG: Heart rate 78, normal axis, NSR, no STEMI or ischemic changed  Age Adjusted D-dimer for VTE : D-dimer 0.33, VTE unlikely   Chronic low back pain Continue Tylenol 1500 mg twice daily, diclofenac gel and lidocaine gel.  Refilled tramadol.  Reviewed MR lumbar spine April 2019 that shows L4 nerve impingement, moderate canal stenosis and neural foraminal narrowing at L3-L4.   -Consider repeat MRI to evaluate advancing disease    May 2019, DO PGY-3, [facility] 07/20/2021

## 2021-07-20 NOTE — Patient Instructions (Signed)
Get help right away if: Your chest pain is worse. You have a cough that gets worse, or you cough up blood. You have very bad (severe) pain in your belly (abdomen). You pass out (faint). You have either of these for no clear reason: Sudden chest discomfort. Sudden discomfort in your arms, back, neck, or jaw. You have shortness of breath at any time. You suddenly start to sweat, or your skin gets clammy. You feel sick to your stomach (nauseous). You throw up (vomit). You suddenly feel lightheaded or dizzy. You feel very weak or tired. Your heart starts to beat fast, or it feels like it is skipping beats. These symptoms may be an emergency. Do not wait to see if the symptoms will go away. Get medical help right away. Call your local emergency services (911 in the [place] Do not drive yourself to the hospital.   It was great seeing you today!   Visit Remembers: - Stop by the pharmacy to pick up your prescriptions  - Continue to work on your healthy eating habits and incorporating exercise into your daily life.    Regarding lab work today:  Due to recent changes in healthcare laws, you may see the results of your imaging and laboratory studies on MyChart before your provider has had a chance to review them.  I understand that in some cases there may be results that are confusing or concerning to you. Not all laboratory results come back in the same time frame and you may be waiting for multiple results in order to interpret others.  Please give [place] 72 hours in order for your provider to thoroughly review all the results before contacting the office for clarification of your results. If everything is normal, you will get a letter in the mail or a message in My Chart. Please give [place] a call if you do not hear from [place] after 2 weeks.  Please bring all of your medications with you to each visit.    If you haven't already, sign up for My Chart to have easy access to your labs results, and  communication with your primary care physician.  Feel free to call with any questions or concerns at any time, at [phone].   Take care,  Dr. Katherina Right [facility]

## 2021-07-21 LAB — COMPREHENSIVE METABOLIC PANEL
ALT: 14 IU/L (ref 0–32)
AST: 20 IU/L (ref 0–40)
Albumin/Globulin Ratio: 1.3 (ref 1.2–2.2)
Albumin: 4.3 g/dL (ref 3.8–4.8)
Alkaline Phosphatase: 125 IU/L — ABNORMAL HIGH (ref 44–121)
BUN/Creatinine Ratio: 13 (ref 12–28)
BUN: 11 mg/dL (ref 8–27)
Bilirubin Total: 0.6 mg/dL (ref 0.0–1.2)
CO2: 24 mmol/L (ref 20–29)
Calcium: 9 mg/dL (ref 8.7–10.3)
Chloride: 104 mmol/L (ref 96–106)
Creatinine, Ser: 0.85 mg/dL (ref 0.57–1.00)
Globulin, Total: 3.2 g/dL (ref 1.5–4.5)
Glucose: 99 mg/dL (ref 70–99)
Potassium: 3.9 mmol/L (ref 3.5–5.2)
Sodium: 141 mmol/L (ref 134–144)
Total Protein: 7.5 g/dL (ref 6.0–8.5)
eGFR: 76 mL/min/{1.73_m2} (ref >59)

## 2021-07-21 LAB — D-DIMER, QUANTITATIVE: D-DIMER: 0.33 mg/L FEU (ref 0.00–0.49)

## 2021-07-21 LAB — CBC
Hematocrit: 34.8 % (ref 34.0–46.6)
Hemoglobin: 11.9 g/dL (ref 11.1–15.9)
MCH: 28.3 pg (ref 26.6–33.0)
MCHC: 34.2 g/dL (ref 31.5–35.7)
MCV: 83 fL (ref 79–97)
Platelets: 297 10*3/uL (ref 150–450)
RBC: 4.21 x10E6/uL (ref 3.77–5.28)
RDW: 12.6 % (ref 11.7–15.4)
WBC: 6.7 10*3/uL (ref 3.4–10.8)

## 2021-07-21 LAB — BRAIN NATRIURETIC PEPTIDE: BNP: 4.7 pg/mL (ref 0.0–100.0)

## 2021-07-25 NOTE — Progress Notes (Signed)
ECHO has already been scheduled for 1/9 at 9:00am. Aquilla Solian, CMA

## 2021-07-27 ENCOUNTER — Ambulatory Visit
Admission: RE | Admit: 2021-07-27 | Discharge: 2021-07-27 | Disposition: A | Discharge status: Home / Self Care | Payer: 59 | Source: Ambulatory Visit | Attending: Family Medicine | Admitting: Family Medicine

## 2021-08-07 ENCOUNTER — Other Ambulatory Visit (HOSPITAL_COMMUNITY): Payer: 59

## 2021-08-11 ENCOUNTER — Ambulatory Visit
Admission: RE | Admit: 2021-08-11 | Discharge: 2021-08-11 | Disposition: A | Discharge status: Home / Self Care | Payer: Commercial Managed Care - HMO | Source: Ambulatory Visit | Attending: Family Medicine | Admitting: Family Medicine

## 2021-08-11 DIAGNOSIS — Z1231 Encounter for screening mammogram for malignant neoplasm of breast: Secondary | ICD-10-CM

## 2021-08-15 ENCOUNTER — Other Ambulatory Visit: Payer: Self-pay | Admitting: Family Medicine

## 2021-08-15 DIAGNOSIS — R928 Other abnormal and inconclusive findings on diagnostic imaging of breast: Secondary | ICD-10-CM

## 2021-08-16 ENCOUNTER — Other Ambulatory Visit: Payer: Self-pay

## 2021-08-16 ENCOUNTER — Ambulatory Visit (HOSPITAL_COMMUNITY)
Admission: RE | Admit: 2021-08-16 | Discharge: 2021-08-16 | Disposition: A | Discharge status: Home / Self Care | Payer: Medicare Other | Source: Ambulatory Visit | Attending: Family Medicine | Admitting: Family Medicine

## 2021-08-16 DIAGNOSIS — I3139 Other pericardial effusion (noninflammatory): Secondary | ICD-10-CM | POA: Diagnosis not present

## 2021-08-16 DIAGNOSIS — I10 Essential (primary) hypertension: Secondary | ICD-10-CM | POA: Insufficient documentation

## 2021-08-16 DIAGNOSIS — R062 Wheezing: Secondary | ICD-10-CM | POA: Insufficient documentation

## 2021-08-16 DIAGNOSIS — Z8673 Personal history of transient ischemic attack (TIA), and cerebral infarction without residual deficits: Secondary | ICD-10-CM | POA: Diagnosis not present

## 2021-08-16 DIAGNOSIS — R0600 Dyspnea, unspecified: Secondary | ICD-10-CM | POA: Diagnosis not present

## 2021-08-16 DIAGNOSIS — K219 Gastro-esophageal reflux disease without esophagitis: Secondary | ICD-10-CM | POA: Diagnosis not present

## 2021-08-16 DIAGNOSIS — R0789 Other chest pain: Secondary | ICD-10-CM | POA: Diagnosis not present

## 2021-08-16 DIAGNOSIS — R072 Precordial pain: Secondary | ICD-10-CM | POA: Insufficient documentation

## 2021-08-16 DIAGNOSIS — E785 Hyperlipidemia, unspecified: Secondary | ICD-10-CM | POA: Diagnosis not present

## 2021-08-16 DIAGNOSIS — R0609 Other forms of dyspnea: Secondary | ICD-10-CM | POA: Diagnosis not present

## 2021-08-16 LAB — ECHOCARDIOGRAM COMPLETE
Area-P 1/2: 3.89 cm2
S' Lateral: 2.9 cm

## 2021-08-16 NOTE — Progress Notes (Signed)
*  Echocardiogram 2D Echocardiogram has been performed.  Leta Jungling M 08/16/2021, 11:23 AM

## 2021-08-18 ENCOUNTER — Other Ambulatory Visit: Payer: Self-pay | Admitting: Family Medicine

## 2021-08-21 ENCOUNTER — Telehealth: Payer: Self-pay

## 2021-08-21 NOTE — Telephone Encounter (Signed)
Patient returns call to nurse line regarding previous call from PCP. See below.   Called pt to discuss her ECHO results.  Left secure voicemail.  ECHO overall normal there was a trivial amount of fluid around her heart.  I would like to know if her shortness of breath is present and/or worsening (if she returns my call).  If worsening or still present please schedule her for a follow up appointment.    Advised of message, per Dr. Rachael Darby. Patient reports that she continues to have shortness of breath with exertion. This has not worsened since previous visit. Denies chest pain. Provided patient with ED precautions and scheduled for follow up visit on 1/25 at 1050.  Veronda Prude, RN

## 2021-08-23 ENCOUNTER — Other Ambulatory Visit: Payer: Self-pay

## 2021-08-23 ENCOUNTER — Ambulatory Visit (INDEPENDENT_AMBULATORY_CARE_PROVIDER_SITE_OTHER): Payer: Medicare Other | Admitting: Family Medicine

## 2021-08-23 VITALS — BP 124/71 | HR 83 | Ht 61.0 in | Wt 196.1 lb

## 2021-08-23 DIAGNOSIS — G8929 Other chronic pain: Secondary | ICD-10-CM | POA: Diagnosis not present

## 2021-08-23 DIAGNOSIS — R0789 Other chest pain: Secondary | ICD-10-CM | POA: Diagnosis not present

## 2021-08-23 DIAGNOSIS — M25551 Pain in right hip: Secondary | ICD-10-CM

## 2021-08-23 DIAGNOSIS — M25552 Pain in left hip: Secondary | ICD-10-CM

## 2021-08-23 DIAGNOSIS — R928 Other abnormal and inconclusive findings on diagnostic imaging of breast: Secondary | ICD-10-CM | POA: Diagnosis not present

## 2021-08-23 NOTE — Progress Notes (Signed)
° °  SUBJECTIVE:   CHIEF COMPLAINT / HPI:   Chief Complaint  Patient presents with   Follow-up     Megan Miller is a 66 y.o. female here for to discuss echo results and hip pain.    Pt reports intermittent substernal chest pain and shortness of breath. At times chest pain is worse with exertion.  No vomiting, sweating, weakness, pre-syncope, LOC.   She has been taking two Tylenol a day for hip pain. Pain is worse with standing. No loss of balance. No weakness or tingling in lower extremities.    PERTINENT  PMH / PSH: reviewed and updated as appropriate   OBJECTIVE:   BP 124/71    Pulse 83    Ht 5\' 1"  (1.549 m)    Wt 196 lb 2 oz (89 kg)    SpO2 100%    BMI 37.06 kg/m    GEN: pleasant well appearing older female, in no acute distress  CV: regular rate and rhythm, no murmur appreciated, no friction rub, no gallop RESP: no increased work of breathing, clear to ascultation bilaterally MSK: no LE edema, slow gait with normal stride, no tenderness to palpation over greater trochanter, normal strength 5/5 SKIN: warm, dry   ASSESSMENT/PLAN:     Atypical Chest Pain Respiratory and cardiac exam unremarkable. EKG NSR without ST elevations. EF 60-65%, regional wall motion unable to be assessed, trivial circumferential pericardial effusion. No pericarditis on EKG. ?Anginal equivalent. Advised to follow up with cardiologist, Dr Marlou Porch. Pt has not been seen since Oct 2021.   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 Now starting to have right hip pain as well. Suspect underlying OA.  Recommended Tylenol 1500 mg BID PRN with topical Lidocaine patches. Consider physical therapy if not improving.   Abnormal Mammogram Scheduled for diagnostic mammogram with breast US on 09/06/20.  Pt is aware of abnormal exam and plans to keep her appointment.   Lyndee Hensen, DO PGY-3, [facility] 08/24/2021

## 2021-08-23 NOTE — Patient Instructions (Addendum)
For your chest pain: Follow up with your cardiologist, Dr Anne Fu. Call his office to schedule an appointment.   Address: [address], [place], [place] [address] Phone: [phone]  You have arthritis in your hip. For pain, Take 1500 mg Tylenol (3-500 mg tablets) up to three times a day as needed for pain. Be sure to stop by the pharmacy to pick up Lidocaine patches. Apply for 12 hours and then remove.

## 2021-09-06 ENCOUNTER — Ambulatory Visit
Admission: RE | Admit: 2021-09-06 | Discharge: 2021-09-06 | Disposition: A | Discharge status: Home / Self Care | Payer: Commercial Managed Care - HMO | Source: Ambulatory Visit | Attending: Family Medicine | Admitting: Family Medicine

## 2021-09-06 ENCOUNTER — Ambulatory Visit
Admission: RE | Admit: 2021-09-06 | Discharge: 2021-09-06 | Disposition: A | Discharge status: Home / Self Care | Payer: Medicare Other | Source: Ambulatory Visit | Attending: Family Medicine | Admitting: Family Medicine

## 2021-09-06 ENCOUNTER — Other Ambulatory Visit: Payer: Self-pay | Admitting: Family Medicine

## 2021-09-06 DIAGNOSIS — R928 Other abnormal and inconclusive findings on diagnostic imaging of breast: Secondary | ICD-10-CM

## 2021-09-06 DIAGNOSIS — R922 Inconclusive mammogram: Secondary | ICD-10-CM | POA: Diagnosis not present

## 2021-09-11 ENCOUNTER — Ambulatory Visit: Payer: Medicare Other

## 2021-09-11 ENCOUNTER — Other Ambulatory Visit: Payer: Self-pay

## 2021-09-21 ENCOUNTER — Other Ambulatory Visit: Payer: Self-pay

## 2021-09-21 ENCOUNTER — Encounter: Payer: Self-pay | Admitting: Family Medicine

## 2021-09-21 ENCOUNTER — Ambulatory Visit (INDEPENDENT_AMBULATORY_CARE_PROVIDER_SITE_OTHER): Payer: Medicare Other | Admitting: Family Medicine

## 2021-09-21 ENCOUNTER — Ambulatory Visit (HOSPITAL_COMMUNITY): Admission: RE | Admit: 2021-09-21 | Payer: Medicare Other | Source: Ambulatory Visit

## 2021-09-21 VITALS — BP 142/83 | HR 97 | Ht 61.0 in | Wt 191.4 lb

## 2021-09-21 DIAGNOSIS — R600 Localized edema: Secondary | ICD-10-CM

## 2021-09-21 DIAGNOSIS — K5909 Other constipation: Secondary | ICD-10-CM | POA: Diagnosis not present

## 2021-09-21 DIAGNOSIS — K5900 Constipation, unspecified: Secondary | ICD-10-CM

## 2021-09-21 MED ORDER — POLYETHYLENE GLYCOL 3350 17 GM/SCOOP PO POWD: ORAL | 0 refills | Status: AC

## 2021-09-21 MED ORDER — SENNA 8.6 MG PO TABS: 2.0000 | ORAL_TABLET | Freq: Every day | ORAL | 0 refills | Status: AC | PRN

## 2021-09-21 NOTE — Progress Notes (Signed)
° °  SUBJECTIVE:   CHIEF COMPLAINT / HPI:    Megan Miller is a 67 y.o. female here for bilateral leg pain for the past month.  States that her right leg is now swollen.  Denies calf pain, chest pain, falls, injury history of blood clots in her legs or lungs. Continues to have intermittent shortness of breath.  She has not seen her cardiologist.  Pt reports no bowel movement this week.  Notes that she has not been eating as well.  Her stools are hard to come out.  She has not taken anything over-the-counter for this.   PERTINENT  PMH / PSH: reviewed and updated as appropriate   OBJECTIVE:   BP (!) 142/83    Pulse 97    Ht 5\' 1"  (1.549 m)    Wt 191 lb 6.4 oz (86.8 kg)    SpO2 100%    BMI 36.16 kg/m    GEN: elderly female, in no acute distress CV: regular rate and rhythm, well-perfused, equal DP pulses RESP: no increased work of breathing, clear to ascultation bilaterally MSK: Right lower extremity edema pitting to the knee, see images below, no calf tenderness SKIN: warm, dry       ASSESSMENT/PLAN:   CONSTIPATION, CHRONIC Patient with history of chronic constipation not using stool softeners regularly.  Provided and reviewed constipation clean out (Miralax + Senna) and maintenance plan ((Miralax) with patient. Discussed eating small meals frequently and increased fluid intake, as well as gradual increase in fiber intake with dried fruits, vegetables with skins, beans, whole grains and cereal. Goal 30g of fiber per day. Encouraged drinking hot beverages and prune juice; add probiotic-containing foods like pasteurized yogurt and kefir; encourage increased physical activity.    Last colonoscopy 08/23/2011 by Dr Watt Climes [facility]).  She is an EGD for esophageal dilation earlier in March 2022 at [facility].  She is due for colonoscopy again.    Leg edema, right Patient with history of a right ankle edema that was thought to be due to arthritic changes versus venous insufficiency. Recent D  dimer 0.33, ECHO was unremarkable, not anemic, stable LFTs and renal function. Advised pt to follow up with her cardiologist Dr. Marlou Porch. Given the size of the lower extremity compared to the left we will obtain a stat Doppler.   Recommended compression was a elevation of her legs when sitting.      Lyndee Hensen, DO PGY-3, [facility] 09/24/2021

## 2021-09-21 NOTE — Patient Instructions (Addendum)
Thank you for coming in to the office today.   For constipation clean out, take 8 caps of Miralax in 32 oz of fluid to achieve stools are regular and soft. Stop if your get diarrhea. For stool maintenance, give her 1 cap  daily for at least 2-3 months.   [address]  [facility]  Go get your ultrasound today at 3pm!   Follow up with Dr Anne Fu, your cardiologist as your have new leg swelling.   Take Care,   Dr. Rachael Darby

## 2021-09-22 ENCOUNTER — Ambulatory Visit (HOSPITAL_COMMUNITY)
Admission: RE | Admit: 2021-09-22 | Discharge: 2021-09-22 | Disposition: A | Discharge status: Home / Self Care | Payer: Medicare Other | Source: Ambulatory Visit | Attending: Family Medicine | Admitting: Family Medicine

## 2021-09-22 DIAGNOSIS — R600 Localized edema: Secondary | ICD-10-CM | POA: Insufficient documentation

## 2021-09-22 NOTE — Progress Notes (Signed)
Lower extremity venous has been completed.   Preliminary results in CV Proc.   Megan Miller 09/22/2021 4:10 PM

## 2021-09-24 NOTE — Assessment & Plan Note (Addendum)
Patient with history of a right ankle edema that was thought to be due to arthritic changes versus venous insufficiency. Recent D dimer 0.33, ECHO was unremarkable, not anemic, stable LFTs and renal function. Advised pt to follow up with her cardiologist Dr. Anne Fu. Given the size of the lower extremity compared to the left we will obtain a stat Doppler.   Recommended compression was a elevation of her legs when sitting.

## 2021-09-24 NOTE — Assessment & Plan Note (Signed)
Patient with history of chronic constipation not using stool softeners regularly.  Provided and reviewed constipation clean out (Miralax + Senna) and maintenance plan ((Miralax) with patient. Discussed eating small meals frequently and increased fluid intake, as well as gradual increase in fiber intake with dried fruits, vegetables with skins, beans, whole grains and cereal. Goal 30g of fiber per day. Encouraged drinking hot beverages and prune juice; add probiotic-containing foods like pasteurized yogurt and kefir; encourage increased physical activity.    Last colonoscopy 08/23/2011 by Dr Ewing [facility]).  She is an EGD for esophageal dilation earlier in March 2022 at [facility].  She is due for colonoscopy again.

## 2021-10-02 ENCOUNTER — Other Ambulatory Visit: Payer: Self-pay | Admitting: Physician Assistant

## 2021-10-04 ENCOUNTER — Encounter: Payer: Self-pay | Admitting: Cardiology

## 2021-10-04 ENCOUNTER — Ambulatory Visit (INDEPENDENT_AMBULATORY_CARE_PROVIDER_SITE_OTHER): Payer: Medicare Other | Admitting: Cardiology

## 2021-10-04 ENCOUNTER — Other Ambulatory Visit: Payer: Self-pay

## 2021-10-04 VITALS — BP 132/84 | HR 91 | Ht 61.0 in | Wt 192.2 lb

## 2021-10-04 DIAGNOSIS — E7800 Pure hypercholesterolemia, unspecified: Secondary | ICD-10-CM | POA: Insufficient documentation

## 2021-10-04 DIAGNOSIS — Z01812 Encounter for preprocedural laboratory examination: Secondary | ICD-10-CM | POA: Diagnosis not present

## 2021-10-04 DIAGNOSIS — R600 Localized edema: Secondary | ICD-10-CM

## 2021-10-04 DIAGNOSIS — I208 Other forms of angina pectoris: Secondary | ICD-10-CM

## 2021-10-04 DIAGNOSIS — I10 Essential (primary) hypertension: Secondary | ICD-10-CM | POA: Diagnosis not present

## 2021-10-04 DIAGNOSIS — I2089 Other forms of angina pectoris: Secondary | ICD-10-CM

## 2021-10-04 LAB — BASIC METABOLIC PANEL
BUN/Creatinine Ratio: 19 (ref 12–28)
BUN: 14 mg/dL (ref 8–27)
CO2: 23 mmol/L (ref 20–29)
Calcium: 8.9 mg/dL (ref 8.7–10.3)
Chloride: 107 mmol/L — ABNORMAL HIGH (ref 96–106)
Creatinine, Ser: 0.74 mg/dL (ref 0.57–1.00)
Glucose: 99 mg/dL (ref 70–99)
Potassium: 4.1 mmol/L (ref 3.5–5.2)
Sodium: 142 mmol/L (ref 134–144)
eGFR: 89 mL/min/{1.73_m2} (ref >59)

## 2021-10-04 MED ORDER — METOPROLOL TARTRATE 100 MG PO TABS: 100.0000 mg | ORAL_TABLET | ORAL | 0 refills | Status: DC

## 2021-10-04 NOTE — Assessment & Plan Note (Signed)
Lower extremity vascular studies have been negative in the past for DVT.  Likely secondary to multifactorial factors.  Continue encourage weight loss.  Exercise, walking, compression hose for conservative management.  Leg elevation. ?

## 2021-10-04 NOTE — Patient Instructions (Signed)
Medication Instructions:  ?The current medical regimen is effective;  continue present plan and medications. ? ?*If you need a refill on your cardiac medications before your next appointment, please call your pharmacy* ? ?Lab Work: ?Please have blood work today (BMP) ? ?If you have labs (blood work) drawn today and your tests are completely normal, you will receive your results only by: ?MyChart Message (if you have MyChart) OR ?A paper copy in the mail ?If you have any lab test that is abnormal or we need to change your treatment, we will call you to review the results. ? ? ?Testing/Procedures: ? ? ?Your cardiac CT will be scheduled at:  ? ?[facility] ?[address] ?[place], [place] [address] ?[phone] ? ?Please arrive at the [facility] and Children[facility] Entrance (Entrance C2) of [facility] 30 minutes prior to test start time. ?You can use the FREE valet parking offered at entrance C (encouraged to control the heart rate for the test)  ?Proceed to the [facility] Radiology Department (first floor) to check-in and test prep. ? ?All radiology patients and guests should use entrance C2 at [facility], accessed from [facility], even though the hospital's physical address listed is [address]. ? ? ? ?Please follow these instructions carefully (unless otherwise directed): ? ?On the Night Before the Test: ?Be sure to Drink plenty of water. ?Do not consume any caffeinated/decaffeinated beverages or chocolate 12 hours prior to your test. ?Do not take any antihistamines 12 hours prior to your test. ? ?On the Day of the Test: ?Drink plenty of water until 1 hour prior to the test. ?Do not eat any food 4 hours prior to the test. ?You may take your regular medications prior to the test.  ?Take metoprolol (Lopressor) two hours prior to test. ?HOLD Furosemide/Hydrochlorothiazide morning of the test. ?FEMALES- please wear underwire-free bra if available, avoid dresses & tight  clothing ? ?After the Test: ?Drink plenty of water. ?After receiving IV contrast, you may experience a mild flushed feeling. This is normal. ?On occasion, you may experience a mild rash up to 24 hours after the test. This is not dangerous. If this occurs, you can take Benadryl 25 mg and increase your fluid intake. ?If you experience trouble breathing, this can be serious. If it is severe call 911 IMMEDIATELY. If it is mild, please call our office. ?If you take any of these medications: Glipizide/Metformin, Avandament, Glucavance, please do not take 48 hours after completing test unless otherwise instructed. ? ?We will call to schedule your test 2-4 weeks out understanding that some insurance companies will need an authorization prior to the service being performed.  ? ?For non-scheduling related questions, please contact the cardiac imaging nurse navigator should you have any questions/concerns: ?Rockwell [place], Cardiac Imaging Nurse Navigator ?Larey Brick, Cardiac Imaging Nurse Navigator ?[facility] ?Direct Office Dial: [phone]  ? ?For scheduling needs, including cancellations and rescheduling, please call [place], [phone]. ? ?Follow-Up: ?At [facility], you and your health needs are our priority.  As part of our continuing mission to provide you with exceptional heart care, we have created designated Provider Care Teams.  These Care Teams include your primary Cardiologist (physician) and Advanced Practice Providers (APPs -  Physician Assistants and Nurse Practitioners) who all work together to provide you with the care you need, when you need it. ? ?We recommend signing up for the patient portal called "MyChart".  Sign up information is provided  on this After Visit Summary.  MyChart is used to connect with patients for Virtual Visits (Telemedicine).  Patients are able to view lab/test results, encounter notes, upcoming appointments, etc.  Non-urgent messages can be sent  to your provider as well.   ?To learn more about what you can do with MyChart, go to [email or web address].   ? ?Your next appointment:   ?Follow up will be based on the results of the above testing. ? ?Thank you for choosing [facility]!! ? ? ? ?
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
## 2021-10-04 NOTE — Assessment & Plan Note (Signed)
Having some chest pressure as well as shortness of breath when ambulating.  This could be her anginal equivalent.  We will go ahead and check a coronary CT scan with possible FFR analysis for further evaluation.  Echocardiogram was reassuring showing normal pump function.  Trivial pericardial effusion should not be of any clinical concern.  No significant valvular abnormalities. ?

## 2021-10-04 NOTE — Assessment & Plan Note (Signed)
Excellent LDL of 33 back in 2021.  Currently on atorvastatin 40 mg a day. ?

## 2021-10-31 ENCOUNTER — Telehealth (HOSPITAL_COMMUNITY): Payer: Self-pay | Admitting: Emergency Medicine

## 2021-10-31 NOTE — Telephone Encounter (Signed)
Attempted to call patient regarding upcoming cardiac CT appointment. °Left message on voicemail with name and callback number °Amelie Caracci RN Navigator Cardiac Imaging °[facility] °[phone] Office °[phone] Cell ° °

## 2021-11-01 ENCOUNTER — Ambulatory Visit (HOSPITAL_COMMUNITY)
Admission: RE | Admit: 2021-11-01 | Discharge: 2021-11-01 | Disposition: A | Discharge status: Home / Self Care | Payer: Medicare Other | Source: Ambulatory Visit | Attending: Cardiology | Admitting: Cardiology

## 2021-11-01 DIAGNOSIS — I208 Other forms of angina pectoris: Secondary | ICD-10-CM | POA: Insufficient documentation

## 2021-11-01 DIAGNOSIS — I2089 Other forms of angina pectoris: Secondary | ICD-10-CM

## 2021-11-01 MED ORDER — IOHEXOL 350 MG/ML SOLN
95.0000 mL | Freq: Once | INTRAVENOUS | Status: AC | PRN
  Administered 2021-11-01: 95 mL via INTRAVENOUS

## 2021-11-01 MED ORDER — NITROGLYCERIN 0.4 MG SL SUBL
0.8000 mg | SUBLINGUAL_TABLET | Freq: Once | SUBLINGUAL | Status: AC
  Administered 2021-11-01: 0.8 mg via SUBLINGUAL

## 2021-11-01 MED ORDER — METOPROLOL TARTRATE 5 MG/5ML IV SOLN
INTRAVENOUS | Status: AC
  Filled 2021-11-01: qty 10

## 2021-11-01 MED ORDER — DILTIAZEM HCL 25 MG/5ML IV SOLN: 5.0000 mg | INTRAVENOUS | Status: DC | PRN

## 2021-11-01 MED ORDER — METOPROLOL TARTRATE 5 MG/5ML IV SOLN
10.0000 mg | INTRAVENOUS | Status: DC | PRN
  Administered 2021-11-01: 5 mg via INTRAVENOUS

## 2021-11-01 MED ORDER — NITROGLYCERIN 0.4 MG SL SUBL
SUBLINGUAL_TABLET | SUBLINGUAL | Status: AC
  Filled 2021-11-01: qty 2

## 2021-11-06 ENCOUNTER — Telehealth: Payer: Self-pay | Admitting: Cardiology

## 2021-11-06 NOTE — Telephone Encounter (Signed)
Left a message to call back.

## 2021-11-06 NOTE — Telephone Encounter (Signed)
Patient is returning call for results. Please advise  

## 2021-11-07 NOTE — Telephone Encounter (Signed)
The patient has been notified of the result and verbalized understanding.  All questions (if any) were answered. ?Lendon Ka, RN 11/07/2021 11:09 AM  ? ?

## 2021-11-07 NOTE — Telephone Encounter (Signed)
Pt called in returning Megan Miller phone call about her test results  ? ?Best number [phone] ? ?

## 2021-11-20 ENCOUNTER — Encounter: Payer: Self-pay | Admitting: Family Medicine

## 2021-11-20 ENCOUNTER — Ambulatory Visit (INDEPENDENT_AMBULATORY_CARE_PROVIDER_SITE_OTHER): Payer: Medicare Other | Admitting: Family Medicine

## 2021-11-20 VITALS — BP 142/63 | HR 80 | Wt 188.0 lb

## 2021-11-20 DIAGNOSIS — L299 Pruritus, unspecified: Secondary | ICD-10-CM | POA: Diagnosis not present

## 2021-11-20 DIAGNOSIS — R21 Rash and other nonspecific skin eruption: Secondary | ICD-10-CM | POA: Diagnosis not present

## 2021-11-20 MED ORDER — TRIAMCINOLONE ACETONIDE 0.1 % EX CREA: 1.0000 "application " | TOPICAL_CREAM | Freq: Two times a day (BID) | CUTANEOUS | 0 refills | Status: DC

## 2021-11-20 MED ORDER — NEUTROGENA FACIAL SOAP EX BAR: 1.0000 | CHEWABLE_BAR | Freq: Two times a day (BID) | CUTANEOUS | 2 refills | Status: AC

## 2021-11-20 MED ORDER — HYDROXYZINE HCL 10 MG PO TABS: 10.0000 mg | ORAL_TABLET | Freq: Every evening | ORAL | 0 refills | Status: DC | PRN

## 2021-11-20 MED ORDER — CETIRIZINE HCL 10 MG PO TABS: 10.0000 mg | ORAL_TABLET | Freq: Every day | ORAL | 3 refills | Status: DC

## 2021-11-20 NOTE — Patient Instructions (Signed)
Stop by the pharmacy to pick up your prescriptions. If you are still itching in a week, make a follow up appointment.  ? ?If you sores in the mouth, headache, fever, seek urgent medical care.  ? ?Be sure to wash your bedding in hot water and dry on high heat. Consider getting the dog checked for fleas.   ? ? ? ? ?

## 2021-11-20 NOTE — Progress Notes (Signed)
? ?  SUBJECTIVE:  ? ?CHIEF COMPLAINT / HPI:  ? ?Chief Complaint  ?Patient presents with  ? Rash  ? ? ? ?Megan Miller is a 66 y.o. female here for itching and rash that started on Friday.  States that she has itching all over her body as well as behind her years.  Reports her roommates dog but also lays on the couch has been scratching.  She has tried moisturizing her skin without has not helped.  She does not have similar symptoms recently.  Denies any bug bites including bedbugs.  Her roommate does not similar itching.  Notes that the rash started on her hand and spread to her other extremities.  She has not gone hiking and has not been around any farm animals. ? ?Denies fever, headache, neck pain, vomiting, diarrhea, abdominal pain, chest discomfort.  She has been in her normal state of health  She has no sores in her mouth.  He has not started any new medications. ? ? ?PERTINENT  PMH / PSH: reviewed and updated as appropriate  ? ?OBJECTIVE:  ? ?BP (!) 142/63   Pulse 80   Wt 188 lb (85.3 kg)   SpO2 98%   BMI 35.52 kg/m?   ? ?GEN: pleasant well appearing female, in no acute distress  ?CV: regular rate and rhythm ?RESP: no increased work of breathing, clear to ascultation bilaterally ?MSK: no edema ?SKIN: warm, dry, erythematous macules and papules with excoriations on extremities, trunk and right hand ? ? ? ? ? ? ? ? ?ASSESSMENT/PLAN:  ? ? ?Rash  ?Differential includes insect bite (scabies or fleas), contact versus irritant dermatitis, drug-induced, cholestasis.  Trial moderate potency topical steroid with oral cetirizine daily and hydroxyzine as needed at nighttime.  ED and return precautions provided.  ? ?Lyndee Hensen, DO ?PGY-3, [facility] ?11/20/2021  ? ? ? ? ? ? ? ? ?

## 2021-11-22 ENCOUNTER — Encounter: Payer: Self-pay | Admitting: Family Medicine

## 2021-12-12 ENCOUNTER — Encounter: Payer: Medicare Other | Admitting: Family Medicine

## 2021-12-12 ENCOUNTER — Telehealth: Payer: Self-pay

## 2021-12-12 DIAGNOSIS — Z1211 Encounter for screening for malignant neoplasm of colon: Secondary | ICD-10-CM

## 2021-12-12 NOTE — Telephone Encounter (Signed)
Patient calls nurse line requesting a referral for a colonoscopy.  ? ?Last colonoscopy was January 2013.  ? ?Will forward to PCP.  ?

## 2021-12-13 ENCOUNTER — Other Ambulatory Visit: Payer: Self-pay

## 2021-12-14 MED ORDER — CARVEDILOL 3.125 MG PO TABS: 3.1250 mg | ORAL_TABLET | Freq: Two times a day (BID) | ORAL | 0 refills | Status: DC

## 2022-01-02 ENCOUNTER — Encounter: Payer: Self-pay | Admitting: *Deleted

## 2022-01-09 ENCOUNTER — Ambulatory Visit (INDEPENDENT_AMBULATORY_CARE_PROVIDER_SITE_OTHER): Payer: Medicare Other | Admitting: Family Medicine

## 2022-01-09 ENCOUNTER — Encounter: Payer: Self-pay | Admitting: Family Medicine

## 2022-01-09 VITALS — BP 139/71 | HR 75 | Ht 61.0 in | Wt 185.0 lb

## 2022-01-09 DIAGNOSIS — M79604 Pain in right leg: Secondary | ICD-10-CM

## 2022-01-09 DIAGNOSIS — G2581 Restless legs syndrome: Secondary | ICD-10-CM

## 2022-01-09 DIAGNOSIS — M79605 Pain in left leg: Secondary | ICD-10-CM | POA: Diagnosis not present

## 2022-01-09 MED ORDER — GABAPENTIN 100 MG PO CAPS: 100.0000 mg | ORAL_CAPSULE | Freq: Three times a day (TID) | ORAL | 3 refills | Status: DC

## 2022-01-09 NOTE — Patient Instructions (Signed)
Follow up with me in 2 weeks as scheduled.    Regarding your iron levels: we tested today; I will let you know if this is a cause of your leg pain.   For help with sleep and your leg pain: started Gabapentin 100 mg at bedtime, can increase to three times a day if needed   Your blood flow in your leg was normal.

## 2022-01-09 NOTE — Progress Notes (Signed)
   SUBJECTIVE:   CHIEF COMPLAINT / HPI:   Chief Complaint  Patient presents with   Leg Pain     Megan Miller is a 66 y.o. female here for bilateral leg pain.  Patient reports symptoms started about a year ago.  She feels like it is worse at night.  She often tosses and turns at night.  States that sometimes worse with walking and she has to stop.  Denies recent fall.  Overall symptoms have unchanged in the last year.  She denies history of smoking.  No recent fever, illness, trauma.    PERTINENT  PMH / PSH: reviewed and updated as appropriate   OBJECTIVE:   BP 139/71   Pulse 75   Ht 5\' 1"  (1.549 m)   Wt 185 lb (83.9 kg)   SpO2 99%   BMI 34.96 kg/m    GEN: pleasant well appearing female, in no acute distress  CV: regular rate and rhythm RESP: no increased work of breathing, clear to ascultation bilaterally MSK: Chronic right lower extremity edema, no calf tenderness bilaterally, no overlying skin changes SKIN: warm, dry,    ASSESSMENT/PLAN:    Bilateral leg pain Patient is a 66 year old female with bilateral leg pain for the past year.  Etiology unclear.  Differential diagnosis includes peripheral artery disease, deconditioning, iron deficiency, restless leg syndrome, muscle strain.  ABIs performed in the clinic today or normal.  Results under cardiovascular procedure.  Obtain ferritin.  Start gabapentin 100 mg 3 times daily as needed.  Follow-up if not improving in 4 to 6 weeks.   Lyndee Hensen, DO PGY-3, [facility] 01/09/2022

## 2022-01-16 ENCOUNTER — Encounter: Payer: Self-pay | Admitting: Family Medicine

## 2022-01-24 ENCOUNTER — Encounter: Payer: Self-pay | Admitting: Family Medicine

## 2022-01-24 ENCOUNTER — Ambulatory Visit (INDEPENDENT_AMBULATORY_CARE_PROVIDER_SITE_OTHER): Payer: Medicare Other | Admitting: Family Medicine

## 2022-01-24 VITALS — BP 151/69 | HR 86 | Ht 61.0 in | Wt 185.0 lb

## 2022-01-24 DIAGNOSIS — M79605 Pain in left leg: Secondary | ICD-10-CM | POA: Diagnosis not present

## 2022-01-24 DIAGNOSIS — R252 Cramp and spasm: Secondary | ICD-10-CM | POA: Diagnosis not present

## 2022-01-24 DIAGNOSIS — M79604 Pain in right leg: Secondary | ICD-10-CM

## 2022-01-24 MED ORDER — NAPROXEN 500 MG PO TABS: 500.0000 mg | ORAL_TABLET | Freq: Two times a day (BID) | ORAL | 0 refills | Status: DC

## 2022-01-24 NOTE — Progress Notes (Signed)
   SUBJECTIVE:   CHIEF COMPLAINT / HPI:    Megan Miller is a 66 y.o. female here for :    Bilateral Leg Pain Ongoing for the past year. Pain described as "achy, throbbing, sore." She fell in January after tripping on a nail. She was able to get up by herself. Tried the Gabapentin didn't help her pain. Voltaren gel helps but the pain starts back. Nothing makes it better or worse. Pain is generally worse at night. Pain located from the knee down ankle. Has low back pain.    PERTINENT  PMH / PSH: reviewed and updated as appropriate   OBJECTIVE:   BP (!) 151/69   Pulse 86   Ht 5\' 1"  (1.549 m)   Wt 185 lb (83.9 kg)   SpO2 97%   BMI 34.96 kg/m    GEN: pleasant well appearing female, in no acute distress  CV: regular rate and rhythm RESP: no increased work of breathing, clear to ascultation bilaterally MSK: Chronic right lower extremity edema, no calf tenderness bilaterally, no overlying skin changes, bilateral knee with no appreciable joint line tenderness, equal and full range of motion, no patellar tenderness, no ligamentous laxity SKIN: warm, dry  ASSESSMENT/PLAN:   Bilateral leg pain Chronic.   Has been Rx Baclofen and Gabapentin without relief.  Patient is not currently taking these medications so will discontine. Voltaren gel helps some.  Trial short course of naprosyn.  DDx: hypokalemia, diabetic neuropathy, iron def anemia, rhabdomyolysis, PAD, age related deconditioning. Doubt PAD as ABIs were normal.  Electrolyte review with no recent history of hypokalemia.  Recommended physical therapy and she is agreeable. Follow-up if not improving in 6 weeks. - Consider CK as pt taking statin  - Follow up on ferritin, a1c     , DO PGY-3, [facility] 01/26/2022

## 2022-01-24 NOTE — Patient Instructions (Addendum)
For your leg pain: Stop taking the gabapentin.  Start taking the Naprosyn twice a day as needed for pain.  Be sure to take this medication with food as it can irritate your stomach.  We got blood work today to look for other causes of leg pain.  I will send you more chart message about your results.   A referral was placed for physical therapy.  Someone should call to schedule in the next 2 weeks.  If no one has contacted you in 2 weeks time, please call the office.

## 2022-01-25 LAB — HEMOGLOBIN A1C
Est. average glucose Bld gHb Est-mCnc: 108 mg/dL
Hgb A1c MFr Bld: 5.4 % (ref 4.8–5.6)

## 2022-01-26 NOTE — Assessment & Plan Note (Signed)
Chronic.   Has been Rx Baclofen and Gabapentin without relief.  Patient is not currently taking these medications so will discontine. Voltaren gel helps some.  Trial short course of naprosyn.  DDx: hypokalemia, diabetic neuropathy, iron def anemia, rhabdomyolysis, PAD, age related deconditioning. Doubt PAD as ABIs were normal.  Electrolyte review with no recent history of hypokalemia.  Recommended physical therapy and she is agreeable. Follow-up if not improving in 6 weeks. - Consider CK as pt taking statin  - Follow up on ferritin, a1c

## 2022-02-04 ENCOUNTER — Other Ambulatory Visit: Payer: Self-pay | Admitting: Family Medicine

## 2022-02-04 DIAGNOSIS — M79604 Pain in right leg: Secondary | ICD-10-CM

## 2022-02-05 ENCOUNTER — Telehealth: Payer: Self-pay | Admitting: *Deleted

## 2022-02-05 NOTE — Telephone Encounter (Signed)
LM for patient to call [facility] about her pending referral.  They reached out to her for an appt but she didn't answer.  Patient can call them to schedule her appointment at [phone].  Abbegayle Denault,CMA

## 2022-02-11 ENCOUNTER — Other Ambulatory Visit: Payer: Self-pay | Admitting: Family Medicine

## 2022-02-11 DIAGNOSIS — L299 Pruritus, unspecified: Secondary | ICD-10-CM

## 2022-02-11 DIAGNOSIS — R21 Rash and other nonspecific skin eruption: Secondary | ICD-10-CM

## 2022-02-12 NOTE — Telephone Encounter (Signed)
Patient calls back to inquire why the refill was not provided.  She reports using this for years. Jone Baseman, CMA

## 2022-02-13 ENCOUNTER — Other Ambulatory Visit: Payer: Self-pay | Admitting: Student

## 2022-02-13 DIAGNOSIS — R21 Rash and other nonspecific skin eruption: Secondary | ICD-10-CM

## 2022-02-13 MED ORDER — TRIAMCINOLONE ACETONIDE 0.1 % EX OINT: 1.0000 | TOPICAL_OINTMENT | Freq: Two times a day (BID) | CUTANEOUS | 0 refills | Status: DC

## 2022-02-19 DIAGNOSIS — Z20822 Contact with and (suspected) exposure to covid-19: Secondary | ICD-10-CM | POA: Diagnosis not present

## 2022-02-19 NOTE — Patient Instructions (Incomplete)
It was nice seeing you today!  See if you can find black cohosh supplement to help with hot flashes.  Give [place] a call if there is anything we can provide to help you get the Embr bracelet.  Stay well, Littie Deeds, MD [facility] [phone]  --  Make sure to check out at the front desk before you leave today.  Please arrive at least 15 minutes prior to your scheduled appointments.  If you had blood work today, I will send you a MyChart message or a letter if results are normal. Otherwise, I will give you a call.  If you had a referral placed, they will call you to set up an appointment. Please give [place] a call if you don't hear back in the next 2 weeks.  If you need additional refills before your next appointment, please call your pharmacy first.

## 2022-02-19 NOTE — Progress Notes (Unsigned)
    SUBJECTIVE:   CHIEF COMPLAINT / HPI:  No chief complaint on file.   ***  PERTINENT  PMH / PSH: CVA  Patient Care Team: Levin Erp, MD as PCP - General (Family Medicine) Jake Bathe, MD as PCP - Cardiology (Cardiology)   OBJECTIVE:   There were no vitals taken for this visit.  Physical Exam      01/24/2022    1:37 PM  Depression screen PHQ 2/9  Decreased Interest 1  Down, Depressed, Hopeless 0  PHQ - 2 Score 1  Altered sleeping 0  Tired, decreased energy 1  Change in appetite 0  Feeling bad or failure about yourself  0  Trouble concentrating 0  Moving slowly or fidgety/restless 0  Suicidal thoughts 0  PHQ-9 Score 2  Difficult doing work/chores Somewhat difficult     {Show previous vital signs (optional):23777}  {Labs  Heme  Chem  Endocrine  Serology  Results Review (optional):23779}  ASSESSMENT/PLAN:   No problem-specific Assessment & Plan notes found for this encounter.    No follow-ups on file.   Littie Deeds, MD [facility]

## 2022-02-20 ENCOUNTER — Ambulatory Visit (INDEPENDENT_AMBULATORY_CARE_PROVIDER_SITE_OTHER): Payer: Medicare Other | Admitting: Family Medicine

## 2022-02-20 ENCOUNTER — Encounter: Payer: Self-pay | Admitting: Family Medicine

## 2022-02-20 VITALS — BP 119/62 | HR 82 | Resp 18 | Wt 182.0 lb

## 2022-02-20 DIAGNOSIS — R232 Flushing: Secondary | ICD-10-CM

## 2022-02-20 NOTE — Assessment & Plan Note (Signed)
Likely related to menopause.  History of CVA precludes hormonal therapy.  Could consider citalopram or SNRI though patient does not want prescription medication at this time. - advised to try black cohosh supplement - unclear evidence for Embr bracelet, discussed that we can fill out financial assistance forms if there are any - she will discuss with her insurance company - check CBC and TSH to assess for infectious or thyroid imbalance

## 2022-02-21 ENCOUNTER — Encounter: Payer: Self-pay | Admitting: Internal Medicine

## 2022-02-21 LAB — CBC
Hematocrit: 35.7 % (ref 34.0–46.6)
Hemoglobin: 11.8 g/dL (ref 11.1–15.9)
MCH: 27.4 pg (ref 26.6–33.0)
MCHC: 33.1 g/dL (ref 31.5–35.7)
MCV: 83 fL (ref 79–97)
Platelets: 286 10*3/uL (ref 150–450)
RBC: 4.31 x10E6/uL (ref 3.77–5.28)
RDW: 13.1 % (ref 11.7–15.4)
WBC: 6.1 10*3/uL (ref 3.4–10.8)

## 2022-02-21 LAB — TSH RFX ON ABNORMAL TO FREE T4: TSH: 1.97 u[IU]/mL (ref 0.450–4.500)

## 2022-03-06 ENCOUNTER — Ambulatory Visit (AMBULATORY_SURGERY_CENTER): Payer: Self-pay | Admitting: *Deleted

## 2022-03-06 VITALS — Ht 61.0 in | Wt 181.0 lb

## 2022-03-06 DIAGNOSIS — Z1211 Encounter for screening for malignant neoplasm of colon: Secondary | ICD-10-CM

## 2022-03-06 MED ORDER — PEG 3350-KCL-NA BICARB-NACL 420 G PO SOLR: 4000.0000 mL | Freq: Once | ORAL | 0 refills | Status: AC

## 2022-03-06 NOTE — Progress Notes (Signed)
Patient is here in-person for PV. Patient denies any allergies to eggs or soy. Patient denies any problems with anesthesia/sedation. Patient is not on any oxygen at home. Patient is not taking any diet/weight loss medications or blood thinners. Went over procedure prep instructions with the patient. Patient is aware of our care-partner policy. Patient denies constipation. Pt uses Miralax daily.

## 2022-03-11 NOTE — Patient Instructions (Signed)
It was great to see you today! Thank you for choosing [facility] for your primary care. Megan Miller was seen for lower extremity swelling.  Today we addressed: In the meantime, use compression stockings and elevate the legs. Raise the injured area above the level of your heart while you are sitting or lying down. Do not sit still or stand for a long time. Do not wear tight clothes. Do not wear garters on your upper legs. Exercise your legs. This can help the swelling go down. Wear compression stockings as told by your doctor. It is important that these are the right size. These should be prescribed by your doctor to prevent possible injuries. If elastic bandages or wraps are recommended, use them as told by your doctor.  Trying the dash diet  The DASH diet is rich in vegetables, fruits and whole grains. It includes fat-free or low-fat dairy products, fish, poultry, beans and nuts. It limits foods that are high in saturated fat, such as fatty meats and full-fat dairy products. The DASH diet also limits salt, also called sodium, to between 1,500 and 2,300 milligrams a day. Here are the number of servings to eat from each food group for two calorie levels of the DASH diet. Following are examples of what a single serving is. Recommended number of servings Food group 1,600-calorie diet 2,000-calorie diet  Grains (mainly whole grains) 6 a day 6-8 a day  Vegetables 3-4 a day 4-5 a day  Fruits 4 a day 4-5 a day  Low-fat or fat-free milk and milk products 2-3 a day 2-3 a day  Lean meats, poultry and fish 3-4 one-ounce servings or fewer a day 6 one-ounce servings or fewer a day  Nuts, seeds and legumes 3-4 a week 4-5 a week  Fats and oils 2 a day 2-3 a day  Sweets and added sugars 3 or fewer a week 5 or fewer a week  Source: National Heart, Lung, and Blood Institute   If you haven't already, sign up for My Chart to have easy access to your labs results, and communication with your  primary care physician.  You should return to our clinic No follow-ups on file.  I recommend that you always bring your medications to each appointment as this makes it easy to ensure you are on the correct medications and helps [place] not miss refills when you need them.  Please arrive 15 minutes before your appointment to ensure smooth check in process.  We appreciate your efforts in making this happen.  Please call the clinic at [phone] if your symptoms worsen or you have any concerns.  Thank you for allowing me to participate in your care, Levin Erp, MD 03/11/2022, 10:00 PM PGY-2, [facility]

## 2022-03-11 NOTE — Progress Notes (Signed)
    SUBJECTIVE:   CHIEF COMPLAINT / HPI: leg swelling  For the past week patient has been having increased bilateral leg swelling. She says that the right leg behind knee is where it hurts most. Has been taking aspirin but no other pain control. No recent travel. No hemoptysis or shortness of breath at rest. Maybe a little bit more short of breath when walking but not when laying flat. No chest pain. No warmth or redness of her legs. Followed by Dr. Anne Fu with Cardiology. Has had DVT u/s in past negative for clots but did have cystic structure behind popliteal fossa on right. Echo this year with EF 60-65%.  PERTINENT  PMH / PSH: chronic RLE edema (Arthritic vs venous insufficiency)  OBJECTIVE:   BP (!) 144/70   Pulse 78   Ht 5\' 1"  (1.549 m)   Wt 184 lb 6.4 oz (83.6 kg)   SpO2 98%   BMI 34.84 kg/m   General: Well appearing, NAD, awake, alert, responsive to questions Head: Normocephalic atraumatic CV: Regular rate and rhythm no murmurs rubs or gallops Respiratory: Clear to ausculation bilaterally no increased work of breathing Abdomen: Soft, non-tender, non-distended Extremities: 1-2+ pitting edema in lower extremity bilaterally, some tenderness to palpation in popliteal fossa near varicosisties, no warmth or erythema      ASSESSMENT/PLAN:   Lower extremity edema The most likely diagnosis is venous insufficiency given bilateral edema chronically. I have considered and concluded the patient has a very low likelihood of heart failure (no orthopnea recent echo EF 60-65%) , anemia (normal CBC recently-hgb 11.8 last month) , cirrhosis (normal labs recently) , myxedema (normal TSH recently) , Medication (reviewed list, no culprits) , DVT (bilateral, no provoking factor, last DVT u/s this year no clots but cystic structure behind right knee) , pregnancy (age) , and pelvic mass (no abdominal symptoms) . Patient is breathing well on RA and is hemodynamically stable. Encouraged using  compression stockings and elevating legs with utilization of DASH diet if able, see AVS for more.    , MD [facility]

## 2022-03-12 ENCOUNTER — Ambulatory Visit (INDEPENDENT_AMBULATORY_CARE_PROVIDER_SITE_OTHER): Payer: Medicare Other | Admitting: Student

## 2022-03-12 VITALS — BP 144/70 | HR 78 | Ht 61.0 in | Wt 184.4 lb

## 2022-03-12 DIAGNOSIS — R600 Localized edema: Secondary | ICD-10-CM

## 2022-03-13 DIAGNOSIS — R600 Localized edema: Secondary | ICD-10-CM | POA: Insufficient documentation

## 2022-03-13 NOTE — Assessment & Plan Note (Signed)
The most likely diagnosis is venous insufficiency given bilateral edema chronically. I have considered and concluded the patient has a very low likelihood of heart failure (no orthopnea recent echo EF 60-65%) , anemia (normal CBC recently-hgb 11.8 last month) , cirrhosis (normal labs recently) , myxedema (normal TSH recently) , Medication (reviewed list, no culprits) , DVT (bilateral, no provoking factor, last DVT u/s this year no clots but cystic structure behind right knee) , pregnancy (age) , and pelvic mass (no abdominal symptoms) . Patient is breathing well on RA and is hemodynamically stable. Encouraged using compression stockings and elevating legs with utilization of DASH diet if able, see AVS for more.

## 2022-03-17 ENCOUNTER — Other Ambulatory Visit: Payer: Self-pay

## 2022-03-17 ENCOUNTER — Emergency Department (HOSPITAL_COMMUNITY)
Admission: EM | Admit: 2022-03-17 | Discharge: 2022-03-17 | Disposition: A | Discharge status: Home / Self Care | Payer: Medicare Other | Attending: Student | Admitting: Student

## 2022-03-17 ENCOUNTER — Encounter (HOSPITAL_COMMUNITY): Payer: Self-pay

## 2022-03-17 ENCOUNTER — Emergency Department (HOSPITAL_BASED_OUTPATIENT_CLINIC_OR_DEPARTMENT_OTHER): Payer: Medicare Other

## 2022-03-17 DIAGNOSIS — M7989 Other specified soft tissue disorders: Secondary | ICD-10-CM | POA: Insufficient documentation

## 2022-03-17 DIAGNOSIS — I10 Essential (primary) hypertension: Secondary | ICD-10-CM | POA: Insufficient documentation

## 2022-03-17 DIAGNOSIS — R52 Pain, unspecified: Secondary | ICD-10-CM

## 2022-03-17 DIAGNOSIS — R2243 Localized swelling, mass and lump, lower limb, bilateral: Secondary | ICD-10-CM | POA: Diagnosis not present

## 2022-03-17 DIAGNOSIS — R600 Localized edema: Secondary | ICD-10-CM | POA: Diagnosis not present

## 2022-03-17 DIAGNOSIS — Z8673 Personal history of transient ischemic attack (TIA), and cerebral infarction without residual deficits: Secondary | ICD-10-CM | POA: Insufficient documentation

## 2022-03-17 DIAGNOSIS — Z79899 Other long term (current) drug therapy: Secondary | ICD-10-CM | POA: Insufficient documentation

## 2022-03-17 DIAGNOSIS — R770 Abnormality of albumin: Secondary | ICD-10-CM | POA: Diagnosis not present

## 2022-03-17 DIAGNOSIS — Z7982 Long term (current) use of aspirin: Secondary | ICD-10-CM | POA: Diagnosis not present

## 2022-03-17 LAB — COMPREHENSIVE METABOLIC PANEL
ALT: 13 U/L (ref 0–44)
AST: 20 U/L (ref 15–41)
Albumin: 3.3 g/dL — ABNORMAL LOW (ref 3.5–5.0)
Alkaline Phosphatase: 86 U/L (ref 38–126)
Anion gap: 6 (ref 5–15)
BUN: 13 mg/dL (ref 8–23)
CO2: 24 mmol/L (ref 22–32)
Calcium: 8.9 mg/dL (ref 8.9–10.3)
Chloride: 111 mmol/L (ref 98–111)
Creatinine, Ser: 0.75 mg/dL (ref 0.44–1.00)
GFR, Estimated: >60 mL/min (ref >60)
Glucose, Bld: 115 mg/dL — ABNORMAL HIGH (ref 70–99)
Potassium: 3.5 mmol/L (ref 3.5–5.1)
Sodium: 141 mmol/L (ref 135–145)
Total Bilirubin: 0.7 mg/dL (ref 0.3–1.2)
Total Protein: 7.2 g/dL (ref 6.5–8.1)

## 2022-03-17 LAB — CBC
HCT: 36.5 % (ref 36.0–46.0)
Hemoglobin: 11.8 g/dL — ABNORMAL LOW (ref 12.0–15.0)
MCH: 28 pg (ref 26.0–34.0)
MCHC: 32.3 g/dL (ref 30.0–36.0)
MCV: 86.5 fL (ref 80.0–100.0)
Platelets: 263 10*3/uL (ref 150–400)
RBC: 4.22 MIL/uL (ref 3.87–5.11)
RDW: 14.1 % (ref 11.5–15.5)
WBC: 6.3 10*3/uL (ref 4.0–10.5)
nRBC: 0 % (ref 0.0–0.2)

## 2022-03-17 MED ORDER — POTASSIUM CHLORIDE ER 10 MEQ PO TBCR: 10.0000 meq | EXTENDED_RELEASE_TABLET | Freq: Every day | ORAL | 0 refills | Status: DC

## 2022-03-17 MED ORDER — FUROSEMIDE 20 MG PO TABS: 20.0000 mg | ORAL_TABLET | Freq: Every day | ORAL | 0 refills | Status: DC

## 2022-03-17 NOTE — Progress Notes (Signed)
VASCULAR LAB    Bilateral lower extremity venous duplex has been performed.  See CV proc for preliminary results.  Messaged negative results to Dr. Posey Rea via secure chat.  Vishwa Dais, RVT 03/17/2022, 6:10 PM

## 2022-03-17 NOTE — ED Notes (Signed)
EDP at BS 

## 2022-03-17 NOTE — ED Notes (Addendum)
Denies CP or sob. C/o BLE edema, R>L, also R leg pain behind knee and lateral calf. No heat, redness or bruising. Skin intact.

## 2022-03-17 NOTE — ED Triage Notes (Signed)
Patient complains of bilateral leg pain and swelling x 2 weeks. States that she saw her primary for same and they told her to take tylenol. No shortness or any other complaints

## 2022-03-17 NOTE — ED Provider Notes (Signed)
[facility] EMERGENCY DEPARTMENT Provider Note  CSN: [id number] Arrival date & time: 03/17/22 1321  Chief Complaint(s) No chief complaint on file.  HPI Megan Miller is a 66 y.o. female with PMH HTN, HLD, chronic lower extremity edema, previous CVA who presents emergency department for evaluation of bilateral lower extremity edema and leg pain.  Patient was evaluated by her family medicine physician on 03/12/2022 for this exact problem and is scheduled for an outpatient ultrasound but she states that she is still in pain and does not want to wait for the ultrasound.  The patient states that she does have compression stockings but does not like to wear them because they make her legs hurt.  She denies chest pain, shortness of breath, abdominal pain, nausea, vomiting or any other systemic symptoms.   Past Medical History Past Medical History:  Diagnosis Date   Allergic conjunctivitis    Arthritis    Chest pain    Chronic back pain    Constipation, chronic    Dyspnea on exertion    Dysthymic    GERD (gastroesophageal reflux disease)    Hyperlipidemia    Hypertension    Leg edema    Asymetric Right leg (dopplers negative)   Overweight (BMI 25.0-29.9)    Stroke (HCC) 2019   Urination frequency    Patient Active Problem List   Diagnosis Date Noted   Lower extremity edema 03/13/2022   Pure hypercholesterolemia 10/04/2021   Atypical chest pain 07/20/2021   Globus sensation 02/17/2021   Skin lesions 12/09/2020   Leg cramping 01/29/2020   Vestibular neuritis 09/25/2019   Primary osteoarthritis of right knee 07/09/2018   Chronic migraine without aura without status migrainosus, not intractable 11/13/2017   Lumbosacral radiculopathy 11/12/2017   Cerebrovascular accident (CVA) (HCC) 11/08/2017   Spinal stenosis of lumbar region 11/08/2017   Routine adult health maintenance 02/13/2016   Leg edema, right 01/27/2015   Other forms of angina pectoris (HCC)  09/23/2014   Hot flashes 11/27/2013   Depression 03/18/2013   MYOPIA 08/25/2009   CONSTIPATION, CHRONIC 02/10/2009   GERD 02/06/2008   HYPERTENSION, BENIGN 10/09/2006   Chronic low back pain 10/09/2006   Home Medication(s) Prior to Admission medications   Medication Sig Start Date End Date Taking? Authorizing Provider  furosemide (LASIX) 20 MG tablet Take 1 tablet (20 mg total) by mouth daily for 7 days. 03/17/22 03/24/22 Yes Dasia Guerrier, MD  potassium chloride (KLOR-CON) 10 MEQ tablet Take 1 tablet (10 mEq total) by mouth daily for 7 days. 03/17/22 03/24/22 Yes Tyquarius Paglia, MD  acetaminophen (TYLENOL 8 HOUR) 650 MG CR tablet Take 1 tablet (650 mg total) by mouth every 6 (six) hours as needed for pain. 02/24/21   Brimage, Vondra, DO  ACIPHEX 20 MG tablet TAKE 1 TABLET (20 MG TOTAL) BY MOUTH IN THE MORNING AND AT BEDTIME. 05/18/21   Esterwood, Amy S, PA-C  aspirin 81 MG tablet Take 1 tablet (81 mg total) by mouth daily. 09/23/14   Glori Luis, MD  atorvastatin (LIPITOR) 40 MG tablet TAKE 1 TABLET BY MOUTH EVERY DAY 08/18/21   Brimage, Seward Meth, DO  carvedilol (COREG) 3.125 MG tablet Take 1 tablet (3.125 mg total) by mouth 2 (two) times daily with a meal. 12/14/21   Brimage, Vondra, DO  cetirizine (ZYRTEC) 10 MG tablet Take 1 tablet (10 mg total) by mouth daily. 11/20/21   Brimage, Seward Meth, DO  diclofenac Sodium (VOLTAREN) 1 % GEL APPLY 2 GRAMS TO AFFECTED AREA 4  TIMES A DAY 07/13/21   Brimage, Seward Meth, DO  Emollient (EUCERIN) lotion Apply topically as needed for dry skin.    [provider]  famotidine (PEPCID) 40 MG tablet Take 1 tablet (40 mg total) by mouth at bedtime. 04/27/21   Esterwood, Amy S, PA-C  hydrOXYzine (ATARAX) 10 MG tablet Take 1 tablet (10 mg total) by mouth at bedtime as needed for itching. 11/20/21   Brimage, Seward Meth, DO  ipratropium (ATROVENT) 0.06 % nasal spray Place 2 sprays into both nostrils 4 (four) times daily. Patient taking differently: Place 2 sprays into  both nostrils as needed. 09/26/16 02/27/25  Bonney Aid, MD  naproxen (NAPROSYN) 500 MG tablet Take 1 tablet (500 mg total) by mouth 2 (two) times daily with a meal. 01/24/22   Brimage, Vondra, DO  olopatadine (PATANOL) 0.1 % ophthalmic solution Place 1 drop into both eyes 2 (two) times daily as needed (seasonal allergies/itching).     [provider]  pantoprazole (PROTONIX) 40 MG tablet Take 40 mg by mouth daily. 06/29/21   [provider]  polyethylene glycol powder (GLYCOLAX/MIRALAX) 17 GM/SCOOP powder Dissolve 17grams in at at least 32 ounces of water or juice daily as needed. 09/21/21   [name], DO  senna (SENOKOT) 8.6 MG TABS tablet Take 2 tablets (17.2 mg total) by mouth daily as needed for mild constipation. 09/21/21   [name], DO  Skin Protectants, Misc. (EUCERIN) cream Apply topically as needed for dry skin.    [provider]  Soap & Cleansers (NEUTROGENA FACIAL SOAP) BAR Apply 1 each topically in the morning and at bedtime. 11/20/21   [name], DO  triamcinolone ointment (KENALOG) 0.1 % Apply 1 Application topically 2 (two) times daily. 02/13/22   Levin Erp, MD  XIIDRA 5 % SOLN Apply 1 drop to eye 2 (two) times daily. 01/13/22   [provider]                                                                                                                                    Past Surgical History Past Surgical History:  Procedure Laterality Date   ABDOMINAL HYSTERECTOMY  09/28/1999   for leimyomas   BILATERAL SALPINGOOPHORECTOMY  05/08/2001   Path=extensive endometriosis   BREAST SURGERY  12/29/2003   Right breast core biopsy=benign fibrocystic change   COLONOSCOPY  08/23/2011   Dr.Magod   SMALL INTESTINE SURGERY  03/30/2001   Rectosigmoid bowel resection and reanastomosis   UPPER GASTROINTESTINAL ENDOSCOPY     Family History Family History  Problem Relation Age of Onset   Hypertension Mother    Diabetes Father     Diabetes Sister    Asthma Brother    Diabetes Brother    Kidney disease Brother    Asthma Brother    Diabetes Brother    Colon cancer Neg Hx    Colon polyps Neg Hx    Esophageal cancer Neg  Hx    Breast cancer Neg Hx    Pancreatic cancer Neg Hx    Stomach cancer Neg Hx    Rectal cancer Neg Hx     Social History Social History   Tobacco Use   Smoking status: Never   Smokeless tobacco: Never  Vaping Use   Vaping Use: Never used  Substance Use Topics   Alcohol use: Not Currently    Comment: 1 drink very rarely   Drug use: No   Allergies Percocet [oxycodone-acetaminophen] and Codeine phosphate  Review of Systems Review of Systems  Cardiovascular:  Positive for leg swelling.    Physical Exam Vital Signs  I have reviewed the triage vital signs BP (!) 168/82 (BP Location: Left Arm)   Pulse 65   Temp 97.9 F (36.6 C)   Resp 16   SpO2 100%   Physical Exam Vitals and nursing note reviewed.  Constitutional:      General: She is not in acute distress.    Appearance: She is well-developed.  HENT:     Head: Normocephalic and atraumatic.  Eyes:     Conjunctiva/sclera: Conjunctivae normal.  Cardiovascular:     Rate and Rhythm: Normal rate and regular rhythm.     Heart sounds: No murmur heard. Pulmonary:     Effort: Pulmonary effort is normal. No respiratory distress.     Breath sounds: Normal breath sounds.  Abdominal:     Palpations: Abdomen is soft.     Tenderness: There is no abdominal tenderness.  Musculoskeletal:        General: No swelling.     Cervical back: Neck supple.     Right lower leg: Edema present.     Left lower leg: Edema present.  Skin:    General: Skin is warm and dry.     Capillary Refill: Capillary refill takes less than 2 seconds.  Neurological:     Mental Status: She is alert.  Psychiatric:        Mood and Affect: Mood normal.     ED Results and Treatments Labs (all labs ordered are listed, but only abnormal results are  displayed) Labs Reviewed  COMPREHENSIVE METABOLIC PANEL - Abnormal; Notable for the following components:      Result Value   Glucose, Bld 115 (*)    Albumin 3.3 (*)    All other components within normal limits  CBC - Abnormal; Notable for the following components:   Hemoglobin 11.8 (*)    All other components within normal limits                                                                                                                          Radiology VAS [place] LOWER EXTREMITY VENOUS (DVT) (7a-7p)  Result Date: 03/17/2022  Lower Venous DVT Study Patient Name:  ANNALIA LEZON  Date of Exam:   03/17/2022 Medical Rec #: [id number]           Accession #:  [id number] Date of Birth: 08/17/1955           Patient Gender: F Patient Age:   10 years Exam Location:  [facility] Procedure:      VAS [place] LOWER EXTREMITY VENOUS (DVT) Referring Phys: Jermain Curt [facility] --------------------------------------------------------------------------------  Indications: Pain, and Swelling.  Comparison Study: Prior negative bilateral LEV done 08/2021. Popliteal cyst found                   in right popliteal fossa. Performing Technologist: Sherren Kerns RVS  Examination Guidelines: A complete evaluation includes B-mode imaging, spectral Doppler, color Doppler, and power Doppler as needed of all accessible portions of each vessel. Bilateral testing is considered an integral part of a complete examination. Limited examinations for reoccurring indications may be performed as noted. The reflux portion of the exam is performed with the patient in reverse Trendelenburg.  +---------+---------------+---------+-----------+----------+--------------+ RIGHT    CompressibilityPhasicitySpontaneityPropertiesThrombus Aging +---------+---------------+---------+-----------+----------+--------------+ CFV      Full           Yes      Yes                                  +---------+---------------+---------+-----------+----------+--------------+ SFJ      Full                                                        +---------+---------------+---------+-----------+----------+--------------+ FV Prox  Full                                                        +---------+---------------+---------+-----------+----------+--------------+ FV Mid   Full                                                        +---------+---------------+---------+-----------+----------+--------------+ FV DistalFull                                                        +---------+---------------+---------+-----------+----------+--------------+ PFV      Full                                                        +---------+---------------+---------+-----------+----------+--------------+ POP      Full           Yes      Yes                                 +---------+---------------+---------+-----------+----------+--------------+ PTV      Full                                                        +---------+---------------+---------+-----------+----------+--------------+  PERO     Full                                                        +---------+---------------+---------+-----------+----------+--------------+ Gastroc  Full                                                        +---------+---------------+---------+-----------+----------+--------------+   +---------+---------------+---------+-----------+----------+--------------+ LEFT     CompressibilityPhasicitySpontaneityPropertiesThrombus Aging +---------+---------------+---------+-----------+----------+--------------+ CFV      Full           Yes      Yes                                 +---------+---------------+---------+-----------+----------+--------------+ SFJ      Full                                                         +---------+---------------+---------+-----------+----------+--------------+ FV Prox  Full                                                        +---------+---------------+---------+-----------+----------+--------------+ FV Mid   Full                                                        +---------+---------------+---------+-----------+----------+--------------+ FV DistalFull                                                        +---------+---------------+---------+-----------+----------+--------------+ PFV      Full                                                        +---------+---------------+---------+-----------+----------+--------------+ POP      Full           Yes      Yes                                 +---------+---------------+---------+-----------+----------+--------------+ PTV      Full                                                        +---------+---------------+---------+-----------+----------+--------------+  PERO     Full                                                        +---------+---------------+---------+-----------+----------+--------------+    Summary: BILATERAL: - No evidence of deep vein thrombosis seen in the lower extremities, bilaterally. - RIGHT: - The cystic structure found in the popliteal fossa 08/2021 remains present and intact.  LEFT: - No cystic structure found in the popliteal fossa.  *See table(s) above for measurements and observations.    Preliminary     Pertinent labs & imaging results that were available during my care of the patient were reviewed by me and considered in my medical decision making (see MDM for details).  Medications Ordered in ED Medications - No data to display                                                                                                                                   Procedures Procedures  (including critical care time)  Medical Decision Making / ED Course   This  patient presents to the ED for concern of lower extremity edema, this involves an extensive number of treatment options, and is a complaint that carries with it a high risk of complications and morbidity.  The differential diagnosis includes CHF, chronic venous insufficiency, gravity dependent edema, DVT, Baker's cyst  MDM: Patient seen emergency room for evaluation of lower extremity edema and leg pain.  Physical exam with bilateral lower extremity edema but is otherwise unremarkable.  Laboratory evaluation with some mild hypoalbuminemia to 3.3, hemoglobin 11.8 but is otherwise unremarkable.  Bilateral DVT ultrasound negative for DVT and there is a small Baker's cyst on the right which is consistent with previous ultrasounds.  Patient encouraged to use the stockings provided for her here in the emergency department and will be trialed on a short 1 week course of Lasix with appropriate potassium supplementation.  Patient then discharged.   Additional history obtained:  -External records from outside source obtained and reviewed including: Chart review including previous notes, labs, imaging, consultation notes   Lab Tests: -I ordered, reviewed, and interpreted labs.   The pertinent results include:   Labs Reviewed  COMPREHENSIVE METABOLIC PANEL - Abnormal; Notable for the following components:      Result Value   Glucose, Bld 115 (*)    Albumin 3.3 (*)    All other components within normal limits  CBC - Abnormal; Notable for the following components:   Hemoglobin 11.8 (*)    All other components within normal limits       Imaging Studies ordered: I ordered imaging studies including DVT ultrasound I independently visualized and interpreted imaging. I agree with the radiologist interpretation   Medicines ordered and prescription  drug management: Meds ordered this encounter  Medications   furosemide (LASIX) 20 MG tablet    Sig: Take 1 tablet (20 mg total) by mouth daily for 7 days.     Dispense:  7 tablet    Refill:  0   potassium chloride (KLOR-CON) 10 MEQ tablet    Sig: Take 1 tablet (10 mEq total) by mouth daily for 7 days.    Dispense:  7 tablet    Refill:  0    -I have reviewed the patients home medicines and have made adjustments as needed  Critical interventions none  Social Determinants of Health:  Factors impacting patients care include: none   Reevaluation: After the interventions noted above, I reevaluated the patient and found that they have :stayed the same  Co morbidities that complicate the patient evaluation  Past Medical History:  Diagnosis Date   Allergic conjunctivitis    Arthritis    Chest pain    Chronic back pain    Constipation, chronic    Dyspnea on exertion    Dysthymic    GERD (gastroesophageal reflux disease)    Hyperlipidemia    Hypertension    Leg edema    Asymetric Right leg (dopplers negative)   Overweight (BMI 25.0-29.9)    Stroke [facility]) 2019   Urination frequency       Dispostion: I considered admission for this patient, but she does not meet inpatient criteria for admission is safe for discharge with outpatient follow-up     Final Clinical Impression(s) / ED Diagnoses Final diagnoses:  Leg swelling     @PCDICTATION @    Glendora Score, MD 03/17/22 2128

## 2022-03-17 NOTE — ED Notes (Signed)
Pt to vasc doppler US

## 2022-03-17 NOTE — ED Notes (Signed)
Labs, VS, hx reviewed. Pt not in bed.

## 2022-03-21 ENCOUNTER — Other Ambulatory Visit: Payer: Self-pay | Admitting: Student

## 2022-03-21 ENCOUNTER — Other Ambulatory Visit: Payer: Self-pay | Admitting: Family Medicine

## 2022-03-21 DIAGNOSIS — R21 Rash and other nonspecific skin eruption: Secondary | ICD-10-CM

## 2022-03-29 ENCOUNTER — Encounter: Payer: Medicare Other | Admitting: Internal Medicine

## 2022-04-06 ENCOUNTER — Other Ambulatory Visit: Payer: Self-pay | Admitting: Physician Assistant

## 2022-04-08 ENCOUNTER — Encounter: Payer: Self-pay | Admitting: Certified Registered Nurse Anesthetist

## 2022-04-10 ENCOUNTER — Telehealth: Payer: Self-pay

## 2022-04-10 NOTE — Telephone Encounter (Signed)
Patient calls nurse line regarding bilateral leg pain. Reports that this has been going on since stroke (2019), however, has gotten worse over the last couple weeks. She has also noticed swelling in her feet. She was seen in the ED on 8/19 for swelling in her right leg. DVT study negative. She has been using compression stockings.   Denies shortness of breath, difficulty breathing or chest pain. Denies recent weight gain.   Patient is asking if she can take ibuprofen to help with leg pain. She has been taking tylenol which works short term, but pain returns. Per chart review, she has been told to avoid ibuprofen if possible. Will defer to PCP for further medication management.   Patient is also asking about hot flash bracelet or ring. I checked with insurance companies, these are not devices that are covered through insurance. Provided patient with this update.   Scheduled patient follow up visit with PCP on 9/29. Offered to schedule for next available clinic appointment, patient declined. Provided with ED precautions.   Veronda Prude, RN

## 2022-04-12 ENCOUNTER — Other Ambulatory Visit: Payer: Self-pay | Admitting: Student

## 2022-04-12 DIAGNOSIS — M545 Other chronic pain: Secondary | ICD-10-CM

## 2022-04-12 MED ORDER — DICLOFENAC SODIUM 1 % EX GEL: CUTANEOUS | 1 refills | Status: DC

## 2022-04-12 NOTE — Telephone Encounter (Signed)
Called patient. She did not answer, LVM asking patient to return call to office to provide with update from Dr. Laroy Apple.   Veronda Prude, RN

## 2022-04-13 ENCOUNTER — Encounter: Payer: Self-pay | Admitting: Internal Medicine

## 2022-04-13 ENCOUNTER — Ambulatory Visit (AMBULATORY_SURGERY_CENTER): Payer: Medicare Other | Admitting: Internal Medicine

## 2022-04-13 VITALS — BP 189/109 | HR 87 | Temp 97.8°F | Resp 20 | Ht 61.0 in | Wt 181.0 lb

## 2022-04-13 DIAGNOSIS — Z538 Procedure and treatment not carried out for other reasons: Secondary | ICD-10-CM | POA: Diagnosis not present

## 2022-04-13 DIAGNOSIS — Z1211 Encounter for screening for malignant neoplasm of colon: Secondary | ICD-10-CM

## 2022-04-13 MED ORDER — SODIUM CHLORIDE 0.9 % IV SOLN: 500.0000 mL | Freq: Once | INTRAVENOUS | Status: DC

## 2022-04-13 NOTE — Progress Notes (Signed)
GASTROENTEROLOGY PROCEDURE H&P NOTE   Primary Care Physician: Gerrit Heck, MD    Reason for Procedure:  Colon cancer screening  Plan:    Colonoscopy  Patient is appropriate for endoscopic procedure(s) in the ambulatory ([place]) setting.  The nature of the procedure, as well as the risks, benefits, and alternatives were carefully and thoroughly reviewed with the patient. Ample time for discussion and questions allowed. The patient understood, was satisfied, and agreed to proceed.     HPI: Megan Miller is a 66 y.o. female who presents for screening colonoscopy.  Medical history as below.  Tolerated the prep.  No recent chest pain or shortness of breath.  No abdominal pain today.  Past Medical History:  Diagnosis Date   Allergic conjunctivitis    Arthritis    Chest pain    Chronic back pain    Constipation, chronic    Dyspnea on exertion    Dysthymic    GERD (gastroesophageal reflux disease)    Hyperlipidemia    Hypertension    Leg edema    Asymetric Right leg (dopplers negative)   Overweight (BMI 25.0-29.9)    Stroke ([place]) 2019   Urination frequency     Past Surgical History:  Procedure Laterality Date   ABDOMINAL HYSTERECTOMY  09/28/1999   for leimyomas   BILATERAL SALPINGOOPHORECTOMY  05/08/2001   Path=extensive endometriosis   BREAST SURGERY  12/29/2003   Right breast core biopsy=benign fibrocystic change   COLONOSCOPY  08/23/2011   Dr.Magod   SMALL INTESTINE SURGERY  03/30/2001   Rectosigmoid bowel resection and reanastomosis   UPPER GASTROINTESTINAL ENDOSCOPY      Prior to Admission medications   Medication Sig Start Date End Date Taking? Authorizing Provider  acetaminophen (TYLENOL 8 HOUR) 650 MG CR tablet Take 1 tablet (650 mg total) by mouth every 6 (six) hours as needed for pain. 02/24/21  Yes Brimage, Vondra, DO  ACIPHEX 20 MG tablet TAKE 1 TABLET (20 MG TOTAL) BY MOUTH IN THE MORNING AND AT BEDTIME. 05/18/21  Yes Esterwood, Amy S, PA-C   aspirin 81 MG tablet Take 1 tablet (81 mg total) by mouth daily. 09/23/14  Yes Leone Haven, MD  atorvastatin (LIPITOR) 40 MG tablet TAKE 1 TABLET BY MOUTH EVERY DAY 08/18/21  Yes Brimage, Vondra, DO  carvedilol (COREG) 3.125 MG tablet TAKE 1 TABLET BY MOUTH TWICE A DAY WITH A MEAL 03/22/22  Yes Gerrit Heck, MD  cetirizine (ZYRTEC) 10 MG tablet Take 1 tablet (10 mg total) by mouth daily. 11/20/21  Yes Brimage, Vondra, DO  diclofenac Sodium (VOLTAREN) 1 % GEL APPLY 2 GRAMS TO AFFECTED AREA 4 TIMES A DAY 04/12/22  Yes Gerrit Heck, MD  Emollient (EUCERIN) lotion Apply topically as needed for dry skin.   Yes [provider]  naproxen (NAPROSYN) 500 MG tablet Take 1 tablet (500 mg total) by mouth 2 (two) times daily with a meal. 01/24/22  Yes Brimage, Vondra, DO  olopatadine (PATANOL) 0.1 % ophthalmic solution Place 1 drop into both eyes 2 (two) times daily as needed (seasonal allergies/itching).    Yes [provider]  polyethylene glycol powder (GLYCOLAX/MIRALAX) 17 GM/SCOOP powder Dissolve 17grams in at at least 32 ounces of water or juice daily as needed. 09/21/21  Yes Brimage, Vondra, DO  senna (SENOKOT) 8.6 MG TABS tablet Take 2 tablets (17.2 mg total) by mouth daily as needed for mild constipation. 09/21/21  Yes Brimage, Ronnette Juniper, DO  Skin Protectants, Misc. (EUCERIN) cream Apply topically as needed for  dry skin.   Yes [provider]  Soap & Cleansers (NEUTROGENA FACIAL SOAP) BAR Apply 1 each topically in the morning and at bedtime. 11/20/21  Yes Brimage, Vondra, DO  XIIDRA 5 % SOLN Apply 1 drop to eye 2 (two) times daily. 01/13/22  Yes [provider]  famotidine (PEPCID) 40 MG tablet TAKE 1 TABLET BY MOUTH EVERYDAY AT BEDTIME 04/06/22   Esterwood, Amy S, PA-C  furosemide (LASIX) 20 MG tablet Take 1 tablet (20 mg total) by mouth daily for 7 days. 03/17/22 03/24/22  Kommor, Madison, MD  hydrOXYzine (ATARAX) 10 MG tablet Take 1 tablet (10 mg total) by mouth at  bedtime as needed for itching. 11/20/21   Brimage, Seward Meth, DO  ipratropium (ATROVENT) 0.06 % nasal spray Place 2 sprays into both nostrils 4 (four) times daily. Patient taking differently: Place 2 sprays into both nostrils as needed. 09/26/16 02/27/25  Bonney Aid, MD  potassium chloride (KLOR-CON) 10 MEQ tablet Take 1 tablet (10 mEq total) by mouth daily for 7 days. 03/17/22 03/24/22  Kommor, Madison, MD  triamcinolone ointment (KENALOG) 0.1 % APPLY TO AFFECTED AREA TWICE A DAY 03/22/22   Levin Erp, MD    Current Outpatient Medications  Medication Sig Dispense Refill   acetaminophen (TYLENOL 8 HOUR) 650 MG CR tablet Take 1 tablet (650 mg total) by mouth every 6 (six) hours as needed for pain. 60 tablet 0   ACIPHEX 20 MG tablet TAKE 1 TABLET (20 MG TOTAL) BY MOUTH IN THE MORNING AND AT BEDTIME. 60 tablet 11   aspirin 81 MG tablet Take 1 tablet (81 mg total) by mouth daily. 90 tablet 3   atorvastatin (LIPITOR) 40 MG tablet TAKE 1 TABLET BY MOUTH EVERY DAY 90 tablet 3   carvedilol (COREG) 3.125 MG tablet TAKE 1 TABLET BY MOUTH TWICE A DAY WITH A MEAL 180 tablet 0   cetirizine (ZYRTEC) 10 MG tablet Take 1 tablet (10 mg total) by mouth daily. 30 tablet 3   diclofenac Sodium (VOLTAREN) 1 % GEL APPLY 2 GRAMS TO AFFECTED AREA 4 TIMES A DAY 300 g 1   Emollient (EUCERIN) lotion Apply topically as needed for dry skin.     naproxen (NAPROSYN) 500 MG tablet Take 1 tablet (500 mg total) by mouth 2 (two) times daily with a meal. 30 tablet 0   olopatadine (PATANOL) 0.1 % ophthalmic solution Place 1 drop into both eyes 2 (two) times daily as needed (seasonal allergies/itching).      polyethylene glycol powder (GLYCOLAX/MIRALAX) 17 GM/SCOOP powder Dissolve 17grams in at at least 32 ounces of water or juice daily as needed. 255 g 0   senna (SENOKOT) 8.6 MG TABS tablet Take 2 tablets (17.2 mg total) by mouth daily as needed for mild constipation. 30 tablet 0   Skin Protectants, Misc. (EUCERIN) cream Apply  topically as needed for dry skin.     Soap & Cleansers (NEUTROGENA FACIAL SOAP) BAR Apply 1 each topically in the morning and at bedtime. 1 each 2   XIIDRA 5 % SOLN Apply 1 drop to eye 2 (two) times daily.     famotidine (PEPCID) 40 MG tablet TAKE 1 TABLET BY MOUTH EVERYDAY AT BEDTIME 90 tablet 2   furosemide (LASIX) 20 MG tablet Take 1 tablet (20 mg total) by mouth daily for 7 days. 7 tablet 0   hydrOXYzine (ATARAX) 10 MG tablet Take 1 tablet (10 mg total) by mouth at bedtime as needed for itching. 10 tablet 0  ipratropium (ATROVENT) 0.06 % nasal spray Place 2 sprays into both nostrils 4 (four) times daily. (Patient taking differently: Place 2 sprays into both nostrils as needed.) 15 mL 0   potassium chloride (KLOR-CON) 10 MEQ tablet Take 1 tablet (10 mEq total) by mouth daily for 7 days. 7 tablet 0   triamcinolone ointment (KENALOG) 0.1 % APPLY TO AFFECTED AREA TWICE A DAY 30 g 0   Current Facility-Administered Medications  Medication Dose Route Frequency Provider Last Rate Last Admin   0.9 %  sodium chloride infusion  500 mL Intravenous Once Marcas Bowsher, Carie Caddy, MD        Allergies as of 04/13/2022 - Review Complete 04/13/2022  Allergen Reaction Noted   Percocet [oxycodone-acetaminophen] Nausea And Vomiting 08/10/2011   Codeine phosphate Itching 06/28/2009    Family History  Problem Relation Age of Onset   Hypertension Mother    Diabetes Father    Diabetes Sister    Asthma Brother    Diabetes Brother    Kidney disease Brother    Asthma Brother    Diabetes Brother    Colon cancer Neg Hx    Colon polyps Neg Hx    Esophageal cancer Neg Hx    Breast cancer Neg Hx    Pancreatic cancer Neg Hx    Stomach cancer Neg Hx    Rectal cancer Neg Hx     Social History   Socioeconomic History   Marital status: Divorced    Spouse name: Not on file   Number of children: 0   Years of education: 12   Highest education level: 12th grade  Occupational History   Occupation: Disability   Tobacco Use   Smoking status: Never   Smokeless tobacco: Never  Vaping Use   Vaping Use: Never used  Substance and Sexual Activity   Alcohol use: Not Currently    Comment: 1 drink very rarely   Drug use: No   Sexual activity: Not Currently    Birth control/protection: Abstinence  Other Topics Concern   Not on file  Social History Narrative   Patient lives alone. Patient does not have any animals.    Social Determinants of Health   Financial Resource Strain: Low Risk  (03/17/2021)   Overall Financial Resource Strain (CARDIA)    Difficulty of Paying Living Expenses: Not hard at all  Food Insecurity: No Food Insecurity (03/17/2021)   Hunger Vital Sign    Worried About Running Out of Food in the Last Year: Never true    Ran Out of Food in the Last Year: Never true  Transportation Needs: No Transportation Needs (03/17/2021)   PRAPARE - Administrator, Civil Service (Medical): No    Lack of Transportation (Non-Medical): No  Physical Activity: Inactive (03/17/2021)   Exercise Vital Sign    Days of Exercise per Week: 0 days    Minutes of Exercise per Session: 0 min  Stress: Stress Concern Present (03/17/2021)   [employer] of Occupational Health - Occupational Stress Questionnaire    Feeling of Stress : To some extent  Social Connections: Moderately Isolated (03/17/2021)   Social Connection and Isolation Panel [NHANES]    Frequency of Communication with Friends and Family: More than three times a week    Frequency of Social Gatherings with Friends and Family: Three times a week    Attends Religious Services: More than 4 times per year    Active Member of Clubs or Organizations: No    Attends Club or  Organization Meetings: Never    Marital Status: Divorced  Human resources officer Violence: Not At Risk (03/17/2021)   Humiliation, Afraid, Rape, and Kick questionnaire    Fear of Current or Ex-Partner: No    Emotionally Abused: No    Physically Abused: No    Sexually Abused:  No    Physical Exam: Vital signs in last 24 hours: @BP  (!) 172/90   Pulse 79   Temp 97.8 F (36.6 C) (Temporal)   Ht 5\' 1"  (1.549 m)   Wt 181 lb (82.1 kg)   SpO2 98%   BMI 34.20 kg/m  GEN: NAD EYE: Sclerae anicteric ENT: MMM CV: Non-tachycardic Pulm: CTA b/l GI: Soft, NT/ND NEURO:  Alert & Oriented x 3   Zenovia Jarred, MD [facility]  04/13/2022 2:52 PM

## 2022-04-13 NOTE — Progress Notes (Signed)
Pt's states no medical or surgical changes since previsit or office visit. 

## 2022-04-13 NOTE — Op Note (Signed)
[facility] Patient Name: Megan Miller Procedure Date: 04/13/2022 3:01 PM MRN: [id number] Endoscopist: Jerene Bears , MD Age: 66 Referring MD:  Date of Birth: 1956-06-27 Gender: Female Account #: [id number] Procedure:                Colonoscopy Indications:              Screening for colorectal malignant neoplasm, Last                            colonoscopy 10 years ago Medicines:                Monitored Anesthesia Care Procedure:                Pre-Anesthesia Assessment:                           - Prior to the procedure, a History and Physical                            was performed, and patient medications and                            allergies were reviewed. The patient's tolerance of                            previous anesthesia was also reviewed. The risks                            and benefits of the procedure and the sedation                            options and risks were discussed with the patient.                            All questions were answered, and informed consent                            was obtained. Prior Anticoagulants: The patient has                            taken no previous anticoagulant or antiplatelet                            agents. ASA Grade Assessment: III - A patient with                            severe systemic disease. After reviewing the risks                            and benefits, the patient was deemed in                            satisfactory condition to undergo the procedure.  After obtaining informed consent, the colonoscope                            was passed under direct vision. Throughout the                            procedure, the patient's blood pressure, pulse, and                            oxygen saturations were monitored continuously. The                            CF [id number] #[id number] was introduced through the anus                            with the intention of  advancing to the cecum. The                            scope was advanced to the sigmoid colon before the                            procedure was aborted. Medications were given. The                            colonoscopy was performed with difficulty due to                            inadequate bowel prep. The patient tolerated the                            procedure well. The quality of the bowel                            preparation was poor. The rectum was photographed. Scope In: 3:08:12 PM Scope Out: 3:10:02 PM Total Procedure Duration: 0 hours 1 minute 50 seconds  Findings:                 The digital rectal exam was normal.                           A large amount of semi-solid and solid stool was                            found in the rectum and in the sigmoid colon,                            precluding visualization.                           There was evidence of a prior end-to-side                            colo-colonic anastomosis in the sigmoid colon. This  was patent.                           Retroflexion in the rectum was not performed due to                            aborting the procedure before completion. Complications:            No immediate complications. Estimated Blood Loss:     Estimated blood loss: none. Impression:               - Preparation of the colon was poor.                           - Stool in the rectum and in the sigmoid colon.                           - Patent end-to-side colo-colonic anastomosis.                           - No specimens collected. Recommendation:           - Patient has a contact number available for                            emergencies. The signs and symptoms of potential                            delayed complications were discussed with the                            patient. Return to normal activities tomorrow.                            Written discharge instructions were provided to the                             patient.                           - Resume previous diet.                           - Continue present medications.                           - Repeat colonoscopy with 2 day bowel preparation                            because the bowel preparation was suboptimal. Beverley Fiedler, MD 04/13/2022 3:13:17 PM This report has been signed electronically.

## 2022-04-13 NOTE — Patient Instructions (Signed)
Please read handouts provided. Continue present medications.   YOU HAD AN ENDOSCOPIC PROCEDURE TODAY AT THE [facility]:   Refer to the procedure report that was given to you for any specific questions about what was found during the examination.  If the procedure report does not answer your questions, please call your gastroenterologist to clarify.  If you requested that your care partner not be given the details of your procedure findings, then the procedure report has been included in a sealed envelope for you to review at your convenience later.  YOU SHOULD EXPECT: Some feelings of bloating in the abdomen. Passage of more gas than usual.  Walking can help get rid of the air that was put into your GI tract during the procedure and reduce the bloating. If you had a lower endoscopy (such as a colonoscopy or flexible sigmoidoscopy) you may notice spotting of blood in your stool or on the toilet paper. If you underwent a bowel prep for your procedure, you may not have a normal bowel movement for a few days.  Please Note:  You might notice some irritation and congestion in your nose or some drainage.  This is from the oxygen used during your procedure.  There is no need for concern and it should clear up in a day or so.  SYMPTOMS TO REPORT IMMEDIATELY:  Following lower endoscopy (colonoscopy or flexible sigmoidoscopy):  Excessive amounts of blood in the stool  Significant tenderness or worsening of abdominal pains  Swelling of the abdomen that is new, acute  Fever of 100F or higher  For urgent or emergent issues, a gastroenterologist can be reached at any hour by calling [phone]. Do not use MyChart messaging for urgent concerns.    DIET:  We do recommend a small meal at first, but then you may proceed to your regular diet.  Drink plenty of fluids but you should avoid alcoholic beverages for 24 hours.  ACTIVITY:  You should plan to take it easy for the rest of today and you  should NOT DRIVE or use heavy machinery until tomorrow (because of the sedation medicines used during the test).    FOLLOW UP: Our staff will call the number listed on your records the next business day following your procedure.  We will call around 7:15- 8:00 am to check on you and address any questions or concerns that you may have regarding the information given to you following your procedure. If we do not reach you, we will leave a message.     If any biopsies were taken you will be contacted by phone or by letter within the next 1-3 weeks.  Please call us at [phone] if you have not heard about the biopsies in 3 weeks.    SIGNATURES/CONFIDENTIALITY: You and/or your care partner have signed paperwork which will be entered into your electronic medical record.  These signatures attest to the fact that that the information above on your After Visit Summary has been reviewed and is understood.  Full responsibility of the confidentiality of this discharge information lies with you and/or your care-partner. 

## 2022-04-13 NOTE — Progress Notes (Signed)
Report given to PACU, vss 

## 2022-04-13 NOTE — Progress Notes (Signed)
1505 BP 170/91, Labetalol given IV, MD update, vss

## 2022-04-16 ENCOUNTER — Telehealth: Payer: Self-pay

## 2022-04-16 NOTE — Telephone Encounter (Signed)
Follow up call to pt, lm for pt to call if having any difficulty with normal activities or eating and drinking.  Also to call if any other questions or concerns.  

## 2022-04-27 ENCOUNTER — Telehealth: Payer: Self-pay | Admitting: *Deleted

## 2022-04-27 ENCOUNTER — Ambulatory Visit (INDEPENDENT_AMBULATORY_CARE_PROVIDER_SITE_OTHER): Payer: Medicare Other | Admitting: Student

## 2022-04-27 ENCOUNTER — Encounter: Payer: Self-pay | Admitting: Student

## 2022-04-27 VITALS — BP 138/74 | HR 79 | Ht 61.0 in | Wt 181.5 lb

## 2022-04-27 DIAGNOSIS — I10 Essential (primary) hypertension: Secondary | ICD-10-CM | POA: Diagnosis not present

## 2022-04-27 DIAGNOSIS — M79604 Pain in right leg: Secondary | ICD-10-CM

## 2022-04-27 DIAGNOSIS — M79605 Pain in left leg: Secondary | ICD-10-CM | POA: Diagnosis not present

## 2022-04-27 DIAGNOSIS — L989 Disorder of the skin and subcutaneous tissue, unspecified: Secondary | ICD-10-CM | POA: Diagnosis not present

## 2022-04-27 DIAGNOSIS — R600 Localized edema: Secondary | ICD-10-CM

## 2022-04-27 NOTE — Telephone Encounter (Signed)
-----   Message from Gerrit Heck, MD sent at 04/27/2022  1:26 PM EDT ----- She didn't like them apparently ----- Message ----- From: Valerie Roys, CMA Sent: 04/27/2022   1:25 PM EDT To: Gerrit Heck, MD  Patient just saw [facility] on 04/13/22... is there a reason she is wanting to change offices now?  ----- Message ----- From: Gerrit Heck, MD Sent: 04/27/2022   1:20 PM EDT To: Valerie Roys, CMA  This patient was interested in [facility] instead of [facility]. Is there anything she needs to do?

## 2022-04-27 NOTE — Assessment & Plan Note (Signed)
We discussed that this is most likely related to venous insufficiency.  Patient would like to see specialist about her legs as well.  I thought it was reasonable to refer her to sports medicine for evaluation as well. -Ambulatory referral to sports medicine -Continue Voltaren gel -DME order for compression stockings put in

## 2022-04-27 NOTE — Assessment & Plan Note (Signed)
On manual recheck was 138/74. -Continue carvedilol 3.125 twice daily

## 2022-04-27 NOTE — Assessment & Plan Note (Signed)
Does appear to be pruritic nodules likely from over excoriation of the area.  No active vesicles or papules seen. We discussed in the meantime before dermatologist appointment to apply triamcinolone twice a day every day.  We discussed the importance of barrier cream such as Vaseline and anytime that Megan Miller feels itchy to use that.  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 -Triamcinolone twice daily -Vaseline often -Dermatology referral placed

## 2022-04-27 NOTE — Telephone Encounter (Signed)
LM for patient to call back.  Please advise patient to ask about changing doctors at [facility] first before moving to another office.  She recently saw them for her colonoscopy and has her follow up in the coming weeks.  Megan Miller will prefer she stay with her current provider.  Tulio Facundo,CMA

## 2022-04-27 NOTE — Patient Instructions (Addendum)
It was great to see you! Thank you for allowing me to participate in your care!   Our plans for today:  - We can refer you to sports medicine for your leg pain/bakers cyst - I will try to put a DME order for compression stockings - I can refer you to dermatology-please do triamcinolone twice a day in the meantime and use Vaseline or aquaphor 2-3 times a day. Carry that with you and when itchy put vaseline or aquaphor on it to keep it moisturized - I will message about the GI referral  Take care and seek immediate care sooner if you develop any concerns.  Gerrit Heck, MD

## 2022-04-27 NOTE — Progress Notes (Cosign Needed Addendum)
    SUBJECTIVE:   CHIEF COMPLAINT / HPI: Continued leg pain  Bilateral leg swelling and pain Seen in ED after visit as well and given short course of lasix. Vas u/s negative on 8/19, bakers cyst still present on right. Has not been using stockings since her insurance does not cover the stockings. Aching in both legs. Voltaren gel helps some.   HTN Currently only taking carvedilol 3.125 mg twice daily.  Denies any symptoms currently, patient has adherent to medications.  Skin Rash  This whole year been going on that the under part of her arms on the right side and left side have lesions that are itchy.  She says sometimes the lesions break open and are bloody in color and do not contain any purulent drainage.  She has been trying Kenalog and it is not helping but unsure if she is taking it twice a day.  Zyrtec or hydroxyzine medication list however she is not taking it.  No fevers/systemic symptoms.  She says she would like to see dermatology for this.  GI Referral Would like to follow with Dr. Jola Baptist at [facility] instead of [facility].  PERTINENT  PMH / PSH: chronic leg swelling/edema  OBJECTIVE:   BP 138/74   Pulse 79   Ht 5\' 1"  (1.549 m)   Wt 181 lb 8 oz (82.3 kg)   SpO2 98%   BMI 34.29 kg/m   General: Well appearing, NAD, awake, alert, responsive to questions Head: Normocephalic atraumatic CV: Regular rate and rhythm no murmurs rubs or gallops Respiratory: Clear to ausculation bilaterally, no wheezes rales or crackles, chest rises symmetrically,  no increased work of breathing Abdomen: Soft, non-tender, non-distended, normoactive bowel sounds  Extremities: Moves upper and lower extremities freely, no edema in LE, mostly tender in right popliteal fossa where Baker's cyst is Skin: hyperpigmented macules on right arm     ASSESSMENT/PLAN:   Lower extremity edema We discussed that this is most likely related to venous insufficiency.  Patient would like to see  specialist about her legs as well.  I thought it was reasonable to refer her to sports medicine for evaluation as well. -Ambulatory referral to sports medicine -Continue Voltaren gel -DME order for compression stockings put in  HYPERTENSION, BENIGN On manual recheck was 138/74. -Continue carvedilol 3.125 twice daily  Skin lesions Does appear to be pruritic nodules likely from over excoriation of the area.  No active vesicles or papules seen. We discussed in the meantime before dermatologist appointment to apply triamcinolone twice a day every day.  We discussed the importance of barrier cream such as Vaseline and anytime that she feels itchy to use that.  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 -Triamcinolone twice daily -Vaseline often -Dermatology referral placed   GI Referral -Messaged referral coordinator  Gerrit Heck, MD [facility]

## 2022-04-30 ENCOUNTER — Telehealth: Payer: Self-pay | Admitting: Internal Medicine

## 2022-04-30 NOTE — Telephone Encounter (Signed)
Good afternoon Dr. Hilarie Fredrickson,   Patient called stating that she wanted to cancel her colonoscopy for 10/5 at 3:00 due to not wanting to have her procedure at this time.

## 2022-05-02 ENCOUNTER — Ambulatory Visit
Admission: RE | Admit: 2022-05-02 | Discharge: 2022-05-02 | Disposition: A | Discharge status: Home / Self Care | Payer: Medicare Other | Source: Ambulatory Visit | Attending: Family Medicine | Admitting: Family Medicine

## 2022-05-02 ENCOUNTER — Ambulatory Visit (INDEPENDENT_AMBULATORY_CARE_PROVIDER_SITE_OTHER): Payer: Medicare Other | Admitting: Sports Medicine

## 2022-05-02 VITALS — BP 177/71 | Ht 61.0 in | Wt 181.0 lb

## 2022-05-02 DIAGNOSIS — M25562 Pain in left knee: Secondary | ICD-10-CM | POA: Insufficient documentation

## 2022-05-02 DIAGNOSIS — M25561 Pain in right knee: Secondary | ICD-10-CM

## 2022-05-02 DIAGNOSIS — M545 Low back pain, unspecified: Secondary | ICD-10-CM | POA: Diagnosis not present

## 2022-05-02 NOTE — Progress Notes (Unsigned)
Established Patient Office Visit  Subjective   Patient ID: Megan Miller, female    DOB: 08-29-1955  Age: 66 y.o. MRN: [id number]  Bilateral leg pain.   Megan Miller presents today with chief complaint of bilateral leg pain that she noticed back in 2019.  She reports her pain has been persistent and also worsening over the past couple months.  Her pain is described as an achy sharp pain located all over both of her legs but mainly on the lateral and posterior aspect of her knees.  Her right leg seems to bother her more than her left.  She rates her pain a 9 out of 10 in severity.  She has tried Tylenol and aspirin with some relief of her pain however it returns shortly after.  Sometimes her pain is worse with walking but generally she has pain all the time.  Her pain does not wake her at night however she does have difficulty falling asleep because of her leg discomfort.  She has been seen at the urgent care as well as by her primary care provider for bilateral leg swelling.  It was recommended she try compression stockings however she is not been able to pick those up yet.  She denies any back pain or numbness and tingling down her legs.    ROS as listed above in HPI    Objective:     BP (!) 177/71   Ht 5\' 1"  (1.549 m)   Wt 181 lb (82.1 kg)   BMI 34.20 kg/m   Physical Exam Vitals reviewed.  Constitutional:      General: She is not in acute distress.    Appearance: Normal appearance. She is obese. She is not ill-appearing, toxic-appearing or diaphoretic.  Pulmonary:     Effort: Pulmonary effort is normal.  Neurological:     Mental Status: She is alert.   Patient appears comfortable seated on the exam table.  No obvious deformity or asymmetry in either of her legs.  No tenderness to palpation.  She has good strength at her knees, flexion extension 5/5.  She is able to walk up on her toes as well as her heels.  Negative seated straight leg raise bilaterally.  Normal gait     Assessment & Plan:   Problem List Items Addressed This Visit       Other   Arthralgia of both lower legs - Primary    Patient is suffering from bilateral leg pain with the past couple years.  Since her pain started she has had negative venous Dopplers for DVT.  I did replace an order for lumbar spine x-rays as this could be contributing to her pain.  I also ordered lab work, B12 level to evaluate for any abnormalities.  If these are both negative I would recommend returning to her PCP to follow-up on compression stockings for venous stasis and blood pressure control.  We can consider nerve blocking agent such as gabapentin if those interventions are not helpful.  She is on multiple medications so I would hesitate to jump to that option initially.  We will follow-up in a few weeks after imaging and blood work.      Relevant Orders   B12   DG Lumbar Spine 2-3 Views    Return in about 2 weeks (around 05/16/2022).    Elmore Guise, DO  Addendum:  Patient seen in the office by fellow.  Her history, exam, plan of care were precepted with me.  Norton Blizzard MD Marrianne Mood

## 2022-05-02 NOTE — Assessment & Plan Note (Signed)
Patient is suffering from bilateral leg pain with the past couple years.  Since her pain started she has had negative venous Dopplers for DVT.  I did replace an order for lumbar spine x-rays as this could be contributing to her pain.  I also ordered lab work, B12 level to evaluate for any abnormalities.  If these are both negative I would recommend returning to her PCP to follow-up on compression stockings for venous stasis and blood pressure control.  We can consider nerve blocking agent such as gabapentin if those interventions are not helpful.  She is on multiple medications so I would hesitate to jump to that option initially.  We will follow-up in a few weeks after imaging and blood work.

## 2022-05-02 NOTE — Patient Instructions (Signed)
We would like you to get x-rays of your back and some blood work done to check your vitamin levels.  I believe this is likely from your venous insufficiency like your primary is working you up for.  I would follow-up with them on the compression stockings and your blood pressure medications. After you get the x-rays and the blood work we will see you back to review them.

## 2022-05-03 ENCOUNTER — Telehealth: Payer: Self-pay

## 2022-05-03 ENCOUNTER — Encounter: Payer: Medicare Other | Admitting: Internal Medicine

## 2022-05-03 LAB — VITAMIN B12: Vitamin B-12: 416 pg/mL (ref 232–1245)

## 2022-05-03 NOTE — Telephone Encounter (Signed)
Patient calls nurse line requesting to speak with PCP in regards to blood pressure.   Patient reports she was seen at sports medicine yesterday. While there her BP was 177/71. On 9/29 at [facility] her BP was 138/74. Patient reports compliance to medications.   Patient denies at chest pains, SOB or vision changes.   Patient scheduled with PCP for 10/16.  ED precautions discussed.

## 2022-05-09 ENCOUNTER — Encounter: Payer: Self-pay | Admitting: Sports Medicine

## 2022-05-09 ENCOUNTER — Ambulatory Visit (INDEPENDENT_AMBULATORY_CARE_PROVIDER_SITE_OTHER): Payer: Medicare Other | Admitting: Sports Medicine

## 2022-05-09 VITALS — Ht 61.0 in | Wt 181.0 lb

## 2022-05-09 DIAGNOSIS — M25562 Pain in left knee: Secondary | ICD-10-CM | POA: Diagnosis not present

## 2022-05-09 DIAGNOSIS — M25561 Pain in right knee: Secondary | ICD-10-CM | POA: Diagnosis not present

## 2022-05-09 NOTE — Progress Notes (Signed)
   Established Patient Office Visit  Subjective   Patient ID: ADDI PAK, female    DOB: May 12, 1956  Age: 66 y.o. MRN: [id number]  Follow up xray results.   [name] is here today for follow-up on her x-ray and lab work.  X-rays reviewed with her she has some arthritic changes in her back however I do not believe these explain her bilateral circumferential leg pain.  B12 levels within normal limits.  Patient has follow-up appointment scheduled with her primary on Monday.  She reports she is still experiencing burning pain behind her right knee and bilateral leg pain.  Worse when she is walking.  She denies any back pain, buckling of her legs or numbness and tingling.   ROSas listed above in HPI    Objective:     Ht 5\' 1"  (1.549 m)   Wt 181 lb (82.1 kg)   BMI 34.20 kg/m   Physical Exam Vitals reviewed.  Constitutional:      General: She is not in acute distress.    Appearance: Normal appearance. She is obese. She is not ill-appearing, toxic-appearing or diaphoretic.  Pulmonary:     Effort: Pulmonary effort is normal.  Neurological:     Mental Status: She is alert.   Patient appears comfortable seated on the exam table.  No obvious deformity or asymmetry in either of her legs.  No tenderness to palpation.  She has good strength at her knees, flexion extension 5/5.  She is able to walk up on her toes as well as her heels.  Negative seated straight leg raise bilaterally.  Normal gait    Assessment & Plan:   Problem List Items Addressed This Visit       Other   Arthralgia of both lower legs - Primary    X-ray lumbar spine showed evidence of moderate facet hypertrophy.  I do not think that these findings explain her bilateral leg symptoms.  She reports her pain is persistent, no improvement with rest.  I do not believe that her pain is musculoskeletal in nature or secondary to claudication symptoms.  I recommend following up with her primary care provider so that she may  further pursue continuing diuretics and compression of stockings.  I believe her primary is on the right track and attributing her symptoms to venous stasis.  If she has no improvement with that could consider more of a nerve blocking medicine such as gabapentin or Neurontin, Cymbalta or evaluation by neurology.  She may follow-up with [place] as needed however I do not feel there is any other interventions for [place] to do at this time.       Return if symptoms worsen or fail to improve.    Megan Guise, DO  Addendum:  Patient seen in the office by fellow.  Her history, exam, plan of care were precepted with me.  Megan Lemon MD Kirt Boys

## 2022-05-09 NOTE — Assessment & Plan Note (Signed)
X-ray lumbar spine showed evidence of moderate facet hypertrophy.  I do not think that these findings explain her bilateral leg symptoms.  She reports her pain is persistent, no improvement with rest.  I do not believe that her pain is musculoskeletal in nature or secondary to claudication symptoms.  I recommend following up with her primary care provider so that she may further pursue continuing diuretics and compression of stockings.  I believe her primary is on the right track and attributing her symptoms to venous stasis.  If she has no improvement with that could consider more of a nerve blocking medicine such as gabapentin or Neurontin, Cymbalta or evaluation by neurology.  She may follow-up with [place] as needed however I do not feel there is any other interventions for [place] to do at this time.

## 2022-05-09 NOTE — Patient Instructions (Signed)
Your x-rays today showed some arthritic changes but nothing that I believe explains your bilateral leg pain.  Your B12 level was normal.  I recommend follow-up with your primary care provider you guys may discuss further medications or referrals at that time.  I believe she is on the right track with compression stockings and diuretics.  Follow-up with [place] as needed

## 2022-05-14 ENCOUNTER — Ambulatory Visit (INDEPENDENT_AMBULATORY_CARE_PROVIDER_SITE_OTHER): Payer: Medicare Other | Admitting: Student

## 2022-05-14 ENCOUNTER — Encounter: Payer: Self-pay | Admitting: Student

## 2022-05-14 VITALS — BP 144/77 | HR 76 | Ht 61.0 in | Wt 183.2 lb

## 2022-05-14 DIAGNOSIS — I10 Essential (primary) hypertension: Secondary | ICD-10-CM

## 2022-05-14 DIAGNOSIS — M79605 Pain in left leg: Secondary | ICD-10-CM

## 2022-05-14 DIAGNOSIS — R600 Localized edema: Secondary | ICD-10-CM | POA: Diagnosis not present

## 2022-05-14 DIAGNOSIS — M79604 Pain in right leg: Secondary | ICD-10-CM | POA: Diagnosis not present

## 2022-05-14 MED ORDER — ADULT BLOOD PRESSURE CUFF LG KIT: 1.0000 | PACK | Freq: Two times a day (BID) | 0 refills | Status: AC

## 2022-05-14 MED ORDER — GABAPENTIN 100 MG PO CAPS: 100.0000 mg | ORAL_CAPSULE | Freq: Every day | ORAL | 3 refills | Status: DC

## 2022-05-14 NOTE — Assessment & Plan Note (Signed)
Lower extremity edema still present with some pins-and-needles sensation in lower extremity.  Has not been using compression stockings suspect this is most likely secondary to venous insufficiency.  No chest pain or shortness of breath. -Start compression stockings -Trial low-dose gabapentin 100 mg nightly

## 2022-05-14 NOTE — Progress Notes (Signed)
    SUBJECTIVE:   CHIEF COMPLAINT / HPI:   Hypertension: Patient is a 66 y.o. female who present today for follow up of hypertension.   Patient endorses no problems however has been having higher blood pressures at the sports medicine clinic last which made her want to come back in to be assessed  Home medications include: carvedilol 3.125 mg BID however she is only taking this once in morning and not doing the night dose. Denies any acute headache, vision changes, shortness of breath, lower extremity swelling or chest pain   Most recent creatinine trend:  Lab Results  Component Value Date   CREATININE 0.75 03/17/2022   CREATININE 0.74 10/04/2021   CREATININE 0.85 07/20/2021   Patient does not check blood pressure at home.  Leg pain  Bilateral leg pain and swelling that has been going on for a while.  She is still not using compression stockings.  She feels like the pain sometimes feels like pins-and-needles and is mostly on the sides of her legs.  Worse at night.  Recently went to sports medicine who agreed not likely musculoskeletal in nature.  PERTINENT  PMH / PSH: HLD, CVA  OBJECTIVE:   BP (!) 144/77   Pulse 76   Ht 5\' 1"  (1.549 m)   Wt 183 lb 3.2 oz (83.1 kg)   SpO2 100%   BMI 34.62 kg/m   General: Well appearing, NAD, awake, alert, responsive to questions Head: Normocephalic atraumatic CV: Regular rate and rhythm no murmurs rubs or gallops Respiratory: Clear to ausculation bilaterally, no increased work of breathing  Extremities: 2+ pitting edema in LE bilaterally, non-tender to palpation on LE and no erythema present  ASSESSMENT/PLAN:   HYPERTENSION, BENIGN 167/71 and on recheck it was 144/77.  Patient is not currently taking her Coreg twice daily and is only taking it daily.  Seems like in the past she has been on HCTZ/lisinopril before.  She is not taking her blood pressure at home. -Start taking Coreg 3.125 twice daily -Blood pressure cuff sent -Take a.m.  and p.m. BP log and bring this to next visit for blood pressure recheck in 2 weeks  Lower extremity edema Lower extremity edema still present with some pins-and-needles sensation in lower extremity.  Has not been using compression stockings suspect this is most likely secondary to venous insufficiency.  No chest pain or shortness of breath. -Start compression stockings -Trial low-dose gabapentin 100 mg nightly   Gerrit Heck, MD [place]

## 2022-05-14 NOTE — Patient Instructions (Addendum)
It was great to see you! Thank you for allowing me to participate in your care!   Our plans for today:  - Please take your coreg TWICE a day - I have prescribed a low dose gabapentin to help with leg pain - Please start compression stockings -Please check your blood pressure in the morning and in the evening and write down what these readings were as well as bring these in for your next visit in 2 weeks  Take care and seek immediate care sooner if you develop any concerns.  Gerrit Heck, MD

## 2022-05-14 NOTE — Assessment & Plan Note (Signed)
167/71 and on recheck it was 144/77.  Patient is not currently taking her Coreg twice daily and is only taking it daily.  Seems like in the past she has been on HCTZ/lisinopril before.  She is not taking her blood pressure at home. -Start taking Coreg 3.125 twice daily -Blood pressure cuff sent -Take a.m. and p.m. BP log and bring this to next visit for blood pressure recheck in 2 weeks

## 2022-05-27 ENCOUNTER — Other Ambulatory Visit: Payer: Self-pay | Admitting: Family Medicine

## 2022-05-31 ENCOUNTER — Ambulatory Visit (INDEPENDENT_AMBULATORY_CARE_PROVIDER_SITE_OTHER): Payer: Medicare Other | Admitting: Student

## 2022-05-31 ENCOUNTER — Encounter: Payer: Self-pay | Admitting: Student

## 2022-05-31 VITALS — BP 136/80 | HR 82 | Wt 184.4 lb

## 2022-05-31 DIAGNOSIS — I10 Essential (primary) hypertension: Secondary | ICD-10-CM

## 2022-05-31 DIAGNOSIS — R252 Cramp and spasm: Secondary | ICD-10-CM

## 2022-05-31 NOTE — Progress Notes (Signed)
    SUBJECTIVE:   CHIEF COMPLAINT / HPI:   Hypertension: Patient is a 66 y.o. female who present today for follow up of hypertension.   Home medications include: Coreg 3.125 twice daily Patient endorses taking taking Coreg 3.125 only once daily as she gets dizzy at night if she takes the Coreg during that time.  Seems like in the past has been on HCTZ-lisinopril combination as well as spironolactone but had dehydration. Denies any headache, vision changes, shortness of breath, lower extremity swelling or chest pain   Most recent creatinine trend:  Lab Results  Component Value Date   CREATININE 0.75 03/17/2022   CREATININE 0.74 10/04/2021   CREATININE 0.85 07/20/2021   Patient does not check blood pressure at home. Was not able to get BP cuff  Patient has had a BMP in the past 1 year.  Chronic Leg Pain Mostly on the right side.  Says she does have some cramping when she walks long distances.  She has gotten ABIs before that were normal earlier this year.  DVT ultrasounds that were normal as well.  Been wearing compression stockings but not helping much.  She says the gabapentin has not helped much for her either.  PERTINENT  PMH / PSH: chronic leg pain, cva  OBJECTIVE:   BP 136/80   Pulse 82   Wt 184 lb 6.4 oz (83.6 kg)   SpO2 96%   BMI 34.84 kg/m   General: Well appearing, NAD, awake, alert, responsive to questions Head: Normocephalic atraumatic CV: Regular rate and rhythm no murmurs rubs or gallops Respiratory: Clear to ausculation bilaterally, no wheezes rales or crackles, no increased work of breathing Extremities: Moves upper and lower extremities freely, mildly tender to palpation in RL, no significant edema, wearing compression stockings, good pedal pulses  ASSESSMENT/PLAN:   HYPERTENSION, BENIGN Patient is controlled today at 136/80.  She is currently only taking Coreg once a day.  In the past it seems she has been on HCTZ-lisinopril combination as well as  spironolactone but possible she had dehydration with this.  Can consider switching to different agent such as an ARB at next visit. -Blood pressure log given -Reiterated picking up cuff at the pharmacy -Continue Coreg daily  Leg cramping Continues to have cramping/leg pain bilaterally but worse on the right side.  Has had overall benign work-up so far.  Gabapentin has not helped much.  Most likely venous stasis.  -Continue symptomatic measures such as compression stockings -Continue gabapentin 100 mg nightly, consider increasing if still not improving   Gerrit Heck, MD [place]

## 2022-05-31 NOTE — Assessment & Plan Note (Signed)
Continues to have cramping/leg pain bilaterally but worse on the right side.  Has had overall benign work-up so far.  Gabapentin has not helped much.  Most likely venous stasis.  -Continue symptomatic measures such as compression stockings -Continue gabapentin 100 mg nightly, consider increasing if still not improving

## 2022-05-31 NOTE — Patient Instructions (Addendum)
It was great to see you! Thank you for allowing me to participate in your care!   Our plans for today:  - Call [facility] and ask for a different provider within their system - Go to [facility] and get your BP cuff -Continue your coreg once a day since you ar having issues with twice a day -Follow up in 2-3 months  Blood Pressure Record Sheet To take your blood pressure, you will need a blood pressure machine. You can buy a blood pressure machine (blood pressure monitor) at your clinic, drug store, or online. When choosing one, consider: An automatic monitor that has an arm cuff. A cuff that wraps snugly around your upper arm. You should be able to fit only one finger between your arm and the cuff. A device that stores blood pressure reading results. Do not choose a monitor that measures your blood pressure from your wrist or finger. Follow your health care provider's instructions for how to take your blood pressure. To use this form: Take your blood pressure medications every day These measurements should be taken when you have been at rest for at least 10-15 min Take at least 2 readings with each blood pressure check. This makes sure the results are correct. Wait 1-2 minutes between measurements. Write down the results in the spaces on this form. Keep in mind it should always be recorded systolic over diastolic. Both numbers are important.  Repeat this every day for 2-3 weeks, or as told by your health care provider.  Make a follow-up appointment with your health care provider to discuss the results.  Blood Pressure Log Date Medications taken? (Y/N) Blood Pressure Time of Day                                                                                                          Take care and seek immediate care sooner if you develop any concerns.  Gerrit Heck, MD

## 2022-05-31 NOTE — Assessment & Plan Note (Addendum)
Patient is controlled today at 136/80.  She is currently only taking Coreg once a day.  In the past it seems she has been on HCTZ-lisinopril combination as well as spironolactone but possible she had dehydration with this.  Can consider switching to different agent such as an ARB at next visit. -Blood pressure log given -Reiterated picking up cuff at the pharmacy -Continue Coreg daily

## 2022-06-17 ENCOUNTER — Other Ambulatory Visit: Payer: Self-pay | Admitting: Student

## 2022-06-17 DIAGNOSIS — G8929 Other chronic pain: Secondary | ICD-10-CM

## 2022-07-06 ENCOUNTER — Ambulatory Visit (INDEPENDENT_AMBULATORY_CARE_PROVIDER_SITE_OTHER): Payer: Medicare Other | Admitting: Family Medicine

## 2022-07-06 VITALS — BP 154/78 | HR 100 | Ht 61.0 in | Wt 182.0 lb

## 2022-07-06 DIAGNOSIS — I10 Essential (primary) hypertension: Secondary | ICD-10-CM | POA: Diagnosis not present

## 2022-07-06 DIAGNOSIS — R051 Acute cough: Secondary | ICD-10-CM

## 2022-07-06 NOTE — Assessment & Plan Note (Signed)
-  BP 154/78, likely slightly elevated in the setting of viral illness and recent med noncompliance -stressed importance of remaining compliant on her medications and instructed to take this daily -follow up with PCP for BP recheck and adjusting meds as appropriate

## 2022-07-06 NOTE — Progress Notes (Signed)
    SUBJECTIVE:   CHIEF COMPLAINT / HPI:   Patient presents with cough with clearish white phlegm and headache that started last week with a sore throat. Denies fever, chills, nausea, vomiting and myalgias. Sick contacts include her friend who had similar symptoms and feeling better now. Compared to the other days, she admits to feeling better since her symptoms first started.   History of hypertension, she has not taken her antihypertensive medications in 3 days. Denies chest pain, dyspnea, leg swelling or other symptoms.   OBJECTIVE:   BP (!) 154/78 Comment: no meds taken in 3 days  Pulse 100   Ht 5\' 1"  (1.549 m)   Wt 182 lb (82.6 kg)   SpO2 100%   BMI 34.39 kg/m   General: Patient well-appearing, in no acute distress. HEENT: PERRLA, normal buccal mucosa, no tonsillar exudate or erythema, presence of anterior cervical LAD  CV: RRR, no murmurs or gallops auscultated  Resp: CTAB, no wheezing, rales or rhonchi noted Abdomen: soft, nontender, nondistended, presence of bowel sounds Ext: no LE edema noted bilaterally  ASSESSMENT/PLAN:   Cough -likely secondary to viral etiology, reassurance provided -conservative measures discussed including honey -follow up as appropriate   HYPERTENSION, BENIGN -BP 154/78, likely slightly elevated in the setting of viral illness and recent med noncompliance -stressed importance of remaining compliant on her medications and instructed to take this daily -follow up with PCP for BP recheck and adjusting meds as appropriate      , DO [facility]

## 2022-07-06 NOTE — Patient Instructions (Signed)
It was great seeing you today!  Today we discussed your symptoms, it seems that you have a viral infection. I am glad that this is improving. The cough can linger, honey and warm tea can help. Using a humidifier can help with any linger congestion as well.   Please follow up at your next scheduled appointment, if anything arises between now and then, please don't hesitate to contact our office.   Thank you for allowing [place] to be a part of your medical care!  Thank you, Dr. Robyne Peers  Also a reminder of our clinic's no-show policy. Please make sure to arrive at least 15 minutes prior to your scheduled appointment time. Please try to cancel before 24 hours if you are not able to make it. If you no-show for 2 appointments then you will be receiving a warning letter. If you no-show after 3 visits, then you may be at risk of being dismissed from our clinic. This is to ensure that everyone is able to be seen in a timely manner. Thank you, we appreciate your assistance with this!

## 2022-07-06 NOTE — Assessment & Plan Note (Signed)
-  likely secondary to viral etiology, reassurance provided -conservative measures discussed including honey -follow up as appropriate

## 2022-07-18 ENCOUNTER — Telehealth: Payer: Self-pay | Admitting: Family Medicine

## 2022-07-18 ENCOUNTER — Ambulatory Visit (INDEPENDENT_AMBULATORY_CARE_PROVIDER_SITE_OTHER): Payer: Medicare Other | Admitting: Student

## 2022-07-18 ENCOUNTER — Encounter: Payer: Self-pay | Admitting: Student

## 2022-07-18 VITALS — BP 143/68 | HR 77 | Ht 61.0 in | Wt 184.6 lb

## 2022-07-18 DIAGNOSIS — R252 Cramp and spasm: Secondary | ICD-10-CM

## 2022-07-18 DIAGNOSIS — Z23 Encounter for immunization: Secondary | ICD-10-CM

## 2022-07-18 DIAGNOSIS — I10 Essential (primary) hypertension: Secondary | ICD-10-CM

## 2022-07-18 DIAGNOSIS — R058 Other specified cough: Secondary | ICD-10-CM | POA: Diagnosis not present

## 2022-07-18 MED ORDER — BENZONATATE 100 MG PO CAPS: 100.0000 mg | ORAL_CAPSULE | Freq: Three times a day (TID) | ORAL | 0 refills | Status: DC | PRN

## 2022-07-18 NOTE — Patient Instructions (Addendum)
It was great to see you! Thank you for allowing me to participate in your care!   Our plans for today:  - I will reach out to [facility] about the GI referral, although I advise you to see the GI office you are with currently - wear long compression hose and use  skin tac to keep it up - I'm increasing your gabapentin to 200 mg nightly - If able, cut carvedilol in half and use this twice a day  Take care and seek immediate care sooner if you develop any concerns.  Levin Erp, MD

## 2022-07-18 NOTE — Telephone Encounter (Signed)
[facility] After Hours Line Phone Call Received after hours phone from patient. Called patient X2. Left generic voicemail to call back as needed.  Terisa Starr, MD  Family Medicine Teaching Service

## 2022-07-18 NOTE — Progress Notes (Signed)
    SUBJECTIVE:   CHIEF COMPLAINT / HPI:   Chronic leg pain Burnign feeling all the time. Mostly when walking. When sitting down calms down. Pins and needle feeling in her legs. Has gotten recent ABI this year that were normal. Has had DVT u/s that were negative and no unilateral swelling. Referred to sports medicine and they also believe likely due to venous stasis. She feels the gabapentin not helping much. Wearing short compression stockings.  Cough Light dry cough for the past week. Kandy Garrison had viral infection. Does have some mucus production (white colored). No fevers. No vomiting or diarrhea. No blood in spit. Never smoker.   Hypertension: Patient is a 66 y.o. female who present today for follow up of hypertension.   Patient endorses no problems  Home medications include: coreg 3.125 (only doing once a day as she feels that it is "too strong" when she takes it twice a day and she feels unwell)  Most recent creatinine trend:  Lab Results  Component Value Date   CREATININE 0.75 03/17/2022   CREATININE 0.74 10/04/2021   CREATININE 0.85 07/20/2021   Patient does check blood pressure at home.   PERTINENT  PMH / PSH: hx cva  OBJECTIVE:   BP (!) 143/68   Pulse 77   Ht 5\' 1"  (1.549 m)   Wt 184 lb 9.6 oz (83.7 kg)   SpO2 100%   BMI 34.88 kg/m   General: Well appearing, NAD, awake, alert, responsive to questions Head: Normocephalic atraumatic CV: Regular rate and rhythm no murmurs rubs or gallops Respiratory: Clear to ausculation bilaterally, no wheezes rales or crackles, chest rises symmetrically,  no increased work of breathing Abdomen: Soft, non-tender, non-distended, normoactive bowel sounds  Extremities: Moves upper and lower extremities freely, trace edema bilaterally, mildly tender to palpation bilateral calves, no erythema/overlying skin changes Neuro: No focal deficits Skin: No rashes or lesions visualized   ASSESSMENT/PLAN:   HYPERTENSION, BENIGN 143/68 in  office today. At home readings mostly in 120s-130s. She feels like when she takes BID coreg she feels like it is "too strong" -discussed halving coreg and taking BID if possible -consider addition of arb if patient continues to have elevated readings  Cough Likely secondary from recent viral exposure. -tessalon perles sent to pharmacy  Leg cramping Stable. No red flags for DVT, PAD (recent ABI normal). -compression stockings-discussed getting long ones as she was wearing short ones in clinic, skin tac discussed as well -increase gabapentin to 200 mg qhs for pins and needle sensation   Flushot given today  , MD [facility]

## 2022-07-19 NOTE — Assessment & Plan Note (Signed)
Stable. No red flags for DVT, PAD (recent ABI normal). -compression stockings-discussed getting long ones as she was wearing short ones in clinic, skin tac discussed as well -increase gabapentin to 200 mg qhs for pins and needle sensation

## 2022-07-19 NOTE — Assessment & Plan Note (Signed)
143/68 in office today. At home readings mostly in 120s-130s. She feels like when she takes BID coreg she feels like it is "too strong" -discussed halving coreg and taking BID if possible -consider addition of arb if patient continues to have elevated readings

## 2022-07-19 NOTE — Telephone Encounter (Signed)
Patient calls nurse line in regards to [facility].  She reports the copay is 11 dollars. She reports she does not have 11 dollars to cover this.   I looked up prescription on [facility], however her insurance coverage copay is the best price.   Advised patient to call the pharmacy and see how much half of the prescription would be. Hopefully more affordable.   Patient agreed with plan.

## 2022-07-19 NOTE — Assessment & Plan Note (Signed)
Likely secondary from recent viral exposure. -tessalon perles sent to pharmacy

## 2022-07-20 ENCOUNTER — Other Ambulatory Visit: Payer: Self-pay | Admitting: Student

## 2022-07-29 ENCOUNTER — Other Ambulatory Visit: Payer: Self-pay | Admitting: Family Medicine

## 2022-08-13 ENCOUNTER — Other Ambulatory Visit: Payer: Self-pay | Admitting: Student

## 2022-08-13 DIAGNOSIS — G8929 Other chronic pain: Secondary | ICD-10-CM

## 2022-08-14 ENCOUNTER — Other Ambulatory Visit: Payer: Self-pay | Admitting: Student

## 2022-08-14 DIAGNOSIS — R21 Rash and other nonspecific skin eruption: Secondary | ICD-10-CM

## 2022-08-14 DIAGNOSIS — I10 Essential (primary) hypertension: Secondary | ICD-10-CM

## 2022-08-15 ENCOUNTER — Telehealth: Payer: Self-pay | Admitting: *Deleted

## 2022-08-15 NOTE — Telephone Encounter (Signed)
Patient left message on referral line requesting a social Development worker, community.  Will forward to Md to place referral.  She is a [facility] patient.  Jamaiyah Pyle,CMA

## 2022-08-15 NOTE — Progress Notes (Signed)
  Care Coordination  Outreach Note  08/15/2022 Name: Megan Miller MRN: [id number] DOB: 1956/05/17   Care Coordination Outreach Attempts: An unsuccessful telephone outreach was attempted today to offer the patient information about available care coordination services as a benefit of their health plan.   Follow Up Plan:  Additional outreach attempts will be made to offer the patient care coordination information and services.   Encounter Outcome:  No Answer  [place]  Direct Dial: [phone]

## 2022-08-17 NOTE — Progress Notes (Signed)
  Care Coordination  Outreach Note  08/17/2022 Name: SHIZA THELEN MRN: [id number] DOB: 1955-09-09   Care Coordination Outreach Attempts: A second unsuccessful outreach was attempted today to offer the patient with information about available care coordination services as a benefit of their health plan.     Follow Up Plan:  Additional outreach attempts will be made to offer the patient care coordination information and services.   Encounter Outcome:  No Answer  [place]  Direct Dial: [phone]

## 2022-08-21 NOTE — Progress Notes (Signed)
  Care Coordination  Outreach Note  08/21/2022 Name: Megan Miller MRN: [id number] DOB: Sep 27, 1955   Care Coordination Outreach Attempts: A third unsuccessful outreach was attempted today to offer the patient with information about available care coordination services as a benefit of their health plan.   Follow Up Plan:  No further outreach attempts will be made at this time. We have been unable to contact the patient to offer or enroll patient in care coordination services  Encounter Outcome:  No Answer  [place]: [phone]

## 2022-08-23 ENCOUNTER — Telehealth: Payer: Self-pay | Admitting: *Deleted

## 2022-08-23 NOTE — Progress Notes (Signed)
  Care Coordination  Outreach Note  08/23/2022 Name: Megan Miller MRN: [id number] DOB: 05-21-1956   Care Coordination Outreach Attempts: An unsuccessful telephone outreach was attempted today to offer the patient information about available care coordination services as a benefit of their health plan.   Follow Up Plan:  Additional outreach attempts will be made to offer the patient care coordination information and services.   Encounter Outcome:  No Answer  [place]  Direct Dial: [phone]

## 2022-08-27 NOTE — Progress Notes (Signed)
  Care Coordination   Note   08/27/2022 Name: DEVENEY BAYON MRN: [id number] DOB: 1956/03/23  Laurian Brim Gallina is a 67 y.o. year old female who sees Gerrit Heck, MD for primary care. I reached out to [facility] by phone today to offer care coordination services.  Ms. Holwerda was given information about Care Coordination services today including:   The Care Coordination services include support from the care team which includes your Nurse Coordinator, Clinical Social Worker, or Pharmacist.  The Care Coordination team is here to help remove barriers to the health concerns and goals most important to you. Care Coordination services are voluntary, and the patient may decline or stop services at any time by request to their care team member.   Care Coordination Consent Status: Patient agreed to services and verbal consent obtained.   Follow up plan:  Telephone appointment with care coordination team member scheduled for:  [facility] 08/31/22 and SW 09/04/22  Encounter Outcome:  Pt. Scheduled  [place]  Direct Dial: [phone]

## 2022-08-31 ENCOUNTER — Telehealth: Payer: Self-pay

## 2022-08-31 NOTE — Patient Outreach (Signed)
  Care Coordination   08/31/2022 Name: Megan Miller MRN: [id number] DOB: 06-May-1956   Care Coordination Outreach Attempts:  An unsuccessful telephone outreach was attempted today to offer the patient information about available care coordination services as a benefit of their health plan.   Follow Up Plan:  Additional outreach attempts will be made to offer the patient care coordination information and services.   Encounter Outcome:  No Answer   Care Coordination Interventions:  No, not indicated    Lazaro Arms RN, BSN, [place]   Phone: [phone]

## 2022-09-03 ENCOUNTER — Telehealth: Payer: Self-pay | Admitting: *Deleted

## 2022-09-03 DIAGNOSIS — M79604 Pain in right leg: Secondary | ICD-10-CM

## 2022-09-03 MED ORDER — GABAPENTIN 100 MG PO CAPS: 200.0000 mg | ORAL_CAPSULE | Freq: Every day | ORAL | 1 refills | Status: DC

## 2022-09-03 NOTE — Telephone Encounter (Signed)
Patient is asking for something else or increased dose of gabapentin for the leg pain she experiences for her "veins".  She states that she is taking the gabapentin at night.  Will forward to MD.  Johnney Ou

## 2022-09-03 NOTE — Telephone Encounter (Signed)
Patient informed and voiced understanding.  Liam Bossman,CMA  

## 2022-09-04 ENCOUNTER — Ambulatory Visit: Payer: Self-pay | Admitting: Licensed Clinical Social Worker

## 2022-09-04 NOTE — Patient Outreach (Signed)
  Care Coordination   09/04/2022 Name: Megan Miller MRN: [id number] DOB: [birth year]   Care Coordination Outreach Attempts:  An unsuccessful telephone outreach was attempted today to offer the patient information about available care coordination services as a benefit of their health plan.   Follow Up Plan:  Additional outreach attempts will be made to offer the patient care coordination information and services.   Encounter Outcome:  No Answer   Care Coordination Interventions:  No, not indicated    Norva Riffle.Graison Leinberger MSW, [place] [facility] representative [facility] Care Management [phone]

## 2022-09-05 ENCOUNTER — Telehealth: Payer: Self-pay

## 2022-09-05 NOTE — Patient Outreach (Signed)
  Care Coordination   09/05/2022 Name: Megan Miller MRN: [id number] DOB: [birth year]   Care Coordination Outreach Attempts:  A second unsuccessful outreach was attempted today to offer the patient with information about available care coordination services as a benefit of their health plan.     Follow Up Plan:  Additional outreach attempts will be made to offer the patient care coordination information and services.   Encounter Outcome:  No Answer   Care Coordination Interventions:  No, not indicated    Lazaro Arms RN, BSN, [place] [facility]   Phone: [phone]

## 2022-09-08 ENCOUNTER — Other Ambulatory Visit: Payer: Self-pay | Admitting: Student

## 2022-09-10 ENCOUNTER — Telehealth: Payer: Self-pay | Admitting: *Deleted

## 2022-09-10 NOTE — Progress Notes (Signed)
  Care Coordination Note  09/10/2022 Name: Megan Miller MRN: [id number] DOB: [birth year]  Megan Miller is a 67 y.o. year old female who is a primary care patient of Gerrit Heck, MD and is actively engaged with the care management team. I reached out to [facility] by phone today to assist with re-scheduling an initial visit with the RN Case Manager and Licensed Clinical Social Worker  Follow up plan: Unsuccessful telephone outreach attempt made. A HIPAA compliant phone message was left for the patient providing contact information and requesting a return call.   [place]  Direct Dial: [phone]

## 2022-09-11 NOTE — Progress Notes (Signed)
  Care Coordination Note  09/11/2022 Name: Megan Miller MRN: [id number] DOB: Jan 07, 1956  Megan Miller is a 67 y.o. year old female who is a primary care patient of Gerrit Heck, MD and is actively engaged with the care management team. I reached out to [facility] by phone today to assist with re-scheduling an initial visit with the RN Case Manager and Licensed Clinical Social Worker  Follow up plan: Unsuccessful telephone outreach attempt made. A HIPAA compliant phone message was left for the patient providing contact information and requesting a return call.   [place]  Direct Dial: [phone]

## 2022-09-11 NOTE — Telephone Encounter (Signed)
Pt returns call to [facility] ( left message) stating that she saw missed call from [place].  She ask for a call back @ [phone]

## 2022-09-14 NOTE — Progress Notes (Unsigned)
  Care Coordination Note  09/14/2022 Name: Megan Miller MRN: [id number] DOB: 1956/04/04  Megan Miller is a 67 y.o. year old female who is a primary care patient of Gerrit Heck, MD and is actively engaged with the care management team. I reached out to [facility] by phone today to assist with re-scheduling {CCMINITIALFOLLOWUP:23588} with the {CCM DISCIPLINE CHOICES:22236}  Follow up plan: {CCM CG FOLLOW UP [id number]  SIGNATURE

## 2022-09-17 NOTE — Progress Notes (Addendum)
  Care Coordination Note  09/17/2022 Name: ALSACE QUIRINDONGO MRN: [id number] DOB: [birth year]  Laurian Brim Sehnert is a 67 y.o. year old female who is a primary care patient of Gerrit Heck, MD and is actively engaged with the care management team. I reached out to [facility] by phone today to assist with re-scheduling an initial visit with the RN Case Manager and Licensed Clinical Social Worker  Follow up plan: Unsuccessful telephone outreach attempt made. A HIPAA compliant phone message was left for the patient providing contact information and requesting a return call.  We have been unable to make contact with the patient for follow up. The care coordination team is available to follow up with the patient after provider conversation with the patient regarding recommendation for care management engagement and subsequent re-referral to the care coordination team.   [place]  Direct Dial: [phone]

## 2022-09-29 ENCOUNTER — Other Ambulatory Visit: Payer: Self-pay | Admitting: Student

## 2022-09-29 ENCOUNTER — Other Ambulatory Visit: Payer: Self-pay | Admitting: Family Medicine

## 2022-09-29 DIAGNOSIS — L299 Pruritus, unspecified: Secondary | ICD-10-CM

## 2022-09-29 DIAGNOSIS — R21 Rash and other nonspecific skin eruption: Secondary | ICD-10-CM

## 2022-09-29 DIAGNOSIS — M79604 Pain in right leg: Secondary | ICD-10-CM

## 2022-10-09 ENCOUNTER — Other Ambulatory Visit: Payer: Self-pay | Admitting: Student

## 2022-10-09 DIAGNOSIS — R21 Rash and other nonspecific skin eruption: Secondary | ICD-10-CM

## 2022-10-15 ENCOUNTER — Other Ambulatory Visit: Payer: Self-pay | Admitting: Student

## 2022-10-15 DIAGNOSIS — R058 Other specified cough: Secondary | ICD-10-CM

## 2022-10-30 ENCOUNTER — Ambulatory Visit: Payer: Self-pay | Admitting: Licensed Clinical Social Worker

## 2022-10-30 NOTE — Patient Outreach (Signed)
  Care Coordination   10/30/2022 Name: Megan Miller MRN: [id number] DOB: 19-Mar-1956   Care Coordination Outreach Attempts:  An unsuccessful telephone outreach was attempted today to offer the patient information about available care coordination services as a benefit of their health plan.   Follow Up Plan:  Additional outreach attempts will be made to offer the patient care coordination information and services.   Encounter Outcome:  No Answer   Care Coordination Interventions:  No, not indicated    Norva Riffle.Ona Roehrs MSW, [place] [facility] representative [facility] Care Management [phone]

## 2022-11-02 ENCOUNTER — Telehealth: Payer: Self-pay | Admitting: Student

## 2022-11-02 NOTE — Telephone Encounter (Signed)
Called patient to schedule Medicare Annual Wellness Visit (AWV). Left message for patient to call back and schedule Medicare Annual Wellness Visit (AWV).  Last date of AWV: 03/17/2021   Please schedule an AWVS appointment at any time with [facility] VISIT.  If any questions, please contact me at [phone].    Thank you,  [facility] Support [facility] Direct dial  [phone]

## 2022-11-05 ENCOUNTER — Other Ambulatory Visit: Payer: Self-pay | Admitting: Family Medicine

## 2022-11-05 DIAGNOSIS — Z1231 Encounter for screening mammogram for malignant neoplasm of breast: Secondary | ICD-10-CM

## 2022-11-06 ENCOUNTER — Ambulatory Visit
Admission: RE | Admit: 2022-11-06 | Discharge: 2022-11-06 | Disposition: A | Discharge status: Home / Self Care | Payer: Medicare HMO | Source: Ambulatory Visit | Attending: Family Medicine | Admitting: Family Medicine

## 2022-11-06 DIAGNOSIS — Z1231 Encounter for screening mammogram for malignant neoplasm of breast: Secondary | ICD-10-CM

## 2022-11-27 ENCOUNTER — Other Ambulatory Visit: Payer: Self-pay | Admitting: Student

## 2022-11-27 DIAGNOSIS — R058 Other specified cough: Secondary | ICD-10-CM

## 2022-12-09 ENCOUNTER — Encounter (HOSPITAL_COMMUNITY): Payer: Self-pay | Admitting: Pharmacy Technician

## 2022-12-09 ENCOUNTER — Emergency Department (HOSPITAL_COMMUNITY)
Admission: EM | Admit: 2022-12-09 | Discharge: 2022-12-09 | Disposition: A | Discharge status: Home / Self Care | Payer: Medicare HMO | Attending: Emergency Medicine | Admitting: Emergency Medicine

## 2022-12-09 ENCOUNTER — Other Ambulatory Visit: Payer: Self-pay

## 2022-12-09 DIAGNOSIS — G8929 Other chronic pain: Secondary | ICD-10-CM

## 2022-12-09 DIAGNOSIS — Z7982 Long term (current) use of aspirin: Secondary | ICD-10-CM | POA: Diagnosis not present

## 2022-12-09 DIAGNOSIS — I10 Essential (primary) hypertension: Secondary | ICD-10-CM | POA: Insufficient documentation

## 2022-12-09 DIAGNOSIS — Z79899 Other long term (current) drug therapy: Secondary | ICD-10-CM | POA: Diagnosis not present

## 2022-12-09 DIAGNOSIS — M5442 Lumbago with sciatica, left side: Secondary | ICD-10-CM | POA: Diagnosis not present

## 2022-12-09 DIAGNOSIS — M545 Low back pain, unspecified: Secondary | ICD-10-CM | POA: Diagnosis present

## 2022-12-09 MED ORDER — DEXAMETHASONE SODIUM PHOSPHATE 10 MG/ML IJ SOLN
10.0000 mg | Freq: Once | INTRAMUSCULAR | Status: AC
  Administered 2022-12-09: 10 mg via INTRAMUSCULAR
  Filled 2022-12-09: qty 1

## 2022-12-09 MED ORDER — ACETAMINOPHEN 500 MG PO TABS
1000.0000 mg | ORAL_TABLET | Freq: Once | ORAL | Status: AC
  Administered 2022-12-09: 1000 mg via ORAL
  Filled 2022-12-09: qty 2

## 2022-12-09 MED ORDER — METHYLPREDNISOLONE 4 MG PO TBPK: ORAL_TABLET | ORAL | 0 refills | Status: DC

## 2022-12-09 NOTE — ED Provider Notes (Signed)
[place] EMERGENCY DEPARTMENT AT [facility] Provider Note   CSN: [id number] Arrival date & time: 12/09/22  1222     History  Chief Complaint  Patient presents with   Back Pain    Megan Miller is a 67 y.o. female.  Patient complains of left-sided low back pain with radiation down the left leg to the level of the foot.  She states this began on Friday.  She has taken Tylenol at home without any relief of pain.  She denies any trauma to the area.  She denies any falls.  Patient also denies saddle anesthesia, urinary incontinence, urinary retention, fecal incontinence.  Past medical history significant for stroke, GERD, hypertension, chronic left-sided low back pain without sciatica  HPI     Home Medications Prior to Admission medications   Medication Sig Start Date End Date Taking? Authorizing Provider  methylPREDNISolone (MEDROL DOSEPAK) 4 MG TBPK tablet Take as directed per package instructions 12/09/22  Yes Darrick Grinder, PA-C  acetaminophen (TYLENOL 8 HOUR) 650 MG CR tablet Take 1 tablet (650 mg total) by mouth every 6 (six) hours as needed for pain. 02/24/21   [name], DO  aspirin 81 MG tablet Take 1 tablet (81 mg total) by mouth daily. 09/23/14   Glori Luis, MD  atorvastatin (LIPITOR) 40 MG tablet TAKE 1 TABLET BY MOUTH EVERY DAY 08/01/22   Dameron, Nolberto Hanlon, DO  benzonatate (TESSALON PERLES) 100 MG capsule Take 1 capsule (100 mg total) by mouth 3 (three) times daily as needed for cough. 07/18/22   Levin Erp, MD  Blood Pressure Monitoring (ADULT BLOOD PRESSURE CUFF LG) KIT 1 kit by Does not apply route 2 (two) times daily. 05/14/22   Levin Erp, MD  carvedilol (COREG) 3.125 MG tablet TAKE 1 TABLET BY MOUTH TWICE A DAY WITH A MEAL 03/22/22   Levin Erp, MD  cetirizine (ZYRTEC) 10 MG tablet Take 1 tablet (10 mg total) by mouth daily. 11/20/21   Brimage, Seward Meth, DO  diclofenac Sodium (VOLTAREN) 1 % GEL APPLY 2 GRAMS TO AFFECTED AREA 4  TIMES A DAY 08/13/22   Levin Erp, MD  Emollient (EUCERIN) lotion Apply topically as needed for dry skin.    [provider]  famotidine (PEPCID) 40 MG tablet TAKE 1 TABLET BY MOUTH EVERYDAY AT BEDTIME 04/06/22   [place], Amy S, PA-C  gabapentin (NEURONTIN) 100 MG capsule Take 2 capsules (200 mg total) by mouth at bedtime. 09/03/22   Levin Erp, MD  hydrOXYzine (ATARAX) 10 MG tablet Take 1 tablet (10 mg total) by mouth at bedtime as needed for itching. 11/20/21   Brimage, Seward Meth, DO  ipratropium (ATROVENT) 0.06 % nasal spray Place 2 sprays into both nostrils 4 (four) times daily. Patient taking differently: Place 2 sprays into both nostrils as needed. 09/26/16 02/27/25  Bonney Aid, MD  olopatadine (PATANOL) 0.1 % ophthalmic solution Place 1 drop into both eyes 2 (two) times daily as needed (seasonal allergies/itching).     [provider]  polyethylene glycol powder (GLYCOLAX/MIRALAX) 17 GM/SCOOP powder Dissolve 17grams in at at least 32 ounces of water or juice daily as needed. 09/21/21   Brimage, Seward Meth, DO  RABEprazole (ACIPHEX) 20 MG tablet TAKE 1 TABLET (20 MG TOTAL) BY MOUTH IN THE MORNING AND AT BEDTIME 09/11/22   Levin Erp, MD  senna (SENOKOT) 8.6 MG TABS tablet Take 2 tablets (17.2 mg total) by mouth daily as needed for mild constipation. 09/21/21   [name], DO  Skin  Protectants, Misc. (EUCERIN) cream Apply topically as needed for dry skin.    [provider]  Soap & Cleansers (NEUTROGENA FACIAL SOAP) BAR Apply 1 each topically in the morning and at bedtime. 11/20/21   Brimage, Seward Meth, DO  triamcinolone ointment (KENALOG) 0.1 % APPLY TO AFFECTED AREA TWICE A DAY 10/10/22   Levin Erp, MD  Benay Spice 5 % SOLN Apply 1 drop to eye 2 (two) times daily. 01/13/22   [provider]      Allergies    Percocet [oxycodone-acetaminophen] and Codeine phosphate    Review of Systems   Review of Systems  Physical Exam Updated Vital Signs BP  (!) 193/96   Pulse 83   Temp 98.9 F (37.2 C)   Resp 16   SpO2 100%  Physical Exam Vitals and nursing note reviewed.  Constitutional:      General: She is not in acute distress.    Appearance: She is well-developed.  HENT:     Head: Normocephalic and atraumatic.  Eyes:     Conjunctiva/sclera: Conjunctivae normal.  Cardiovascular:     Rate and Rhythm: Normal rate and regular rhythm.     Heart sounds: No murmur heard. Pulmonary:     Effort: Pulmonary effort is normal. No respiratory distress.     Breath sounds: Normal breath sounds.  Abdominal:     Palpations: Abdomen is soft.     Tenderness: There is no abdominal tenderness.  Musculoskeletal:        General: Tenderness (Left lower lumbar region.  No midline spinal tenderness.) present. No swelling.     Cervical back: Neck supple.  Skin:    General: Skin is warm and dry.     Capillary Refill: Capillary refill takes less than 2 seconds.  Neurological:     Mental Status: She is alert.  Psychiatric:        Mood and Affect: Mood normal.     ED Results / Procedures / Treatments   Labs (all labs ordered are listed, but only abnormal results are displayed) Labs Reviewed - No data to display  EKG None  Radiology No results found.  Procedures Procedures    Medications Ordered in ED Medications  dexamethasone (DECADRON) injection 10 mg (10 mg Intramuscular Given 12/09/22 1344)  acetaminophen (TYLENOL) tablet 1,000 mg (1,000 mg Oral Given 12/09/22 1344)    ED Course/ Medical Decision Making/ A&P                             Medical Decision Making Risk OTC drugs. Prescription drug management.   Patient presents with chief complaint of back pain.  This been ongoing for 3 days.  Differential diagnosis includes but not limited to fracture, dislocation, soft tissue injury, radiculopathy, others  I reviewed outpatient medical records including notes from October 16 of last year when patient was evaluated for lower  extremity pain.  I also found notes for patient has been treated in the past for chronic low left-sided back pain without sciatica  There is no indication at this time for spinal imaging.  The patient had no trauma.  No red flag symptoms.  I ordered the patient Decadron for inflammation along with Tylenol for pain.  Upon reassessment she was feeling somewhat better  Plan to discharge patient at this time with a diagnosis of low back pain with left-sided sciatica.  Patient will follow-up with her primary care provider for further evaluation and management.  Considered  NSAID prescription but patient states that she has been told not to take these due to underlying heart issues.  Patient will continue on extra strength Tylenol and I will prescribe a steroid Dosepak.  Return precautions provided.  Discharged home        Final Clinical Impression(s) / ED Diagnoses Final diagnoses:  Chronic left-sided low back pain with left-sided sciatica    Rx / DC Orders ED Discharge Orders          Ordered    methylPREDNISolone (MEDROL DOSEPAK) 4 MG TBPK tablet        12/09/22 1430              Darrick Grinder, PA-C 12/09/22 1431    Elayne Snare K, DO 12/09/22 1532

## 2022-12-09 NOTE — Discharge Instructions (Addendum)
You were evaluated today for low back pain with symptoms radiating down the left leg.  The symptoms are consistent with sciatica.  Please take the prescribed steroid Dosepak.  Please continue to take Tylenol at home.  Do not exceed 1000 mg of Tylenol every 8 hours.  Please follow-up with your primary care provider for further evaluation and management.

## 2022-12-09 NOTE — ED Triage Notes (Signed)
Pt with L sided back pain radiating down her leg onset Friday. Taken otc medications without relief.

## 2023-04-15 ENCOUNTER — Telehealth: Payer: Self-pay | Admitting: Pharmacist

## 2023-04-15 NOTE — Telephone Encounter (Signed)
Attempted to contact patient for follow-up of blood pressure/hypertension control.   Left HIPAA compliant voice mail requesting call back to direct phone: [phone] for scheduling blood pressure visit with clinic pharmacist.   Total time with patient call and documentation of interaction: 2 minutes.

## 2023-04-18 ENCOUNTER — Other Ambulatory Visit: Payer: Self-pay | Admitting: Student

## 2023-04-23 NOTE — Progress Notes (Unsigned)
Cardiology Clinic Note   Patient Name: Megan Miller Date of Encounter: 04/25/2023  Primary Care Provider:  Levin Erp, MD Primary Cardiologist:  Donato Schultz, MD  Patient Profile    67  year old female with chronic chest pain. Chronic LEE, HL, hypertension, normal coronary CTA on 11/01/2021 with coronary calcium score of 0, and chronic varicose veins..    Past Medical History    Past Medical History:  Diagnosis Date   Allergic conjunctivitis    Arthritis    Chest pain    Chronic back pain    Constipation, chronic    Dyspnea on exertion    Dysthymic    GERD (gastroesophageal reflux disease)    Hyperlipidemia    Hypertension    Leg edema    Asymetric Right leg (dopplers negative)   Overweight (BMI 25.0-29.9)    Stroke (HCC) 2019   Urination frequency    Past Surgical History:  Procedure Laterality Date   ABDOMINAL HYSTERECTOMY  09/28/1999   for leimyomas   BILATERAL SALPINGOOPHORECTOMY  05/08/2001   Path=extensive endometriosis   BREAST SURGERY  12/29/2003   Right breast core biopsy=benign fibrocystic change   COLONOSCOPY  08/23/2011   Dr.Magod   SMALL INTESTINE SURGERY  03/30/2001   Rectosigmoid bowel resection and reanastomosis   UPPER GASTROINTESTINAL ENDOSCOPY      Allergies  Allergies  Allergen Reactions   Percocet [Oxycodone-Acetaminophen] Nausea And Vomiting   Codeine Phosphate Itching    History of Present Illness    [name] comes today at the request of physicians at [facility] clinic due to dizziness.  states she was climbing 3 flights of stairs quickly to her apartment and by the time she got to the top of the stairs and walked into her apartment she had a near syncopal episode.  She states she was breathing very hard and got very dizzy.  This passed quickly when her breathing was slowed down.  She denied any chest pain, palpitations, or headaches.  Home Medications    Current Outpatient Medications  Medication Sig Dispense  Refill   acetaminophen (TYLENOL 8 HOUR) 650 MG CR tablet Take 1 tablet (650 mg total) by mouth every 6 (six) hours as needed for pain. 60 tablet 0   aspirin 81 MG tablet Take 1 tablet (81 mg total) by mouth daily. 90 tablet 3   atorvastatin (LIPITOR) 40 MG tablet TAKE 1 TABLET BY MOUTH EVERY DAY 90 tablet 3   benzonatate (TESSALON PERLES) 100 MG capsule Take 1 capsule (100 mg total) by mouth 3 (three) times daily as needed for cough. 20 capsule 0   Blood Pressure Monitoring (ADULT BLOOD PRESSURE CUFF LG) KIT 1 kit by Does not apply route 2 (two) times daily. 1 kit 0   carvedilol (COREG) 3.125 MG tablet TAKE 1 TABLET BY MOUTH TWICE A DAY WITH FOOD 180 tablet 0   cetirizine (ZYRTEC) 10 MG tablet Take 1 tablet (10 mg total) by mouth daily. 30 tablet 3   diclofenac Sodium (VOLTAREN) 1 % GEL APPLY 2 GRAMS TO AFFECTED AREA 4 TIMES A DAY 300 g 1   Emollient (EUCERIN) lotion Apply topically as needed for dry skin.     famotidine (PEPCID) 40 MG tablet TAKE 1 TABLET BY MOUTH EVERYDAY AT BEDTIME 90 tablet 2   gabapentin (NEURONTIN) 100 MG capsule Take 2 capsules (200 mg total) by mouth at bedtime. 90 capsule 1   hydrOXYzine (ATARAX) 10 MG tablet Take 1 tablet (10 mg total) by mouth at  bedtime as needed for itching. 10 tablet 0   ipratropium (ATROVENT) 0.06 % nasal spray Place 2 sprays into both nostrils 4 (four) times daily. (Patient taking differently: Place 2 sprays into both nostrils as needed.) 15 mL 0   methylPREDNISolone (MEDROL DOSEPAK) 4 MG TBPK tablet Take as directed per package instructions 21 tablet 0   olopatadine (PATANOL) 0.1 % ophthalmic solution Place 1 drop into both eyes 2 (two) times daily as needed (seasonal allergies/itching).      polyethylene glycol powder (GLYCOLAX/MIRALAX) 17 GM/SCOOP powder Dissolve 17grams in at at least 32 ounces of water or juice daily as needed. 255 g 0   RABEprazole (ACIPHEX) 20 MG tablet TAKE 1 TABLET (20 MG TOTAL) BY MOUTH IN THE MORNING AND AT BEDTIME 180  tablet 1   senna (SENOKOT) 8.6 MG TABS tablet Take 2 tablets (17.2 mg total) by mouth daily as needed for mild constipation. 30 tablet 0   Skin Protectants, Misc. (EUCERIN) cream Apply topically as needed for dry skin.     Soap & Cleansers (NEUTROGENA FACIAL SOAP) BAR Apply 1 each topically in the morning and at bedtime. 1 each 2   triamcinolone ointment (KENALOG) 0.1 % APPLY TO AFFECTED AREA TWICE A DAY 30 g 0   XIIDRA 5 % SOLN Apply 1 drop to eye 2 (two) times daily.     No current facility-administered medications for this visit.     Family History    Family History  Problem Relation Age of Onset   Hypertension Mother    Diabetes Father    Diabetes Sister    Asthma Brother    Diabetes Brother    Kidney disease Brother    Asthma Brother    Diabetes Brother    Colon cancer Neg Hx    Colon polyps Neg Hx    Esophageal cancer Neg Hx    Breast cancer Neg Hx    Pancreatic cancer Neg Hx    Stomach cancer Neg Hx    Rectal cancer Neg Hx    She indicated that her mother is deceased. She indicated that her father is deceased. She indicated that both of her sisters are alive. She indicated that only one of her four brothers is alive. She indicated that the status of her neg hx is unknown.  Social History    Social History   Socioeconomic History   Marital status: Divorced    Spouse name: Not on file   Number of children: 0   Years of education: 12   Highest education level: 12th grade  Occupational History   Occupation: Disability  Tobacco Use   Smoking status: Never   Smokeless tobacco: Never  Vaping Use   Vaping status: Never Used  Substance and Sexual Activity   Alcohol use: Not Currently    Comment: 1 drink very rarely   Drug use: No   Sexual activity: Not Currently    Birth control/protection: Abstinence  Other Topics Concern   Not on file  Social History Narrative   Patient lives alone. Patient does not have any animals.    Social Determinants of Health    Financial Resource Strain: Low Risk  (03/17/2021)   Overall Financial Resource Strain (CARDIA)    Difficulty of Paying Living Expenses: Not hard at all  Food Insecurity: No Food Insecurity (03/17/2021)   Hunger Vital Sign    Worried About Running Out of Food in the Last Year: Never true    Ran Out of Food in the  Last Year: Never true  Transportation Needs: No Transportation Needs (03/17/2021)   PRAPARE - Administrator, Civil Service (Medical): No    Lack of Transportation (Non-Medical): No  Physical Activity: Inactive (03/17/2021)   Exercise Vital Sign    Days of Exercise per Week: 0 days    Minutes of Exercise per Session: 0 min  Stress: Stress Concern Present (03/17/2021)   [employer] of Occupational Health - Occupational Stress Questionnaire    Feeling of Stress : To some extent  Social Connections: Moderately Isolated (03/17/2021)   Social Connection and Isolation Panel [NHANES]    Frequency of Communication with Friends and Family: More than three times a week    Frequency of Social Gatherings with Friends and Family: Three times a week    Attends Religious Services: More than 4 times per year    Active Member of Clubs or Organizations: No    Attends Banker Meetings: Never    Marital Status: Divorced  Catering manager Violence: Not At Risk (03/17/2021)   Humiliation, Afraid, Rape, and Kick questionnaire    Fear of Current or Ex-Partner: No    Emotionally Abused: No    Physically Abused: No    Sexually Abused: No     Review of Systems    General:  No chills, fever, night sweats or weight changes.  Cardiovascular:  No chest pain, dyspnea on exertion, edema, orthopnea, palpitations, paroxysmal nocturnal dyspnea. Dermatological: No rash, lesions/masses Respiratory: No cough, dyspnea Urologic: No hematuria, dysuria Abdominal:   No nausea, vomiting, diarrhea, bright red blood per rectum, melena, or hematemesis Neurologic:  No visual changes,  wkns, changes in mental status. All other systems reviewed and are otherwise negative except as noted above.  EKG Interpretation Date/Time:  Thursday April 25 2023 14:10:21 EDT Ventricular Rate:  83 PR Interval:  158 QRS Duration:  90 QT Interval:  376 QTC Calculation: 441 R Axis:   16  Text Interpretation: Normal sinus rhythm Normal ECG When compared with ECG of 22-Sep-2019 20:27, QRS axis Shifted right Confirmed by Joni Reining [id number]) on 04/25/2023 2:31:27 PM    Physical Exam    VS:  BP 134/72   Pulse 81   Ht 5\' 1"  (1.549 m)   Wt 177 lb 9.6 oz (80.6 kg)   SpO2 95%   BMI 33.56 kg/m  , BMI Body mass index is 33.56 kg/m.     GEN: Well nourished, well developed, in no acute distress. HEENT: normal. Neck: Supple, no JVD, carotid bruits, or masses. Cardiac: RRR, no murmurs, rubs, or gallops. No clubbing, cyanosis, 1+ bilateral pretibial edema.  Varicosities noted especially on the right.  Radials/DP/PT 2+ and equal bilaterally.  Respiratory:  Respirations regular and unlabored, clear to auscultation bilaterally. GI: Soft, nontender, nondistended, BS + x 4. MS: no deformity or atrophy. Skin: warm and dry, no rash. Neuro:  Strength and sensation are intact. Psych: Normal affect.  EKG Interpretation Date/Time:  Thursday April 25 2023 14:10:21 EDT Ventricular Rate:  83 PR Interval:  158 QRS Duration:  90 QT Interval:  376 QTC Calculation: 441 R Axis:   16  Text Interpretation: Normal sinus rhythm Normal ECG When compared with ECG of 22-Sep-2019 20:27, QRS axis Shifted right Confirmed by Joni Reining [phone]) on 04/25/2023 2:31:27 PM   Lab Results  Component Value Date   WBC 6.3 03/17/2022   HGB 11.8 (L) 03/17/2022   HCT 36.5 03/17/2022   MCV 86.5 03/17/2022   PLT 263 03/17/2022  Lab Results  Component Value Date   CREATININE 0.75 03/17/2022   BUN 13 03/17/2022   NA 141 03/17/2022   K 3.5 03/17/2022   CL 111 03/17/2022   CO2 24 03/17/2022   Lab  Results  Component Value Date   ALT 13 03/17/2022   AST 20 03/17/2022   ALKPHOS 86 03/17/2022   BILITOT 0.7 03/17/2022   Lab Results  Component Value Date   CHOL 101 03/18/2020   HDL 53 03/18/2020   LDLCALC 33 03/18/2020   TRIG 69 03/18/2020   CHOLHDL 1.9 03/18/2020    Lab Results  Component Value Date   HGBA1C 5.4 01/24/2022     Review of Prior Studies EKG Interpretation Date/Time:  Thursday April 25 2023 14:10:21 EDT Ventricular Rate:  83 PR Interval:  158 QRS Duration:  90 QT Interval:  376 QTC Calculation: 441 R Axis:   16  Text Interpretation: Normal sinus rhythm Normal ECG When compared with ECG of 22-Sep-2019 20:27, QRS axis Shifted right Confirmed by Joni Reining [id number]) on 04/25/2023 2:31:27 PM  Coronary CTA 11/01/2021  IMPRESSION: 1. No evidence of CAD, CADRADS = 0.   2. Coronary calcium score of 0. This was 0 percentile for age and sex matched control.   3. Normal coronary origin with right dominance.   4. Consider non-coronary causes of chest pain.  Assessment & Plan   1.  Near syncope: I believe this was related to hyperventilation climbing 3 flights of stairs quickly.  She states that once she sat down this quickly passed when her breathing became more normal.  She was racing up the stairs.  I am not hearing any carotid bruits, heart murmurs, or irregular heart rhythm on examination.  If this reoccurs may need to consider repeating echocardiogram and/or cardiac monitor.  Most recent echocardiogram in 2023 revealed normal LVEF of 60 to 65% with indeterminant diastolic function.   2.  Hypertension: Blood pressure is well-controlled today on current medication regimen.  She remains on carvedilol 3.125 mg twice daily.  She is not on any other antihypertensives currently.  No changes.  3.  Hypercholesterolemia: She remains on atorvastatin 40 mg daily.  Labs are completed by PCP [facility] at [facility].         Signed, Bettey Mare. Liborio Nixon, ANP,  AACC   04/25/2023 2:32 PM      Office [phone] Fax [phone]  Notice: This dictation was prepared with Dragon dictation along with smaller phrase technology. Any transcriptional errors that result from this process are unintentional and may not be corrected upon review.

## 2023-04-25 ENCOUNTER — Encounter: Payer: Self-pay | Admitting: Adult Health

## 2023-04-25 ENCOUNTER — Ambulatory Visit: Payer: Medicare HMO | Attending: Adult Health | Admitting: Adult Health

## 2023-04-25 VITALS — BP 134/72 | HR 81 | Ht 61.0 in | Wt 177.6 lb

## 2023-04-25 DIAGNOSIS — R42 Dizziness and giddiness: Secondary | ICD-10-CM

## 2023-04-25 DIAGNOSIS — E7800 Pure hypercholesterolemia, unspecified: Secondary | ICD-10-CM | POA: Diagnosis not present

## 2023-04-25 DIAGNOSIS — I10 Essential (primary) hypertension: Secondary | ICD-10-CM

## 2023-04-25 NOTE — Patient Instructions (Signed)
Medication Instructions:  NO CHANGES    Lab Work: NONE   Testing/Procedures: NONE   Follow-Up: At [facility], you and your health needs are our priority.  As part of our continuing mission to provide you with exceptional heart care, we have created designated Provider Care Teams.  These Care Teams include your primary Cardiologist (physician) and Advanced Practice Providers (APPs -  Physician Assistants and Nurse Practitioners) who all work together to provide you with the care you need, when you need it.   Your next appointment:   1 year(s)  Provider:   Donato Schultz, MD

## 2023-05-14 ENCOUNTER — Encounter (HOSPITAL_COMMUNITY): Payer: Medicare HMO

## 2023-06-07 ENCOUNTER — Other Ambulatory Visit: Payer: Self-pay

## 2023-06-07 DIAGNOSIS — R600 Localized edema: Secondary | ICD-10-CM

## 2023-06-19 NOTE — Progress Notes (Unsigned)
Office Note     CC: Pain in the right popliteal fossa Requesting Provider:  Karenann Cai, NP  HPI: Megan Miller is a 67 y.o. (January 20, 1956) female who presents at the request of Levin Erp, MD for evaluation of pain in the right popliteal fossa.  On exam, Megan Miller was doing well.  A native of [place], she graduated from [employer].  She worked prior to having her stroke, however after stroke, had to go on disability. Since her stroke, she has had a palpable knot in the right popliteal fossa which she says waxes and wanes, and is sometimes painful.  She notices the pain at various times.   There are no inciting factors, no significant ways to get immediate pain relief.  Megan Miller is also appreciated waxing waning right lower extremity swelling since her stroke.  She notes heaviness with mild edema. Denies symptoms of claudication, ischemic rest pain, tissue loss.   Megan Miller also appreciated some telangiectasias at the popliteal fossa. No prior history of DVT, venous disease, vascular surgery No history of compression stockings   Past Medical History:  Diagnosis Date   Allergic conjunctivitis    Arthritis    Chest pain    Chronic back pain    Constipation, chronic    Dyspnea on exertion    Dysthymic    GERD (gastroesophageal reflux disease)    Hyperlipidemia    Hypertension    Leg edema    Asymetric Right leg (dopplers negative)   Overweight (BMI 25.0-29.9)    Stroke (HCC) 2019   Urination frequency     Past Surgical History:  Procedure Laterality Date   ABDOMINAL HYSTERECTOMY  09/28/1999   for leimyomas   BILATERAL SALPINGOOPHORECTOMY  05/08/2001   Path=extensive endometriosis   BREAST SURGERY  12/29/2003   Right breast core biopsy=benign fibrocystic change   COLONOSCOPY  08/23/2011   Dr.Magod   SMALL INTESTINE SURGERY  03/30/2001   Rectosigmoid bowel resection and reanastomosis   UPPER GASTROINTESTINAL ENDOSCOPY      Social History    Socioeconomic History   Marital status: Divorced    Spouse name: Not on file   Number of children: 0   Years of education: 12   Highest education level: 12th grade  Occupational History   Occupation: Disability  Tobacco Use   Smoking status: Never   Smokeless tobacco: Never  Vaping Use   Vaping status: Never Used  Substance and Sexual Activity   Alcohol use: Not Currently    Comment: 1 drink very rarely   Drug use: No   Sexual activity: Not Currently    Birth control/protection: Abstinence  Other Topics Concern   Not on file  Social History Narrative   Patient lives alone. Patient does not have any animals.    Social Determinants of Health   Financial Resource Strain: Low Risk  (03/17/2021)   Overall Financial Resource Strain (CARDIA)    Difficulty of Paying Living Expenses: Not hard at all  Food Insecurity: No Food Insecurity (03/17/2021)   Hunger Vital Sign    Worried About Running Out of Food in the Last Year: Never true    Ran Out of Food in the Last Year: Never true  Transportation Needs: No Transportation Needs (03/17/2021)   PRAPARE - Administrator, Civil Service (Medical): No    Lack of Transportation (Non-Medical): No  Physical Activity: Inactive (03/17/2021)   Exercise Vital Sign    Days of Exercise per Week: 0 days  Minutes of Exercise per Session: 0 min  Stress: Stress Concern Present (03/17/2021)   [employer] of Occupational Health - Occupational Stress Questionnaire    Feeling of Stress : To some extent  Social Connections: Moderately Isolated (03/17/2021)   Social Connection and Isolation Panel [NHANES]    Frequency of Communication with Friends and Family: More than three times a week    Frequency of Social Gatherings with Friends and Family: Three times a week    Attends Religious Services: More than 4 times per year    Active Member of Clubs or Organizations: No    Attends Banker Meetings: Never    Marital Status:  Divorced  Catering manager Violence: Not At Risk (03/17/2021)   Humiliation, Afraid, Rape, and Kick questionnaire    Fear of Current or Ex-Partner: No    Emotionally Abused: No    Physically Abused: No    Sexually Abused: No   Family History  Problem Relation Age of Onset   Hypertension Mother    Diabetes Father    Diabetes Sister    Asthma Brother    Diabetes Brother    Kidney disease Brother    Asthma Brother    Diabetes Brother    Colon cancer Neg Hx    Colon polyps Neg Hx    Esophageal cancer Neg Hx    Breast cancer Neg Hx    Pancreatic cancer Neg Hx    Stomach cancer Neg Hx    Rectal cancer Neg Hx     Current Outpatient Medications  Medication Sig Dispense Refill   acetaminophen (TYLENOL 8 HOUR) 650 MG CR tablet Take 1 tablet (650 mg total) by mouth every 6 (six) hours as needed for pain. 60 tablet 0   aspirin 81 MG tablet Take 1 tablet (81 mg total) by mouth daily. 90 tablet 3   atorvastatin (LIPITOR) 40 MG tablet TAKE 1 TABLET BY MOUTH EVERY DAY 90 tablet 3   benzonatate (TESSALON PERLES) 100 MG capsule Take 1 capsule (100 mg total) by mouth 3 (three) times daily as needed for cough. 20 capsule 0   Blood Pressure Monitoring (ADULT BLOOD PRESSURE CUFF LG) KIT 1 kit by Does not apply route 2 (two) times daily. 1 kit 0   carvedilol (COREG) 3.125 MG tablet TAKE 1 TABLET BY MOUTH TWICE A DAY WITH FOOD 180 tablet 0   cetirizine (ZYRTEC) 10 MG tablet Take 1 tablet (10 mg total) by mouth daily. 30 tablet 3   diclofenac Sodium (VOLTAREN) 1 % GEL APPLY 2 GRAMS TO AFFECTED AREA 4 TIMES A DAY 300 g 1   Emollient (EUCERIN) lotion Apply topically as needed for dry skin.     famotidine (PEPCID) 40 MG tablet TAKE 1 TABLET BY MOUTH EVERYDAY AT BEDTIME 90 tablet 2   gabapentin (NEURONTIN) 100 MG capsule Take 2 capsules (200 mg total) by mouth at bedtime. 90 capsule 1   hydrOXYzine (ATARAX) 10 MG tablet Take 1 tablet (10 mg total) by mouth at bedtime as needed for itching. 10 tablet 0    ipratropium (ATROVENT) 0.06 % nasal spray Place 2 sprays into both nostrils 4 (four) times daily. (Patient taking differently: Place 2 sprays into both nostrils as needed.) 15 mL 0   methylPREDNISolone (MEDROL DOSEPAK) 4 MG TBPK tablet Take as directed per package instructions 21 tablet 0   olopatadine (PATANOL) 0.1 % ophthalmic solution Place 1 drop into both eyes 2 (two) times daily as needed (seasonal allergies/itching).  polyethylene glycol powder (GLYCOLAX/MIRALAX) 17 GM/SCOOP powder Dissolve 17grams in at at least 32 ounces of water or juice daily as needed. 255 g 0   RABEprazole (ACIPHEX) 20 MG tablet TAKE 1 TABLET (20 MG TOTAL) BY MOUTH IN THE MORNING AND AT BEDTIME 180 tablet 1   senna (SENOKOT) 8.6 MG TABS tablet Take 2 tablets (17.2 mg total) by mouth daily as needed for mild constipation. 30 tablet 0   Skin Protectants, Misc. (EUCERIN) cream Apply topically as needed for dry skin.     Soap & Cleansers (NEUTROGENA FACIAL SOAP) BAR Apply 1 each topically in the morning and at bedtime. 1 each 2   triamcinolone ointment (KENALOG) 0.1 % APPLY TO AFFECTED AREA TWICE A DAY 30 g 0   XIIDRA 5 % SOLN Apply 1 drop to eye 2 (two) times daily.     No current facility-administered medications for this visit.    Allergies  Allergen Reactions   Percocet [Oxycodone-Acetaminophen] Nausea And Vomiting   Codeine Phosphate Itching     REVIEW OF SYSTEMS:  [X]  denotes positive finding, [ ]  denotes negative finding Cardiac  Comments:  Chest pain or chest pressure:    Shortness of breath upon exertion:    Short of breath when lying flat:    Irregular heart rhythm:        Vascular    Pain in calf, thigh, or hip brought on by ambulation:    Pain in feet at night that wakes you up from your sleep:     Blood clot in your veins:    Leg swelling:         Pulmonary    Oxygen at home:    Productive cough:     Wheezing:         Neurologic    Sudden weakness in arms or legs:     Sudden  numbness in arms or legs:     Sudden onset of difficulty speaking or slurred speech:    Temporary loss of vision in one eye:     Problems with dizziness:         Gastrointestinal    Blood in stool:     Vomited blood:         Genitourinary    Burning when urinating:     Blood in urine:        Psychiatric    Major depression:         Hematologic    Bleeding problems:    Problems with blood clotting too easily:        Skin    Rashes or ulcers:        Constitutional    Fever or chills:      PHYSICAL EXAMINATION:  There were no vitals filed for this visit.  General:  WDWN in NAD; vital signs documented above Gait: Not observed HENT: WNL, normocephalic Pulmonary: normal non-labored breathing , without Rales, rhonchi,  wheezing Cardiac: regular HR, Abdomen: soft, NT, no masses Skin: without rashes Vascular Exam/Pulses:  Right Left  Radial 2+ (normal) 2+ (normal)  Ulnar    Femoral    Popliteal    DP 2+ (normal) 2+ (normal)  PT     Extremities: without ischemic changes, without Gangrene , without cellulitis; without open wounds;  Musculoskeletal: no muscle wasting or atrophy  Neurologic: A&O X 3;  No focal weakness or paresthesias are detected Psychiatric:  The pt has Normal affect.   Non-Invasive Vascular Imaging:   -  ASSESSMENT/PLAN:: 67 y.o. female presenting with waxing waning pain in the right popliteal fossa.   On physical exam, there were some superficial telangiectasias present.  I was unable to palpate a mass.  No significant edema Venous duplex ultrasound was reviewed demonstrating no significant reflux, however there was a fluid collection appreciated behind the knee which is likely a Baker's cyst.   Lower extremity swelling is common in the affected limb and patient status post stroke.  This is due to some difficulty with mobilization of lymphatic fluid.  On exam, bilateral legs were similar in size.  Very little edema appreciated.  Should edema  worsen, she would benefit from referral to the lymphedema clinic in [facility]. At this time, I think this can continue to be managed conservatively with elevation and compression.  I had a long conversation with Megan Miller regarding the above.  Her vascular and venous systems are normal. We discussed that she could wear compression stockings to help with her edema. Should the pain continue, I will recommend orthopedic surgery evaluation for draining the Baker's cyst.   Victorino Sparrow, MD [facility] [phone]

## 2023-06-20 ENCOUNTER — Encounter: Payer: Self-pay | Admitting: Vascular Surgery

## 2023-06-20 ENCOUNTER — Ambulatory Visit (HOSPITAL_COMMUNITY)
Admission: RE | Admit: 2023-06-20 | Discharge: 2023-06-20 | Disposition: A | Discharge status: Home / Self Care | Payer: Medicare HMO | Source: Ambulatory Visit | Attending: Vascular Surgery | Admitting: Vascular Surgery

## 2023-06-20 ENCOUNTER — Ambulatory Visit: Payer: Medicare HMO | Admitting: Vascular Surgery

## 2023-06-20 VITALS — BP 139/82 | HR 74 | Temp 98.1°F | Resp 20 | Ht 61.0 in | Wt 178.0 lb

## 2023-06-20 DIAGNOSIS — M25561 Pain in right knee: Secondary | ICD-10-CM | POA: Diagnosis not present

## 2023-06-20 DIAGNOSIS — R600 Localized edema: Secondary | ICD-10-CM | POA: Diagnosis present

## 2023-06-20 DIAGNOSIS — G8929 Other chronic pain: Secondary | ICD-10-CM | POA: Diagnosis not present

## 2023-07-17 ENCOUNTER — Other Ambulatory Visit: Payer: Self-pay | Admitting: Student

## 2023-07-18 ENCOUNTER — Ambulatory Visit: Payer: Medicare HMO

## 2023-07-18 VITALS — Ht 61.0 in | Wt 178.0 lb

## 2023-07-18 DIAGNOSIS — Z1211 Encounter for screening for malignant neoplasm of colon: Secondary | ICD-10-CM

## 2023-07-18 DIAGNOSIS — Z0000 Encounter for general adult medical examination without abnormal findings: Secondary | ICD-10-CM | POA: Diagnosis not present

## 2023-07-18 NOTE — Addendum Note (Signed)
Addended by: Levin Erp on: 07/18/2023 03:06 PM   Modules accepted: Orders

## 2023-07-18 NOTE — Progress Notes (Addendum)
Subjective:   Megan Miller is a 67 y.o. female who presents for Medicare Annual (Subsequent) preventive examination.  Visit Complete: Virtual I connected with  Alaria L Gatchalian on 07/18/23 by a audio enabled telemedicine application and verified that I am speaking with the correct person using two identifiers.  Patient Location: Home  Provider Location: Home Office  I discussed the limitations of evaluation and management by telemedicine. The patient expressed understanding and agreed to proceed.  Vital Signs: Because this visit was a virtual/telehealth visit, some criteria may be missing or patient reported. Any vitals not documented were not able to be obtained and vitals that have been documented are patient reported.  Cardiac Risk Factors include: advanced age (>31men, >48 women);dyslipidemia;family history of premature cardiovascular disease;hypertension;obesity (BMI >30kg/m2)     Objective:    Today's Vitals   07/18/23 1147 07/18/23 1150  Weight: 178 lb (80.7 kg)   Height: 5\' 1"  (1.549 m)   PainSc: 9  9   PainLoc: Leg    Body mass index is 33.63 kg/m.     07/18/2023   11:28 AM 07/18/2022   10:59 AM 07/06/2022   11:37 AM 05/14/2022    2:22 PM 04/27/2022   11:10 AM 03/17/2022    1:34 PM 03/12/2022    3:52 PM  Advanced Directives  Does Patient Have a Medical Advance Directive? No No No No No No No  Would patient like information on creating a medical advance directive? No - Patient declined No - Patient declined No - Patient declined No - Patient declined No - Patient declined No - Patient declined No - Patient declined    Current Medications (verified) Outpatient Encounter Medications as of 07/18/2023  Medication Sig   acetaminophen (TYLENOL 8 HOUR) 650 MG CR tablet Take 1 tablet (650 mg total) by mouth every 6 (six) hours as needed for pain.   aspirin 81 MG tablet Take 1 tablet (81 mg total) by mouth daily.   atorvastatin (LIPITOR) 40 MG tablet TAKE 1  TABLET BY MOUTH EVERY DAY   benzonatate (TESSALON PERLES) 100 MG capsule Take 1 capsule (100 mg total) by mouth 3 (three) times daily as needed for cough.   Blood Pressure Monitoring (ADULT BLOOD PRESSURE CUFF LG) KIT 1 kit by Does not apply route 2 (two) times daily.   carvedilol (COREG) 3.125 MG tablet TAKE 1 TABLET BY MOUTH TWICE A DAY WITH FOOD   cetirizine (ZYRTEC) 10 MG tablet Take 1 tablet (10 mg total) by mouth daily.   diclofenac Sodium (VOLTAREN) 1 % GEL APPLY 2 GRAMS TO AFFECTED AREA 4 TIMES A DAY   Emollient (EUCERIN) lotion Apply topically as needed for dry skin.   famotidine (PEPCID) 40 MG tablet TAKE 1 TABLET BY MOUTH EVERYDAY AT BEDTIME   gabapentin (NEURONTIN) 100 MG capsule Take 2 capsules (200 mg total) by mouth at bedtime.   hydrOXYzine (ATARAX) 10 MG tablet Take 1 tablet (10 mg total) by mouth at bedtime as needed for itching.   ipratropium (ATROVENT) 0.06 % nasal spray Place 2 sprays into both nostrils 4 (four) times daily. (Patient taking differently: Place 2 sprays into both nostrils as needed.)   methylPREDNISolone (MEDROL DOSEPAK) 4 MG TBPK tablet Take as directed per package instructions   olopatadine (PATANOL) 0.1 % ophthalmic solution Place 1 drop into both eyes 2 (two) times daily as needed (seasonal allergies/itching).    polyethylene glycol powder (GLYCOLAX/MIRALAX) 17 GM/SCOOP powder Dissolve 17grams in at at least 32 ounces of water  or juice daily as needed.   RABEprazole (ACIPHEX) 20 MG tablet TAKE 1 TABLET (20 MG TOTAL) BY MOUTH IN THE MORNING AND AT BEDTIME   senna (SENOKOT) 8.6 MG TABS tablet Take 2 tablets (17.2 mg total) by mouth daily as needed for mild constipation.   Skin Protectants, Misc. (EUCERIN) cream Apply topically as needed for dry skin.   Soap & Cleansers (NEUTROGENA FACIAL SOAP) BAR Apply 1 each topically in the morning and at bedtime.   triamcinolone ointment (KENALOG) 0.1 % APPLY TO AFFECTED AREA TWICE A DAY   XIIDRA 5 % SOLN Apply 1 drop to  eye 2 (two) times daily.   No facility-administered encounter medications on file as of 07/18/2023.    Allergies (verified) Percocet [oxycodone-acetaminophen] and Codeine phosphate   History: Past Medical History:  Diagnosis Date   Allergic conjunctivitis    Arthritis    Chest pain    Chronic back pain    Constipation, chronic    Dyspnea on exertion    Dysthymic    GERD (gastroesophageal reflux disease)    Hyperlipidemia    Hypertension    Leg edema    Asymetric Right leg (dopplers negative)   Overweight (BMI 25.0-29.9)    Stroke (HCC) 2019   Urination frequency    Past Surgical History:  Procedure Laterality Date   ABDOMINAL HYSTERECTOMY  09/28/1999   for leimyomas   BILATERAL SALPINGOOPHORECTOMY  05/08/2001   Path=extensive endometriosis   BREAST SURGERY  12/29/2003   Right breast core biopsy=benign fibrocystic change   COLONOSCOPY  08/23/2011   Dr.Magod   SMALL INTESTINE SURGERY  03/30/2001   Rectosigmoid bowel resection and reanastomosis   UPPER GASTROINTESTINAL ENDOSCOPY     Family History  Problem Relation Age of Onset   Hypertension Mother    Diabetes Father    Diabetes Sister    Asthma Brother    Diabetes Brother    Kidney disease Brother    Asthma Brother    Diabetes Brother    Colon cancer Neg Hx    Colon polyps Neg Hx    Esophageal cancer Neg Hx    Breast cancer Neg Hx    Pancreatic cancer Neg Hx    Stomach cancer Neg Hx    Rectal cancer Neg Hx    Social History   Socioeconomic History   Marital status: Divorced    Spouse name: Not on file   Number of children: 0   Years of education: 12   Highest education level: 12th grade  Occupational History   Occupation: Disability  Tobacco Use   Smoking status: Never   Smokeless tobacco: Never  Vaping Use   Vaping status: Never Used  Substance and Sexual Activity   Alcohol use: Not Currently    Comment: 1 drink very rarely   Drug use: No   Sexual activity: Not Currently    Birth  control/protection: Abstinence  Other Topics Concern   Not on file  Social History Narrative   Patient lives alone. Patient does not have any animals.    Social Drivers of Corporate investment banker Strain: High Risk (07/18/2023)   Overall Financial Resource Strain (CARDIA)    Difficulty of Paying Living Expenses: Very hard  Food Insecurity: No Food Insecurity (07/18/2023)   Hunger Vital Sign    Worried About Running Out of Food in the Last Year: Never true    Ran Out of Food in the Last Year: Never true  Transportation Needs: No Transportation Needs (07/18/2023)  PRAPARE - Administrator, Civil Service (Medical): No    Lack of Transportation (Non-Medical): No  Physical Activity: Sufficiently Active (07/18/2023)   Exercise Vital Sign    Days of Exercise per Week: 5 days    Minutes of Exercise per Session: 30 min  Stress: Stress Concern Present (07/18/2023)   [employer] of Occupational Health - Occupational Stress Questionnaire    Feeling of Stress : Very much  Social Connections: Moderately Isolated (07/18/2023)   Social Connection and Isolation Panel [NHANES]    Frequency of Communication with Friends and Family: More than three times a week    Frequency of Social Gatherings with Friends and Family: Three times a week    Attends Religious Services: More than 4 times per year    Active Member of Clubs or Organizations: No    Attends Banker Meetings: Never    Marital Status: Divorced    Tobacco Counseling Counseling given: Not Answered   Clinical Intake:  Pre-visit preparation completed: Yes  Pain : 0-10 Pain Score: 9  Pain Type: Chronic pain Pain Location: Leg Pain Orientation: Left, Right Pain Descriptors / Indicators: Aching     BMI - recorded: 33.63 Nutritional Status: BMI > 30  Obese Nutritional Risks: None Diabetes: No  How often do you need to have someone help you when you read instructions, pamphlets, or other written  materials from your doctor or pharmacy?: 1 - Never What is the last grade level you completed in school?: HSG  Interpreter Needed?: No  Information entered by :: Susie Cassette, LPN.   Activities of Daily Living    07/18/2023   11:58 AM  In your present state of health, do you have any difficulty performing the following activities:  Hearing? 0  Vision? 0  Difficulty concentrating or making decisions? 1  Walking or climbing stairs? 1  Comment severe leg pain  Dressing or bathing? 0  Doing errands, shopping? 0  Preparing Food and eating ? N  Using the Toilet? N  In the past six months, have you accidently leaked urine? N  Do you have problems with loss of bowel control? N  Managing your Medications? N  Managing your Finances? N  Housekeeping or managing your Housekeeping? N    Patient Care Team: Levin Erp, MD as PCP - General (Family Medicine) Jake Bathe, MD as PCP - Cardiology (Cardiology) Conley Rolls, [facility] [place], [place] as Referring Physician (Optometry)  Indicate any recent Medical Services you may have received from other than [facility] providers in the past year (date may be approximate).     Assessment:   This is a routine wellness examination for Akylah.  Hearing/Vision screen Hearing Screening - Comments:: Denies hearing difficulties.   Vision Screening - Comments:: Wears rx glasses - up to date with routine eye exams with [facility], [facility].    Goals Addressed             This Visit's Progress    Patient Stated       My goal is to feel better.      Depression Screen    07/18/2023   11:32 AM 07/18/2022   10:59 AM 07/06/2022   11:39 AM 05/14/2022    2:22 PM 04/27/2022   11:12 AM 03/12/2022    3:51 PM 02/20/2022    9:01 AM  PHQ 2/9 Scores  PHQ - 2 Score 6 0 2 0 2 1 1   PHQ- 9 Score 13 0 5 1 3  3 4    Fall Risk    07/18/2023   11:28 AM 07/18/2022   10:59 AM 07/06/2022   11:37 AM 05/14/2022    2:22 PM 04/27/2022   11:12 AM  Fall Risk   Falls in the past  year? 0 0 0 0 0  Number falls in past yr: 0 0  0   Injury with Fall? 0 0  0   Risk for fall due to : No Fall Risks      Follow up Falls prevention discussed        MEDICARE RISK AT HOME: Medicare Risk at Home Any stairs in or around the home?: Yes If so, are there any without handrails?: No Home free of loose throw rugs in walkways, pet beds, electrical cords, etc?: Yes Adequate lighting in your home to reduce risk of falls?: Yes Life alert?: No Use of a cane, walker or w/c?: Yes Grab bars in the bathroom?: No Shower chair or bench in shower?: No Elevated toilet seat or a handicapped toilet?: No  TIMED UP AND GO:  Was the test performed?  No    Cognitive Function:        07/18/2023   11:59 AM 03/17/2021   10:45 AM  6CIT Screen  What Year? 0 points 0 points  What month? 0 points 0 points  What time? 0 points 0 points  Count back from 20 0 points 0 points  Months in reverse 0 points 2 points  Repeat phrase 0 points 2 points  Total Score 0 points 4 points    Immunizations Immunization History  Administered Date(s) Administered   Fluad Quad(high Dose 65+) 09/07/2020, 07/18/2022   Influenza Split 04/18/2011, 06/19/2012   Influenza Whole 05/27/2009   Influenza,inj,Quad PF,6+ Mos 09/09/2013, 04/22/2014, 09/28/2016, 05/09/2017   PFIZER Comirnaty(Gray Top)Covid-19 Tri-Sucrose Vaccine 08/23/2020, 05/27/2023   PFIZER(Purple Top)SARS-COV-2 Vaccination 10/08/2019, 10/30/2019   Pfizer Covid-19 Vaccine Bivalent Booster 73yrs & up 06/01/2021   Td 02/27/2002   Tdap 03/18/2013    TDAP status: Due, Education has been provided regarding the importance of this vaccine. Advised may receive this vaccine at local pharmacy or Health Dept. Aware to provide a copy of the vaccination record if obtained from local pharmacy or Health Dept. Verbalized acceptance and understanding.  Flu Vaccine status: Due, Education has been provided regarding the importance of this vaccine. Advised may  receive this vaccine at local pharmacy or Health Dept. Aware to provide a copy of the vaccination record if obtained from local pharmacy or Health Dept. Verbalized acceptance and understanding.  Pneumococcal vaccine status: Due, Education has been provided regarding the importance of this vaccine. Advised may receive this vaccine at local pharmacy or Health Dept. Aware to provide a copy of the vaccination record if obtained from local pharmacy or Health Dept. Verbalized acceptance and understanding.  Covid-19 vaccine status: Completed vaccines  Qualifies for Shingles Vaccine? Yes   Zostavax completed No   Shingrix Completed?: No.    Education has been provided regarding the importance of this vaccine. Patient has been advised to call insurance company to determine out of pocket expense if they have not yet received this vaccine. Advised may also receive vaccine at local pharmacy or Health Dept. Verbalized acceptance and understanding.  Screening Tests Health Maintenance  Topic Date Due   Pneumonia Vaccine 5+ Years old (1 of 2 - PCV) Never done   Zoster Vaccines- Shingrix (1 of 2) Never done   INFLUENZA VACCINE  02/28/2023   DTaP/Tdap/Td (3 -  Td or Tdap) 03/19/2023   Colonoscopy  04/14/2023   COVID-19 Vaccine (6 - 2024-25 season) 07/22/2023   Medicare Annual Wellness (AWV)  07/17/2024   MAMMOGRAM  11/05/2024   DEXA SCAN  Completed   Hepatitis C Screening  Completed   HPV VACCINES  Aged Out    Health Maintenance  Health Maintenance Due  Topic Date Due   Pneumonia Vaccine 65+ Years old (1 of 2 - PCV) Never done   Zoster Vaccines- Shingrix (1 of 2) Never done   INFLUENZA VACCINE  02/28/2023   DTaP/Tdap/Td (3 - Td or Tdap) 03/19/2023   Colonoscopy  04/14/2023   COVID-19 Vaccine (6 - 2024-25 season) 07/22/2023    Colorectal cancer screening: Type of screening: Colonoscopy. Completed 04/13/2022. Repeat every 1 years  Mammogram status: Completed 11/06/2022. Repeat every year  Bone  Density status: Completed 07/27/2021. Results reflect: Bone density results: NORMAL. Repeat every 5 years.  Lung Cancer Screening: (Low Dose CT Chest recommended if Age 4-80 years, 20 pack-year currently smoking OR have quit w/in 15years.) does not qualify.   Lung Cancer Screening Referral: no  Additional Screening:  Hepatitis C Screening: does qualify; Completed 12/24/2014  Vision Screening: Recommended annual ophthalmology exams for early detection of glaucoma and other disorders of the eye. Is the patient up to date with their annual eye exam?  Yes  Who is the provider or what is the name of the office in which the patient attends annual eye exams? [facility], [place]. If pt is not established with a provider, would they like to be referred to a provider to establish care? No .   Dental Screening: Recommended annual dental exams for proper oral hygiene  Community Resource Referral / Chronic Care Management: CRR required this visit?  No   CCM required this visit?  PCP informed of CCM need     Plan:     I have personally reviewed and noted the following in the patient's chart:   Medical and social history Use of alcohol, tobacco or illicit drugs  Current medications and supplements including opioid prescriptions. Patient is not currently taking opioid prescriptions. Functional ability and status Nutritional status Physical activity Advanced directives List of other physicians Hospitalizations, surgeries, and ER visits in previous 12 months Vitals Screenings to include cognitive, depression, and falls Referrals and appointments  In addition, I have reviewed and discussed with patient certain preventive protocols, quality metrics, and best practice recommendations. A written personalized care plan for preventive services as well as general preventive health recommendations were provided to patient.     Mickeal Needy, LPN   [id number]   After Visit Summary: (MyChart) Due  to this being a telephonic visit, the after visit summary with patients personalized plan was offered to patient via MyChart   Nurse Notes: CCM need. Patient is having severe depression and stress issues due to being homeless for the last 12 months. Patient is in need of CCM/Social Worker consult due to the following: housing difficulty, social/isolation issues, financial difficulties and food insecurity. Also, patient is overdue for colonoscopy with Dr. Rhea Belton.

## 2023-07-18 NOTE — Patient Instructions (Signed)
Megan Miller , Thank you for taking time to come for your Medicare Wellness Visit. I appreciate your ongoing commitment to your health goals. Please review the following plan we discussed and let me know if I can assist you in the future.   Referrals/Orders/Follow-Ups/Clinician Recommendations: CCM need. Patient is having severe depression and stress issues due to being homeless for the last 12 months. Patient is in need of CCM/Social Worker consult due to the following: housing difficulty, social/isolation issues, financial difficulties and food insecurity. Also, patient is overdue for colonoscopy with Dr. Rhea Belton.  This is a list of the screening recommended for you and due dates:  Health Maintenance  Topic Date Due   Pneumonia Vaccine (1 of 2 - PCV) Never done   Zoster (Shingles) Vaccine (1 of 2) Never done   Flu Shot  02/28/2023   DTaP/Tdap/Td vaccine (3 - Td or Tdap) 03/19/2023   Colon Cancer Screening  04/14/2023   COVID-19 Vaccine (6 - 2024-25 season) 07/22/2023   Medicare Annual Wellness Visit  07/17/2024   Mammogram  11/05/2024   DEXA scan (bone density measurement)  Completed   Hepatitis C Screening  Completed   HPV Vaccine  Aged Out    Advanced directives: (Declined) Advance directive discussed with you today. Even though you declined this today, please call our office should you change your mind, and we can give you the proper paperwork for you to fill out.  Next Medicare Annual Wellness Visit scheduled for next year: No; needs to make an appointment to see Dr. Laroy Apple in the office.

## 2023-07-19 ENCOUNTER — Other Ambulatory Visit: Payer: Self-pay | Admitting: Student

## 2023-07-19 ENCOUNTER — Other Ambulatory Visit: Payer: Self-pay | Admitting: Physician Assistant

## 2023-07-19 DIAGNOSIS — R21 Rash and other nonspecific skin eruption: Secondary | ICD-10-CM

## 2023-07-25 ENCOUNTER — Telehealth: Payer: Self-pay | Admitting: *Deleted

## 2023-07-25 NOTE — Progress Notes (Unsigned)
Complex Care Management Note Care Guide Note  07/25/2023 Name: Megan Miller MRN: [id number] DOB: 10-Jul-1956   Complex Care Management Outreach Attempts: An unsuccessful telephone outreach was attempted today to offer the patient information about available complex care management services.  Follow Up Plan:  Additional outreach attempts will be made to offer the patient complex care management information and services.   Encounter Outcome:  No Answer  Gwenevere Ghazi  Care Coordination Care Guide  Direct Dial: [phone]

## 2023-08-05 NOTE — Progress Notes (Signed)
 Complex Care Management Note  Care Guide Note 08/05/2023 Name: Megan Miller MRN: [id number] DOB: 06-12-1956  Megan Miller is a 68 y.o. year old female who sees Christia Budds, MD for primary care. I reached out to [employer] by phone today to offer complex care management services.  [name] was given information about Complex Care Management services today including:   The Complex Care Management services include support from the care team which includes your Nurse Coordinator, Clinical Social Worker, or Pharmacist.  The Complex Care Management team is here to help remove barriers to the health concerns and goals most important to you. Complex Care Management services are voluntary, and the patient may decline or stop services at any time by request to their care team member.   Complex Care Management Consent Status: Patient agreed to services and verbal consent obtained.   Follow up plan:  Telephone appointment with complex care management team member scheduled for:  08/06/23  Encounter Outcome:  Patient Scheduled  [facility] Coordination Care Guide  Direct Dial: [phone]

## 2023-08-05 NOTE — Progress Notes (Signed)
 Complex Care Management Note Care Guide Note  08/05/2023 Name: Megan Miller MRN: [id number] DOB: 1956-07-08   Complex Care Management Outreach Attempts: A second unsuccessful outreach was attempted today to offer the patient with information about available complex care management services.  Follow Up Plan:  Additional outreach attempts will be made to offer the patient complex care management information and services.   Encounter Outcome:  No Answer  Harlene Satterfield  Care Coordination Care Guide  Direct Dial: [phone]

## 2023-08-06 ENCOUNTER — Ambulatory Visit: Payer: Self-pay | Admitting: Licensed Clinical Social Worker

## 2023-08-06 NOTE — Patient Outreach (Signed)
  Care Coordination   Initial Visit Note   08/06/2023 Name: Megan Miller MRN: [id number] DOB: 03/19/1956  Megan Miller is a 68 y.o. year old female who sees Christia Budds, MD for primary care. I spoke with  Megan LITTIE Handler by phone today  What matters to the patients health and wellness today?   Pt V. Novosel and LCSW A. Azan Maneri completed initial visit via phone. Pt reports she is currently un-housed. Pt V. Dolson reports currently staying at a friends house and safe. Pt reports she is collaborating with Loss Adjuster, Chart[employer] and [employer] to secure housing. LCSW A. Ryne Mctigue encouraged pt to contact [employer], partners ending homelessness,  and higher ground for additional resources and support. LCSW A. Clement informed pt to urban ministry, [facility] [employer] [facility] for hot meals. Pt reports she is safe and does not require further assistance.    Goals Addressed             This Visit's Progress    COMPLETED: Care Coordination       Activities and task to complete in order to accomplish goals.   Pt will continue collaboration with case mgr with [facility] resource center. Pt reports having safe shelter at a friends house and will stay until she secures stable housing.  Pt will call [facility], urban ministry, and [place] housing authority for assistance with housing.  Pt will receive hot meals with urban ministry, interactive resource center and [employer] on Saturdays.         SDOH assessments and interventions completed:  Yes  SDOH Interventions Today    Flowsheet Row Most Recent Value  SDOH Interventions   Food Insecurity Interventions Community Resources Provided  [Urban ministry/[employer] on saturdays for a meal]  Housing Interventions Community Resources Provided  [employer] resource center/[facility]]        Care Coordination Interventions:  Yes, provided   Interventions Today    Flowsheet Row Most Recent Value  General Interventions   General Interventions Discussed/Reviewed [employer]  [Encourage pt to contact [facility] [place], urban [employer], higher ground and quarry manager for safe housing/shelter options.]  Education Interventions   Education Provided Provided Education  Provided Engineer, [employer] the bridge provides hot meals on saturdays, [employer] housing authority pt currently on wait list, [facility]]  Safety Interventions   Safety Discussed/Reviewed Safety Reviewed  [Pt reports she is safe temporary staying at friends house until she can find stable housing.]       Follow up plan: No further intervention required.   Encounter Outcome:  Patient Visit Completed

## 2023-08-06 NOTE — Patient Instructions (Signed)
 Visit Information  Thank you for taking time to visit with me today. Please don't hesitate to contact me if I can be of assistance to you.   Following are the goals we discussed today:   Goals Addressed             This Visit's Progress    COMPLETED: Care Coordination       Activities and task to complete in order to accomplish goals.   Pt will continue collaboration with case mgr with [facility] resource [facility]. Pt reports having safe shelter at a friends house and will stay until she secures stable housing.  Pt will call [facility], urban ministry, and  housing authority for assistance with housing.  Pt will receive hot meals with urban ministry, interactive resource center and [employer] foundation on Saturdays.         Please call the care guide team at [phone] if you need to cancel or reschedule your appointment.   If you are experiencing a Mental Health or Behavioral Health Crisis or need someone to talk to, please call 911   Patient verbalizes understanding of instructions and care plan provided today and agrees to view in MyChart. Active MyChart status and patient understanding of how to access instructions and care plan via MyChart confirmed with patient.     The patient has been provided with contact information for the care management team and has been advised to call with any health related questions or concerns.   Alfonso Rummer MSW, LCSW Licensed Clinical Social Worker  [facility], Population Health Direct Dial: [phone]  Fax: [phone]

## 2023-08-10 ENCOUNTER — Other Ambulatory Visit: Payer: Self-pay | Admitting: Student

## 2023-08-20 ENCOUNTER — Ambulatory Visit (INDEPENDENT_AMBULATORY_CARE_PROVIDER_SITE_OTHER): Payer: Medicare HMO | Admitting: Student

## 2023-08-20 VITALS — BP 126/80 | HR 82 | Temp 98.0°F | Wt 179.4 lb

## 2023-08-20 DIAGNOSIS — R058 Other specified cough: Secondary | ICD-10-CM | POA: Diagnosis not present

## 2023-08-20 DIAGNOSIS — L659 Nonscarring hair loss, unspecified: Secondary | ICD-10-CM | POA: Diagnosis not present

## 2023-08-20 MED ORDER — BENZONATATE 100 MG PO CAPS: 100.0000 mg | ORAL_CAPSULE | Freq: Three times a day (TID) | ORAL | 0 refills | Status: DC | PRN

## 2023-08-20 MED ORDER — FAMOTIDINE 20 MG PO TABS: 20.0000 mg | ORAL_TABLET | Freq: Every day | ORAL | 3 refills | Status: DC

## 2023-08-20 NOTE — Patient Instructions (Addendum)
Pleasure to meet you today.  I suspect your cough is most likely due to ongoing upper respiratory viral illness.  I have sent in prescription for [facility] which you will take for your cough.  I have also refilled your Pepcid to help with your GERD.  In terms of your hair loss/alopecia over the counter topical medication minoxidil.  Take months before you see significant results.  I will recommend following up in a month or 2 to discuss other additional options with your primary care doctor.

## 2023-08-20 NOTE — Progress Notes (Signed)
    SUBJECTIVE:   CHIEF COMPLAINT / HPI:   68 year old female with history of HTN, chronic migraine and GERD presenting today for recent illness.  Symptoms including running nose, coughing and sneezing started about 1-2 weeks.  Also reports irritation in her throat but denies any issues with swallowing. No fever or chills.  Normal bowel movement, no known sick contact or recent travel.  Hair loss And concerned about continued hair loss mostly in her crown area.  She said this has been going on for years. No increase in stress level .  Denies any cold intolerance, palpitations, unexplained weight loss or weight gain.   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  Exam underlying skin appears normal. Recommend topical minoxidil and could consider as tried in the future.  Patient agrees with topical minoxidil at this time and could discuss with PCP in future about adjuvant therapy with oral finasteride.   Viral URI with cough Suspect patient's cough is most likely due to ongoing viral illness.  Low threshold for malignancy given no history of tobacco use.  Given acute nature of symptom and reassuring vitals and exam will manage conservatively and treat with Tessalon Perles. -Rx Tessalon Perles -Recommend use of warm water honey and lemon -Return precautions discussed with patient who verbalized  understanding.    Jerre Simon, MD [facility]

## 2023-08-20 NOTE — Assessment & Plan Note (Signed)
Suspect this to be hyperadrenergic exam given bald pattern mostly in the crown area.  Exam underlying skin appears normal. Recommend topical minoxidil and could consider as tried in the future.  Patient agrees with topical minoxidil at this time and could discuss with PCP in future about adjuvant therapy with oral finasteride.

## 2023-08-23 ENCOUNTER — Telehealth: Payer: Self-pay

## 2023-08-23 NOTE — Telephone Encounter (Signed)
Patient Megan Miller on nurse line regarding prescription for her throat.   Per chart review, Dr. Elliot Gurney sent in rx for Tessalon capsules on 08/20/23. Attempted to call patient back to discuss concern further, however, she did not answer.   Megan Miller asking that she call our office back.   Veronda Prude, RN

## 2023-08-23 NOTE — Telephone Encounter (Signed)
Patient returns call to nurse line. Advised that provider sent in Tessalon and Famotidine on 08/20/23.   She is going to contact pharmacy regarding pick up.   Veronda Prude, RN

## 2023-09-03 ENCOUNTER — Telehealth: Payer: Self-pay

## 2023-09-03 ENCOUNTER — Telehealth: Payer: Self-pay | Admitting: Student

## 2023-09-03 NOTE — Telephone Encounter (Signed)
Left 2 forms in your box that the patient needs completed.

## 2023-09-03 NOTE — Telephone Encounter (Signed)
 Patient walked in with [employer] and is requesting a letter that will dismiss her from jury duty.  A copy of  the summons has been placed in the blue folder.   Patient has submitted a form for  DMV placard also placed in the blue folder. Last OV 08/20/23.

## 2023-09-05 NOTE — Telephone Encounter (Signed)
 DMV form placed up front (along with [employer].)   DMV copy made for batch scanning.   Attempted to call patient to advise the DMV form is ready HOWEVER she needs an apt for [employer] excuse. Patient did not answer and no VM box.  Please schedule her an apt to discuss Jury Duty excuse.

## 2023-09-13 ENCOUNTER — Other Ambulatory Visit: Payer: Self-pay | Admitting: Student

## 2023-09-13 DIAGNOSIS — R058 Other specified cough: Secondary | ICD-10-CM

## 2023-09-23 ENCOUNTER — Ambulatory Visit (INDEPENDENT_AMBULATORY_CARE_PROVIDER_SITE_OTHER): Payer: Medicare HMO | Admitting: Student

## 2023-09-23 ENCOUNTER — Encounter: Payer: Self-pay | Admitting: Student

## 2023-09-23 VITALS — BP 162/65 | HR 84 | Ht 61.0 in | Wt 180.4 lb

## 2023-09-23 DIAGNOSIS — M79604 Pain in right leg: Secondary | ICD-10-CM

## 2023-09-23 DIAGNOSIS — M79605 Pain in left leg: Secondary | ICD-10-CM

## 2023-09-23 DIAGNOSIS — R052 Subacute cough: Secondary | ICD-10-CM

## 2023-09-23 MED ORDER — FLUTICASONE PROPIONATE 50 MCG/ACT NA SUSP: 2.0000 | Freq: Every day | NASAL | 6 refills | Status: AC

## 2023-09-23 NOTE — Patient Instructions (Addendum)
 It was great to see you! Thank you for allowing me to participate in your care!   Our plans for today:  - I filled out your handicap placard -referred you to PT -Will get a chest xray today -sending flonase in - I cannot fill out your jury duty excuse as you do not have a disabling illness  Take care and seek immediate care sooner if you develop any concerns.  Levin Erp, MD

## 2023-09-23 NOTE — Assessment & Plan Note (Signed)
 Cough ongoing for 4 weeks.  No systemic symptoms.  Most consistent with postnasal drip. -Chest x-ray -Flonase -If continuing greater than 8 weeks recommend PFTs

## 2023-09-23 NOTE — Progress Notes (Signed)
    SUBJECTIVE:   CHIEF COMPLAINT / HPI: Legs aching, cough  Cough Month-long history of a worsening cough, which is particularly bothersome at night, She denies any associated fever, vomiting, or diarrhea.  She has no history of smoking or asthma.  She was previously prescribed medication for the cough, which did not provide relief (tessalon)  Chronic leg pain Bilateral cramping leg pain, which is worse with movement.  She has not been using compression therapy recently Has had extensive workup with vascular surgery (normal workup) and ultrasounds to rule out clots  Also requests jury duty exemption due to leg pain  PERTINENT  PMH / PSH: reviewed  OBJECTIVE:   BP (!) 162/74   Pulse 84   Ht 5\' 1"  (1.549 m)   Wt 180 lb 6 oz (81.8 kg)   SpO2 99%   BMI 34.08 kg/m   General: Well appearing, NAD, awake, alert, responsive to questions Head: Normocephalic atraumatic CV: Regular rate and rhythm no murmurs rubs or gallops Respiratory: chest rises symmetrically,  no increased work of breathing Extremities: Moves upper and lower extremities freely, no pain in LE or calves when palpated  ASSESSMENT/PLAN:   Assessment & Plan Subacute cough Cough ongoing for 4 weeks.  No systemic symptoms.  Most consistent with postnasal drip. -Chest x-ray -Flonase -If continuing greater than 8 weeks recommend PFTs Bilateral leg pain Chronic ongoing leg pain with overall negative workup. -Compression -PT referral for graded exercise   Levin Erp, MD [facility]

## 2023-10-14 ENCOUNTER — Ambulatory Visit
Admission: RE | Admit: 2023-10-14 | Discharge: 2023-10-14 | Disposition: A | Discharge status: Home / Self Care | Source: Ambulatory Visit | Attending: Family Medicine | Admitting: Family Medicine

## 2023-10-14 DIAGNOSIS — R052 Subacute cough: Secondary | ICD-10-CM

## 2023-10-15 ENCOUNTER — Encounter: Payer: Self-pay | Admitting: Student

## 2023-10-17 NOTE — Telephone Encounter (Signed)
 Patient LVM on nurse line requesting results of xray.   She reports she would also like a medication to help with her cough.   I attempted to call patient back to discuss, however no answer.   Will forward to PCP to address cough medication.

## 2023-10-17 NOTE — Telephone Encounter (Signed)
 Patient returns call to nurse line. Advised of negative X-ray results.   She reports continued cough. Per note from 2/24/2, if cough was persistent to 8 weeks, patient should receive PFT.   Scheduled patient for PFT tomorrow with Dr. Raymondo Band.   She is also asking about recommended cough medication or if something can be sent in to help with this. Tessalon perles have not worked for her in the past.   Will forward to PCP.   Veronda Prude, RN

## 2023-10-18 ENCOUNTER — Ambulatory Visit (INDEPENDENT_AMBULATORY_CARE_PROVIDER_SITE_OTHER): Admitting: Pharmacist

## 2023-10-18 ENCOUNTER — Encounter: Payer: Self-pay | Admitting: Pharmacist

## 2023-10-18 VITALS — BP 132/66 | HR 77 | Ht 61.0 in | Wt 177.0 lb

## 2023-10-18 DIAGNOSIS — R059 Cough, unspecified: Secondary | ICD-10-CM

## 2023-10-18 DIAGNOSIS — R058 Other specified cough: Secondary | ICD-10-CM

## 2023-10-18 NOTE — Telephone Encounter (Signed)
 Attempted to reach patient. No answer. LVM with instructions from provider. Aquilla Solian, CMA]

## 2023-10-18 NOTE — Patient Instructions (Signed)
 It was nice to see you today!  We will send your results to your PCP and she will contact you about next steps.   Medication Changes:  Continue all other medication the same.

## 2023-10-18 NOTE — Progress Notes (Signed)
    S:      Chief Complaint  Patient presents with   Medication Management    PFT   68 y.o. female who presents for diabetes evaluation, education, and management. Patient arrives in good spirits and presents without any assistance.   Patient was referred and last seen by Primary Care Provider, Dr. Laroy Apple on 09/23/2023.  PMH is significant for postnasal drip, CVA, HLD, HTN .  At last visit, patient had a cough that was thought to be consistent with postnasal drip. Patient is returning today for PFTs    Patient reports breathing has been difficult with cough the past couple of months. Reports coughing on and off throughout the night. Secondhand smoke exposure from a friend she lives with and a sister, but she hasn't seen here recently.   Medication adherence reported Reports adherence Current COPD medications: n/a Patient exacerbation hx: No hospitalizations but recent office visit for cough (end of February)   O: Review of Systems  Respiratory:  Positive for cough (ongoing at least the past month).   All other systems reviewed and are negative.   Physical Exam Neurological:     Mental Status: She is alert.     Vitals:   10/18/23 1122  BP: 132/66  Pulse: 77  SpO2: 100%    mMRC score>2 CAT score= 40 See Documentation Flowsheet - CAT/COPD for complete symptom scoring.  See "scanned report" or Documentation Flowsheet (discrete results - PFTs) for Spirometry results. Patient provided good effort while attempting spirometry.   Lung Age = 54 Albuterol Neb  Lot# [id number]     Exp. 11/2024   A/P: Patient has been experiencing cough for the past month and is taking benzontate and flonase. Spirometry evaluation with pre- and post-bronchodilator reveals decreased lung function. Spirometry evaluation reveals Moderate restrictive lung disease. Post nebulized albuterol tx revealed no change. .  -Reviewed results of pulmonary function tests.  Pt verbalized understanding of  results and education.   -Forwarding results to PCP for next steps  Written patient instructions provided.   Total time in face to face counseling 42 minutes.    Follow-up:  Patient seen with Threasa Heads, PharmD Candidate and Mack Guise, PharmD Candidate.

## 2023-10-18 NOTE — Assessment & Plan Note (Signed)
 Patient has been experiencing cough for the past month and is taking benzontate and flonase. Spirometry evaluation with pre- and post-bronchodilator reveals decreased lung function. Spirometry evaluation reveals Moderate restrictive lung disease. Post nebulized albuterol tx revealed no change. .  -Reviewed results of pulmonary function tests.  Pt verbalized understanding of results and education.   -Forwarding results to PCP for next steps

## 2023-10-21 NOTE — Progress Notes (Signed)
 Reviewed and agree with Dr Macky Lower plan.

## 2023-10-22 ENCOUNTER — Telehealth: Payer: Self-pay | Admitting: Student

## 2023-10-22 DIAGNOSIS — R053 Chronic cough: Secondary | ICD-10-CM

## 2023-10-22 NOTE — Telephone Encounter (Signed)
 Called patient and confirmed DOB Has been having ongoing cough for the past 8+ weeks.  Chest x-ray negative.  PFTs with restrictive pattern.  Not on any ACE inhibitors.  Is on famotidine.  I discussed case with Dr. McDiarmid who agreed with referral to pulmonology for further workup. Patient amenable to referral to pulmonology.

## 2023-10-23 ENCOUNTER — Ambulatory Visit (INDEPENDENT_AMBULATORY_CARE_PROVIDER_SITE_OTHER): Admitting: Student

## 2023-10-23 VITALS — BP 136/75 | HR 77 | Ht 61.0 in | Wt 179.2 lb

## 2023-10-23 DIAGNOSIS — R350 Frequency of micturition: Secondary | ICD-10-CM

## 2023-10-23 DIAGNOSIS — M549 Dorsalgia, unspecified: Secondary | ICD-10-CM

## 2023-10-23 LAB — POCT URINALYSIS DIP (MANUAL ENTRY)
Bilirubin, UA: NEGATIVE
Blood, UA: NEGATIVE
Glucose, UA: NEGATIVE mg/dL
Ketones, POC UA: NEGATIVE mg/dL
Leukocytes, UA: NEGATIVE
Nitrite, UA: NEGATIVE
Spec Grav, UA: 1.025 (ref 1.010–1.025)
Urobilinogen, UA: 1 U/dL
pH, UA: 5.5 (ref 5.0–8.0)

## 2023-10-23 MED ORDER — PHENAZOPYRIDINE HCL 95 MG PO TABS: 95.0000 mg | ORAL_TABLET | Freq: Three times a day (TID) | ORAL | 0 refills | Status: DC | PRN

## 2023-10-23 NOTE — Progress Notes (Signed)
    SUBJECTIVE:   CHIEF COMPLAINT / HPI:   Increased Urinary Frequency & Lower Back Pain:  The patient presents with a two-day history of urinary frequency and incontinence. She reports a sudden urge to urinate. She describes the sensation during urination as 'feeling funny,' but denies dysuria and hematuria. She has no known history of urinary tract infections or kidney problems.  In addition to the urinary symptoms, the patient has been experiencing chronic back pain for several months. The pain is located in the lower back and sometimes radiates down the legs, more so on one side. She also reports occasional difficulty in bending over due to the pain.  The patient denies any recent changes to her medication regimen, any known history of diabetes, and any recent changes in bowel habits. She also denies any vaginal discharge or risk of sexually transmitted diseases.  PERTINENT  PMH / PSH: Reviewed   OBJECTIVE:   BP 136/75   Pulse 77   Ht 5\' 1"  (1.549 m)   Wt 179 lb 3.2 oz (81.3 kg)   SpO2 98%   BMI 33.86 kg/m   CHEST: Clear to auscultation bilaterally, no wheezes, rhonchi, or crackles. CARDIOVASCULAR: Normal heart rate and rhythm, S1 and S2 normal without murmurs. ABDOMEN: Non-tender. Nondistended, soft, no CVA tenderness  MUSCULOSKELETAL: Back normal. Normal Flex and ext with nml side to side  LE strength nml  Nml ambulation   ASSESSMENT/PLAN:   Assessment & Plan Urinary frequency Urgency and occasional leakage without signs of infection. Bladder irritation suspected. UA negative. CMP & A1C ordered.  - Prescribed Azo for bladder irritation. - Advised avoidance of bladder irritants such as caffeine, citrus, and carbonated beverages. Back pain, unspecified back location, unspecified back pain laterality, unspecified chronicity Chronic paraspinal pain without significant lower extremity involvement. Physical therapy recommended as initial treatment. XR lspine 2023 without acute  concerns - Referred to physical therapy. - Consider imaging if no improvement with physical therapy.      Alfredo Martinez, MD [facility]

## 2023-10-23 NOTE — Telephone Encounter (Signed)
error 

## 2023-10-23 NOTE — Patient Instructions (Addendum)
 It was great to see you today! Thank you for choosing [facility] for your primary care.  Today we addressed: Pyridium up to three times a day as needed  Physical therapy with call you  Return in 4 weeks if not improved  Foods that have been known to amplify overactive bladder symptoms include: Alcohol Caffeinated beverages Carbonated beverages Citrus foods High water-content foods, like watermelon, cucumbers and strawberries Onions Salsa Spicy food Tomatoes  If you haven't already, sign up for My Chart to have easy access to your labs results, and communication with your primary care physician.   Please arrive 15 minutes before your appointment to ensure smooth check in process.  We appreciate your efforts in making this happen.  Thank you for allowing me to participate in your care, Alfredo Martinez, MD 10/23/2023, 11:22 AM PGY-3, [facility]

## 2023-10-24 ENCOUNTER — Encounter: Payer: Self-pay | Admitting: Student

## 2023-10-24 LAB — COMPREHENSIVE METABOLIC PANEL WITH GFR
ALT: 9 IU/L (ref 0–32)
AST: 20 IU/L (ref 0–40)
Albumin: 4.2 g/dL (ref 3.9–4.9)
Alkaline Phosphatase: 111 IU/L (ref 44–121)
BUN/Creatinine Ratio: 15 (ref 12–28)
BUN: 12 mg/dL (ref 8–27)
Bilirubin Total: 0.9 mg/dL (ref 0.0–1.2)
CO2: 23 mmol/L (ref 20–29)
Calcium: 9.3 mg/dL (ref 8.7–10.3)
Chloride: 103 mmol/L (ref 96–106)
Creatinine, Ser: 0.78 mg/dL (ref 0.57–1.00)
Globulin, Total: 3.4 g/dL (ref 1.5–4.5)
Glucose: 75 mg/dL (ref 70–99)
Potassium: 4.4 mmol/L (ref 3.5–5.2)
Sodium: 142 mmol/L (ref 134–144)
Total Protein: 7.6 g/dL (ref 6.0–8.5)
eGFR: 83 mL/min/{1.73_m2} (ref >59)

## 2023-10-29 ENCOUNTER — Encounter: Payer: Self-pay | Admitting: Student

## 2023-11-05 ENCOUNTER — Encounter: Payer: Self-pay | Admitting: Student

## 2023-11-05 ENCOUNTER — Ambulatory Visit: Admitting: Student

## 2023-11-05 VITALS — BP 127/80 | HR 87 | Ht 61.0 in | Wt 180.8 lb

## 2023-11-05 DIAGNOSIS — M79605 Pain in left leg: Secondary | ICD-10-CM

## 2023-11-05 DIAGNOSIS — M79604 Pain in right leg: Secondary | ICD-10-CM | POA: Diagnosis not present

## 2023-11-05 MED ORDER — DICLOFENAC SODIUM 1 % EX GEL
4.0000 g | Freq: Four times a day (QID) | CUTANEOUS | 0 refills | Status: AC
Start: 2023-11-05 — End: ?

## 2023-11-05 NOTE — Progress Notes (Signed)
    SUBJECTIVE:   CHIEF COMPLAINT / HPI: Megan Miller duty excuse  Discussed the use of AI scribe software for clinical note transcription with the patient, who gave verbal consent to proceed.  History of Present Illness The patient presents requesting a medical exemption from jury duty. She reports difficulty walking due to leg pain and a burning sensation in her back. She does not have a disabling condition that would preclude her from jury duty. She has previously been excused from jury duty by another physician many years ago.  States that she has a history of stroke, HTN, arthritis and homelessness. No hx of memory deficits from stroke. Walks without assistive devices.  The patient's leg pain is localized and has been previously managed with Voltaren gel, which she reports as effective. She requests a refill of this medication.   PERTINENT  PMH / PSH: CVA, htn  OBJECTIVE:   BP 127/80   Pulse 87   Ht 5\' 1"  (1.549 m)   Wt 180 lb 12.8 oz (82 kg)   SpO2 97%   BMI 34.16 kg/m   General: Well appearing, NAD, awake, alert, responsive to questions Head: Normocephalic atraumatic Respiratory: chest rises symmetrically,  no increased work of breathing Extremities: Moves upper and lower extremities freely, no edema in LE, mild tenderness bilaterally on calves  ASSESSMENT/PLAN:   Assessment & Plan Pain in both lower extremities Experiencing burning pain. Has had extensive workup in past ruling out vascular issues. - Prescribe Voltaren gel for topical application.   Jury Duty Consideration No disabling condition for jury duty exemption which was discussed with patient.  - Provide a note listing medical conditions for court consideration.  Levin Erp, MD [facility]

## 2023-11-05 NOTE — Patient Instructions (Signed)
 It was great to see you! Thank you for allowing me to participate in your care!   Our plans for today:  - I have written a note about your medical conditions but have left it up to courts for determination   Take care and seek immediate care sooner if you develop any concerns.  Levin Erp, MD

## 2023-11-15 ENCOUNTER — Telehealth: Payer: Self-pay | Admitting: Family Medicine

## 2023-11-15 NOTE — Telephone Encounter (Signed)
**  After Hours/ Emergency Line Call**  Received a page to call [phone]) - [phone].  Patient: Megan Miller  Caller: Self  Confirmed name & DOB of patient with caller  Subjective:  Patient called regarding globus sensation ongoing for months.  No difficulty breathing, no vomiting sensation does pass the room in its after eating.  She is taking her PPI.  Objective:  Observations: NAD really talking over the phone.   Assessment & Plan  Renna Kilmer Lohr is a 68 y.o. female with PMHx s/f GERD who calls with the following complaints and concerns:   Globus sensation    Recommendations:  Recommended chewing her food very thoroughly eating slowly with small bites. Continue her PPI. Scheduled with Dr. Leocadia Rains Tuesday.  -- Red flags discussed.   -- Will forward to PCP.  Ivin Marrow, MD, PGY-2 [facility] 9:49 AM 11/15/2023

## 2023-11-16 ENCOUNTER — Other Ambulatory Visit: Payer: Self-pay | Admitting: Student

## 2023-11-17 ENCOUNTER — Other Ambulatory Visit: Payer: Self-pay | Admitting: Student

## 2023-11-17 DIAGNOSIS — R21 Rash and other nonspecific skin eruption: Secondary | ICD-10-CM

## 2023-11-19 ENCOUNTER — Other Ambulatory Visit: Payer: Self-pay | Admitting: Family Medicine

## 2023-11-19 ENCOUNTER — Ambulatory Visit: Payer: Self-pay | Admitting: Student

## 2023-11-19 DIAGNOSIS — Z1231 Encounter for screening mammogram for malignant neoplasm of breast: Secondary | ICD-10-CM

## 2023-11-22 ENCOUNTER — Other Ambulatory Visit: Payer: Self-pay | Admitting: Student

## 2023-11-22 DIAGNOSIS — R21 Rash and other nonspecific skin eruption: Secondary | ICD-10-CM

## 2023-11-25 ENCOUNTER — Ambulatory Visit: Payer: Self-pay | Admitting: Student

## 2023-12-02 ENCOUNTER — Emergency Department (HOSPITAL_COMMUNITY)
Admission: EM | Admit: 2023-12-02 | Discharge: 2023-12-02 | Disposition: A | Discharge status: Home / Self Care | Attending: Emergency Medicine | Admitting: Emergency Medicine

## 2023-12-02 ENCOUNTER — Encounter (HOSPITAL_COMMUNITY): Payer: Self-pay | Admitting: Emergency Medicine

## 2023-12-02 ENCOUNTER — Emergency Department (HOSPITAL_COMMUNITY)

## 2023-12-02 ENCOUNTER — Ambulatory Visit

## 2023-12-02 ENCOUNTER — Other Ambulatory Visit: Payer: Self-pay

## 2023-12-02 DIAGNOSIS — Z7982 Long term (current) use of aspirin: Secondary | ICD-10-CM | POA: Diagnosis not present

## 2023-12-02 DIAGNOSIS — I10 Essential (primary) hypertension: Secondary | ICD-10-CM | POA: Diagnosis not present

## 2023-12-02 DIAGNOSIS — J029 Acute pharyngitis, unspecified: Secondary | ICD-10-CM | POA: Diagnosis present

## 2023-12-02 DIAGNOSIS — Z79899 Other long term (current) drug therapy: Secondary | ICD-10-CM | POA: Diagnosis not present

## 2023-12-02 LAB — CBC
HCT: 39.5 % (ref 36.0–46.0)
Hemoglobin: 12.5 g/dL (ref 12.0–15.0)
MCH: 28.4 pg (ref 26.0–34.0)
MCHC: 31.6 g/dL (ref 30.0–36.0)
MCV: 89.8 fL (ref 80.0–100.0)
Platelets: 265 10*3/uL (ref 150–400)
RBC: 4.4 MIL/uL (ref 3.87–5.11)
RDW: 13.2 % (ref 11.5–15.5)
WBC: 7.5 10*3/uL (ref 4.0–10.5)
nRBC: 0 % (ref 0.0–0.2)

## 2023-12-02 LAB — RESP PANEL BY RT-PCR (RSV, FLU A&B, COVID)  RVPGX2
Influenza A by PCR: NEGATIVE
Influenza B by PCR: NEGATIVE
Resp Syncytial Virus by PCR: NEGATIVE
SARS Coronavirus 2 by RT PCR: NEGATIVE

## 2023-12-02 LAB — BASIC METABOLIC PANEL WITH GFR
Anion gap: 12 (ref 5–15)
BUN: 11 mg/dL (ref 8–23)
CO2: 23 mmol/L (ref 22–32)
Calcium: 9.3 mg/dL (ref 8.9–10.3)
Chloride: 107 mmol/L (ref 98–111)
Creatinine, Ser: 0.75 mg/dL (ref 0.44–1.00)
GFR, Estimated: >60 mL/min (ref >60)
Glucose, Bld: 77 mg/dL (ref 70–99)
Potassium: 3.6 mmol/L (ref 3.5–5.1)
Sodium: 142 mmol/L (ref 135–145)

## 2023-12-02 LAB — GROUP A STREP BY PCR: Group A Strep by PCR: NOT DETECTED

## 2023-12-02 MED ORDER — IOHEXOL 350 MG/ML SOLN
75.0000 mL | Freq: Once | INTRAVENOUS | Status: AC | PRN
  Administered 2023-12-02: 75 mL via INTRAVENOUS

## 2023-12-02 NOTE — ED Provider Notes (Signed)
 Patient care signed out to follow-up CT soft tissue results for recurrent sore throat worse with swallowing.  CT scan results independent reviewed no signs of abscess or masses.  Patient stable for discharge and outpatient follow-up with primary doctor.  Filiberto Hug, MD 12/02/23 [phone]

## 2023-12-02 NOTE — ED Provider Notes (Signed)
 [place] EMERGENCY DEPARTMENT AT [facility] Provider Note   CSN: [id number] Arrival date & time: 12/02/23  1149     History  Chief Complaint  Patient presents with   Sore Throat    Megan Miller is a 68 y.o. female.   Sore Throat  Patient with sore throat.  Worse with swallowing.  More on the left side.  Is had for around 2 months.  No weight loss.  No fevers.  No known sick contacts.  No difficulty breathing.  No weight loss.  Patient is not a smoker.    Past Medical History:  Diagnosis Date   Allergic conjunctivitis    Arthritis    Chest pain    Chronic back pain    Constipation, chronic    Dyspnea on exertion    Dysthymic    GERD (gastroesophageal reflux disease)    Hyperlipidemia    Hypertension    Leg edema    Asymetric Right leg (dopplers negative)   Overweight (BMI 25.0-29.9)    Stroke [facility]) 2019   Urination frequency     Home Medications Prior to Admission medications   Medication Sig Start Date End Date Taking? Authorizing Provider  acetaminophen  (TYLENOL  8 HOUR) 650 MG CR tablet Take 1 tablet (650 mg total) by mouth every 6 (six) hours as needed for pain. 02/24/21   Brimage, Vondra, DO  aspirin  81 MG tablet Take 1 tablet (81 mg total) by mouth daily. 09/23/14   Kent Pear, MD  atorvastatin  (LIPITOR) 40 MG tablet TAKE 1 TABLET BY MOUTH EVERY DAY 08/01/22   Dameron, Marisa, DO  benzonatate  (TESSALON  PERLES) 100 MG capsule Take 1 capsule (100 mg total) by mouth 3 (three) times daily as needed for cough. 08/20/23   Goble Last, MD  Blood Pressure Monitoring (ADULT BLOOD PRESSURE CUFF LG) KIT 1 kit by Does not apply route 2 (two) times daily. 05/14/22   Genora Kidd, MD  carvedilol  (COREG ) 3.125 MG tablet TAKE 1 TABLET BY MOUTH TWICE A DAY WITH FOOD 08/12/23   Genora Kidd, MD  diclofenac  Sodium (VOLTAREN ) 1 % GEL Apply 4 g topically 4 (four) times daily. 11/05/23   Genora Kidd, MD  Emollient (EUCERIN) lotion Apply topically as  needed for dry skin.    [provider]  famotidine  (PEPCID ) 20 MG tablet TAKE 1 TABLET BY MOUTH EVERY DAY 11/18/23   Genora Kidd, MD  fluticasone  (FLONASE ) 50 MCG/ACT nasal spray Place 2 sprays into both nostrils daily. Patient not taking: Reported on 10/18/2023 09/23/23   Genora Kidd, MD  gabapentin  (NEURONTIN ) 100 MG capsule Take 2 capsules (200 mg total) by mouth at bedtime. Patient not taking: Reported on 10/18/2023 09/03/22   Jagadish, Mayuri, MD  ipratropium (ATROVENT ) 0.06 % nasal spray Place 2 sprays into both nostrils 4 (four) times daily. Patient not taking: Reported on 10/18/2023 09/26/16 02/27/25  Haney, Alyssa A, MD  olopatadine (PATANOL) 0.1 % ophthalmic solution Place 1 drop into both eyes 2 (two) times daily as needed (seasonal allergies/itching).     [provider]  phenazopyridine  (PYRIDIUM ) 95 MG tablet Take 1 tablet (95 mg total) by mouth 3 (three) times daily as needed for pain. 10/23/23   Ernestina Headland, MD  polyethylene glycol powder (GLYCOLAX /MIRALAX ) 17 GM/SCOOP powder Dissolve 17grams in at at least 32 ounces of water or juice daily as needed. 09/21/21   Brimage, Vondra, DO  RABEprazole  (ACIPHEX ) 20 MG tablet TAKE 1 TABLET (20 MG TOTAL) BY MOUTH IN THE MORNING  AND AT BEDTIME 09/11/22   Genora Kidd, MD  senna (SENOKOT) 8.6 MG TABS tablet Take 2 tablets (17.2 mg total) by mouth daily as needed for mild constipation. 09/21/21   Brimage, Vondra, DO  Skin Protectants, Misc. (EUCERIN) cream Apply topically as needed for dry skin.    [provider]  Soap & Cleansers (NEUTROGENA FACIAL SOAP) BAR Apply 1 each topically in the morning and at bedtime. 11/20/21   Brimage, Vondra, DO  triamcinolone  ointment (KENALOG ) 0.1 % APPLY TO AFFECTED AREA TWICE A DAY 11/18/23   Genora Kidd, MD  XIIDRA 5 % SOLN Apply 1 drop to eye 2 (two) times daily. 01/13/22   [provider]      Allergies    Percocet [oxycodone -acetaminophen ] and Codeine phosphate     Review of Systems   Review of Systems  Physical Exam Updated Vital Signs BP (!) 178/83   Pulse 88   Temp 99.1 F (37.3 C)   Resp 18   SpO2 98%  Physical Exam HENT:     Head: Atraumatic.     Mouth/Throat:     Mouth: Mucous membranes are moist.     Pharynx: Uvula midline. No oropharyngeal exudate or posterior oropharyngeal erythema.  Neurological:     Mental Status: She is alert.   Somewhat difficult to get good posterior pharyngeal evaluation.  No exudates seen.  No swelling seen.  ED Results / Procedures / Treatments   Labs (all labs ordered are listed, but only abnormal results are displayed) Labs Reviewed  GROUP A STREP BY PCR  RESP PANEL BY RT-PCR (RSV, FLU A&B, COVID)  [id number]    EKG None  Radiology No results found.  Procedures Procedures    Medications Ordered in ED Medications - No data to display  ED Course/ Medical Decision Making/ A&P                                 Medical Decision Making Amount and/or Complexity of Data Reviewed Labs: ordered. Radiology: ordered.   Patient with sore throat.  Sick for 2 months.  Worse on the left side.  Non-smoker.  Difficult physical exam.  No palpable nodes felt.  With 2 months of symptoms and worsening swallowing will get CT scan to further evaluate causes such as mass or deeper infection.  Care turned over to Dr. Salomon Cree.        Final Clinical Impression(s) / ED Diagnoses Final diagnoses:  None    Rx / DC Orders ED Discharge Orders     None         Mozell Arias, MD 12/02/23 [phone]

## 2023-12-02 NOTE — ED Triage Notes (Signed)
 Pt here from home with c/o sore throat , hurts more on the left side

## 2023-12-02 NOTE — Discharge Instructions (Signed)
 Your CT scan did not show any signs of mass, abscess or reason for your discomfort. Follow-up closely with your primary doctor in the next week. Use Tylenol  every 4 hours as needed for pain.

## 2023-12-03 ENCOUNTER — Telehealth: Payer: Self-pay

## 2023-12-03 NOTE — Telephone Encounter (Signed)
 Patient LVM on nurse line requesting (2) medication refills.   I could not make out which medications she is needing refilled.   I called her back for clarification, however no answer.   Will await her return call.

## 2023-12-06 ENCOUNTER — Encounter (HOSPITAL_COMMUNITY): Payer: Self-pay

## 2023-12-13 ENCOUNTER — Ambulatory Visit
Admission: RE | Admit: 2023-12-13 | Discharge: 2023-12-13 | Disposition: A | Discharge status: Home / Self Care | Source: Ambulatory Visit | Attending: Family Medicine | Admitting: Family Medicine

## 2023-12-13 DIAGNOSIS — Z1231 Encounter for screening mammogram for malignant neoplasm of breast: Secondary | ICD-10-CM

## 2023-12-18 ENCOUNTER — Ambulatory Visit (INDEPENDENT_AMBULATORY_CARE_PROVIDER_SITE_OTHER): Admitting: Student

## 2023-12-18 ENCOUNTER — Encounter: Payer: Self-pay | Admitting: Student

## 2023-12-18 VITALS — BP 149/75 | HR 88 | Ht 61.0 in | Wt 179.8 lb

## 2023-12-18 DIAGNOSIS — K219 Gastro-esophageal reflux disease without esophagitis: Secondary | ICD-10-CM | POA: Diagnosis not present

## 2023-12-18 DIAGNOSIS — R09A2 Foreign body sensation, throat: Secondary | ICD-10-CM

## 2023-12-18 MED ORDER — RABEPRAZOLE SODIUM 20 MG PO TBEC: 20.0000 mg | DELAYED_RELEASE_TABLET | Freq: Every day | ORAL | 1 refills | Status: AC

## 2023-12-18 NOTE — Assessment & Plan Note (Signed)
 Suspect related to GERD and nonadherence to Aciphex .  Test for H. pylori.  Will refill PPI.  Reviewed prior globus sensation visits.  She has been evaluated by endoscopy approximately 3 years ago.  Should this not improve, recommend evaluation by ENT for consideration of vocal cord dysfunction.

## 2023-12-18 NOTE — Patient Instructions (Signed)
 It was great to see you today! Thank you for choosing [facility] for your primary care.  Today we addressed: Refill Aciphex , H. pylori testing.  Please return in a couple weeks so we can do a full medication review. Should your symptoms not improve, it may be good to consider increasing the medication or having you be seen by ENT for vocal cord analysis.  If you haven't already, sign up for My Chart to have easy access to your labs results, and communication with your primary care physician.  Return in about 2 weeks (around 01/01/2024) for Med rec with PCP. Please arrive 15 minutes before your appointment to ensure smooth check in process.  We appreciate your efforts in making this happen.  Thank you for allowing me to participate in your care, [name], DO 12/18/2023, 11:40 AM PGY-3, [facility]

## 2023-12-18 NOTE — Progress Notes (Signed)
  SUBJECTIVE:   CHIEF COMPLAINT / HPI:   Megan Miller is a 68 year old female who presents with a sensation of something stuck in her throat.  She has experienced this sensation for about a year, along with odynophagia, coughing, and nocturnal wheezing. A similar sensation occurred in 2022, which resolved temporarily before returning. She underwent a CT scan during a hospital visit a week or two ago, but the results are unknown.  She has gastroesophageal reflux disease and was previously taking a medication she refers to as 'Aciphex ', which she has been off for a couple of months. This medication helped alleviate her symptoms. Currently, she is taking famotidine  (Pepcid ) for her reflux. She denies current symptoms of reflux such as burning in the throat, but mentions occasional nocturnal occurrences.  She denies nausea, vomiting, or diarrhea. She also experiences leg aches and wants to address this issue separately.  PERTINENT  PMH / PSH: HTN, GERD, CVA, HLD  OBJECTIVE:  BP (!) 149/75   Pulse 88   Ht 5\' 1"  (1.549 m)   Wt 179 lb 12.8 oz (81.6 kg)   SpO2 100%   BMI 33.97 kg/m  General: Well-appearing, NAD HEENT: Poorly able to appreciate oropharynx, no lymphadenopathy nor thyroid  nodularity  ASSESSMENT/PLAN:   Assessment & Plan Globus sensation Suspect related to GERD and nonadherence to Aciphex .  Test for H. pylori.  Will refill PPI.  Reviewed prior globus sensation visits.  She has been evaluated by endoscopy approximately 3 years ago.  Should this not improve, recommend evaluation by ENT for consideration of vocal cord dysfunction. Return in about 2 weeks (around 01/01/2024) for Med rec with PCP. [name], DO 12/18/2023, 4:20 PM PGY-3, [facility]

## 2023-12-20 ENCOUNTER — Ambulatory Visit: Payer: Self-pay | Admitting: Student

## 2023-12-20 LAB — H. PYLORI BREATH TEST: H pylori Breath Test: NEGATIVE

## 2024-01-07 ENCOUNTER — Encounter: Payer: Self-pay | Admitting: *Deleted

## 2024-01-13 NOTE — Progress Notes (Signed)
 SUBJECTIVE:   Chief compliant/HPI: annual examination  Vaudine L Krawczyk is a 68 y.o. who presents today for an annual exam.   Discussed the use of AI scribe software for clinical note transcription with the patient, who gave verbal consent to proceed.  History of Present Illness Alaira L Funderburke is a 68 year old female who presents for an annual physical exam and reports chronic right leg pain.  She experiences chronic burning pain in both legs, with the right leg more affected. The pain is intermittent, occurring during walking and at rest, and does not improve with rest. Gabapentin  was previously tried at a low dose but was ineffective.  Her blood pressure has been high when checked at a community location. She takes carvedilol  3.125 twice daily and has taken it today. She does not regularly monitor her blood pressure at home.  She has no history of fractures, night sweats, coughing at night, or recent travel to new countries. She has never smoked and does not currently drink alcohol. She has not eaten this morning. No pain or swelling upon pressing the affected area of her leg.  History tabs reviewed and updated   Review of systems form reviewed  OBJECTIVE:   BP (!) 136/57   Pulse 80   Ht 5' 1 (1.549 m)   Wt 178 lb 2 oz (80.8 kg)   SpO2 99%   BMI 33.66 kg/m   General: Well appearing, NAD, awake, alert, responsive to questions Head: Normocephalic atraumatic, no neck masses CV: Regular rate and rhythm no murmurs rubs or gallops Respiratory: Clear to ausculation bilaterally, no wheezes rales or crackles, chest rises symmetrically,  no increased work of breathing Abdomen: Soft, non-tender, non-distended, normoactive bowel sounds  Extremities: Moves upper and lower extremities freely, no edema in LE, no tenderness to palpation along calves or warmth/swelling Neuro: No focal deficits Skin: No rashes or lesions visualized  ASSESSMENT/PLAN:   Assessment &  Plan HYPERTENSION, BENIGN Patient's blood pressure is controlled today. BP: (!) 136/57. Goal of 140/90. Patient's medication regimen includes Coreg  3.125 BID. -Changes to current regimen include none -Monitor at followup Pure hypercholesterolemia -Lipid panel Screening for STD (sexually transmitted disease) -HIV, RPR, hep C Leg cramping Has had extensive workup for this, does appear to be more likely nerve related given pins-and-needles sensation. - Gabapentin  300 mg nightly, 1 month follow-up  Annual Examination  See AVS for age appropriate recommendations  PHQ score     01/14/2024   10:16 AM 12/18/2023   11:05 AM 11/05/2023    2:46 PM 10/23/2023   10:57 AM 08/20/2023   11:00 AM  Depression screen PHQ 2/9  Decreased Interest -- 1 3 1 1   Down, Depressed, Hopeless -- 0 1 1 1   PHQ - 2 Score  1 4 2 2   Altered sleeping 0 1 1 1  0  Tired, decreased energy 0 1 1 1 1   Change in appetite  1 1 1 1   Feeling bad or failure about yourself  0 0 0 0 0  Trouble concentrating 0 0 0 0 1  Moving slowly or fidgety/restless 0 1 0 1 1  Suicidal thoughts 0 0 0 0 0  PHQ-9 Score  5 7 6 6    , reviewed and discussed.  BP reviewed and at goal .   Considered the following items based upon USPSTF recommendations: Diabetes screening: ordered Screening for elevated cholesterol: ordered HIV testing: ordered Hepatitis C: ordered Hepatitis B: discussed Syphilis if at high risk: ordered  GC/CT not at high risk and not ordered. Osteoporosis screening considered based upon risk of fracture from [facility] calculator. DEXA not ordered.  Reviewed risk factors for latent tuberculosis and not indicated  Cervical cancer screening: not indicated given history of hysterectomy Breast cancer screening: utd Colorectal cancer screening: up to date on screening for CRC.-states that she got one 2 months ago and is requesting [facility] to send records over Lung cancer screening: discussed, never smoker See documentation below  regarding indications/risks/benefits.   Follow up in 1  year or sooner if indicated.  MyChart Activation: Already signed up  Genora Kidd, MD [facility]

## 2024-01-14 ENCOUNTER — Ambulatory Visit: Payer: Self-pay | Admitting: Student

## 2024-01-14 ENCOUNTER — Ambulatory Visit (INDEPENDENT_AMBULATORY_CARE_PROVIDER_SITE_OTHER): Admitting: Student

## 2024-01-14 ENCOUNTER — Encounter: Payer: Self-pay | Admitting: Student

## 2024-01-14 VITALS — BP 136/57 | HR 80 | Ht 61.0 in | Wt 178.1 lb

## 2024-01-14 DIAGNOSIS — R252 Cramp and spasm: Secondary | ICD-10-CM

## 2024-01-14 DIAGNOSIS — Z113 Encounter for screening for infections with a predominantly sexual mode of transmission: Secondary | ICD-10-CM

## 2024-01-14 DIAGNOSIS — I10 Essential (primary) hypertension: Secondary | ICD-10-CM

## 2024-01-14 DIAGNOSIS — E7800 Pure hypercholesterolemia, unspecified: Secondary | ICD-10-CM

## 2024-01-14 LAB — POCT GLYCOSYLATED HEMOGLOBIN (HGB A1C): Hemoglobin A1C: 5.5 % (ref 4.0–5.6)

## 2024-01-14 MED ORDER — GABAPENTIN 300 MG PO CAPS: 300.0000 mg | ORAL_CAPSULE | Freq: Every day | ORAL | 0 refills | Status: DC

## 2024-01-14 NOTE — Patient Instructions (Addendum)
 It was great to see you! Thank you for allowing me to participate in your care!   Our plans for today:  - We will check some labs today for annual - I am increasing her gabapentin  to 300 mg at nighttime to help with leg pain, please come back for 1 month follow-up - Your blood pressure was controlled on recheck  Take care and seek immediate care sooner if you develop any concerns.  Genora Kidd, MD

## 2024-01-14 NOTE — Assessment & Plan Note (Signed)
 Has had extensive workup for this, does appear to be more likely nerve related given pins-and-needles sensation. - Gabapentin  300 mg nightly, 1 month follow-up

## 2024-01-14 NOTE — Assessment & Plan Note (Signed)
 Lipid panel

## 2024-01-14 NOTE — Assessment & Plan Note (Signed)
 Patient's blood pressure is controlled today. BP: (!) 136/57. Goal of 140/90. Patient's medication regimen includes Coreg  3.125 BID. -Changes to current regimen include none -Monitor at followup

## 2024-01-15 LAB — LIPID PANEL
Chol/HDL Ratio: 1.8 ratio (ref 0.0–4.4)
Cholesterol, Total: 108 mg/dL (ref 100–199)
HDL: 59 mg/dL (ref >39)
LDL Chol Calc (NIH): 35 mg/dL (ref 0–99)
Triglycerides: 64 mg/dL (ref 0–149)
VLDL Cholesterol Cal: 14 mg/dL (ref 5–40)

## 2024-01-15 LAB — RPR: RPR Ser Ql: NONREACTIVE

## 2024-01-15 LAB — HIV ANTIBODY (ROUTINE TESTING W REFLEX): HIV Screen 4th Generation wRfx: NONREACTIVE

## 2024-01-15 LAB — HEPATITIS C ANTIBODY: Hep C Virus Ab: NONREACTIVE

## 2024-01-30 ENCOUNTER — Ambulatory Visit: Admitting: Student

## 2024-02-06 ENCOUNTER — Other Ambulatory Visit: Payer: Self-pay

## 2024-02-06 MED ORDER — CARVEDILOL 3.125 MG PO TABS: 3.1250 mg | ORAL_TABLET | Freq: Two times a day (BID) | ORAL | 1 refills | Status: DC

## 2024-02-13 ENCOUNTER — Other Ambulatory Visit: Payer: Self-pay

## 2024-02-13 DIAGNOSIS — R252 Cramp and spasm: Secondary | ICD-10-CM

## 2024-02-13 MED ORDER — GABAPENTIN 300 MG PO CAPS: 300.0000 mg | ORAL_CAPSULE | Freq: Every day | ORAL | 0 refills | Status: DC

## 2024-03-23 ENCOUNTER — Other Ambulatory Visit: Payer: Self-pay | Admitting: Student

## 2024-03-23 DIAGNOSIS — R252 Cramp and spasm: Secondary | ICD-10-CM

## 2024-05-02 ENCOUNTER — Other Ambulatory Visit: Payer: Self-pay | Admitting: Family Medicine

## 2024-05-04 ENCOUNTER — Other Ambulatory Visit: Payer: Self-pay

## 2024-05-04 DIAGNOSIS — R21 Rash and other nonspecific skin eruption: Secondary | ICD-10-CM

## 2024-05-04 MED ORDER — FAMOTIDINE 20 MG PO TABS: 20.0000 mg | ORAL_TABLET | Freq: Every day | ORAL | 1 refills | Status: AC

## 2024-05-04 MED ORDER — TRIAMCINOLONE ACETONIDE 0.1 % EX OINT: TOPICAL_OINTMENT | Freq: Two times a day (BID) | CUTANEOUS | 0 refills | Status: AC

## 2024-05-15 ENCOUNTER — Emergency Department (HOSPITAL_COMMUNITY)

## 2024-05-15 ENCOUNTER — Emergency Department (HOSPITAL_COMMUNITY)
Admission: EM | Admit: 2024-05-15 | Discharge: 2024-05-15 | Disposition: A | Discharge status: Home / Self Care | Attending: Emergency Medicine | Admitting: Emergency Medicine

## 2024-05-15 ENCOUNTER — Other Ambulatory Visit: Payer: Self-pay

## 2024-05-15 DIAGNOSIS — M79671 Pain in right foot: Secondary | ICD-10-CM | POA: Diagnosis not present

## 2024-05-15 DIAGNOSIS — Z79899 Other long term (current) drug therapy: Secondary | ICD-10-CM | POA: Insufficient documentation

## 2024-05-15 DIAGNOSIS — M7989 Other specified soft tissue disorders: Secondary | ICD-10-CM | POA: Diagnosis present

## 2024-05-15 DIAGNOSIS — R600 Localized edema: Secondary | ICD-10-CM | POA: Insufficient documentation

## 2024-05-15 DIAGNOSIS — I10 Essential (primary) hypertension: Secondary | ICD-10-CM | POA: Diagnosis not present

## 2024-05-15 DIAGNOSIS — M79672 Pain in left foot: Secondary | ICD-10-CM | POA: Diagnosis not present

## 2024-05-15 DIAGNOSIS — D649 Anemia, unspecified: Secondary | ICD-10-CM | POA: Insufficient documentation

## 2024-05-15 DIAGNOSIS — Z7982 Long term (current) use of aspirin: Secondary | ICD-10-CM | POA: Insufficient documentation

## 2024-05-15 LAB — COMPREHENSIVE METABOLIC PANEL WITH GFR
ALT: 12 U/L (ref 0–44)
AST: 21 U/L (ref 15–41)
Albumin: 3.2 g/dL — ABNORMAL LOW (ref 3.5–5.0)
Alkaline Phosphatase: 86 U/L (ref 38–126)
Anion gap: 9 (ref 5–15)
BUN: 7 mg/dL — ABNORMAL LOW (ref 8–23)
CO2: 26 mmol/L (ref 22–32)
Calcium: 8.7 mg/dL — ABNORMAL LOW (ref 8.9–10.3)
Chloride: 107 mmol/L (ref 98–111)
Creatinine, Ser: 0.79 mg/dL (ref 0.44–1.00)
GFR, Estimated: >60 mL/min (ref >60)
Glucose, Bld: 88 mg/dL (ref 70–99)
Potassium: 4.2 mmol/L (ref 3.5–5.1)
Sodium: 142 mmol/L (ref 135–145)
Total Bilirubin: 0.6 mg/dL (ref 0.0–1.2)
Total Protein: 7.3 g/dL (ref 6.5–8.1)

## 2024-05-15 LAB — URINALYSIS, ROUTINE W REFLEX MICROSCOPIC
Bilirubin Urine: NEGATIVE
Glucose, UA: NEGATIVE mg/dL
Hgb urine dipstick: NEGATIVE
Ketones, ur: NEGATIVE mg/dL
Leukocytes,Ua: NEGATIVE
Nitrite: NEGATIVE
Protein, ur: NEGATIVE mg/dL
Specific Gravity, Urine: 1.016 (ref 1.005–1.030)
pH: 8 (ref 5.0–8.0)

## 2024-05-15 LAB — CBC WITH DIFFERENTIAL/PLATELET
Abs Immature Granulocytes: 0.03 K/uL (ref 0.00–0.07)
Basophils Absolute: 0 K/uL (ref 0.0–0.1)
Basophils Relative: 1 %
Eosinophils Absolute: 0.1 K/uL (ref 0.0–0.5)
Eosinophils Relative: 1 %
HCT: 35.5 % — ABNORMAL LOW (ref 36.0–46.0)
Hemoglobin: 11.3 g/dL — ABNORMAL LOW (ref 12.0–15.0)
Immature Granulocytes: 1 %
Lymphocytes Relative: 35 %
Lymphs Abs: 2.2 K/uL (ref 0.7–4.0)
MCH: 28.1 pg (ref 26.0–34.0)
MCHC: 31.8 g/dL (ref 30.0–36.0)
MCV: 88.3 fL (ref 80.0–100.0)
Monocytes Absolute: 0.4 K/uL (ref 0.1–1.0)
Monocytes Relative: 6 %
Neutro Abs: 3.5 K/uL (ref 1.7–7.7)
Neutrophils Relative %: 56 %
Platelets: 285 K/uL (ref 150–400)
RBC: 4.02 MIL/uL (ref 3.87–5.11)
RDW: 13.4 % (ref 11.5–15.5)
WBC: 6.1 K/uL (ref 4.0–10.5)
nRBC: 0 % (ref 0.0–0.2)

## 2024-05-15 LAB — BRAIN NATRIURETIC PEPTIDE: B Natriuretic Peptide: 58 pg/mL (ref 0.0–100.0)

## 2024-05-15 NOTE — ED Triage Notes (Signed)
 Pt. Reports having bilateral feet and leg swelling, Began last Saturday, having pain in both feet 9/10.  Pt has compression stockings on swelling noted.

## 2024-05-15 NOTE — ED Notes (Signed)
 Unable to obtain labs

## 2024-05-15 NOTE — ED Provider Triage Note (Signed)
 Emergency Medicine Provider Triage Evaluation Note  Megan Miller , a 68 y.o. female  was evaluated in triage.  Pt complains of bilateral symmetric lower extremity edema that she noticed yesterday. No h/o similar. No CP/SOB/Cough/orthopnea. No recent changes to medications. No asymmetric sxs. No numbness/tingling.  Review of Systems  Positive: BL LEE Negative: CP, SOB, cough, orthopnea, asymmetric sxs, numbness/tingling  Physical Exam  BP (!) 153/63 (BP Location: Left Arm)   Pulse 75   Temp 98.9 F (37.2 C)   Resp 16   Ht 5' 1 (1.549 m)   Wt 80.7 kg   SpO2 98%   BMI 33.63 kg/m  Gen:   Awake, no distress   Resp:  Normal effort, CTAB, no crackles/wheezing MSK:   Moves extremities without difficulty; 2+ nonpitting BL LEE up to knees, symmetric  Medical Decision Making  Medically screening exam initiated at 11:19 AM.  Appropriate orders placed.  Megan Miller was informed that the remainder of the evaluation will be completed by another provider, this initial triage assessment does not replace that evaluation, and the importance of remaining in the ED until their evaluation is complete.  Ordered labs including CBC, CMP, BNP, and UA. Patient is very comfortable, well-appearing, and HDS. Labs are drawn in triage. Patient is stable to be moved back to waiting area while treatment space is made available.   Franklyn Sid SAILOR, MD 05/15/24 [phone]

## 2024-05-15 NOTE — ED Provider Notes (Signed)
  EMERGENCY DEPARTMENT AT [facility] Provider Note   CSN: [id number] Arrival date & time: 05/15/24  1005     Patient presents with: Leg Swelling (Leg and feet swelling , began last saturday)   Megan Miller is a 68 y.o. female.   The history is provided by the patient and medical records. No language interpreter was used.  Leg Pain Location:  Leg Time since incident:  1 week Injury: no   Leg location:  L lower leg and R lower leg Pain details:    Quality:  Cramping   Radiates to:  Does not radiate   Severity:  Mild   Onset quality:  Gradual   Progression:  Unchanged Chronicity:  Recurrent Prior injury to area:  No Relieved by:  Nothing Worsened by:  Nothing Ineffective treatments:  None tried Associated symptoms: swelling   Associated symptoms: no back pain, no decreased ROM, no fatigue, no fever, no numbness, no stiffness and no tingling        Prior to Admission medications   Medication Sig Start Date End Date Taking? Authorizing Provider  acetaminophen  (TYLENOL  8 HOUR) 650 MG CR tablet Take 1 tablet (650 mg total) by mouth every 6 (six) hours as needed for pain. 02/24/21   Brimage, Vondra, DO  aspirin  81 MG tablet Take 1 tablet (81 mg total) by mouth daily. 09/23/14   Maribeth Camellia MATSU, MD  atorvastatin  (LIPITOR) 40 MG tablet TAKE 1 TABLET BY MOUTH EVERY DAY 08/01/22   Dameron, Marisa, DO  Blood Pressure Monitoring (ADULT BLOOD PRESSURE CUFF LG) KIT 1 kit by Does not apply route 2 (two) times daily. 05/14/22   Christia Budds, MD  carvedilol  (COREG ) 3.125 MG tablet TAKE 1 TABLET BY MOUTH TWICE A DAY WITH A MEAL 05/04/24   Howell Lunger, DO  diclofenac  Sodium (VOLTAREN ) 1 % GEL Apply 4 g topically 4 (four) times daily. 11/05/23   Christia Budds, MD  Emollient (EUCERIN) lotion Apply topically as needed for dry skin.    [provider]  famotidine  (PEPCID ) 20 MG tablet Take 1 tablet (20 mg total) by mouth daily. 05/04/24   Howell Lunger, DO  fluticasone  (FLONASE ) 50 MCG/ACT nasal spray Place 2 sprays into both nostrils daily. 09/23/23   Christia Budds, MD  gabapentin  (NEURONTIN ) 300 MG capsule TAKE 1 CAPSULE BY MOUTH EVERYDAY AT BEDTIME 03/23/24   Howell Lunger, DO  olopatadine (PATANOL) 0.1 % ophthalmic solution Place 1 drop into both eyes 2 (two) times daily as needed (seasonal allergies/itching).     [provider]  polyethylene glycol powder (GLYCOLAX /MIRALAX ) 17 GM/SCOOP powder Dissolve 17grams in at at least 32 ounces of water or juice daily as needed. 09/21/21   Brimage, Vondra, DO  RABEprazole  (ACIPHEX ) 20 MG tablet Take 1 tablet (20 mg total) by mouth daily. 12/18/23   Orlando Pond, DO  senna (SENOKOT) 8.6 MG TABS tablet Take 2 tablets (17.2 mg total) by mouth daily as needed for mild constipation. 09/21/21   Brimage, Vondra, DO  Skin Protectants, Misc. (EUCERIN) cream Apply topically as needed for dry skin.    [provider]  Soap & Cleansers (NEUTROGENA FACIAL SOAP) BAR Apply 1 each topically in the morning and at bedtime. 11/20/21   Brimage, Vondra, DO  triamcinolone  ointment (KENALOG ) 0.1 % Apply topically 2 (two) times daily. 05/04/24   Howell Lunger, DO  XIIDRA 5 % SOLN Apply 1 drop to eye 2 (two) times daily. 01/13/22   [provider]  Allergies: Percocet [oxycodone -acetaminophen ] and Codeine phosphate    Review of Systems  Constitutional:  Negative for chills, fatigue and fever.  HENT:  Negative for congestion.   Respiratory:  Negative for cough, choking, chest tightness, shortness of breath and wheezing.   Cardiovascular:  Positive for leg swelling. Negative for chest pain and palpitations.  Gastrointestinal:  Negative for abdominal pain.  Genitourinary:  Negative for dysuria and flank pain.  Musculoskeletal:  Negative for back pain and stiffness.  Neurological:  Negative for light-headedness and headaches.  Psychiatric/Behavioral:  Negative for agitation.   All other  systems reviewed and are negative.   Updated Vital Signs BP (!) 154/83   Pulse 64   Temp 97.8 F (36.6 C) (Oral)   Resp 16   Ht 5' 1 (1.549 m)   Wt 80.7 kg   SpO2 100%   BMI 33.63 kg/m   Physical Exam Vitals and nursing note reviewed.  Constitutional:      General: She is not in acute distress.    Appearance: She is well-developed. She is not diaphoretic.  HENT:     Head: Normocephalic and atraumatic.     Right Ear: External ear normal.     Left Ear: External ear normal.     Nose: Nose normal. No congestion or rhinorrhea.     Mouth/Throat:     Mouth: Mucous membranes are dry.     Pharynx: No oropharyngeal exudate.  Eyes:     Conjunctiva/sclera: Conjunctivae normal.     Pupils: Pupils are equal, round, and reactive to light.  Cardiovascular:     Rate and Rhythm: Normal rate.  Pulmonary:     Effort: No respiratory distress.     Breath sounds: No stridor.  Abdominal:     General: There is no distension.     Tenderness: There is no abdominal tenderness. There is no right CVA tenderness, left CVA tenderness, guarding or rebound.  Musculoskeletal:        General: No tenderness.     Cervical back: Normal range of motion and neck supple. No tenderness.     Right lower leg: Edema present.     Left lower leg: Edema present.  Skin:    General: Skin is warm.     Capillary Refill: Capillary refill takes less than 2 seconds.     Coloration: Skin is not pale.     Findings: No erythema or rash.  Neurological:     General: No focal deficit present.     Mental Status: She is alert and oriented to person, place, and time.     Motor: No weakness or abnormal muscle tone.     Deep Tendon Reflexes: Reflexes are normal and symmetric.  Psychiatric:        Mood and Affect: Mood normal.     (all labs ordered are listed, but only abnormal results are displayed) Labs Reviewed  CBC WITH DIFFERENTIAL/PLATELET - Abnormal; Notable for the following components:      Result Value    Hemoglobin 11.3 (*)    HCT 35.5 (*)    All other components within normal limits  COMPREHENSIVE METABOLIC PANEL WITH GFR - Abnormal; Notable for the following components:   BUN 7 (*)    Calcium  8.7 (*)    Albumin 3.2 (*)    All other components within normal limits  URINALYSIS, ROUTINE W REFLEX MICROSCOPIC  BRAIN NATRIURETIC PEPTIDE    EKG: None  Radiology: VAS US  LOWER EXTREMITY VENOUS (DVT) (ONLY [facility] & [facility])  Result Date: 05/15/2024  Lower Venous DVT Study Patient Name:  Megan Miller  Date of Exam:   05/15/2024 Medical Rec #: [id number]           Accession #:    [id number] Date of Birth: 1955/12/01           Patient Gender: F Patient Age:   58 years Exam Location:  [facility] Procedure:      VAS US  LOWER EXTREMITY VENOUS (DVT) Referring Phys: LONNI SAKAI --------------------------------------------------------------------------------  Indications: Edema, and Pain. Known right popliteal cyst  Limitations: Body habitus, poor ultrasound/tissue interface and Edema. Comparison Study: Previous exam on 04/17/2022 was negative for DVT. RLE venous                   reflux on 06/20/2023 was negative for DVT. Performing Technologist: Alberta Lis RVS  Examination Guidelines: A complete evaluation includes B-mode imaging, spectral Doppler, color Doppler, and power Doppler as needed of all accessible portions of each vessel. Bilateral testing is considered an integral part of a complete examination. Limited examinations for reoccurring indications may be performed as noted. The reflux portion of the exam is performed with the patient in reverse Trendelenburg.  +---------+---------------+---------+-----------+----------+--------------+ RIGHT    CompressibilityPhasicitySpontaneityPropertiesThrombus Aging +---------+---------------+---------+-----------+----------+--------------+ CFV      Full           Yes      No                                   +---------+---------------+---------+-----------+----------+--------------+ SFJ      Full                                                        +---------+---------------+---------+-----------+----------+--------------+ FV Prox  Full           Yes      No                                  +---------+---------------+---------+-----------+----------+--------------+ FV Mid   Full                                                        +---------+---------------+---------+-----------+----------+--------------+ FV DistalFull           Yes      No                                  +---------+---------------+---------+-----------+----------+--------------+ PFV      Full           Yes      No                                  +---------+---------------+---------+-----------+----------+--------------+ POP      Full           Yes      No                                  +---------+---------------+---------+-----------+----------+--------------+  PTV      Full                                                        +---------+---------------+---------+-----------+----------+--------------+ PERO     Full                                                        +---------+---------------+---------+-----------+----------+--------------+   +---------+---------------+---------+-----------+----------+--------------+ LEFT     CompressibilityPhasicitySpontaneityPropertiesThrombus Aging +---------+---------------+---------+-----------+----------+--------------+ CFV      Full           Yes      No                                  +---------+---------------+---------+-----------+----------+--------------+ SFJ      Full                                                        +---------+---------------+---------+-----------+----------+--------------+ FV Prox  Full                                                         +---------+---------------+---------+-----------+----------+--------------+ FV Mid   Full           Yes      No                                  +---------+---------------+---------+-----------+----------+--------------+ FV DistalFull           Yes      No                                  +---------+---------------+---------+-----------+----------+--------------+ PFV      Full                                                        +---------+---------------+---------+-----------+----------+--------------+ POP      Full           Yes      No                                  +---------+---------------+---------+-----------+----------+--------------+ PTV      Full                                                        +---------+---------------+---------+-----------+----------+--------------+  PERO     Full                                                        +---------+---------------+---------+-----------+----------+--------------+    Summary: RIGHT: - There is no evidence of deep vein thrombosis in the lower extremity.  - A cystic structure is found in the popliteal fossa. Pulsatile waveforms noted throughout  LEFT: - There is no evidence of deep vein thrombosis in the lower extremity.  - No cystic structure found in the popliteal fossa. Pulsatile waveforms noted throughout.  *See table(s) above for measurements and observations.    Preliminary      Procedures   Medications Ordered in the ED - No data to display                                  Medical Decision Making Amount and/or Complexity of Data Reviewed Labs: ordered.    Megan Miller is a 68 y.o. female with a past medical history significant for hypertension, hyperlipidemia, previous throat, GERD, peripheral edema, and previous hysterectomy who presents with lower extremity edema and discomfort.  According to patient, for the last week or so, she reports has been having edema in both legs left  worse than right as she has had previous right leg swelling in the past.  She does state that she started a new medication ended.  She started Zoloft  about 2 weeks ago.  She reports no trauma and denies any history of blood clots.  She denies any numbness tingling or weakness compared to baseline.  She says that she has used compression stockings before.  She denies any chest pain, shortness breath or cough.  Denies other complaint such as fever, chills, nausea, vomiting, constipation, diarrhea, or urinary change.  On exam, patient has edema in both legs but has intact sensation strength and pulses.  Knees and hips nontender.  Abdomen and chest nontender.  Lungs were clear.  No swelling seen in the arms and she had good pulses and strength and sensation in the upper extremities.  Patient resting comfortably.  Patient had some workup starting in triage including basic labs and urinalysis.  Her initial workup showed a normal kidney function, normal liver function, and her CBC shows no leukocytosis and very minimal anemia.  Her BNP was normal at 58 and her urinalysis did not show evidence of infection or significant protein in the urine.  Due to the swelling ultrasounds were ordered and the preliminary review from the ultrasound tech described no evidence of DVT.  Clinically I suspect her symptoms may be a side effect of her new medication and patient agrees.  Will have her call her PCP tomorrow to discuss changing medications but otherwise do not see evidence of DVT, no evidence of infection, and no evidence of heart failure or kidney dysfunction causing this dependent peripheral edema.  Anticipate discharge home shortly for outpatient follow-up.  6:38 PM Patient agrees with workup and will call her team next week.  She otherwise no concerns and was discharged in good condition.      Final diagnoses:  Peripheral edema  Leg swelling    ED Discharge Orders     None       Clinical  Impression:  1. Peripheral edema   2. Leg swelling     Disposition: Discharge  Condition: Good  I have discussed the results, Dx and Tx plan with the pt(& family if present). He/she/they expressed understanding and agree(s) with the plan. Discharge instructions discussed at great length. Strict return precautions discussed and pt &/or family have verbalized understanding of the instructions. No further questions at time of discharge.    New Prescriptions   No medications on file    Follow Up: [facility] Emergency Department at [facility] [address] [place]  [phone]    your PCP      .    Masaki Rothbauer, Lonni PARAS, MD 05/15/24 [phone]

## 2024-05-15 NOTE — Discharge Instructions (Signed)
 Your history, exam, workup today did not show evidence of a blood clot, kidney dysfunction, or heart failure causing your swelling your legs.  As you started a new medication recently I suspect it may be related to this.  There is a very small amount of patients who have a side effect of edema with the Zoloft  that you started so I would recommend calling your primary doctor next week to discuss changing medications or adjustment.  Otherwise please keep your feet elevated with toes above your nose and use compression stockings and rest.  If any symptoms change or worsen acutely, return to the nearest emergency department.

## 2024-08-21 ENCOUNTER — Ambulatory Visit: Admitting: Physician Assistant

## 2024-09-03 ENCOUNTER — Ambulatory Visit: Admitting: Family Medicine

## 2024-09-03 ENCOUNTER — Ambulatory Visit: Payer: Self-pay | Admitting: Family Medicine

## 2024-09-03 ENCOUNTER — Encounter: Payer: Self-pay | Admitting: Family Medicine

## 2024-09-03 VITALS — BP 158/82 | HR 83 | Ht 61.0 in | Wt 178.6 lb

## 2024-09-03 DIAGNOSIS — M25561 Pain in right knee: Secondary | ICD-10-CM

## 2024-09-03 DIAGNOSIS — H8122 Vestibular neuronitis, left ear: Secondary | ICD-10-CM

## 2024-09-03 DIAGNOSIS — R350 Frequency of micturition: Secondary | ICD-10-CM

## 2024-09-03 LAB — POCT URINALYSIS DIP (MANUAL ENTRY)
Bilirubin, UA: NEGATIVE
Blood, UA: NEGATIVE
Glucose, UA: NEGATIVE mg/dL
Ketones, POC UA: NEGATIVE mg/dL
Leukocytes, UA: NEGATIVE
Nitrite, UA: NEGATIVE
Protein Ur, POC: 30 mg/dL — AB
Spec Grav, UA: 1.025
Urobilinogen, UA: 2 U/dL — AB
pH, UA: 7.5

## 2024-09-03 MED ORDER — IBUPROFEN 600 MG PO TABS: 600.0000 mg | ORAL_TABLET | Freq: Three times a day (TID) | ORAL | 0 refills | Status: AC | PRN

## 2024-09-03 NOTE — Patient Instructions (Addendum)
 It was wonderful to see you today!  Please keep the diary of your urinary frequency for the next three days and bring it back with you to your appointment. In the meantime, I will watch for the results of your urine test and call you if there are any signs of infection.   You will follow up with your regular doctor on Monday.  Please call [phone] with any questions about today's appointment.   If you need any additional refills, please call your pharmacy before calling the office.  Lucie Pinal, DO Family Medicine

## 2024-09-03 NOTE — Progress Notes (Signed)
" ° ° °  SUBJECTIVE:   CHIEF COMPLAINT / HPI:   Dizziness - for one week with laying down or standing - no associated n/v - no loss of balance - no recent illnesses or injuries  Urinary Frequency - feels like she needs to pee again immediately after going - present for 1-2 months.  - denies any leakage with laughing, coughing, sneezing or bearing down - no sensation of prolapse - has had one episode where she needed to pee and didn't make it to the restroom.  PERTINENT  PMH / PSH: CVA, vestibular neuritis  OBJECTIVE:   BP (!) 152/78   Pulse 83   Ht 5' 1 (1.549 m)   Wt 178 lb 9.6 oz (81 kg)   SpO2 98%   BMI 33.75 kg/m   General: A&O, NAD Cardiac: RRR, no m/r/g Respiratory: CTAB, normal WOB, no w/c/r GU: Normal appearing external female genitalia. Minimally atrophic vaginal mucosa. No obvious pelvic organ prolapse with valsalva, nor urethral leakage.   Dix Hallpike maneuver: unsatisfactory HINTS exam: reassuring for peripheral cause  Neuro: CN 2-12 intact bilaterally. Appropriate strength and sensation. Intact judgement and recall.   Bladder scan: 46 ml  ASSESSMENT/PLAN:   Assessment & Plan Urinary frequency - uncertain cause, patient does not appear to be retaining - provided bathroom diary for patient to log over the next 3 days to track urination and fluid intake - ordered UA today- no sign of infection  - follow up on Monday with PCP to review log, may need to restrict water intake.  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    Megan Pinal, DO [facility] "

## 2024-09-03 NOTE — Assessment & Plan Note (Signed)
-   possibly a recurrence given history and HINTs exam - provided reassurance - patient is considering trial of vestibular PT if dizziness persists.

## 2024-09-07 ENCOUNTER — Ambulatory Visit: Payer: Self-pay | Admitting: Student

## 2024-09-15 ENCOUNTER — Ambulatory Visit: Admitting: Emergency Medicine
